# Patient Record
Sex: Female | Born: 1965 | Race: Black or African American | Hispanic: No | Marital: Married | Smoking: Never smoker
Health system: Southern US, Community
[De-identification: ages and names within clinical notes are randomized; demographics above are authoritative.]

## PROBLEM LIST (undated history)

## (undated) DIAGNOSIS — K219 Gastro-esophageal reflux disease without esophagitis: Secondary | ICD-10-CM

## (undated) DIAGNOSIS — M069 Rheumatoid arthritis, unspecified: Secondary | ICD-10-CM

## (undated) DIAGNOSIS — L309 Dermatitis, unspecified: Secondary | ICD-10-CM

## (undated) DIAGNOSIS — T8859XA Other complications of anesthesia, initial encounter: Secondary | ICD-10-CM

## (undated) DIAGNOSIS — R112 Nausea with vomiting, unspecified: Secondary | ICD-10-CM

## (undated) DIAGNOSIS — R1319 Other dysphagia: Secondary | ICD-10-CM

## (undated) DIAGNOSIS — Z9889 Other specified postprocedural states: Secondary | ICD-10-CM

## (undated) DIAGNOSIS — D649 Anemia, unspecified: Secondary | ICD-10-CM

## (undated) DIAGNOSIS — T4145XA Adverse effect of unspecified anesthetic, initial encounter: Secondary | ICD-10-CM

## (undated) DIAGNOSIS — J45909 Unspecified asthma, uncomplicated: Secondary | ICD-10-CM

## (undated) DIAGNOSIS — I10 Essential (primary) hypertension: Secondary | ICD-10-CM

## (undated) HISTORY — DX: Gastro-esophageal reflux disease without esophagitis: K21.9

## (undated) HISTORY — PX: ABDOMINAL HYSTERECTOMY: SHX81

## (undated) HISTORY — DX: Rheumatoid arthritis, unspecified: M06.9

## (undated) HISTORY — PX: COLONOSCOPY: SHX174

## (undated) HISTORY — DX: Essential (primary) hypertension: I10

## (undated) HISTORY — PX: TUBAL LIGATION: SHX77

## (undated) HISTORY — DX: Anemia, unspecified: D64.9

## (undated) HISTORY — DX: Unspecified asthma, uncomplicated: J45.909

## (undated) HISTORY — PX: TONSILLECTOMY: SUR1361

## (undated) HISTORY — PX: ESOPHAGEAL DILATION: SHX303

## (undated) HISTORY — DX: Dermatitis, unspecified: L30.9

## (undated) HISTORY — DX: Other dysphagia: R13.19

---

## 1997-10-29 ENCOUNTER — Ambulatory Visit (HOSPITAL_COMMUNITY): Admission: RE | Admit: 1997-10-29 | Discharge: 1997-10-30 | Payer: Self-pay | Admitting: *Deleted

## 1999-02-20 ENCOUNTER — Emergency Department (HOSPITAL_COMMUNITY): Admission: EM | Admit: 1999-02-20 | Discharge: 1999-02-20 | Payer: Self-pay | Admitting: Emergency Medicine

## 2000-03-31 ENCOUNTER — Emergency Department (HOSPITAL_COMMUNITY): Admission: EM | Admit: 2000-03-31 | Discharge: 2000-03-31 | Payer: Self-pay | Admitting: Emergency Medicine

## 2000-04-01 ENCOUNTER — Encounter: Payer: Self-pay | Admitting: Emergency Medicine

## 2001-08-25 ENCOUNTER — Other Ambulatory Visit: Admission: RE | Admit: 2001-08-25 | Discharge: 2001-08-25 | Payer: Self-pay | Admitting: Internal Medicine

## 2004-05-24 ENCOUNTER — Emergency Department (HOSPITAL_COMMUNITY): Admission: EM | Admit: 2004-05-24 | Discharge: 2004-05-24 | Payer: Self-pay | Admitting: Family Medicine

## 2004-11-15 ENCOUNTER — Emergency Department (HOSPITAL_COMMUNITY): Admission: EM | Admit: 2004-11-15 | Discharge: 2004-11-15 | Payer: Self-pay | Admitting: Family Medicine

## 2004-11-19 ENCOUNTER — Emergency Department (HOSPITAL_COMMUNITY): Admission: EM | Admit: 2004-11-19 | Discharge: 2004-11-19 | Payer: Self-pay | Admitting: Family Medicine

## 2005-04-29 ENCOUNTER — Emergency Department (HOSPITAL_COMMUNITY): Admission: EM | Admit: 2005-04-29 | Discharge: 2005-04-29 | Payer: Self-pay | Admitting: Emergency Medicine

## 2005-07-06 ENCOUNTER — Encounter (INDEPENDENT_AMBULATORY_CARE_PROVIDER_SITE_OTHER): Payer: Self-pay | Admitting: Specialist

## 2005-07-06 ENCOUNTER — Ambulatory Visit (HOSPITAL_COMMUNITY): Admission: RE | Admit: 2005-07-06 | Discharge: 2005-07-07 | Payer: Self-pay | Admitting: Otolaryngology

## 2005-09-09 ENCOUNTER — Emergency Department (HOSPITAL_COMMUNITY): Admission: EM | Admit: 2005-09-09 | Discharge: 2005-09-09 | Payer: Self-pay | Admitting: Family Medicine

## 2006-08-04 ENCOUNTER — Emergency Department (HOSPITAL_COMMUNITY): Admission: EM | Admit: 2006-08-04 | Discharge: 2006-08-04 | Payer: Self-pay | Admitting: Emergency Medicine

## 2007-08-10 ENCOUNTER — Emergency Department (HOSPITAL_COMMUNITY): Admission: EM | Admit: 2007-08-10 | Discharge: 2007-08-10 | Payer: Self-pay | Admitting: Family Medicine

## 2007-11-12 ENCOUNTER — Encounter: Admission: RE | Admit: 2007-11-12 | Discharge: 2007-11-12 | Payer: Self-pay | Admitting: Internal Medicine

## 2008-11-15 ENCOUNTER — Encounter: Admission: RE | Admit: 2008-11-15 | Discharge: 2008-11-15 | Payer: Self-pay | Admitting: Internal Medicine

## 2008-11-16 ENCOUNTER — Inpatient Hospital Stay (HOSPITAL_COMMUNITY): Admission: EM | Admit: 2008-11-16 | Discharge: 2008-11-18 | Payer: Self-pay | Admitting: Family Medicine

## 2008-11-16 ENCOUNTER — Ambulatory Visit: Payer: Self-pay | Admitting: Internal Medicine

## 2008-11-17 ENCOUNTER — Encounter: Payer: Self-pay | Admitting: Internal Medicine

## 2008-11-25 ENCOUNTER — Telehealth: Payer: Self-pay | Admitting: Internal Medicine

## 2009-03-18 ENCOUNTER — Ambulatory Visit (HOSPITAL_COMMUNITY): Admission: RE | Admit: 2009-03-18 | Discharge: 2009-03-18 | Payer: Self-pay | Admitting: Interventional Radiology

## 2009-11-16 ENCOUNTER — Encounter: Admission: RE | Admit: 2009-11-16 | Discharge: 2009-11-16 | Payer: Self-pay | Admitting: Internal Medicine

## 2009-12-02 ENCOUNTER — Encounter: Admission: RE | Admit: 2009-12-02 | Discharge: 2010-03-02 | Payer: Self-pay | Admitting: Otolaryngology

## 2010-09-18 ENCOUNTER — Emergency Department (HOSPITAL_COMMUNITY)
Admission: EM | Admit: 2010-09-18 | Discharge: 2010-09-18 | Payer: Self-pay | Source: Home / Self Care | Admitting: Family Medicine

## 2010-10-07 ENCOUNTER — Other Ambulatory Visit: Payer: Self-pay | Admitting: Internal Medicine

## 2010-10-07 DIAGNOSIS — Z1239 Encounter for other screening for malignant neoplasm of breast: Secondary | ICD-10-CM

## 2010-10-17 NOTE — Progress Notes (Signed)
Summary: TRIAGE  Phone Note Call from Patient Call back at [phone]   Caller: Patient Call For: [name] Reason for Call: Talk to Nurse Summary of Call: Patient would like col results done last week at the hospital Initial call taken by: Tawni Levy,  November 25, 2008 11:46 AM  Follow-up for Phone Call        Colonoscopy and Path. report reviewed w/pt. Pt. requests I send a copy to her PCP, Dr.Shelton and OGBYN, Dr.Cousins. I will fax those. Pt. instructed to call back as needed.  Follow-up by: Laureen Ochs LPN,  November 25, 2008 1:08 PM

## 2010-10-17 NOTE — Procedures (Signed)
Summary: Colonoscopy/BX.   Colonoscopy  Procedure date:  11/17/2008  Findings:      Location:  [facility].    COLONOSCOPY PROCEDURE REPORT  PATIENT:  Julie [name], Julie [name]  MR#:  [id number] BIRTHDATE:   Feb 15, 1966, 42 yrs. old   GENDER:   female  ENDOSCOPIST:   Hedwig Morton. Juanda Chance, MD Referred by:   PROCEDURE DATE:  11/17/2008 PROCEDURE:  Colonoscopy with biopsy ASA CLASS:   Class I INDICATIONS: abdominal pain, abnormal CT of abdomen   MEDICATIONS:    Versed 10 mg, Fentanyl 100 mcg  DESCRIPTION OF PROCEDURE:   After the risks benefits and alternatives of the procedure were thoroughly explained, informed consent was obtained.  Digital rectal exam was performed and revealed no rectal masses.   The EC-[id number] ([id number]) endoscope was introduced through the anus and advanced to the terminal ileum which was intubated for a short distance, without limitations.  The quality of the prep was excellent, using MiraLax.  The instrument was then slowly withdrawn as the colon was fully examined. <<PROCEDUREIMAGES>>                <<OLD IMAGES>>  FINDINGS:  The terminal ileum appeared normal. normal TI mucosa, no evidence of inflammation, about 15 cm examined With standard forceps, biopsy was obtained and sent to pathology (see image005 and image006).  A diminutive polyp was found in the sigmoid colon. diminutive polyp at 20 cm removed With standard forceps, biopsy was obtained and sent to pathology (see image009 and image008).  This was otherwise a normal examination (see image010, image007, image003, image002, and image001).   Retroflexed views in the rectum revealed no abnormalities.    The scope was then withdrawn from the patient and the procedure completed.  COMPLICATIONS:   None  ENDOSCOPIC IMPRESSION:  1) Normal terminal ileum  2) Diminutive polyp in the sigmoid colon  3) Otherwise normal examination  s/p biopsires from the normal appearing Terminal ileum  no evidence of Crohn's  disease RECOMMENDATIONS:  continue antibiotics,  pelvic ultrasound  REPEAT EXAM:   In 10 year(s) for.   _______________________________ Hedwig Morton. Juanda Chance, MD  CC:  This report was created from the original endoscopy report, which was reviewed and signed by the above listed endoscopist.     Appended Document: Colonoscopy/BX. Recall is in IDX for 11/2018.

## 2010-11-20 ENCOUNTER — Ambulatory Visit
Admission: RE | Admit: 2010-11-20 | Discharge: 2010-11-20 | Disposition: A | Discharge status: Home / Self Care | Payer: 59 | Source: Ambulatory Visit | Attending: Internal Medicine | Admitting: Internal Medicine

## 2010-11-20 DIAGNOSIS — Z1239 Encounter for other screening for malignant neoplasm of breast: Secondary | ICD-10-CM

## 2010-12-06 ENCOUNTER — Inpatient Hospital Stay (INDEPENDENT_AMBULATORY_CARE_PROVIDER_SITE_OTHER)
Admission: RE | Admit: 2010-12-06 | Discharge: 2010-12-06 | Disposition: A | Discharge status: Home / Self Care | Payer: 59 | Source: Ambulatory Visit

## 2010-12-06 DIAGNOSIS — J019 Acute sinusitis, unspecified: Secondary | ICD-10-CM

## 2010-12-28 LAB — CBC
HCT: 31.2 % — ABNORMAL LOW (ref 36.0–46.0)
HCT: 33.3 % — ABNORMAL LOW (ref 36.0–46.0)
HCT: 33.7 % — ABNORMAL LOW (ref 36.0–46.0)
HCT: 38.7 % (ref 36.0–46.0)
Hemoglobin: 11.1 g/dL — ABNORMAL LOW (ref 12.0–15.0)
Hemoglobin: 11.5 g/dL — ABNORMAL LOW (ref 12.0–15.0)
Hemoglobin: 11.8 g/dL — ABNORMAL LOW (ref 12.0–15.0)
Hemoglobin: 13.5 g/dL (ref 12.0–15.0)
MCHC: 34.6 g/dL (ref 30.0–36.0)
MCHC: 34.9 g/dL (ref 30.0–36.0)
MCHC: 35 g/dL (ref 30.0–36.0)
MCHC: 35.4 g/dL (ref 30.0–36.0)
MCV: 83.6 fL (ref 78.0–100.0)
MCV: 84.9 fL (ref 78.0–100.0)
MCV: 84.9 fL (ref 78.0–100.0)
MCV: 86.1 fL (ref 78.0–100.0)
Platelets: 171 10*3/uL (ref 150–400)
Platelets: 178 10*3/uL (ref 150–400)
Platelets: 182 10*3/uL (ref 150–400)
Platelets: 201 10*3/uL (ref 150–400)
RBC: 3.74 MIL/uL — ABNORMAL LOW (ref 3.87–5.11)
RBC: 3.88 MIL/uL (ref 3.87–5.11)
RBC: 3.97 MIL/uL (ref 3.87–5.11)
RBC: 4.56 MIL/uL (ref 3.87–5.11)
RDW: 11.9 % (ref 11.5–15.5)
RDW: 11.9 % (ref 11.5–15.5)
RDW: 12.1 % (ref 11.5–15.5)
RDW: 12.4 % (ref 11.5–15.5)
WBC: 4.2 10*3/uL (ref 4.0–10.5)
WBC: 5.4 10*3/uL (ref 4.0–10.5)
WBC: 5.4 10*3/uL (ref 4.0–10.5)
WBC: 6.9 10*3/uL (ref 4.0–10.5)

## 2010-12-28 LAB — WET PREP, GENITAL
Clue Cells Wet Prep HPF POC: NONE SEEN
Trich, Wet Prep: NONE SEEN
WBC, Wet Prep HPF POC: NONE SEEN
Yeast Wet Prep HPF POC: NONE SEEN

## 2010-12-28 LAB — COMPREHENSIVE METABOLIC PANEL
ALT: 10 U/L (ref 0–35)
AST: 13 U/L (ref 0–37)
Albumin: 3.1 g/dL — ABNORMAL LOW (ref 3.5–5.2)
Alkaline Phosphatase: 48 U/L (ref 39–117)
BUN: 5 mg/dL — ABNORMAL LOW (ref 6–23)
CO2: 23 mEq/L (ref 19–32)
Calcium: 8.3 mg/dL — ABNORMAL LOW (ref 8.4–10.5)
Chloride: 109 mEq/L (ref 96–112)
Creatinine, Ser: 0.67 mg/dL (ref 0.4–1.2)
GFR calc Af Amer: >60 mL/min (ref >60)
GFR calc non Af Amer: >60 mL/min (ref >60)
Glucose, Bld: 109 mg/dL — ABNORMAL HIGH (ref 70–99)
Potassium: 3.8 mEq/L (ref 3.5–5.1)
Sodium: 137 mEq/L (ref 135–145)
Total Bilirubin: 0.4 mg/dL (ref 0.3–1.2)
Total Protein: 6.1 g/dL (ref 6.0–8.3)

## 2010-12-28 LAB — BASIC METABOLIC PANEL
BUN: 7 mg/dL (ref 6–23)
CO2: 23 mEq/L (ref 19–32)
Calcium: 7.9 mg/dL — ABNORMAL LOW (ref 8.4–10.5)
Chloride: 106 mEq/L (ref 96–112)
Creatinine, Ser: 0.64 mg/dL (ref 0.4–1.2)
GFR calc Af Amer: >60 mL/min (ref >60)
GFR calc non Af Amer: >60 mL/min (ref >60)
Glucose, Bld: 98 mg/dL (ref 70–99)
Potassium: 3.6 mEq/L (ref 3.5–5.1)
Sodium: 133 mEq/L — ABNORMAL LOW (ref 135–145)

## 2010-12-28 LAB — DIFFERENTIAL
Basophils Absolute: 0 10*3/uL (ref 0.0–0.1)
Basophils Relative: 0 % (ref 0–1)
Eosinophils Absolute: 0.2 10*3/uL (ref 0.0–0.7)
Eosinophils Relative: 3 % (ref 0–5)
Lymphocytes Relative: 27 % (ref 12–46)
Lymphs Abs: 1.9 10*3/uL (ref 0.7–4.0)
Monocytes Absolute: 0.4 10*3/uL (ref 0.1–1.0)
Monocytes Relative: 6 % (ref 3–12)
Neutro Abs: 4.4 10*3/uL (ref 1.7–7.7)
Neutrophils Relative %: 64 % (ref 43–77)

## 2010-12-28 LAB — POCT I-STAT, CHEM 8
BUN: 10 mg/dL (ref 6–23)
Calcium, Ion: 1.09 mmol/L — ABNORMAL LOW (ref 1.12–1.32)
Chloride: 105 mEq/L (ref 96–112)
Creatinine, Ser: 0.9 mg/dL (ref 0.4–1.2)
Glucose, Bld: 100 mg/dL — ABNORMAL HIGH (ref 70–99)
HCT: 40 % (ref 36.0–46.0)
Hemoglobin: 13.6 g/dL (ref 12.0–15.0)
Potassium: 4.5 mEq/L (ref 3.5–5.1)
Sodium: 137 mEq/L (ref 135–145)
TCO2: 25 mmol/L (ref 0–100)

## 2010-12-28 LAB — URINALYSIS, ROUTINE W REFLEX MICROSCOPIC
Bilirubin Urine: NEGATIVE
Glucose, UA: NEGATIVE mg/dL
Hgb urine dipstick: NEGATIVE
Ketones, ur: NEGATIVE mg/dL
Nitrite: NEGATIVE
Protein, ur: NEGATIVE mg/dL
Specific Gravity, Urine: 1.009 (ref 1.005–1.030)
Urobilinogen, UA: 0.2 mg/dL (ref 0.0–1.0)
pH: 7.5 (ref 5.0–8.0)

## 2010-12-28 LAB — T4, FREE: Free T4: 0.92 ng/dL (ref 0.89–1.80)

## 2010-12-28 LAB — VITAMIN C: Vitamin C: 0.7 mg/dL (ref 0.4–2.0)

## 2010-12-28 LAB — ANTI-NEUTROPHIL ANTIBODY

## 2010-12-28 LAB — GC/CHLAMYDIA PROBE AMP, GENITAL
Chlamydia, DNA Probe: NEGATIVE
GC Probe Amp, Genital: NEGATIVE

## 2010-12-28 LAB — TSH: TSH: 1.484 u[IU]/mL (ref 0.350–4.500)

## 2010-12-28 LAB — POCT PREGNANCY, URINE: Preg Test, Ur: NEGATIVE

## 2010-12-28 LAB — SEDIMENTATION RATE: Sed Rate: 14 mm/hr (ref 0–22)

## 2010-12-28 LAB — HIGH SENSITIVITY CRP: CRP, High Sensitivity: 13.7 mg/L — ABNORMAL HIGH

## 2011-01-30 NOTE — Discharge Summary (Signed)
NAME:  [name], Julie             ACCOUNT NO.:  [id number]   MEDICAL RECORD NO.:  [id number]          PATIENT TYPE:  INP   LOCATION:  6732                         FACILITY:  [facility]   PHYSICIAN:  Richarda Overlie, MD       DATE OF BIRTH:  September 15, 1966   DATE OF ADMISSION:  11/15/2008  DATE OF DISCHARGE:  11/18/2008                               DISCHARGE SUMMARY   DISCHARGE DIAGNOSES:  1. Abdominal pain likely viral gastroenteritis.  2. Ruled out for inflammatory bowel disease.  3. Uterine fibroids.  4. Allergic rhinitis.  5. Gastroesophageal reflux disease.  6. Rheumatoid arthritis.   DIAGNOSTIC STUDIES DONE:  1. Colonoscopy by Dr. Lina Sar showed a normal terminal ileum.      biopsies taken and pending.  Retroflexed views of the rectum reveal      no abnormalities.  2. Non-OB transvaginal ultrasound shows a left fundal uterine fibroid      3.3 cm in diameter, no suspicious adnexal findings, small      collapsing left ovarian follicle noted.  3. Ultrasound of the pelvis complete shows the uterine fibroid.  4. CT scan of the abdomen and pelvis shows thickening of the walls of      the terminal ileum extending onto the cecum and most compatible      with infectious or inflammatory process such as Crohn's disease;      neoplastic is felt less likely but colon cancer screening after the      patient's acute episode is recommended.  Abnormal appearance of the      terminal ileum and ileocecal valve as detailed above.  No      appendicitis noted.   SUBJECTIVE:  This is a 45 year old female with a history of rheumatoid  arthritis who presents to the ER with chief complaint of abdominal pain  for the last several days associated in the epigastrium and right upper  quadrant.  She denied any associated fever, chills or rigors.  Because  of the appearance on the CAT scan the patient was worked up for  inflammatory bowel disease.  Her Crp was elevated but the ESR was  normal.  CPR was  elevated at 13.7.  The patient did not have any  evidence of diarrhea therefore no stool studies were sent.  The patient  was evaluated by GI for possible inflammatory bowel disease and had a  colonoscopy with the above findings and was essentially negative  therefore she was subsequently evaluated for gynecologic reasons for  abdominal pain and was found to have a 3.3 cm uterine fibroid.  The  patient is advised to schedule a followup appointment with her  gynecologist next week.   Rheumatoid arthritis has remained stable and controlled during this  admission.   DISCHARGE MEDICATIONS:  1. Symbicort 150/4.5 two puffs inhaled q.12 hours.  2. Folate 1 mg p.o. daily.  3. Omeprazole 20 mg p.o. daily.  4. Nasonex two sprays per nostril 1-2 p.r.n. congestion.  5. Albuterol 1-2 inhalations q.6 hours p.r.n. wheezing.  6. Plaquenil 200 mg one p.o. b.i.d.  7. Kapidex 60 mg p.o. b.i.d.  8. Vitamin D 50,000 units one capsule 2 times a week x3 months.  9. Reglan 10 mg p.o. q.6 hours p.r.n. nausea.   FOLLOWUP:  The patient is to follow up with PCP in 5-7 days, follow up  with gynecology in 5-7 days.      Richarda Overlie, MD  Electronically Signed     NA/MEDQ  D:  11/18/2008  T:  11/18/2008  Job:  [id number]

## 2011-01-30 NOTE — H&P (Signed)
NAME:  [name], Julie             ACCOUNT NO.:  [id number]   MEDICAL RECORD NO.:  [id number]          PATIENT TYPE:  INP   LOCATION:  6732                         FACILITY:  [facility]   PHYSICIAN:  Renee Ramus, MD       DATE OF BIRTH:  [birth year]   DATE OF ADMISSION:  11/15/2008  DATE OF DISCHARGE:                              HISTORY & PHYSICAL   HISTORY OF PRESENT ILLNESS:  The patient is a 45 year old mildly obese  female with abdominal pain times several days.  Prior to admission,  the patient rates her pain currently at 10/10, says it is the worst  pain ever she had.  The patient describes her pain in her right upper  quadrant and epigastrium.  She denies any previous history of this pain.  She denies fevers, chills, night sweats, nausea, vomiting, diarrhea, or  constipation.  The patient is being admitted for further evaluation and  treatment.   PAST MEDICAL HISTORY:  1. Gastroesophageal reflux disease.  2. Obesity.  3. Seasonal allergies.  4. Rheumatoid arthritis.   SOCIAL HISTORY:  The patient has no tobacco or alcohol history.   FAMILY HISTORY:  Not available.   REVIEW OF SYSTEMS:  All other comprehensive review systems are negative.   MEDICATIONS:  The patient lists allergies to AUGMENTIN.   CURRENT MEDICATIONS:  1. Symbicort 160/4.5 two puffs inhaled q.12 h.  2. Folate 1 mg p.o. daily.  3. Omeprazole 20 mg p.o. daily.  4. Nasonex 2 sprays per nostril 1-2 a day p.r.n. congestion.  5. Albuterol 1-2 inhalations q.6 h. p.r.n. wheezes.  6. Plaquenil 200 mg 1 p.o. b.i.d.  7. Kapidex 60 mg p.o. b.i.d.  8. Vitamin D 50,000 units 1 cap 2 times a week x3 months.  The patient      has completed this regimen.   PHYSICAL EXAMINATION:  GENERAL:  This is a well-developed, well-  nourished [place] female, currently in no apparent distress.  VITAL SIGNS:  Temperature 98.5, pulse 88, respiratory rate 18, blood  pressure 122/66, she is 97% on room air.  HEENT:  No  jugular venous distention or lymphadenopathy.  Oropharynx is  clear.  Mucous membranes pink and moist.  TMs clear bilaterally.  Pupils  equal and reactive to light and accommodation.  Extraocular muscles are  intact.  CARDIOVASCULAR:  Regular rate and rhythm without murmurs, rubs, or  gallops.  PULMONARY:  Lungs are clear to auscultation bilaterally.  ABDOMEN:  Tender to palpation, but no rebound or guarding.  She has good  bowel sounds.  ABDOMEN:  Soft.  EXTREMITIES:  No clubbing, cyanosis, or edema.  She has good peripheral  pulses, dorsalis pedis, and radial arteries.  She is able to move all  extremities.  NEURO:  Cranial nerves II-XII are grossly intact.  She has no focal  neurological deficits.   LABORATORY DATA:  White count 6.9, H&H 13.6 and 40, MCV 85, and  platelets 201.  Sodium 137, potassium 4.5, chloride 105, bicarb 25, BUN  10, creatinine 0.9, and glucose 100.  UA is clear of infection and is  not concentrated.  STUDIES:  Abdominal CT scan showing thickening of the terminal ileum and  cecum with question of Crohn disease versus inflammatory process.   ASSESSMENT/PLAN:  1. Abdominal pain, question of inflammatory bowel disease.  The      patient does not have diarrhea, no white count, but she is on      Plaquenil, and this could cause her difficulty in mounting white      count.  We will obtain GI consult, and for further evaluation and      possible treatment, the patient will need biopsy to definitively      diagnose Crohn disease.  We will check pANCA, CRP, ESR, and ASCA.  2. Allergic rhinitis.  The patient is on Symbicort and Nasonex.  She      is not currently using this and has no symptoms of this right now.  3. Gastroesophageal reflux disease.  We will continue proton pump      inhibitor.  4. Mild obesity.  We will check thyroid including a TSH and free T4.  5. Rheumatoid arthritis.  We will continue Plaquenil and folate.  6. Disposition.  The patient is full  code.   H&P was constructed by reviewing past medical history, conferring with  emergency medical room physician, and reviewing the emergency medical  record.  Time spent 1 hour.      Renee Ramus, MD  Electronically Signed     JF/MEDQ  D:  11/16/2008  T:  11/17/2008  Job:  [id number]   cc:   Merlene Laughter. Renae Gloss, M.D.

## 2011-02-02 NOTE — Op Note (Signed)
NAME:  Julie [name], Julie [name]             ACCOUNT NO.:  [id number]   MEDICAL RECORD NO.:  [id number]          PATIENT TYPE:  OIB   LOCATION:  3315                         FACILITY:  [facility]   PHYSICIAN:  Jefry H. Pollyann Kennedy, MD     DATE OF BIRTH:  08/27/1966   DATE OF PROCEDURE:  07/06/2005  DATE OF DISCHARGE:                                 OPERATIVE REPORT   PREOPERATIVE DIAGNOSES:  1.  Obstructive sleep apnea syndrome.  2.  Inability to tolerate continuous positive airway pressure.  3.  Nasal septal deviation.  4.  Tonsil hypertrophy.  5.  Inferior turbinate hypertrophy.   POSTOPERATIVE DIAGNOSES:  1.  Obstructive sleep apnea syndrome.  2.  Inability to tolerate continuous positive airway pressure.  3.  Nasal septal deviation.  4.  Tonsil hypertrophy.  5.  Inferior turbinate hypertrophy.   PROCEDURE:  1.  Tonsillectomy.  2.  Uvulopalatopharyngoplasty.  3.  Nasal septoplasty.  4.  Submucous resection, inferior turbinates, bilateral.   SURGEON:  Jefry H. Pollyann Kennedy, MD   ANESTHESIA:  General endotracheal anesthesia was used.   COMPLICATIONS:  No complications.   FINDINGS:  Moderately large tonsils with redundant soft tissue involving the  anterior fossal arches and the free edge of the soft palate and uvula.  Marked leftward nasal septal deviation with bilateral turbinate enlargement,  thickened bone and inferior meatus obstruction.   REFERRING PHYSICIAN:  Kellie Shropshire, MD   BLOOD LOSS:  30 mL.   HISTORY:  This is a 45 year old who was diagnosed with obstructive sleep  apnea syndrome and underwent a trial of CPAP, but was unable to tolerate  that.  Risks, benefits, alternatives and complications of the procedure were  explained to the patient, who seemed to understand and agreed to surgery.   PROCEDURE:  The patient was taken to the operating room and placed on the  operating table in a supine position.  Following induction of general  endotracheal anesthesia, the table was turned  and the patient was draped in  a standard fashion.   1. TONSILLECTOMY:  A Crowe-Davis mouth gag was inserted into the oral cavity  and used to retract the tongue and mandible, and attached to the Mayo stand.  A tonsillectomy was performed using electrocautery dissection, first  incising and excising excess anterior fossal arch mucosa and submucosal  muscle along with the tonsils, dissecting in the avascular plane between the  capsule and constrictor muscles.  Multiple bleeders were encountered and  were cauterized with the suction cautery unit.   2. UVULOPALATOPHARYNGOPLASTY:  Proposed incisions were marked on the  anterior surface of the soft palate mucosa with electrocautery.  Sufficient  mucosa and muscle were left remaining for prevention of postoperative  velopharyngeal insufficiency.  The mucosa was incised through the muscular  layer including the uvula and through the posterior mucosa, slightly lower  than the anterior mucosa to facilitate closure.  The tonsils and UP-3  specimen were sent together for pathologic evaluation.  Cautery was used as  needed for control of any bleeding.  The mucosa was reapproximated starting  by tacking up the corners bilaterally  with interrupted 3-0 Vicryl.  The  lateral wound defects were reapproximated with several interrupted Vicryls  bilaterally.  The inferior aspect was left open.  A running Vicryl was used  for the soft palate free edge.  The pharynx was suctioned of blood and  secretions, irrigated with saline solution and an orogastric tube was used  to aspirate the contents of the stomach.   3. NASAL SEPTOPLASTY:  1% Xylocaine with epinephrine was infiltrated into  the septum, the columella and the inferior turbinates bilaterally.  Afrin-  soaked pledgets were placed bilaterally in the nasal cavities.  A left  hemitransfixion incision was created with a 15 scalpel and then mucosal flap  was developed on the left side using a Market researcher.  The cartilage was  incised in a vertical fashion posterior to the caudal strut and a similar  flap was developed down the right side.  This posterior cartilage was all  resected, which contained the majority of the leftward deviation.  Large  fragments of vomerian bone and maxillary crest bones were also resected.  After good thinning and mutilization of the septum was accomplished, the  mucosal incision was reapproximated with a chromic suture.  The septal flaps  were then quilted with plain gut.   4. SUBMUCOUS RESECTION OF INFERIOR TURBINATES BILATERALLY:  The leading edge  of the inferior turbinates was incised in a vertical fashion with a 15  scalpel.  A Freer elevator was used to elevate the mucosa off of the  turbinate bone and large fragments of bone were resected with Lenoria Chime  forceps.  The turbinate remnants were outfractured with a Therapist, nutritional.  Significantly improved airways occurred from of these procedures.  The nasal  cavities were suctioned and then packed with rolled-up Telfa coated with  bacitracin ointment.  The patient was then awakened, extubated and  transferred to recovery in stable condition.      Jefry H. Pollyann Kennedy, MD  Electronically Signed     JHR/MEDQ  D:  07/06/2005  T:  07/07/2005  Job:  [id number]   cc:   Merlene Laughter. Renae Gloss, M.D.  Fax: [phone]

## 2011-06-26 LAB — POCT RAPID STREP A: Streptococcus, Group A Screen (Direct): NEGATIVE

## 2011-09-25 ENCOUNTER — Other Ambulatory Visit: Payer: Self-pay | Admitting: Internal Medicine

## 2011-09-25 DIAGNOSIS — Z1231 Encounter for screening mammogram for malignant neoplasm of breast: Secondary | ICD-10-CM

## 2011-11-21 ENCOUNTER — Ambulatory Visit
Admission: RE | Admit: 2011-11-21 | Discharge: 2011-11-21 | Disposition: A | Discharge status: Home / Self Care | Payer: 59 | Source: Ambulatory Visit | Attending: Internal Medicine | Admitting: Internal Medicine

## 2011-11-21 DIAGNOSIS — Z1231 Encounter for screening mammogram for malignant neoplasm of breast: Secondary | ICD-10-CM

## 2012-07-31 ENCOUNTER — Other Ambulatory Visit: Payer: Self-pay | Admitting: Internal Medicine

## 2012-07-31 DIAGNOSIS — Z1231 Encounter for screening mammogram for malignant neoplasm of breast: Secondary | ICD-10-CM

## 2012-11-21 ENCOUNTER — Ambulatory Visit: Payer: 59

## 2012-11-25 ENCOUNTER — Ambulatory Visit
Admission: RE | Admit: 2012-11-25 | Discharge: 2012-11-25 | Disposition: A | Discharge status: Home / Self Care | Payer: 59 | Source: Ambulatory Visit | Attending: Internal Medicine | Admitting: Internal Medicine

## 2012-11-25 DIAGNOSIS — Z1231 Encounter for screening mammogram for malignant neoplasm of breast: Secondary | ICD-10-CM

## 2013-10-23 ENCOUNTER — Other Ambulatory Visit: Payer: Self-pay

## 2013-10-23 DIAGNOSIS — Z1231 Encounter for screening mammogram for malignant neoplasm of breast: Secondary | ICD-10-CM

## 2013-11-27 ENCOUNTER — Ambulatory Visit
Admission: RE | Admit: 2013-11-27 | Discharge: 2013-11-27 | Disposition: A | Discharge status: Home / Self Care | Payer: 59 | Source: Ambulatory Visit

## 2013-11-27 DIAGNOSIS — Z1231 Encounter for screening mammogram for malignant neoplasm of breast: Secondary | ICD-10-CM

## 2014-06-23 ENCOUNTER — Telehealth: Payer: Self-pay | Admitting: *Deleted

## 2014-06-23 ENCOUNTER — Encounter: Payer: Self-pay | Admitting: Podiatry

## 2014-06-23 ENCOUNTER — Ambulatory Visit (INDEPENDENT_AMBULATORY_CARE_PROVIDER_SITE_OTHER): Payer: 59

## 2014-06-23 ENCOUNTER — Ambulatory Visit (INDEPENDENT_AMBULATORY_CARE_PROVIDER_SITE_OTHER): Payer: 59 | Admitting: Podiatry

## 2014-06-23 VITALS — BP 128/88 | HR 96 | Resp 12

## 2014-06-23 DIAGNOSIS — M2040 Other hammer toe(s) (acquired), unspecified foot: Secondary | ICD-10-CM

## 2014-06-23 DIAGNOSIS — R52 Pain, unspecified: Secondary | ICD-10-CM

## 2014-06-23 NOTE — Progress Notes (Signed)
   Subjective:    Patient ID: Julie [name], female    DOB: 1965-12-06, 48 y.o.   MRN: [id number]  HPI N-SORE L-B/L 5TH TOES D-2 YEARS O-SLOWLY C-WORSE A-SHOES, SOCKS T-Gelcap toe cover   Review of Systems  Allergic/Immunologic: Positive for environmental allergies and food allergies.  All other systems reviewed and are negative.      Objective:   Physical Exam  Orientated x3  Vascular: DP and PT pulses 2/4 bilaterally  Neurological: Ankle reflex equal and reactive bilaterally  Dermatological: Fibrous reactive tissue lateral fifth toe left Minimal fibrous reactive tissue fifth toe right  Musculoskeletal Pes planus bilaterally Adductovarus rotation of fourth and fifth toes bilaterally No restriction ankle, subtalar, midtarsal, MPJs, interphalangeal joints bilaterally   X-ray examination right foot weightbearing  Intact bony structure without fracture and/or dislocation  Posterior calcaneal spur  Adductovarus rotation fourth and fifth toes  No evidence of rheumatoid arthritic changes noted  Radiographic impression:  No acute bony abnormality noted in the right foot   X-ray examination weightbearing left foot  Intact bony structure without a fracture and/or dislocation noted  Hammertoe second  Posterior calcaneal spur  Joint spaces appear within normal limits without any obvious rheumatoid changes  Radiographic impression:  No acute bony abnormality noted left foot     Assessment & Plan:   Assessment: Satisfactory neurovascular status 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 Treatment could include shoe modification, soft shoes with wide toe box, protective padding, or surgical treatment.  Patient is opting for protective padding and shoe modification. I showed patient how to put them nonmedicated foam toe  pad on the fifth toe and attach with Coflex tape.  Reappoint at patient's request

## 2014-06-23 NOTE — Patient Instructions (Signed)
Hammer Toes Hammer toes is a condition in which one or more of your toes is permanently flexed. CAUSES  This happens when a muscle imbalance or abnormal bone length makes your small toes buckle. This causes the toe joint to contract and the strong cord-like bands that attach muscles to the bones (tendons) in your toes to shorten.  SIGNS AND SYMPTOMS  Common symptoms of flexible hammer toes include:   A buildup of skin cells (corns). Corns occur where boney bumps come in frequent contact with hard surfaces. For example, where your shoes press and rub.  Irritation.  Inflammation.  Pain.  Limited motion in your toes. DIAGNOSIS  Hammer toes are diagnosed through a physical exam of your toes. During the exam, your health care provider may try to reproduce your symptoms by manipulating your foot. Often, X-ray exams are done to determine the degree of deformity and to make sure that the cause is not a fracture.  TREATMENT  Hammer toes can be treated with corrective surgery. There are several types of surgical procedures that can treat hammer toes. The most common procedures include:  Arthroplasty--A portion of the joint is surgically removed and your toe is straightened. The gap fills in with fibrous tissue. This procedure helps treat pain and deformity and helps restore function.  Fusion--Cartilage between the two bones of the affected joint is taken out and the bones fuse together into one longer bone. This helps keep your toe stable and reduces pain but leaves your toe stiff, yet straight.  Implantation--A portion of your bone is removed and replaced with an implant to restore motion.  Flexor tendon transfers--This procedure repositions the tendons that curl the toes down (flexor tendons). This may be done to release the deforming force that causes your toe to buckle. Several of these procedures require fixing your toe with a pin that is visible at the tip of your toe. The pin keeps the toe  straight during healing. Your health care provider will remove the pin usually within 4-8 weeks after the procedure.  Document Released: 08/31/2000 Document Revised: 09/08/2013 Document Reviewed: 05/11/2013 Exit[employer] Patient Information 2015 [employer]. This information is not intended to replace advice given to you by your health care provider. Make sure you discuss any questions you have with your health care provider.  

## 2014-06-23 NOTE — Telephone Encounter (Signed)
I just left there and was looking at my paperwork.  One of my medications is incorrect.  It's Methocarbamol, I'm not on that medication.  I take Methotrexate and it's an injection.  If you could, give me a call.  It will be greatly appreciated, thank you.  I called and left her a message that I deleted the Methocarbamol.  I need to know the dosage for the Methotrexate and how often.  Please give me a call back.

## 2014-06-23 NOTE — Telephone Encounter (Signed)
I take Methotrexate 0.8 injection, once a week on Sunday.  If you need to give me a call, feel free to do so.

## 2014-06-24 ENCOUNTER — Encounter: Payer: Self-pay | Admitting: Podiatry

## 2014-08-03 ENCOUNTER — Other Ambulatory Visit: Payer: Self-pay | Admitting: Allergy and Immunology

## 2014-08-03 ENCOUNTER — Ambulatory Visit
Admission: RE | Admit: 2014-08-03 | Discharge: 2014-08-03 | Disposition: A | Discharge status: Home / Self Care | Payer: 59 | Source: Ambulatory Visit | Attending: Allergy and Immunology | Admitting: Allergy and Immunology

## 2014-08-03 DIAGNOSIS — R52 Pain, unspecified: Secondary | ICD-10-CM

## 2014-11-08 ENCOUNTER — Other Ambulatory Visit: Payer: Self-pay

## 2014-11-08 DIAGNOSIS — Z1231 Encounter for screening mammogram for malignant neoplasm of breast: Secondary | ICD-10-CM

## 2014-11-22 ENCOUNTER — Ambulatory Visit
Admission: RE | Admit: 2014-11-22 | Discharge: 2014-11-22 | Disposition: A | Discharge status: Home / Self Care | Payer: 59 | Source: Ambulatory Visit | Attending: Allergy and Immunology | Admitting: Allergy and Immunology

## 2014-11-22 ENCOUNTER — Other Ambulatory Visit: Payer: Self-pay | Admitting: Allergy and Immunology

## 2014-11-22 DIAGNOSIS — R05 Cough: Secondary | ICD-10-CM

## 2014-11-22 DIAGNOSIS — R059 Cough, unspecified: Secondary | ICD-10-CM

## 2014-11-30 ENCOUNTER — Ambulatory Visit
Admission: RE | Admit: 2014-11-30 | Discharge: 2014-11-30 | Disposition: A | Discharge status: Home / Self Care | Payer: 59 | Source: Ambulatory Visit

## 2014-11-30 DIAGNOSIS — Z1231 Encounter for screening mammogram for malignant neoplasm of breast: Secondary | ICD-10-CM

## 2015-01-07 ENCOUNTER — Other Ambulatory Visit (HOSPITAL_COMMUNITY): Payer: Self-pay | Admitting: Rheumatology

## 2015-01-07 DIAGNOSIS — M5442 Lumbago with sciatica, left side: Secondary | ICD-10-CM

## 2015-01-24 ENCOUNTER — Ambulatory Visit (HOSPITAL_COMMUNITY)
Admission: RE | Admit: 2015-01-24 | Discharge: 2015-01-24 | Disposition: A | Discharge status: Home / Self Care | Payer: 59 | Source: Ambulatory Visit | Attending: Rheumatology | Admitting: Rheumatology

## 2015-01-24 DIAGNOSIS — D259 Leiomyoma of uterus, unspecified: Secondary | ICD-10-CM | POA: Insufficient documentation

## 2015-01-24 DIAGNOSIS — M545 Low back pain: Secondary | ICD-10-CM | POA: Diagnosis present

## 2015-01-24 DIAGNOSIS — M5442 Lumbago with sciatica, left side: Secondary | ICD-10-CM

## 2015-05-09 ENCOUNTER — Other Ambulatory Visit: Payer: Self-pay | Admitting: Obstetrics and Gynecology

## 2015-05-10 ENCOUNTER — Encounter: Payer: 59 | Attending: Dietician | Admitting: Dietician

## 2015-05-13 ENCOUNTER — Encounter (HOSPITAL_COMMUNITY)
Admission: RE | Admit: 2015-05-13 | Discharge: 2015-05-13 | Disposition: A | Discharge status: Home / Self Care | Payer: 59 | Source: Ambulatory Visit | Attending: Obstetrics and Gynecology | Admitting: Obstetrics and Gynecology

## 2015-05-13 ENCOUNTER — Encounter (HOSPITAL_COMMUNITY): Payer: Self-pay

## 2015-05-13 DIAGNOSIS — N920 Excessive and frequent menstruation with regular cycle: Secondary | ICD-10-CM | POA: Insufficient documentation

## 2015-05-13 DIAGNOSIS — D259 Leiomyoma of uterus, unspecified: Secondary | ICD-10-CM | POA: Insufficient documentation

## 2015-05-13 DIAGNOSIS — Z01818 Encounter for other preprocedural examination: Secondary | ICD-10-CM | POA: Diagnosis present

## 2015-05-13 LAB — BASIC METABOLIC PANEL
Anion gap: 7 (ref 5–15)
BUN: 11 mg/dL (ref 6–20)
CO2: 28 mmol/L (ref 22–32)
Calcium: 9 mg/dL (ref 8.9–10.3)
Chloride: 103 mmol/L (ref 101–111)
Creatinine, Ser: 0.6 mg/dL (ref 0.44–1.00)
GFR calc Af Amer: >60 mL/min (ref >60)
GFR calc non Af Amer: >60 mL/min (ref >60)
Glucose, Bld: 95 mg/dL (ref 65–99)
Potassium: 3.8 mmol/L (ref 3.5–5.1)
Sodium: 138 mmol/L (ref 135–145)

## 2015-05-13 LAB — CBC
HCT: 37.1 % (ref 36.0–46.0)
Hemoglobin: 12.5 g/dL (ref 12.0–15.0)
MCH: 28.3 pg (ref 26.0–34.0)
MCHC: 33.7 g/dL (ref 30.0–36.0)
MCV: 84.1 fL (ref 78.0–100.0)
Platelets: 212 10*3/uL (ref 150–400)
RBC: 4.41 MIL/uL (ref 3.87–5.11)
RDW: 12.2 % (ref 11.5–15.5)
WBC: 7.2 10*3/uL (ref 4.0–10.5)

## 2015-05-13 NOTE — Patient Instructions (Addendum)
   Your procedure is scheduled on: SEPT 7 AT 1230PM  Enter through the Main Entrance of [facility] at: [place] up the phone at the desk and dial [phone] and inform us of your arrival.  Please call this number if you have any problems the morning of surgery: [phone]  Remember: DO NOT EAT OR DRINK LIQUIDS AFTER MIDNIGHT SEPT 6 (TUESDAY) Take  Your medicines day of surgery as your normally do...  Do not wear jewelry, make-up, or FINGER nail polish No metal in your hair or on your body. Do not wear lotions, powders, perfumes.  You may wear deodorant.  Do not bring valuables to the hospital. Contacts, dentures or bridgework may not be worn into surgery.  Leave suitcase in the car. After Surgery it may be brought to your room. For patients being admitted to the hospital, checkout time is 11:00am the day of discharge.

## 2015-05-24 MED ORDER — CLINDAMYCIN PHOSPHATE 900 MG/50ML IV SOLN
900.0000 mg | INTRAVENOUS | Status: AC
  Administered 2015-05-25: 900 mg via INTRAVENOUS
  Filled 2015-05-24: qty 50

## 2015-05-24 MED ORDER — CIPROFLOXACIN IN D5W 400 MG/200ML IV SOLN
400.0000 mg | INTRAVENOUS | Status: AC
  Administered 2015-05-25: 400 mg via INTRAVENOUS
  Filled 2015-05-24: qty 200

## 2015-05-25 ENCOUNTER — Ambulatory Visit (HOSPITAL_COMMUNITY): Payer: 59 | Admitting: Anesthesiology

## 2015-05-25 ENCOUNTER — Encounter (HOSPITAL_COMMUNITY): Payer: Self-pay | Admitting: Certified Registered Nurse Anesthetist

## 2015-05-25 ENCOUNTER — Encounter (HOSPITAL_COMMUNITY)
Admission: RE | Disposition: A | Discharge status: Home / Self Care | Payer: Self-pay | Source: Ambulatory Visit | Attending: Obstetrics and Gynecology

## 2015-05-25 ENCOUNTER — Ambulatory Visit (HOSPITAL_COMMUNITY)
Admission: RE | Admit: 2015-05-25 | Discharge: 2015-05-26 | Disposition: A | Discharge status: Home / Self Care | Payer: 59 | Source: Ambulatory Visit | Attending: Obstetrics and Gynecology | Admitting: Obstetrics and Gynecology

## 2015-05-25 DIAGNOSIS — E559 Vitamin D deficiency, unspecified: Secondary | ICD-10-CM | POA: Diagnosis not present

## 2015-05-25 DIAGNOSIS — N920 Excessive and frequent menstruation with regular cycle: Secondary | ICD-10-CM | POA: Insufficient documentation

## 2015-05-25 DIAGNOSIS — M069 Rheumatoid arthritis, unspecified: Secondary | ICD-10-CM | POA: Diagnosis not present

## 2015-05-25 DIAGNOSIS — N800 Endometriosis of uterus: Secondary | ICD-10-CM | POA: Insufficient documentation

## 2015-05-25 DIAGNOSIS — Z91013 Allergy to seafood: Secondary | ICD-10-CM | POA: Insufficient documentation

## 2015-05-25 DIAGNOSIS — N831 Corpus luteum cyst: Secondary | ICD-10-CM | POA: Insufficient documentation

## 2015-05-25 DIAGNOSIS — D251 Intramural leiomyoma of uterus: Secondary | ICD-10-CM | POA: Diagnosis not present

## 2015-05-25 DIAGNOSIS — J45909 Unspecified asthma, uncomplicated: Secondary | ICD-10-CM | POA: Diagnosis not present

## 2015-05-25 DIAGNOSIS — Z90710 Acquired absence of both cervix and uterus: Secondary | ICD-10-CM | POA: Diagnosis present

## 2015-05-25 DIAGNOSIS — Z881 Allergy status to other antibiotic agents status: Secondary | ICD-10-CM | POA: Diagnosis not present

## 2015-05-25 DIAGNOSIS — Z886 Allergy status to analgesic agent status: Secondary | ICD-10-CM | POA: Diagnosis not present

## 2015-05-25 DIAGNOSIS — Z7952 Long term (current) use of systemic steroids: Secondary | ICD-10-CM | POA: Insufficient documentation

## 2015-05-25 HISTORY — PX: OVARIAN CYST REMOVAL: SHX89

## 2015-05-25 HISTORY — DX: Adverse effect of unspecified anesthetic, initial encounter: T41.45XA

## 2015-05-25 HISTORY — DX: Other specified postprocedural states: R11.2

## 2015-05-25 HISTORY — PX: BILATERAL SALPINGECTOMY: SHX5743

## 2015-05-25 HISTORY — DX: Other specified postprocedural states: Z98.890

## 2015-05-25 HISTORY — DX: Other complications of anesthesia, initial encounter: T88.59XA

## 2015-05-25 SURGERY — ROBOTIC ASSISTED TOTAL HYSTERECTOMY WITH SALPINGECTOMY
Anesthesia: General | Site: Abdomen | Laterality: Right

## 2015-05-25 MED ORDER — METHYLPREDNISOLONE SODIUM SUCC 125 MG IJ SOLR
INTRAMUSCULAR | Status: AC
  Filled 2015-05-25: qty 2

## 2015-05-25 MED ORDER — GLYCOPYRROLATE 0.2 MG/ML IJ SOLN
INTRAMUSCULAR | Status: AC
  Filled 2015-05-25: qty 3

## 2015-05-25 MED ORDER — BUPIVACAINE HCL (PF) 0.25 % IJ SOLN
INTRAMUSCULAR | Status: AC
  Filled 2015-05-25: qty 30

## 2015-05-25 MED ORDER — LACTATED RINGERS IR SOLN
Status: DC | PRN
  Administered 2015-05-25: 3000 mL

## 2015-05-25 MED ORDER — OXYCODONE-ACETAMINOPHEN 5-325 MG PO TABS
1.0000 | ORAL_TABLET | ORAL | Status: DC | PRN
  Administered 2015-05-26 (×2): 1 via ORAL
  Filled 2015-05-25 (×2): qty 1

## 2015-05-25 MED ORDER — HYDROMORPHONE HCL 1 MG/ML IJ SOLN
0.2000 mg | INTRAMUSCULAR | Status: DC | PRN
  Administered 2015-05-25: 0.6 mg via INTRAVENOUS
  Filled 2015-05-25: qty 1

## 2015-05-25 MED ORDER — HYDROMORPHONE HCL 1 MG/ML IJ SOLN
INTRAMUSCULAR | Status: AC
  Administered 2015-05-25: 0.25 mg via INTRAVENOUS
  Filled 2015-05-25: qty 1

## 2015-05-25 MED ORDER — ONDANSETRON HCL 4 MG/2ML IJ SOLN
INTRAMUSCULAR | Status: DC | PRN
  Administered 2015-05-25: 4 mg via INTRAVENOUS

## 2015-05-25 MED ORDER — METHYLPREDNISOLONE SODIUM SUCC 125 MG IJ SOLR
100.0000 mg | Freq: Three times a day (TID) | INTRAMUSCULAR | Status: DC
  Administered 2015-05-25: 125 mg via INTRAVENOUS
  Administered 2015-05-25: 100 mg via INTRAVENOUS
  Filled 2015-05-25 (×5): qty 1.6

## 2015-05-25 MED ORDER — PROPOFOL 10 MG/ML IV BOLUS
INTRAVENOUS | Status: DC | PRN
  Administered 2015-05-25: 150 mg via INTRAVENOUS

## 2015-05-25 MED ORDER — ONDANSETRON HCL 4 MG/2ML IJ SOLN: 4.0000 mg | Freq: Four times a day (QID) | INTRAMUSCULAR | Status: DC | PRN

## 2015-05-25 MED ORDER — ACETAMINOPHEN 160 MG/5ML PO SOLN
975.0000 mg | Freq: Once | ORAL | Status: AC
  Administered 2015-05-25: 975 mg via ORAL

## 2015-05-25 MED ORDER — FOLIC ACID 1 MG PO TABS
1.0000 mg | ORAL_TABLET | Freq: Every day | ORAL | Status: DC
  Administered 2015-05-26 (×2): 1 mg via ORAL
  Filled 2015-05-25 (×3): qty 1

## 2015-05-25 MED ORDER — SCOPOLAMINE 1 MG/3DAYS TD PT72
MEDICATED_PATCH | TRANSDERMAL | Status: AC
  Filled 2015-05-25: qty 1

## 2015-05-25 MED ORDER — NEOSTIGMINE METHYLSULFATE 10 MG/10ML IV SOLN
INTRAVENOUS | Status: DC | PRN
  Administered 2015-05-25: 4 mg via INTRAVENOUS

## 2015-05-25 MED ORDER — SODIUM CHLORIDE 0.9 % IJ SOLN
INTRAMUSCULAR | Status: AC
  Filled 2015-05-25: qty 50

## 2015-05-25 MED ORDER — MENTHOL 3 MG MT LOZG
1.0000 | LOZENGE | OROMUCOSAL | Status: DC | PRN
Start: 2015-05-25 — End: 2015-05-26

## 2015-05-25 MED ORDER — FENTANYL CITRATE (PF) 100 MCG/2ML IJ SOLN
INTRAMUSCULAR | Status: DC | PRN
  Administered 2015-05-25 (×5): 50 ug via INTRAVENOUS

## 2015-05-25 MED ORDER — DEXTROSE IN LACTATED RINGERS 5 % IV SOLN
INTRAVENOUS | Status: DC
  Administered 2015-05-25: 20:00:00 via INTRAVENOUS

## 2015-05-25 MED ORDER — MIDAZOLAM HCL 2 MG/2ML IJ SOLN
INTRAMUSCULAR | Status: DC | PRN
  Administered 2015-05-25: 2 mg via INTRAVENOUS

## 2015-05-25 MED ORDER — ONDANSETRON HCL 4 MG PO TABS: 4.0000 mg | ORAL_TABLET | Freq: Four times a day (QID) | ORAL | Status: DC | PRN

## 2015-05-25 MED ORDER — PANTOPRAZOLE SODIUM 40 MG PO TBEC
40.0000 mg | DELAYED_RELEASE_TABLET | Freq: Every day | ORAL | Status: DC
  Administered 2015-05-26 (×2): 40 mg via ORAL
  Filled 2015-05-25 (×2): qty 1

## 2015-05-25 MED ORDER — FENTANYL CITRATE (PF) 250 MCG/5ML IJ SOLN
INTRAMUSCULAR | Status: AC
  Filled 2015-05-25: qty 25

## 2015-05-25 MED ORDER — LIDOCAINE HCL (CARDIAC) 20 MG/ML IV SOLN
INTRAVENOUS | Status: DC | PRN
  Administered 2015-05-25: 50 mg via INTRAVENOUS

## 2015-05-25 MED ORDER — STERILE WATER FOR IRRIGATION IR SOLN
Status: DC | PRN
  Administered 2015-05-25: 3000 mL

## 2015-05-25 MED ORDER — BUPIVACAINE HCL (PF) 0.25 % IJ SOLN
INTRAMUSCULAR | Status: DC | PRN
  Administered 2015-05-25: 15 mL

## 2015-05-25 MED ORDER — MIDAZOLAM HCL 2 MG/2ML IJ SOLN
INTRAMUSCULAR | Status: AC
  Filled 2015-05-25: qty 4

## 2015-05-25 MED ORDER — MONTELUKAST SODIUM 10 MG PO TABS
10.0000 mg | ORAL_TABLET | Freq: Every day | ORAL | Status: DC
  Administered 2015-05-26: 10 mg via ORAL
  Filled 2015-05-25 (×2): qty 1

## 2015-05-25 MED ORDER — HYDROMORPHONE HCL 1 MG/ML IJ SOLN
0.2500 mg | INTRAMUSCULAR | Status: DC | PRN
  Administered 2015-05-25 (×2): 0.25 mg via INTRAVENOUS

## 2015-05-25 MED ORDER — SODIUM CHLORIDE 0.9 % IV SOLN
INTRAVENOUS | Status: DC | PRN
Start: 2015-05-25 — End: 2015-05-25
  Administered 2015-05-25: 60 mL

## 2015-05-25 MED ORDER — LIDOCAINE HCL (PF) 1 % IJ SOLN
INTRAMUSCULAR | Status: AC
  Filled 2015-05-25: qty 5

## 2015-05-25 MED ORDER — SIMETHICONE 80 MG PO CHEW
80.0000 mg | CHEWABLE_TABLET | Freq: Four times a day (QID) | ORAL | Status: DC | PRN
  Administered 2015-05-26: 80 mg via ORAL
  Filled 2015-05-25: qty 1

## 2015-05-25 MED ORDER — ROCURONIUM BROMIDE 100 MG/10ML IV SOLN
INTRAVENOUS | Status: DC | PRN
  Administered 2015-05-25: 50 mg via INTRAVENOUS
  Administered 2015-05-25: 20 mg via INTRAVENOUS
  Administered 2015-05-25: 10 mg via INTRAVENOUS

## 2015-05-25 MED ORDER — LACTATED RINGERS IV SOLN
INTRAVENOUS | Status: DC
  Administered 2015-05-25 (×2): via INTRAVENOUS

## 2015-05-25 MED ORDER — KETOROLAC TROMETHAMINE 30 MG/ML IJ SOLN
INTRAMUSCULAR | Status: AC
  Filled 2015-05-25: qty 1

## 2015-05-25 MED ORDER — SCOPOLAMINE 1 MG/3DAYS TD PT72
1.0000 | MEDICATED_PATCH | Freq: Once | TRANSDERMAL | Status: DC
  Administered 2015-05-25: 1.5 mg via TRANSDERMAL

## 2015-05-25 MED ORDER — GLYCOPYRROLATE 0.2 MG/ML IJ SOLN
INTRAMUSCULAR | Status: DC | PRN
  Administered 2015-05-25: 0.6 mg via INTRAVENOUS

## 2015-05-25 MED ORDER — ONDANSETRON HCL 4 MG/2ML IJ SOLN
INTRAMUSCULAR | Status: AC
  Filled 2015-05-25: qty 2

## 2015-05-25 MED ORDER — HYDROXYCHLOROQUINE SULFATE 200 MG PO TABS
200.0000 mg | ORAL_TABLET | Freq: Every day | ORAL | Status: DC
  Administered 2015-05-26 (×2): 200 mg via ORAL
  Filled 2015-05-25 (×3): qty 1

## 2015-05-25 MED ORDER — DEXAMETHASONE SODIUM PHOSPHATE 4 MG/ML IJ SOLN
INTRAMUSCULAR | Status: AC
  Filled 2015-05-25: qty 1

## 2015-05-25 MED ORDER — ARTIFICIAL TEARS OP OINT
TOPICAL_OINTMENT | OPHTHALMIC | Status: AC
  Filled 2015-05-25: qty 3.5

## 2015-05-25 MED ORDER — ACETAMINOPHEN 160 MG/5ML PO SOLN
ORAL | Status: AC
  Administered 2015-05-25: 975 mg via ORAL
  Filled 2015-05-25: qty 40.6

## 2015-05-25 MED ORDER — PROPOFOL 10 MG/ML IV BOLUS
INTRAVENOUS | Status: AC
  Filled 2015-05-25: qty 20

## 2015-05-25 MED ORDER — ALBUTEROL SULFATE (2.5 MG/3ML) 0.083% IN NEBU: 3.0000 mL | INHALATION_SOLUTION | Freq: Four times a day (QID) | RESPIRATORY_TRACT | Status: DC | PRN

## 2015-05-25 MED ORDER — ROPIVACAINE HCL 5 MG/ML IJ SOLN
INTRAMUSCULAR | Status: AC
  Filled 2015-05-25: qty 30

## 2015-05-25 SURGICAL SUPPLY — 59 items
BARRIER ADHS 3X4 INTERCEED (GAUZE/BANDAGES/DRESSINGS) ×2 IMPLANT
BRR ADH 4X3 ABS CNTRL BYND (GAUZE/BANDAGES/DRESSINGS)
CATH FOLEY 3WAY  5CC 16FR (CATHETERS) ×1
CATH FOLEY 3WAY 5CC 16FR (CATHETERS) ×2 IMPLANT
CLOTH BEACON ORANGE TIMEOUT ST (SAFETY) ×3 IMPLANT
CONT PATH 16OZ SNAP LID 3702 (MISCELLANEOUS) ×3 IMPLANT
COVER BACK TABLE 60X90IN (DRAPES) ×6 IMPLANT
COVER TIP SHEARS 8 DVNC (MISCELLANEOUS) ×2 IMPLANT
COVER TIP SHEARS 8MM DA VINCI (MISCELLANEOUS) ×1
DECANTER SPIKE VIAL GLASS SM (MISCELLANEOUS) ×5 IMPLANT
DEVICE TROCAR PUNCTURE CLOSURE (ENDOMECHANICALS) IMPLANT
DRAPE WARM FLUID 44X44 (DRAPE) ×3 IMPLANT
DURAPREP 26ML APPLICATOR (WOUND CARE) ×3 IMPLANT
ELECT REM PT RETURN 9FT ADLT (ELECTROSURGICAL) ×3
ELECTRODE REM PT RTRN 9FT ADLT (ELECTROSURGICAL) ×2 IMPLANT
EXTRT SYSTEM ALEXIS 17CM (MISCELLANEOUS)
GAUZE VASELINE 3X9 (GAUZE/BANDAGES/DRESSINGS) IMPLANT
GLOVE BIOGEL PI IND STRL 7.0 (GLOVE) ×8 IMPLANT
GLOVE BIOGEL PI INDICATOR 7.0 (GLOVE) ×4
GLOVE ECLIPSE 6.5 STRL STRAW (GLOVE) ×3 IMPLANT
KIT ACCESSORY DA VINCI DISP (KITS) ×1
KIT ACCESSORY DVNC DISP (KITS) ×2 IMPLANT
LEGGING LITHOTOMY PAIR STRL (DRAPES) ×3 IMPLANT
LIQUID BAND (GAUZE/BANDAGES/DRESSINGS) ×3 IMPLANT
NDL SPNL 22GX7 QUINCKE BK (NEEDLE) IMPLANT
NEEDLE INSUFFLATION 150MM (ENDOMECHANICALS) ×3 IMPLANT
NEEDLE SPNL 22GX7 QUINCKE BK (NEEDLE) IMPLANT
OCCLUDER COLPOPNEUMO (BALLOONS) ×1 IMPLANT
PACK ROBOT WH (CUSTOM PROCEDURE TRAY) ×3 IMPLANT
PACK ROBOTIC GOWN (GOWN DISPOSABLE) ×3 IMPLANT
PAD POSITIONING PINK XL (MISCELLANEOUS) ×3 IMPLANT
PAD PREP 24X48 CUFFED NSTRL (MISCELLANEOUS) ×6 IMPLANT
SCISSORS LAP 5X35 DISP (ENDOMECHANICALS) IMPLANT
SET CYSTO W/LG BORE CLAMP LF (SET/KITS/TRAYS/PACK) IMPLANT
SET IRRIG TUBING LAPAROSCOPIC (IRRIGATION / IRRIGATOR) ×3 IMPLANT
SET TRI-LUMEN FLTR TB AIRSEAL (TUBING) ×1 IMPLANT
SUT VIC AB 0 CT1 36 (SUTURE) ×6 IMPLANT
SUT VICRYL 0 UR6 27IN ABS (SUTURE) ×4 IMPLANT
SUT VICRYL 4-0 PS2 18IN ABS (SUTURE) ×8 IMPLANT
SUT VLOC 180 0 9IN  GS21 (SUTURE) ×3
SUT VLOC 180 0 9IN GS21 (SUTURE) ×2 IMPLANT
SYR 50ML LL SCALE MARK (SYRINGE) ×3 IMPLANT
SYRINGE 10CC LL (SYRINGE) ×3 IMPLANT
SYSTEM CARTER THOMASON II (TROCAR) ×1 IMPLANT
SYSTEM CONTND EXTRCTN KII BLLN (MISCELLANEOUS) IMPLANT
TIP RUMI ORANGE 6.7MMX12CM (TIP) IMPLANT
TIP UTERINE 5.1X6CM LAV DISP (MISCELLANEOUS) IMPLANT
TIP UTERINE 6.7X10CM GRN DISP (MISCELLANEOUS) IMPLANT
TIP UTERINE 6.7X6CM WHT DISP (MISCELLANEOUS) IMPLANT
TIP UTERINE 6.7X8CM BLUE DISP (MISCELLANEOUS) ×1 IMPLANT
TOWEL OR 17X24 6PK STRL BLUE (TOWEL DISPOSABLE) ×9 IMPLANT
TROCAR DISP BLADELESS 8 DVNC (TROCAR) IMPLANT
TROCAR DISP BLADELESS 8MM (TROCAR) ×1
TROCAR PORT AIRSEAL 5X120 (TROCAR) ×1 IMPLANT
TROCAR XCEL 12X100 BLDLESS (ENDOMECHANICALS) ×3 IMPLANT
TROCAR XCEL NON-BLD 5MMX100MML (ENDOMECHANICALS) ×6 IMPLANT
TROCAR Z-THREAD 12X150 (TROCAR) ×3 IMPLANT
WARMER LAPAROSCOPE (MISCELLANEOUS) ×3 IMPLANT
WATER STERILE IRR 1000ML POUR (IV SOLUTION) ×9 IMPLANT

## 2015-05-25 NOTE — Transfer of Care (Signed)
Immediate Anesthesia Transfer of Care Note  Patient: Julie [name]  Procedure(s) Performed: Procedure(s): ROBOTIC ASSISTED TOTAL HYSTERECTOMY  (Bilateral) BILATERAL SALPINGECTOMY (Bilateral) OVARIAN CYSTECTOMY (Right)  Patient Location: PACU  Anesthesia Type:General  Level of Consciousness: awake, alert  and oriented  Airway & Oxygen Therapy: Patient Spontanous Breathing and Patient connected to nasal cannula oxygen  Post-op Assessment: Report given to RN and Post -op Vital signs reviewed and stable  Post vital signs: Reviewed and stable  Last Vitals:  Filed Vitals:   05/25/15 1158  BP: 142/74  Pulse: 82  Temp: 36.8 C  Resp: 18    Complications: No apparent anesthesia complications

## 2015-05-25 NOTE — Anesthesia Preprocedure Evaluation (Addendum)
Anesthesia Evaluation  Patient identified by MRN, date of birth, ID band Patient awake    Reviewed: Allergy & Precautions, H&P , Patient's Chart, lab work & pertinent test results, reviewed documented beta blocker date and time   Airway Mallampati: II  TM Distance: >3 FB Neck ROM: full    Dental no notable dental hx.    Pulmonary    Pulmonary exam normal breath sounds clear to auscultation       Cardiovascular  Rhythm:regular Rate:Normal     Neuro/Psych    GI/Hepatic   Endo/Other    Renal/GU      Musculoskeletal   Abdominal   Peds  Hematology   Anesthesia Other Findings Rheumatoid arthritis          Reproductive/Obstetrics                             Anesthesia Physical Anesthesia Plan  ASA: II  Anesthesia Plan: General   Post-op Pain Management:    Induction: Intravenous  Airway Management Planned: Oral ETT  Additional Equipment:   Intra-op Plan:   Post-operative Plan: Extubation in OR  Informed Consent: I have reviewed the patients History and Physical, chart, labs and discussed the procedure including the risks, benefits and alternatives for the proposed anesthesia with the patient or authorized representative who has indicated his/her understanding and acceptance.   Dental Advisory Given and Dental advisory given  Plan Discussed with: CRNA and Surgeon  Anesthesia Plan Comments: (  Discussed general anesthesia, including possible nausea, instrumentation of airway, sore throat,pulmonary aspiration, etc. I asked if the were any outstanding questions, or  concerns before we proceeded. )        Anesthesia Quick Evaluation

## 2015-05-25 NOTE — Anesthesia Postprocedure Evaluation (Signed)
  Anesthesia Post-op Note  Patient: [place] M Bertholf  Procedure(s) Performed: Procedure(s): ROBOTIC ASSISTED TOTAL HYSTERECTOMY  (Bilateral) BILATERAL SALPINGECTOMY (Bilateral) OVARIAN CYSTECTOMY (Right)  Patient Location: PACU  Anesthesia Type:General  Level of Consciousness: awake, alert  and oriented  Airway and Oxygen Therapy: Patient Spontanous Breathing  Post-op Pain: mild  Post-op Assessment: Post-op Vital signs reviewed and Patient's Cardiovascular Status Stable              Post-op Vital Signs: Reviewed and stable  Last Vitals:  Filed Vitals:   05/25/15 1815  BP: 119/63  Pulse: 82  Temp:   Resp: 16    Complications: No apparent anesthesia complications

## 2015-05-25 NOTE — Brief Op Note (Signed)
05/25/2015  5:37 PM  PATIENT:  Julie [name]  49 y.o. female  PRE-OPERATIVE DIAGNOSIS:  Uterine Fibroids, Menorrhagia  POST-OPERATIVE DIAGNOSIS:  Uterine Fibroids, menorrhagia, bilateral ovarian cysts  PROCEDURE:  Procedure(s): ROBOTIC ASSISTED TOTAL HYSTERECTOMY  (Bilateral) BILATERAL SALPINGECTOMY (Bilateral) OVARIAN CYSTECTOMY (Right)  SURGEON:  Surgeon(s) and Role:    * Servando Salina, MD - Primary  PHYSICIAN ASSISTANT:   ASSISTANTS: Rolitta dawson, CNM   ANESTHESIA:   general Findings: fibroid uterus, surgical separated tubes, bilateral ovarian cyst, right paratubal cyst, nl appendix, nl liver edge EBL:  Total I/O In: 1200 [I.V.:1200] Out: 525 [Urine:400; Blood:125]  BLOOD ADMINISTERED:none  DRAINS: none   LOCAL MEDICATIONS USED:  MARCAINE     SPECIMEN:  Source of Specimen:  uterus with cervix, tubes and ovarian cyst wall  DISPOSITION OF SPECIMEN:  PATHOLOGY  COUNTS:  YES  TOURNIQUET:  * No tourniquets in log *  DICTATION: .Other Dictation: Dictation Number [id number]  PLAN OF CARE: Admit for overnight observation  PATIENT DISPOSITION:  PACU - hemodynamically stable.   Delay start of Pharmacological VTE agent (>24hrs) due to surgical blood loss or risk of bleeding: no

## 2015-05-25 NOTE — Anesthesia Procedure Notes (Signed)
Procedure Name: Intubation Date/Time: 05/25/2015 1:41 PM Performed by: Bufford Spikes Pre-anesthesia Checklist: Patient identified, Timeout performed, Emergency Drugs available, Suction available and Patient being monitored Patient Re-evaluated:Patient Re-evaluated prior to inductionOxygen Delivery Method: Circle system utilized Preoxygenation: Pre-oxygenation with 100% oxygen Intubation Type: IV induction Ventilation: Mask ventilation without difficulty Laryngoscope size: light wand. Tube type: Oral Tube size: 7.0 mm Number of attempts: 1 Airway Equipment and Method: Lighted stylet Placement Confirmation: ETT inserted through vocal cords under direct vision,  breath sounds checked- equal and bilateral and positive ETCO2 Secured at: 20 cm Tube secured with: Tape Dental Injury: Teeth and Oropharynx as per pre-operative assessment

## 2015-05-26 ENCOUNTER — Encounter (HOSPITAL_COMMUNITY): Payer: Self-pay | Admitting: Obstetrics and Gynecology

## 2015-05-26 DIAGNOSIS — N920 Excessive and frequent menstruation with regular cycle: Secondary | ICD-10-CM | POA: Diagnosis not present

## 2015-05-26 LAB — BASIC METABOLIC PANEL
Anion gap: 6 (ref 5–15)
BUN: 7 mg/dL (ref 6–20)
CO2: 25 mmol/L (ref 22–32)
Calcium: 8.3 mg/dL — ABNORMAL LOW (ref 8.9–10.3)
Chloride: 105 mmol/L (ref 101–111)
Creatinine, Ser: 0.62 mg/dL (ref 0.44–1.00)
GFR calc Af Amer: >60 mL/min (ref >60)
GFR calc non Af Amer: >60 mL/min (ref >60)
Glucose, Bld: 147 mg/dL — ABNORMAL HIGH (ref 65–99)
Potassium: 4.3 mmol/L (ref 3.5–5.1)
Sodium: 136 mmol/L (ref 135–145)

## 2015-05-26 LAB — CBC
HCT: 36.1 % (ref 36.0–46.0)
Hemoglobin: 12.1 g/dL (ref 12.0–15.0)
MCH: 28.7 pg (ref 26.0–34.0)
MCHC: 33.5 g/dL (ref 30.0–36.0)
MCV: 85.5 fL (ref 78.0–100.0)
Platelets: 233 10*3/uL (ref 150–400)
RBC: 4.22 MIL/uL (ref 3.87–5.11)
RDW: 12.7 % (ref 11.5–15.5)
WBC: 15.5 10*3/uL — ABNORMAL HIGH (ref 4.0–10.5)

## 2015-05-26 MED ORDER — OXYCODONE-ACETAMINOPHEN 5-325 MG PO TABS: 1.0000 | ORAL_TABLET | ORAL | Status: DC | PRN

## 2015-05-26 NOTE — Progress Notes (Signed)
Pt is discharged in the care of husband,with N.T. Escort. Denies any pain or discomfort. States she understands all discharge instructions well Questions asked and answered. Stable. Abdominal lapsites x5 are clean and  Dry No equipment needed for home use.Stable.

## 2015-05-26 NOTE — Anesthesia Postprocedure Evaluation (Signed)
  Anesthesia Post-op Note  Patient: Julie [name]  Procedure(s) Performed: Procedure(s): ROBOTIC ASSISTED TOTAL HYSTERECTOMY  (Bilateral) BILATERAL SALPINGECTOMY (Bilateral) OVARIAN CYSTECTOMY (Right)  Patient Location: [facility]  Anesthesia Type:General  Level of Consciousness: awake, alert  and oriented  Airway and Oxygen Therapy: Patient Spontanous Breathing  Post-op Pain: none  Post-op Assessment: Post-op Vital signs reviewed and Patient's Cardiovascular Status Stable              Post-op Vital Signs: Reviewed and stable  Last Vitals:  Filed Vitals:   05/26/15 0529  BP: 123/63  Pulse: 79  Temp: 37.2 C  Resp: 18    Complications: No apparent anesthesia complications

## 2015-05-26 NOTE — Progress Notes (Signed)
Subjective: Patient reports tolerating PO, + flatus and no problems voiding.    Objective: I have reviewed patient's vital signs.  vital signs, intake and output, labs and pathology. Filed Vitals:   05/26/15 1400  BP: 102/49  Pulse: 80  Temp: 98.4 F (36.9 C)  Resp: 18   I/O last 3 completed shifts: In: 2775 [I.V.:2775] Out: 3025 [Urine:2600; Emesis/NG output:300; Blood:125]    Lab Results  Component Value Date   WBC 15.5* 05/26/2015   HGB 12.1 05/26/2015   HCT 36.1 05/26/2015   MCV 85.5 05/26/2015   PLT 233 05/26/2015   Lab Results  Component Value Date   CREATININE 0.62 05/26/2015    EXAM General: alert, cooperative and no distress Resp: clear to auscultation bilaterally Cardio: regular rate and rhythm, S1, S2 normal, no murmur, click, rub or gallop GI: soft, non-tender; bowel sounds normal; no masses,  no organomegaly and incision: clean, dry and intact Extremities: extremities normal, atraumatic, no cyanosis or edema Vaginal Bleeding: none  Assessment: s/p Procedure(s): ROBOTIC ASSISTED TOTAL HYSTERECTOMY  BILATERAL SALPINGECTOMY OVARIAN CYSTECTOMY: stable, progressing well and tolerating diet  Plan: Advance diet Encourage ambulation Discharge home  D/c instructions reviewed.  percocet script F/u 2 weeks   Empress Newmann A, MD 05/26/2015 2:20 PM    05/26/2015, 2:20 PM

## 2015-05-26 NOTE — Op Note (Signed)
NAME:  Julie [name], Julie [name]             ACCOUNT NO.:  [id number]  MEDICAL RECORD NO.:  [id number]  LOCATION:  0932                          FACILITY:  [facility]  PHYSICIAN:  Servando Salina, M.D.DATE OF BIRTH:  04-Dec-1965  DATE OF PROCEDURE:  05/25/2015 DATE OF DISCHARGE:                              OPERATIVE REPORT   PREOPERATIVE DIAGNOSIS:  Symptomatic fibroid uterus.  PROCEDURE:  Da Vinci robotic total hysterectomy, bilateral salpingectomy, right ovarian cystectomy.  POSTOPERATIVE DIAGNOSES:  Bilateral ovarian cysts.  Symptomatic uterine fibroids.  ANESTHESIA:  General.  SURGEON:  Servando Salina, MD.  ASSISTANTNada Boozer, CNM.  DESCRIPTION OF PROCEDURE:  Under adequate general anesthesia, the patient was positioned for robotic surgery.  She was placed in dorsal lithotomy position.  Examination under anesthesia revealed about a 12-week size uterus.  No adnexal masses could be initially appreciated.  The patient was sterilely prepped and draped in usual fashion.  A weighted speculum was placed in the vagina.  Sims retractor was placed anteriorly.  A very parous, large cervix was noted.  A 0 Vicryl figure-of-eight sutures were placed on the anterior and posterior lip of the cervix.  Uterus sounded to about 9 cm.  A #8 uterine manipulator was introduced into the uterine cavity.  The balloon insufflated and a large RUMI cup was attached to the cervix.  The retractor was removed.  A three-way Foley catheter was placed, and attention was then turned to the abdomen.  A supraumbilical incision was made after 0.25% Marcaine was injected.  Veress needle was introduced, tested with normal saline.  2.3 L of CO2 was insufflated at a pressure of 6.  Veress needle was then removed.  A 12-mm disposable trocar with sleeve was introduced into the abdomen.  The robotic camera was then inserted.  At that point, it was noted that there was some omental distention with air, but otherwise no  other findings were noted. A panoramic inspection was then done.  Normal liver edge noted.  The patient was placed in Trendelenburg position.  Large fibroid uterus was noted.  Bilateral ovarian cysts were noted.  The posterior cul-de-sac was noted no evidence of endometriosis seen, anterior was without any lesions.  Additional port sites were then placed, 2 on the left with a robot 8 mm, one on the right, and in the right lower quadrant.  A 12-mm AirSeal assistant port site was placed.  The robot was then docked, and in arm #1, monopolar scissors; arm #2 PK dissector; arm #3 Prograsp. I then went at the surgical console.  The procedure was started around the left.  Surgical separation of the fallopian tubes was noted bilaterally. The distal portion of that tube was grasped with a third arm, the underlying mesosalpinx was serially clamped, cauterized, and then cut until that tube was removed, and it was then removed through the assistant port.  The retroperitoneal space was opened on the left. Window was then placed.  The utero-ovarian ligament on the left was then serially clamped, cauterized, and then cut.  The peritoneum was opened anteriorly and posteriorly.  The round ligament was clamped, cauterized, and then cut.  The anterior cul-de-sac was then opened.  The tip of  the vesicouterine peritoneum was opened transversely and the bladder displaced inferiorly with sharp dissection.  The uterine vessels on the left were skeletonized, very tortuous.  The posterior leaf of the broad ligament was further dissected and brought down inferiorly.  The ureter on the left was easily seen peristalsing.  The uterine vessels were serially clamped, cauterized, and then cut.  On the contralateral side, the ureter was again seen.  The distal portion of the right tube had a paratubal cyst, with grasping of that tissue, the underlying mesosalpinx was then serially clamped, cauterized, and then cut and the  tube removed.  The retroperitoneal space on the right was then opened. Window on the peritoneum was then done, and the right utero-ovarian ligament was serially clamped, cauterized, and then cut.  The round ligament was also clamped, cauterized, and cut, and the remaining vesicouterine peritoneum was then opened fully and displaced inferiorly. Tortuous uterine vessels were noted which were gently skeletonized. However, it was noted that with the angle to grasp the uterine vessel will be difficult as the uterus became top-heavy.  Nonetheless, those vessels were cauterized but not initially cut.  The bladder was continued to be displaced inferiorly.  Due to the difficulty  somewhat getting the uterine vessels on the right adequately cauterized, to my satisfaction, I opened the cervicovaginal junction over the upper portion of the RUMI cup, transversely carried around and at the same time clamped, cauterized, and cut the uterine vessels on the right were then  then completed the cervical detachment from the vagina circumferentially.  When that was done, good hemostasis was noted.  The uterus initially appeared to be wide and broad, so using the monopolar scissors, a vertical incision was made over the midportion and partially exposing what appears to be a small fibroid.  At that point, the specimen was then removed through the vagina.  The instruments were then replaced with PK becomes the third arm, long tipped forceps, and the monopolar scissors on the right.  0 V-Loc sutures were then placed.  The vaginal cuff was then closed with 2 sutures of 0 V-Loc sutures. Intraoperatively, digital palpation of the vaginal cuff showed good approximation.  The ureters were then still seen peristalsing bilaterally.  The left ovary had two smaller cysts and the right ovary had 2 larger cysts partly due to manipulation was already leaking. These were further opened and the cyst wall was removed.   Cauterization was then corrected to achieve hemostasis.  The abdomen was then irrigated and suctioned.  Good hemostasis was noted throughout.  At that point, the procedure was felt to be complete.  The robotic instruments were removed.  The robot was undocked.  I then went back to the patient's bedside sterilely.  The abdomen was again inspected, good hemostasis was noted.  Bupivacaine was then instilled in the abdomen 60 mL for postop management.  The incisions were then subsequently closed with the fascia identified in the supraumbilical and the right lower quadrant site and closed with 0 Vicryl figure-of-eight sutures, and the skin approximated with 4-0 Vicryl subcuticular stitches.  SPECIMEN:  Uterus with cervix, tubes, and right ovarian cyst wall sent to Pathology.  The uterus weighed 241 g.  ESTIMATED BLOOD LOSS:  125.  URINE OUTPUT:  400 mL Pyridium stained fluid.  INTRAOPERATIVE FLUID:  1200 mL.  COUNT:  Sponge and instrument counts x 2 was correct.  COMPLICATIONS:  None.  The patient tolerated the procedure well, was transferred to recovery in stable condition.  Servando Salina, M.D.     Minor/MEDQ  D:  05/25/2015  T:  05/26/2015  Job:  [id number]

## 2015-05-26 NOTE — Addendum Note (Signed)
Addendum  created 05/26/15 1005 by Hewitt Blade, CRNA   Modules edited: Notes Section   Notes Section:  File: [id number]

## 2015-07-19 ENCOUNTER — Telehealth: Payer: Self-pay | Admitting: Allergy and Immunology

## 2015-07-19 NOTE — Telephone Encounter (Signed)
Please inform patient that we will give her prednisone 10mg  one tablet one time a day for 7 days and hold off on antibiotics until we she how she does on treatment.

## 2015-07-19 NOTE — Telephone Encounter (Signed)
Pt called feeling badly. conjestion in her nasal area. Pls advise. Dr. Neldon Mc doesn't have an opening until end of November

## 2015-07-19 NOTE — Telephone Encounter (Signed)
Patient informed of Dr. Bruna Potter instructions - Prednisone was left up front for [place] to pick up tomorrow. (07/20/15)

## 2015-07-19 NOTE — Telephone Encounter (Signed)
Spoke to patient and she stated that she is having a lot of congestion and sinus pressure.  She is taking montelukast, flonase, zyrtec and simply saline.  She wants to know if she can get an antibiotic.  Please advice.

## 2015-07-20 NOTE — Telephone Encounter (Signed)
Patient came to pick up the prednisone and wanted to let the nurses know she has discolored mucus now.  Please advise thanks

## 2015-07-25 ENCOUNTER — Other Ambulatory Visit: Payer: Self-pay | Admitting: Allergy and Immunology

## 2015-07-25 ENCOUNTER — Other Ambulatory Visit: Payer: Self-pay | Admitting: *Deleted

## 2015-07-25 MED ORDER — BECLOMETHASONE DIPROPIONATE 80 MCG/ACT IN AERS: 2.0000 | INHALATION_SPRAY | Freq: Two times a day (BID) | RESPIRATORY_TRACT | Status: DC

## 2015-07-25 MED ORDER — MONTELUKAST SODIUM 10 MG PO TABS: 10.0000 mg | ORAL_TABLET | Freq: Every day | ORAL | Status: DC

## 2015-07-25 MED ORDER — DEXLANSOPRAZOLE 60 MG PO CPDR: 60.0000 mg | DELAYED_RELEASE_CAPSULE | Freq: Every day | ORAL | Status: DC

## 2015-07-25 MED ORDER — RANITIDINE HCL 300 MG PO TABS: 300.0000 mg | ORAL_TABLET | Freq: Every day | ORAL | Status: DC

## 2015-07-29 ENCOUNTER — Ambulatory Visit (INDEPENDENT_AMBULATORY_CARE_PROVIDER_SITE_OTHER): Payer: 59 | Admitting: Allergy and Immunology

## 2015-07-29 ENCOUNTER — Encounter: Payer: Self-pay | Admitting: Allergy and Immunology

## 2015-07-29 VITALS — BP 140/70 | HR 92 | Temp 97.9°F | Resp 20

## 2015-07-29 DIAGNOSIS — J45901 Unspecified asthma with (acute) exacerbation: Secondary | ICD-10-CM | POA: Diagnosis not present

## 2015-07-29 DIAGNOSIS — J0100 Acute maxillary sinusitis, unspecified: Secondary | ICD-10-CM

## 2015-07-29 DIAGNOSIS — J019 Acute sinusitis, unspecified: Secondary | ICD-10-CM | POA: Insufficient documentation

## 2015-07-29 MED ORDER — LEVALBUTEROL HCL 1.25 MG/3ML IN NEBU
1.2500 mg | INHALATION_SOLUTION | Freq: Once | RESPIRATORY_TRACT | Status: AC
Start: 2015-07-29 — End: 2015-07-29
  Administered 2015-07-29: 1.25 mg via RESPIRATORY_TRACT

## 2015-07-29 MED ORDER — IPRATROPIUM BROMIDE 0.02 % IN SOLN
0.5000 mg | Freq: Once | RESPIRATORY_TRACT | Status: AC
  Administered 2015-07-29: 0.5 mg via RESPIRATORY_TRACT

## 2015-07-29 MED ORDER — METHYLPREDNISOLONE ACETATE 80 MG/ML IJ SUSP
80.0000 mg | Freq: Once | INTRAMUSCULAR | Status: AC
  Administered 2015-07-29: 80 mg via INTRAMUSCULAR

## 2015-07-29 MED ORDER — AZITHROMYCIN 1 G PO PACK: 1.0000 g | PACK | Freq: Once | ORAL | Status: DC

## 2015-07-29 MED ORDER — METHYLPREDNISOLONE ACETATE 80 MG/ML IJ SUSP: 80.0000 mg | Freq: Once | INTRAMUSCULAR | Status: DC

## 2015-07-29 NOTE — Progress Notes (Signed)
History of present illness: HPI Comments: Julie [name] is a 49 y.o. female with persistent asthma, allergic rhinoconjunctivitis, and laryngopharyngeal reflux who presents today for sick visit.  She reports that over the past 2 weeks she has experienced maxillary sinus/pain, postnasal drainage, hoarseness, nasal congestion, discolored mucus production, coughing, fevers, and chills.  She has no reflux symptom complaints today.   Assessment and plan: Acute sinusitis  Depo-Medrol 80 mg was administered in the office.  Prednisone has been provided and is to be started tomorrow as follows: 20 mg daily x 4 days, 10 mg x1 day, then stop.  A prescription has been provided for azithromycin, 500 mg on day 1 and 250 mg on days 2 through 5.  Guaifenesin 1200 mg (plus/minus pseudoephedrine 120 mg) twice daily as needed with adequate hydration. Pseudoephedrine is only to be used for short-term relief of nasal/sinus congestion. Long-term use is discouraged due to potential side effects.   Continue fluticasone nasal spray.  Nasal saline lavage as needed has been recommended along with instructions for proper administration.  The patient has been asked to contact me if her symptoms persist or progress. Otherwise, she may return for follow up in 4 months.   Asthma with acute exacerbation  Depo-Medrol injection and prednisone have been provided (as above).  Continue Qvar 80 g, 2 inhalations twice a day, montelukast 10 mg daily at bedtime, and albuterol HFA, 1-2 inhalations every 4-6 hours as needed.  To maximize pulmonary deposition, a spacer has been provided along with instructions for its proper administration with an HFA inhaler.  Subjective and objective measures of pulmonary function will be followed and the treatment plan will be adjusted accordingly.     Medications ordered this encounter: Meds ordered this encounter  Medications  . levalbuterol (XOPENEX) nebulizer solution 1.25 mg    Sig:   . ipratropium (ATROVENT) nebulizer solution 0.5 mg    Sig:   . methylPREDNISolone acetate (DEPO-MEDROL) injection 80 mg    Sig:   . azithromycin (ZITHROMAX) 1 G powder    Sig: Take 1 packet (1 g total) by mouth once.    Dispense:  1 each    Refill:  0    Diagnositics: Spirometry: FVC was 1.96 L and FEV1 is 1.69 L (80% predicted) with significant postbronchodilator improvement.    Physical examination: Blood pressure 140/70, pulse 92, temperature 97.9 F (36.6 C), temperature source Oral, resp. rate 20.  General: Alert, interactive, in no acute distress. HEENT: TMs pearly gray, turbinates markedly edematous without discharge, post-pharynx markedly erythematous. Surgically absent uvula. Neck: Supple without lymphadenopathy. Lungs: Clear to auscultation without wheezing, rhonchi or rales. CV: Normal S1, S2 without murmurs. Skin: Warm and dry, without lesions or rashes.  The following portions of the patient's history were reviewed and updated as appropriate: allergies, current medications, past family history, past medical history, past social history, past surgical history and problem list.  Outpatient medications:   Medication List       This list is accurate as of: 07/29/15  1:39 PM.  Always use your most recent med list.               albuterol 108 (90 BASE) MCG/ACT inhaler  Commonly known as:  PROVENTIL HFA;VENTOLIN HFA  Inhale 2 puffs into the lungs every 6 (six) hours as needed for wheezing or shortness of breath.     azithromycin 1 G powder  Commonly known as:  ZITHROMAX  Take 1 packet (1 g total) by mouth once.  beclomethasone 80 MCG/ACT inhaler  Commonly known as:  QVAR  Inhale 2 puffs into the lungs 2 (two) times daily.     cetirizine 10 MG tablet  Commonly known as:  ZYRTEC  Take 10 mg by mouth daily.     dexlansoprazole 60 MG capsule  Commonly known as:  DEXILANT  Take 1 capsule (60 mg total) by mouth daily.     Diclofenac Sodium 3 % Gel   Place 1 application onto the skin daily as needed (pain).     FLONASE ALLERGY RELIEF NA  Place 1 spray into the nose daily.     folic acid 1 MG tablet  Commonly known as:  FOLVITE  Take 1 mg by mouth daily.     hydroxychloroquine 200 MG tablet  Commonly known as:  PLAQUENIL  Take by mouth daily.     methotrexate 1 G injection  Commonly known as:  50 mg/ml  Inject 0.8 mg into the vein once.     montelukast 10 MG tablet  Commonly known as:  SINGULAIR  Take 1 tablet (10 mg total) by mouth at bedtime.     oxyCODONE-acetaminophen 5-325 MG tablet  Commonly known as:  PERCOCET/ROXICET  Take 1-2 tablets by mouth every 4 (four) hours as needed for severe pain (moderate to severe pain (when tolerating fluids)).     ranitidine 300 MG tablet  Commonly known as:  ZANTAC  Take 1 tablet (300 mg total) by mouth at bedtime.     Vitamin D (Ergocalciferol) 50000 UNITS Caps capsule  Commonly known as:  DRISDOL  Take 50,000 Units by mouth once a week.        Known medication allergies: Allergies  Allergen Reactions  . Aspirin Other (See Comments)    pt taking methotrexate  . Augmentin [Amoxicillin-Pot Clavulanate] Other (See Comments)    G I upset    I appreciate the opportunity to take part in this Tiki's care. Please do not hesitate to contact me with questions.  Sincerely,   R. Edgar Frisk, MD

## 2015-07-29 NOTE — Assessment & Plan Note (Addendum)
   Depo-Medrol 80 mg was administered in the office.  Prednisone has been provided and is to be started tomorrow as follows: 20 mg daily x 4 days, 10 mg x1 day, then stop.  A prescription has been provided for azithromycin, 500 mg on day 1 and 250 mg on days 2 through 5.  Guaifenesin 1200 mg (plus/minus pseudoephedrine 120 mg) twice daily as needed with adequate hydration. Pseudoephedrine is only to be used for short-term relief of nasal/sinus congestion. Long-term use is discouraged due to potential side effects.   Continue fluticasone nasal spray.  Nasal saline lavage as needed has been recommended along with instructions for proper administration.  The patient has been asked to contact me if her symptoms persist or progress. Otherwise, she may return for follow up in 4 months.

## 2015-07-29 NOTE — Assessment & Plan Note (Addendum)
   Depo-Medrol injection and prednisone have been provided (as above).  Continue Qvar 80 g, 2 inhalations twice a day, montelukast 10 mg daily at bedtime, and albuterol HFA, 1-2 inhalations every 4-6 hours as needed.  To maximize pulmonary deposition, a spacer has been provided along with instructions for its proper administration with an HFA inhaler.  Subjective and objective measures of pulmonary function will be followed and the treatment plan will be adjusted accordingly.

## 2015-07-29 NOTE — Patient Instructions (Signed)
Acute sinusitis  Depo-Medrol 80 mg was administered in the office.  Prednisone has been provided and is to be started tomorrow as follows: 20 mg daily x 4 days, 10 mg x1 day, then stop.  A prescription has been provided for azithromycin, 500 mg on day 1 and 250 mg on days 2 through 5.  Guaifenesin 1200 mg (plus/minus pseudoephedrine 120 mg) twice daily as needed with adequate hydration. Pseudoephedrine is only to be used for short-term relief of nasal/sinus congestion. Long-term use is discouraged due to potential side effects.   Continue fluticasone nasal spray.  Nasal saline lavage as needed has been recommended along with instructions for proper administration.  The patient has been asked to contact me if her symptoms persist or progress. Otherwise, she may return for follow up in 4 months.   Asthma with acute exacerbation  Depo-Medrol injection and prednisone have been provided (as above).  Continue Qvar 80 g, 2 inhalations twice a day, montelukast 10 mg daily at bedtime, and albuterol HFA, 1-2 inhalations every 4-6 hours as needed.To maximize pulmonary deposition, a spacer has been provided along with instructions for its proper administration with an HFA inhaler.  Subjective and objective measures of pulmonary function will be followed and the treatment plan will be adjusted accordingly.    Return in about 4 months (around 11/26/2015), or if symptoms worsen or fail to improve.

## 2015-07-29 NOTE — Addendum Note (Signed)
Addended by: Golda Acre C on: 07/29/2015 01:51 PM   Modules accepted: Orders

## 2015-08-22 ENCOUNTER — Other Ambulatory Visit: Payer: Self-pay | Admitting: Neurology

## 2015-08-22 MED ORDER — DEXLANSOPRAZOLE 60 MG PO CPDR: 60.0000 mg | DELAYED_RELEASE_CAPSULE | Freq: Every day | ORAL | Status: DC

## 2015-08-30 ENCOUNTER — Encounter: Payer: Self-pay | Admitting: Allergy and Immunology

## 2015-08-30 ENCOUNTER — Ambulatory Visit (INDEPENDENT_AMBULATORY_CARE_PROVIDER_SITE_OTHER): Payer: 59 | Admitting: Allergy and Immunology

## 2015-08-30 VITALS — BP 132/92 | HR 76 | Resp 18 | Ht 62.0 in

## 2015-08-30 DIAGNOSIS — J309 Allergic rhinitis, unspecified: Secondary | ICD-10-CM | POA: Diagnosis not present

## 2015-08-30 DIAGNOSIS — J329 Chronic sinusitis, unspecified: Secondary | ICD-10-CM | POA: Diagnosis not present

## 2015-08-30 DIAGNOSIS — H1010 Acute atopic conjunctivitis, unspecified eye: Secondary | ICD-10-CM | POA: Diagnosis not present

## 2015-08-30 DIAGNOSIS — J387 Other diseases of larynx: Secondary | ICD-10-CM

## 2015-08-30 DIAGNOSIS — K219 Gastro-esophageal reflux disease without esophagitis: Secondary | ICD-10-CM

## 2015-08-30 DIAGNOSIS — J4530 Mild persistent asthma, uncomplicated: Secondary | ICD-10-CM | POA: Diagnosis not present

## 2015-08-30 MED ORDER — CEFDINIR 300 MG PO CAPS: ORAL_CAPSULE | ORAL | Status: DC

## 2015-08-30 NOTE — Patient Instructions (Signed)
  1. Cefdinir 300 one tablet two times per day for 20 days.  2. Nasal saline several times per day  3. Continue Qvar 80 two inhalations two times per day  4. Continue flunisolide nasal one spray each nostril two times per day  5. Continue dexilant 60 in AM + ranitidine 300 in PM  6. Continue montelukast 10 mg one time per day  7. Continue ProAir if needed.   Return in three weeks or earlier if problem.

## 2015-08-30 NOTE — Progress Notes (Signed)
[facility]  Follow-up Note  Refering Provider: Glendale Chard, MD Primary Provider: Maximino Greenland, MD  Subjective:   Julie [name] is a 49 y.o. female who returns to the [facility] in re-evaluation of the following:  HPI Comments:  Julie returns to this clinic on 08/30/2015 in reevaluation of her asthma, allergic rhinoconjunctivitis, LPR. She was recently seen by Dr. Verlin Fester for an exacerbation of her respiratory tract disease for which she treated her with azithromycin and systemic steroids. She still continues to have problems over the course the past several weeks. She has some head fullness and constant postnasal drip and decreased ability to taste without any ugly nasal discharge or fever. She has some dizziness and unsteadiness but no obvious vertigo. Her chest does not really doing that poorly although she has a little bit of a throat clearing-like cough. She does not use need to use her bronchodilator. She continues on medical therapy including her Qvar and nasal flunisolide and montelukast as well as therapy for reflux. She also continues on methotrexate for her rheumatoid arthritis   Outpatient Encounter Prescriptions as of 08/30/2015  Medication Sig  . albuterol (PROVENTIL HFA;VENTOLIN HFA) 108 (90 BASE) MCG/ACT inhaler Inhale 2 puffs into the lungs every 6 (six) hours as needed for wheezing or shortness of breath.  . beclomethasone (QVAR) 80 MCG/ACT inhaler Inhale 2 puffs into the lungs 2 (two) times daily.  . cetirizine (ZYRTEC) 10 MG tablet Take 10 mg by mouth daily.  Marland Kitchen dexlansoprazole (DEXILANT) 60 MG capsule Take 1 capsule (60 mg total) by mouth daily.  . Diclofenac Sodium 3 % GEL Place 1 application onto the skin daily as needed (pain).  . Fluticasone Propionate (FLONASE ALLERGY RELIEF NA) Place 1 spray into the nose daily.   . folic acid (FOLVITE) 1 MG tablet Take 1 mg by mouth daily.  .  hydroxychloroquine (PLAQUENIL) 200 MG tablet Take by mouth daily.  . methotrexate (50 MG/ML) 1 G injection Inject 0.8 mg into the vein once.   . montelukast (SINGULAIR) 10 MG tablet Take 1 tablet (10 mg total) by mouth at bedtime.  . ranitidine (ZANTAC) 300 MG tablet Take 1 tablet (300 mg total) by mouth at bedtime.  . Vitamin D, Ergocalciferol, (DRISDOL) 50000 UNITS CAPS capsule Take 50,000 Units by mouth once a week.  Marland Kitchen azithromycin (ZITHROMAX) 1 G powder Take 1 packet (1 g total) by mouth once. (Patient not taking: Reported on 08/30/2015)  . cefdinir (OMNICEF) 300 MG capsule TAKE ONE TABLET TWO TIMES A DAY FOR 20 DAYS  . [DISCONTINUED] oxyCODONE-acetaminophen (PERCOCET/ROXICET) 5-325 MG per tablet Take 1-2 tablets by mouth every 4 (four) hours as needed for severe pain (moderate to severe pain (when tolerating fluids)). (Patient not taking: Reported on 07/29/2015)   No facility-administered encounter medications on file as of 08/30/2015.    Meds ordered this encounter  Medications  . cefdinir (OMNICEF) 300 MG capsule    Sig: TAKE ONE TABLET TWO TIMES A DAY FOR 20 DAYS    Dispense:  40 capsule    Refill:  0    Past Medical History  Diagnosis Date  . Rheumatoid arthritis ([place])   . Complication of anesthesia   . PONV (postoperative nausea and vomiting)     Past Surgical History  Procedure Laterality Date  . Tonsillectomy    . Tubal ligation    . Bilateral salpingectomy Bilateral 05/25/2015    Procedure: BILATERAL SALPINGECTOMY;  Surgeon:  Servando Salina, MD;  Location: [facility];  Service: Gynecology;  Laterality: Bilateral;  . Ovarian cyst removal Right 05/25/2015    Procedure: OVARIAN CYSTECTOMY;  Surgeon: Servando Salina, MD;  Location: [facility];  Service: Gynecology;  Laterality: Right;    Allergies  Allergen Reactions  . Aspirin Other (See Comments)    pt taking methotrexate  . Augmentin [Amoxicillin-Pot Clavulanate] Other (See Comments)    G I upset    Review of  Systems  Constitutional: Negative for fever, chills and fatigue.  HENT: Positive for congestion and sinus pressure. Negative for ear pain, facial swelling, hearing loss, nosebleeds, postnasal drip, rhinorrhea, sneezing, sore throat, tinnitus, trouble swallowing and voice change.   Eyes: Negative for pain, discharge, redness and itching.  Respiratory: Negative for cough, chest tightness, shortness of breath and wheezing.   Cardiovascular: Negative for chest pain and leg swelling.  Gastrointestinal: Negative for nausea, vomiting and abdominal pain.  Musculoskeletal: Negative for myalgias and arthralgias.  Skin: Negative for rash.  Allergic/Immunologic: Negative.   Neurological: Positive for dizziness. Negative for headaches.  Hematological: Negative for adenopathy.     Objective:   Filed Vitals:   08/30/15 1738  BP: 132/92  Pulse: 76  Resp: 18   Height: 5\' 2"  (157.5 cm)      Physical Exam  Constitutional: She appears well-developed and well-nourished. No distress.  Nasal raspy voice  HENT:  Head: Normocephalic and atraumatic. Head is without right periorbital erythema and without left periorbital erythema.  Right Ear: Tympanic membrane, external ear and ear canal normal. No drainage or tenderness. No foreign bodies. Tympanic membrane is not injected, not scarred, not perforated, not erythematous, not retracted and not bulging. No middle ear effusion.  Left Ear: Tympanic membrane, external ear and ear canal normal. No drainage or tenderness. No foreign bodies. Tympanic membrane is not injected, not scarred, not perforated, not erythematous, not retracted and not bulging.  No middle ear effusion.  Nose: Mucosal edema present. No rhinorrhea, nose lacerations or sinus tenderness.  No foreign bodies.  Mouth/Throat: Oropharynx is clear and moist. No oropharyngeal exudate, posterior oropharyngeal edema, posterior oropharyngeal erythema or tonsillar abscesses.  Eyes: Lids are normal. Right  eye exhibits no chemosis, no discharge and no exudate. No foreign body present in the right eye. Left eye exhibits no chemosis, no discharge and no exudate. No foreign body present in the left eye. Right conjunctiva is not injected. Left conjunctiva is not injected.  Neck: Neck supple. No tracheal tenderness present. No tracheal deviation and no edema present. No thyroid mass and no thyromegaly present.  Cardiovascular: Normal rate, regular rhythm, S1 normal and S2 normal.  Exam reveals no gallop.   No murmur heard. Pulmonary/Chest: No accessory muscle usage or stridor. No respiratory distress. She has no wheezes. She has no rhonchi. She has no rales.  Abdominal: Soft.  Lymphadenopathy:       Head (right side): No tonsillar adenopathy present.       Head (left side): No tonsillar adenopathy present.    She has no cervical adenopathy.  Neurological: She is alert.  Skin: No rash noted. She is not diaphoretic.  Psychiatric: She has a normal mood and affect. Her behavior is normal.    Diagnostics:    Spirometry was performed and demonstrated an FEV1 of 1.85 at 87 % of predicted.  The patient had an Asthma Control Test with the following results: ACT Total Score: 13.    Assessment and Plan:   1. Mild persistent asthma, uncomplicated  2. LPRD (laryngopharyngeal reflux disease)   3. Allergic rhinoconjunctivitis   4. Chronic sinusitis, unspecified location      1. Cefdinir 300 one tablet two times per day for 20 days.  2. Nasal saline several times per day  3. Continue Qvar 80 two inhalations two times per day  4. Continue flunisolide nasal one spray each nostril two times per day  5. Continue dexilant 60 in AM + ranitidine 300 in PM  6. Continue montelukast 10 mg one time per day  7. Continue ProAir if needed.   8. Return in three weeks or earlier if problem.   Julie may have a persistent sinus infection that's driving a lot of her respiratory tract symptoms and we'll treat  her with Cefdinir for the next 20 days while continuing to have her use her anti-inflammatory medications and therapy directed against reflux. She will keep in contact with me noting her response to this approach and we'll make a decision about how to proceed pending her response.   Allena Katz, MD [place]

## 2015-09-06 ENCOUNTER — Other Ambulatory Visit: Payer: Self-pay | Admitting: *Deleted

## 2015-09-06 MED ORDER — MONTELUKAST SODIUM 10 MG PO TABS: 10.0000 mg | ORAL_TABLET | Freq: Every day | ORAL | Status: DC

## 2015-09-06 MED ORDER — RANITIDINE HCL 300 MG PO TABS: 300.0000 mg | ORAL_TABLET | Freq: Every day | ORAL | Status: DC

## 2015-09-20 ENCOUNTER — Encounter: Payer: Self-pay | Admitting: Allergy and Immunology

## 2015-09-20 ENCOUNTER — Ambulatory Visit (INDEPENDENT_AMBULATORY_CARE_PROVIDER_SITE_OTHER): Payer: 59 | Admitting: Allergy and Immunology

## 2015-09-20 VITALS — BP 112/72 | HR 64 | Resp 20

## 2015-09-20 DIAGNOSIS — J3089 Other allergic rhinitis: Secondary | ICD-10-CM | POA: Insufficient documentation

## 2015-09-20 DIAGNOSIS — J387 Other diseases of larynx: Secondary | ICD-10-CM | POA: Diagnosis not present

## 2015-09-20 DIAGNOSIS — K219 Gastro-esophageal reflux disease without esophagitis: Secondary | ICD-10-CM | POA: Insufficient documentation

## 2015-09-20 DIAGNOSIS — Z23 Encounter for immunization: Secondary | ICD-10-CM

## 2015-09-20 DIAGNOSIS — J309 Allergic rhinitis, unspecified: Secondary | ICD-10-CM

## 2015-09-20 DIAGNOSIS — H1010 Acute atopic conjunctivitis, unspecified eye: Secondary | ICD-10-CM | POA: Diagnosis not present

## 2015-09-20 DIAGNOSIS — J4530 Mild persistent asthma, uncomplicated: Secondary | ICD-10-CM | POA: Insufficient documentation

## 2015-09-20 NOTE — Progress Notes (Signed)
[facility]  Follow-up Note  Referring Provider: Glendale Chard, MD Primary Provider: Maximino Greenland, MD Date of Office Visit: 09/20/2015  Subjective:   Julie [name] is a 50 y.o. female who returns to the [facility] in re-evaluation of the following:  HPI Comments:  Julie presents to this clinic on 09/20/2015 in reevaluation of her asthma, allergic reaction to Vitas, and LPR. Overall she's done quite well since I last seen her in his clinic. At that point in time we made the assumption that she might of had a prolonged episode of sinusitis and we treated her with 20 days of Ceftin ear. She is resolved her head congestion and or decreased ability to taste in her postnasal drip is improved. She still continues to have this sensation at the back of her head may feel little bit sore. Overall she has improved. She's had very little problems with her asthma and has not had use a short acting bronchodilator and she's had very little problems with her reflux or her nose while continuing to use medical therapy directed against these issues. She did stop her methotrexate when she started her antibiotic and she did have to use some systemic steroids for flare of her rheumatoid arthritis.   Current Outpatient Prescriptions on File Prior to Visit  Medication Sig Dispense Refill  . albuterol (PROVENTIL HFA;VENTOLIN HFA) 108 (90 BASE) MCG/ACT inhaler Inhale 2 puffs into the lungs every 6 (six) hours as needed for wheezing or shortness of breath.    . beclomethasone (QVAR) 80 MCG/ACT inhaler Inhale 2 puffs into the lungs 2 (two) times daily. 1 Inhaler 1  . cefdinir (OMNICEF) 300 MG capsule TAKE ONE TABLET TWO TIMES A DAY FOR 20 DAYS 40 capsule 0  . cetirizine (ZYRTEC) 10 MG tablet Take 10 mg by mouth daily.    Marland Kitchen dexlansoprazole (DEXILANT) 60 MG capsule Take 1 capsule (60 mg total) by mouth daily. 30 capsule 2  . Diclofenac Sodium  3 % GEL Place 1 application onto the skin daily as needed (pain).    . Fluticasone Propionate (FLONASE ALLERGY RELIEF NA) Place 1 spray into the nose daily.     . folic acid (FOLVITE) 1 MG tablet Take 1 mg by mouth daily.    . hydroxychloroquine (PLAQUENIL) 200 MG tablet Take by mouth daily.    . methotrexate (50 MG/ML) 1 G injection Inject 0.8 mg into the vein once.     . montelukast (SINGULAIR) 10 MG tablet Take 1 tablet (10 mg total) by mouth at bedtime. 30 tablet 4  . ranitidine (ZANTAC) 300 MG tablet Take 1 tablet (300 mg total) by mouth at bedtime. 30 tablet 4  . Vitamin D, Ergocalciferol, (DRISDOL) 50000 UNITS CAPS capsule Take 50,000 Units by mouth once a week.  0   No current facility-administered medications on file prior to visit.    No orders of the defined types were placed in this encounter.    Past Medical History  Diagnosis Date  . Rheumatoid arthritis ([place])   . Complication of anesthesia   . PONV (postoperative nausea and vomiting)     Past Surgical History  Procedure Laterality Date  . Tonsillectomy    . Tubal ligation    . Bilateral salpingectomy Bilateral 05/25/2015    Procedure: BILATERAL SALPINGECTOMY;  Surgeon: Servando Salina, MD;  Location: [facility];  Service: Gynecology;  Laterality: Bilateral;  . Ovarian cyst removal Right 05/25/2015  Procedure: OVARIAN CYSTECTOMY;  Surgeon: Servando Salina, MD;  Location: [facility];  Service: Gynecology;  Laterality: Right;    Allergies  Allergen Reactions  . Aspirin Other (See Comments)    pt taking methotrexate  . Augmentin [Amoxicillin-Pot Clavulanate] Other (See Comments)    G I upset    Review of systems negative except as noted in HPI / PMHx or noted below:  Review of Systems  Constitutional: Negative.   HENT: Negative.   Eyes: Negative.   Respiratory: Negative.   Cardiovascular: Negative.   Gastrointestinal: Negative.   Genitourinary: Negative.   Musculoskeletal: Negative.   Skin: Negative.    Neurological: Negative.   Endo/Heme/Allergies: Negative.   Psychiatric/Behavioral: Negative.      Objective:   Filed Vitals:   09/20/15 1728  BP: 112/72  Pulse: 64  Resp: 20          Physical Exam  Constitutional: She is well-developed, well-nourished, and in no distress. No distress.  HENT:  Head: Normocephalic. Head is without right periorbital erythema and without left periorbital erythema.  Right Ear: Tympanic membrane, external ear and ear canal normal.  Left Ear: Tympanic membrane, external ear and ear canal normal.  Nose: Nose normal. No mucosal edema or rhinorrhea.  Mouth/Throat: Oropharynx is clear and moist and mucous membranes are normal. No oropharyngeal exudate.  Eyes: Conjunctivae and lids are normal. Pupils are equal, round, and reactive to light.  Neck: Trachea normal. No tracheal deviation present. No thyromegaly present.  Cardiovascular: Normal rate, regular rhythm, S1 normal, S2 normal and normal heart sounds.   No murmur heard. Pulmonary/Chest: Effort normal. No stridor. No tachypnea. No respiratory distress. She has no wheezes. She has no rales. She exhibits no tenderness.  Abdominal: Soft. There is no hepatosplenomegaly.  Musculoskeletal: She exhibits no edema.  Lymphadenopathy:       Head (right side): No tonsillar adenopathy present.       Head (left side): No tonsillar adenopathy present.    She has no cervical adenopathy.    She has no axillary adenopathy.  Neurological: She is alert. Gait normal.  Skin: No rash noted. She is not diaphoretic. No erythema. No pallor. Nails show no clubbing.  Psychiatric: Mood and affect normal.    Diagnostics:    Spirometry was performed and demonstrated an FEV1 of 1.75 at 82 % of predicted.  The patient had an Asthma Control Test with the following results: ACT Total Score: 17.    Assessment and Plan:   1. Mild persistent asthma, uncomplicated   2. Allergic rhinoconjunctivitis   3. LPRD  (laryngopharyngeal reflux disease)   4. Need for immunization against influenza      1. Nasal saline several times per day  2. Continue Qvar 80 two inhalations two times per day  3. Use flunisolide nasal spray one each nostril one time a day Monday, Wednesday, Friday  4. Continue dexilant 60 in AM + ranitidine 300 in PM  5. Continue montelukast 10 mg one time per day  6. Continue ProAir if needed.   7. Return in 12 weeks or earlier if problem.   7. Flu vaccine administered today in clinic  I think that Julie is improved and we will continue to have her use medications as specified above directed against both her atopic disease and her reflux. We will hold off on any further evaluation for lingering sinus infection at this point. She will keep in contact with me noting her status should it diminish over the course of  the next 12 weeks but otherwise I'll just regroup with her at 12 weeks in the clinic.  Allena Katz, MD [place]

## 2015-09-20 NOTE — Patient Instructions (Signed)
     1. Nasal saline several times per day  2. Continue Qvar 80 two inhalations two times per day  3. Use flunisolide nasal spray one each nostril one time a day Monday, Wednesday, Friday  4. Continue dexilant 60 in AM + ranitidine 300 in PM  5. Continue montelukast 10 mg one time per day  6. Continue ProAir if needed.   7. Return in 12 weeks or earlier if problem.   7. Flu vaccine today

## 2015-10-13 ENCOUNTER — Telehealth: Payer: Self-pay | Admitting: Allergy and Immunology

## 2015-10-13 NOTE — Telephone Encounter (Signed)
She will be going to class soon. Can you call ASAP please. She says you sent her a statement with highlighted items on it. She has questions about those items.

## 2015-10-13 NOTE — Telephone Encounter (Signed)
Sending printout from [facility].

## 2015-11-02 ENCOUNTER — Other Ambulatory Visit: Payer: Self-pay

## 2015-11-02 DIAGNOSIS — Z1231 Encounter for screening mammogram for malignant neoplasm of breast: Secondary | ICD-10-CM

## 2015-11-21 ENCOUNTER — Other Ambulatory Visit: Payer: Self-pay

## 2015-11-21 MED ORDER — DEXLANSOPRAZOLE 60 MG PO CPDR: DELAYED_RELEASE_CAPSULE | ORAL | Status: DC

## 2015-11-29 ENCOUNTER — Ambulatory Visit: Payer: 59 | Admitting: Allergy and Immunology

## 2015-12-02 ENCOUNTER — Ambulatory Visit
Admission: RE | Admit: 2015-12-02 | Discharge: 2015-12-02 | Disposition: A | Discharge status: Home / Self Care | Payer: 59 | Source: Ambulatory Visit

## 2015-12-02 DIAGNOSIS — Z1231 Encounter for screening mammogram for malignant neoplasm of breast: Secondary | ICD-10-CM

## 2015-12-06 ENCOUNTER — Encounter: Payer: Self-pay | Admitting: Allergy and Immunology

## 2015-12-06 ENCOUNTER — Ambulatory Visit (INDEPENDENT_AMBULATORY_CARE_PROVIDER_SITE_OTHER): Payer: 59 | Admitting: Allergy and Immunology

## 2015-12-06 VITALS — BP 124/72 | HR 80 | Resp 16

## 2015-12-06 DIAGNOSIS — J309 Allergic rhinitis, unspecified: Secondary | ICD-10-CM

## 2015-12-06 DIAGNOSIS — H1010 Acute atopic conjunctivitis, unspecified eye: Secondary | ICD-10-CM

## 2015-12-06 DIAGNOSIS — J4530 Mild persistent asthma, uncomplicated: Secondary | ICD-10-CM

## 2015-12-06 DIAGNOSIS — J387 Other diseases of larynx: Secondary | ICD-10-CM | POA: Diagnosis not present

## 2015-12-06 DIAGNOSIS — K219 Gastro-esophageal reflux disease without esophagitis: Secondary | ICD-10-CM

## 2015-12-06 NOTE — Progress Notes (Signed)
Follow-up Note  Referring Provider: Glendale Chard, MD Primary Provider: [name], FNP Date of Office Visit: 12/06/2015  Subjective:   Julie [name] (DOB: March 10, 1966) is a 50 y.o. female who returns to the [facility] on 12/06/2015 in re-evaluation of the following:  HPI Comments: Julie returns to this clinic in reevaluation of her asthma, allergic rhinoconjunctivitis, and LPR. She's done quite well and has not had any significant issues since I've last seen her in his clinic revolving around her respiratory tract. She is not required any systemic steroid to treat her asthma and her requirement for bronchodilator is less than 1 time per week. She does not really exercise any large degree. She's had very little issue with her nose and her reflux is been under excellent control without any issues involving her throat. She's been very good about using medical therapy as prescribed and has had no adverse effect as a result of this treatment.    Current outpatient prescriptions:  .  albuterol (PROVENTIL HFA;VENTOLIN HFA) 108 (90 BASE) MCG/ACT inhaler, Inhale 2 puffs into the lungs every 6 (six) hours as needed for wheezing or shortness of breath., Disp: , Rfl:  .  beclomethasone (QVAR) 80 MCG/ACT inhaler, Inhale 2 puffs into the lungs 2 (two) times daily., Disp: 1 Inhaler, Rfl: 1 .  cetirizine (ZYRTEC) 10 MG tablet, Take 10 mg by mouth daily., Disp: , Rfl:  .  dexlansoprazole (DEXILANT) 60 MG capsule, Take one daily in the morning to prevent reflux., Disp: 30 capsule, Rfl: 5 .  Diclofenac Sodium 3 % GEL, Place 1 application onto the skin daily as needed (pain)., Disp: , Rfl:  .  Fluticasone Propionate (FLONASE ALLERGY RELIEF NA), Place 1 spray into the nose daily. , Disp: , Rfl:  .  folic acid (FOLVITE) 1 MG tablet, Take 1 mg by mouth daily., Disp: , Rfl:  .  hydroxychloroquine (PLAQUENIL) 200 MG tablet, Take by mouth daily., Disp: , Rfl:  .  methotrexate (50 MG/ML)  1 G injection, Inject 0.8 mg into the vein once. , Disp: , Rfl:  .  montelukast (SINGULAIR) 10 MG tablet, Take 1 tablet (10 mg total) by mouth at bedtime., Disp: 30 tablet, Rfl: 4 .  ranitidine (ZANTAC) 300 MG tablet, Take 1 tablet (300 mg total) by mouth at bedtime., Disp: 30 tablet, Rfl: 4 .  Vitamin D, Ergocalciferol, (DRISDOL) 50000 UNITS CAPS capsule, Take 50,000 Units by mouth once a week., Disp: , Rfl: 0    Past Medical History  Diagnosis Date  . Rheumatoid arthritis ([place])   . Complication of anesthesia   . PONV (postoperative nausea and vomiting)     Past Surgical History  Procedure Laterality Date  . Tonsillectomy    . Tubal ligation    . Bilateral salpingectomy Bilateral 05/25/2015    Procedure: BILATERAL SALPINGECTOMY;  Surgeon: Servando Salina, MD;  Location: [facility];  Service: Gynecology;  Laterality: Bilateral;  . Ovarian cyst removal Right 05/25/2015    Procedure: OVARIAN CYSTECTOMY;  Surgeon: Servando Salina, MD;  Location: [facility];  Service: Gynecology;  Laterality: Right;    Allergies  Allergen Reactions  . Aspirin Other (See Comments)    pt taking methotrexate  . Augmentin [Amoxicillin-Pot Clavulanate] Other (See Comments)    G I upset    Review of systems negative except as noted in HPI / PMHx or noted below:  Review of Systems  Constitutional: Negative.   HENT: Negative.   Eyes: Negative.  Respiratory: Negative.   Cardiovascular: Negative.   Gastrointestinal: Negative.   Genitourinary: Negative.   Musculoskeletal: Negative.   Skin: Negative.   Neurological: Negative.   Endo/Heme/Allergies: Negative.   Psychiatric/Behavioral: Negative.      Objective:   Filed Vitals:   12/06/15 1028  BP: 124/72  Pulse: 80  Resp: 16          Physical Exam  Constitutional: She is well-developed, well-nourished, and in no distress.  HENT:  Head: Normocephalic.  Right Ear: Tympanic membrane, external ear and ear canal normal.  Left Ear: Tympanic  membrane, external ear and ear canal normal.  Nose: Nose normal. No mucosal edema or rhinorrhea.  Mouth/Throat: Uvula is midline, oropharynx is clear and moist and mucous membranes are normal. No oropharyngeal exudate.  Eyes: Conjunctivae are normal.  Neck: Trachea normal. No tracheal tenderness present. No tracheal deviation present. No thyromegaly present.  Cardiovascular: Normal rate, regular rhythm, S1 normal, S2 normal and normal heart sounds.   No murmur heard. Pulmonary/Chest: Breath sounds normal. No stridor. No respiratory distress. She has no wheezes. She has no rales.  Musculoskeletal: She exhibits no edema.  Lymphadenopathy:       Head (right side): No tonsillar adenopathy present.       Head (left side): No tonsillar adenopathy present.    She has no cervical adenopathy.    She has no axillary adenopathy.  Neurological: She is alert. Gait normal.  Skin: No rash noted. She is not diaphoretic. No erythema. Nails show no clubbing.  Psychiatric: Mood and affect normal.    Diagnostics:    Spirometry was performed and demonstrated an FEV1 of 1.90 at 89 % of predicted.  The patient had an Asthma Control Test with the following results:  .    Assessment and Plan:   1. Mild persistent asthma, uncomplicated   2. LPRD (laryngopharyngeal reflux disease)   3. Allergic rhinoconjunctivitis     1. Nasal saline several times per day if needed  2. Continue Qvar 80 two inhalations two times per day  3. Use flunisolide nasal spray one each nostril one time a day Monday, Wednesday, Friday  4. Continue dexilant 60 in AM + ranitidine 300 in PM  5. Continue montelukast 10 mg one time per day  6. Continue ProAir if needed.   7. Return in July 2017 or earlier if problem.   Julie  has done quite well on her current medical therapy and I see no need for changing this treatment at this point in time. I will see her back in this clinic in approximately 4 months or earlier if there is a  problem. She is a very good understanding about her disease states and how her medications work and when it is appropriate to return to see [place] for any issues.   Allena Katz, MD [place]

## 2015-12-06 NOTE — Patient Instructions (Signed)
     1. Nasal saline several times per day if needed  2. Continue Qvar 80 two inhalations two times per day  3. Use flunisolide nasal spray one each nostril one time a day Monday, Wednesday, Friday  4. Continue dexilant 60 in AM + ranitidine 300 in PM  5. Continue montelukast 10 mg one time per day  6. Continue ProAir if needed.   7. Return in July 2017 or earlier if problem.

## 2015-12-26 ENCOUNTER — Other Ambulatory Visit: Payer: Self-pay

## 2015-12-26 MED ORDER — BECLOMETHASONE DIPROPIONATE 80 MCG/ACT IN AERS: INHALATION_SPRAY | RESPIRATORY_TRACT | Status: DC

## 2016-02-29 ENCOUNTER — Other Ambulatory Visit: Payer: Self-pay

## 2016-02-29 MED ORDER — MONTELUKAST SODIUM 10 MG PO TABS: 10.0000 mg | ORAL_TABLET | Freq: Every day | ORAL | Status: DC

## 2016-02-29 MED ORDER — RANITIDINE HCL 300 MG PO TABS: 300.0000 mg | ORAL_TABLET | Freq: Every day | ORAL | Status: DC

## 2016-03-21 ENCOUNTER — Ambulatory Visit: Payer: 59 | Admitting: Allergy and Immunology

## 2016-04-10 ENCOUNTER — Ambulatory Visit (INDEPENDENT_AMBULATORY_CARE_PROVIDER_SITE_OTHER): Payer: 59 | Admitting: Allergy and Immunology

## 2016-04-10 ENCOUNTER — Encounter: Payer: Self-pay | Admitting: Allergy and Immunology

## 2016-04-10 VITALS — BP 110/68 | HR 72 | Resp 20

## 2016-04-10 DIAGNOSIS — H1010 Acute atopic conjunctivitis, unspecified eye: Secondary | ICD-10-CM | POA: Diagnosis not present

## 2016-04-10 DIAGNOSIS — K219 Gastro-esophageal reflux disease without esophagitis: Secondary | ICD-10-CM

## 2016-04-10 DIAGNOSIS — J387 Other diseases of larynx: Secondary | ICD-10-CM

## 2016-04-10 DIAGNOSIS — J4530 Mild persistent asthma, uncomplicated: Secondary | ICD-10-CM | POA: Diagnosis not present

## 2016-04-10 DIAGNOSIS — J019 Acute sinusitis, unspecified: Secondary | ICD-10-CM | POA: Diagnosis not present

## 2016-04-10 DIAGNOSIS — J309 Allergic rhinitis, unspecified: Secondary | ICD-10-CM

## 2016-04-10 MED ORDER — METHYLPREDNISOLONE ACETATE 80 MG/ML IJ SUSP
80.0000 mg | Freq: Once | INTRAMUSCULAR | Status: AC
  Administered 2016-04-10: 80 mg via INTRAMUSCULAR

## 2016-04-10 MED ORDER — CEFUROXIME AXETIL 250 MG PO TABS: 250.0000 mg | ORAL_TABLET | Freq: Two times a day (BID) | ORAL | 0 refills | Status: AC

## 2016-04-10 NOTE — Patient Instructions (Addendum)
    1. Nasal saline several times per day if needed  2. Continue Qvar 80 two inhalations two times per day  3. Use flunisolide nasal spray one each nostril one time a day Monday, Wednesday, Friday  4. Continue dexilant 60 in AM + ranitidine 300 in PM  5. Continue montelukast 10 mg one time per day  6. Continue ProAir if needed.   7. Depomedrol 80 IM delivered in clinic today  8. Ceftin 250 one tablet two times a day for 14 days.  9. Return in november 2017 or earlier if problem.   10. Obtain fall flu vaccine

## 2016-04-10 NOTE — Progress Notes (Signed)
Follow-up Note  Referring Provider: [name], FNP Primary Provider: [name], FNP Date of Office Visit: 04/10/2016  Subjective:   Julie [name] (DOB: 03-Jun-1966) is a 50 y.o. female who returns to the [facility] on 04/10/2016 in re-evaluation of the following:  HPI: Julie presents this clinic in evaluation of respiratory tract symptoms that have developed over the course of the past several weeks. I last saw her in his clinic in March 2017 and she was doing relatively well regarding her asthma and allergic rhinoconjunctivitis and LPR while consistently using medical therapy up until a few weeks ago.  Several weeks ago she developed nasal congestion and sneezing and lots of pressure behind her eyes. This all started with a sore throat for a few days but that has since resolved. She's had lots of postnasal drip and she feels as though her breathing is off somewhat and she has had use her bronchodilator over the course the past 2 days. Some of the material she's coughing up from her throat is yellow. In addition, her rheumatoid arthritis has been relatively active around the same time course. She's had a flare of her hand disease during this timeframe.    Medication List      albuterol 108 (90 Base) MCG/ACT inhaler Commonly known as:  PROVENTIL HFA;VENTOLIN HFA Inhale 2 puffs into the lungs every 6 (six) hours as needed for wheezing or shortness of breath.   beclomethasone 80 MCG/ACT inhaler Commonly known as:  QVAR Use 2 puffs twice daily to prevent cough or wheeze.  Rinse, gargle, and spit after use.   cetirizine 10 MG tablet Commonly known as:  ZYRTEC Take 10 mg by mouth daily.   dexlansoprazole 60 MG capsule Commonly known as:  DEXILANT Take one daily in the morning to prevent reflux.   Diclofenac Sodium 3 % Gel Place 1 application onto the skin daily as needed (pain).   escitalopram 10 MG tablet Commonly known as:  LEXAPRO Take 1  tablet by mouth daily.   FLONASE ALLERGY RELIEF NA Place 1 spray into the nose daily.   folic acid 1 MG tablet Commonly known as:  FOLVITE Take 1 mg by mouth daily.   hydroxychloroquine 200 MG tablet Commonly known as:  PLAQUENIL Take by mouth daily.   levocetirizine 5 MG tablet Commonly known as:  XYZAL Take 5 mg by mouth at bedtime.   lidocaine 5 % ointment Commonly known as:  XYLOCAINE apply 1-2 grams to affected area 3-4 times daily as needed   methotrexate 1 g injection Commonly known as:  50 mg/ml Inject 0.8 mg into the vein once.   montelukast 10 MG tablet Commonly known as:  SINGULAIR Take 1 tablet (10 mg total) by mouth at bedtime.   ranitidine 300 MG tablet Commonly known as:  ZANTAC Take 1 tablet (300 mg total) by mouth at bedtime.   Vitamin D (Ergocalciferol) 50000 units Caps capsule Commonly known as:  DRISDOL Take 50,000 Units by mouth once a week.       Past Medical History:  Diagnosis Date  . Complication of anesthesia   . PONV (postoperative nausea and vomiting)   . Rheumatoid arthritis [facility])     Past Surgical History:  Procedure Laterality Date  . BILATERAL SALPINGECTOMY Bilateral 05/25/2015   Procedure: BILATERAL SALPINGECTOMY;  Surgeon: Servando Salina, MD;  Location: [facility];  Service: Gynecology;  Laterality: Bilateral;  . OVARIAN CYST REMOVAL Right 05/25/2015   Procedure: OVARIAN CYSTECTOMY;  Surgeon: Servando Salina, MD;  Location: [facility];  Service: Gynecology;  Laterality: Right;  . TONSILLECTOMY    . TUBAL LIGATION      Allergies  Allergen Reactions  . Aspirin Other (See Comments)    pt taking methotrexate  . Augmentin [Amoxicillin-Pot Clavulanate] Other (See Comments)    G I upset    Review of systems negative except as noted in HPI / PMHx or noted below:  Review of Systems  Constitutional: Negative.   HENT: Negative.   Eyes: Negative.   Respiratory: Negative.   Cardiovascular: Negative.   Gastrointestinal: Negative.    Genitourinary: Negative.   Musculoskeletal: Negative.   Skin: Negative.   Neurological: Negative.   Endo/Heme/Allergies: Negative.   Psychiatric/Behavioral: Negative.      Objective:   Vitals:   04/10/16 1552  BP: 110/68  Pulse: 72  Resp: 20          Physical Exam  Constitutional: She is well-developed, well-nourished, and in no distress.  Nasal voice  HENT:  Head: Normocephalic.  Right Ear: Tympanic membrane, external ear and ear canal normal.  Left Ear: Tympanic membrane, external ear and ear canal normal.  Nose: Nose normal. No mucosal edema or rhinorrhea.  Mouth/Throat: Uvula is midline, oropharynx is clear and moist and mucous membranes are normal. No oropharyngeal exudate.  Eyes: Conjunctivae are normal.  Neck: Trachea normal. No tracheal tenderness present. No tracheal deviation present. No thyromegaly present.  Cardiovascular: Normal rate, regular rhythm, S1 normal, S2 normal and normal heart sounds.   No murmur heard. Pulmonary/Chest: Breath sounds normal. No stridor. No respiratory distress. She has no wheezes. She has no rales.  Musculoskeletal: She exhibits no edema.  Lymphadenopathy:       Head (right side): No tonsillar adenopathy present.       Head (left side): No tonsillar adenopathy present.    She has no cervical adenopathy.  Neurological: She is alert. Gait normal.  Skin: No rash noted. She is not diaphoretic. No erythema. Nails show no clubbing.  Psychiatric: Mood and affect normal.    Diagnostics:    Spirometry was performed and demonstrated an FEV1 of 1.49 at 70 % of predicted.  The patient had an Asthma Control Test with the following results:  .    Assessment and Plan:   1. Mild persistent asthma, uncomplicated   2. Allergic rhinoconjunctivitis   3. LPRD (laryngopharyngeal reflux disease)   4. Acute sinusitis, recurrence not specified, unspecified location      1. Nasal saline several times per day if needed  2. Continue Qvar 80  two inhalations two times per day  3. Use flunisolide nasal spray one each nostril one time a day Monday, Wednesday, Friday  4. Continue dexilant 60 in AM + ranitidine 300 in PM  5. Continue montelukast 10 mg one time per day  6. Continue ProAir if needed.   7. Depomedrol 80 IM delivered in clinic today  8. Ceftin 250 one tablet two times a day for 14 days.  9. Return in november 2017 or earlier if problem.   10. Obtain fall flu vaccine   I'm going to treat Julie for an episode of significant inflammation and possible infection of her respiratory tract utilizing the therapy mentioned above. Hopefully this will result in complete elimination of all of her respiratory tract symptoms and she can go back to her very well functioning stay that appear to exist for a prolonged period In time up until this event. She will keep  in contact with me noting her response and will make a decision about further evaluation and treatment pending his response. If she does well I'll see her back in this clinic in November or earlier if there is a problem  Allena Katz, MD [place]

## 2016-04-25 ENCOUNTER — Ambulatory Visit: Payer: 59 | Admitting: Allergy and Immunology

## 2016-04-29 ENCOUNTER — Other Ambulatory Visit: Payer: Self-pay | Admitting: Allergy and Immunology

## 2016-05-08 ENCOUNTER — Telehealth: Payer: Self-pay | Admitting: Allergy and Immunology

## 2016-05-08 NOTE — Telephone Encounter (Signed)
Dr Kozlow please advise 

## 2016-05-08 NOTE — Telephone Encounter (Signed)
Patient called and said her Sinus problem hasn't cleared up since she was here on 04/10/16. She wants advice on what to do.

## 2016-05-09 MED ORDER — AZITHROMYCIN 500 MG PO TABS: 500.0000 mg | ORAL_TABLET | Freq: Every day | ORAL | 0 refills | Status: AC

## 2016-05-09 NOTE — Telephone Encounter (Signed)
Please inform patient that she could take azithromycin 500 mg once a day for 3 days which will remain in her body for a total of 10 days and she should perform pretty aggressive nasal saline spray and wash a few times a day.

## 2016-05-09 NOTE — Telephone Encounter (Signed)
Informed patient of script and to use saline a few times a day. Patient agree with plan and will call us back if she does not improve.

## 2016-05-09 NOTE — Telephone Encounter (Signed)
Sent script into pharmacy for patient. Call patient but no answer and no machine.

## 2016-05-29 ENCOUNTER — Encounter: Payer: Self-pay | Admitting: Allergy and Immunology

## 2016-05-29 ENCOUNTER — Encounter (INDEPENDENT_AMBULATORY_CARE_PROVIDER_SITE_OTHER): Payer: Self-pay

## 2016-05-29 ENCOUNTER — Ambulatory Visit (INDEPENDENT_AMBULATORY_CARE_PROVIDER_SITE_OTHER): Payer: 59 | Admitting: Allergy and Immunology

## 2016-05-29 ENCOUNTER — Telehealth: Payer: Self-pay | Admitting: Allergy and Immunology

## 2016-05-29 VITALS — BP 130/72 | HR 72 | Resp 16

## 2016-05-29 DIAGNOSIS — H1010 Acute atopic conjunctivitis, unspecified eye: Secondary | ICD-10-CM

## 2016-05-29 DIAGNOSIS — J4530 Mild persistent asthma, uncomplicated: Secondary | ICD-10-CM

## 2016-05-29 DIAGNOSIS — K219 Gastro-esophageal reflux disease without esophagitis: Secondary | ICD-10-CM

## 2016-05-29 DIAGNOSIS — D899 Disorder involving the immune mechanism, unspecified: Secondary | ICD-10-CM | POA: Diagnosis not present

## 2016-05-29 DIAGNOSIS — J387 Other diseases of larynx: Secondary | ICD-10-CM | POA: Diagnosis not present

## 2016-05-29 DIAGNOSIS — J309 Allergic rhinitis, unspecified: Secondary | ICD-10-CM

## 2016-05-29 DIAGNOSIS — J329 Chronic sinusitis, unspecified: Secondary | ICD-10-CM | POA: Diagnosis not present

## 2016-05-29 DIAGNOSIS — D849 Immunodeficiency, unspecified: Secondary | ICD-10-CM

## 2016-05-29 NOTE — Progress Notes (Signed)
Follow-up Note  Referring Provider: [name], FNP Primary Provider: [name], FNP Date of Office Visit: 05/29/2016  Subjective:   Julie [name] (DOB: 05/13/1966) is a 50 y.o. female who returns to the [facility] on 05/29/2016 in re-evaluation of the following:  HPI: Julie presents to this clinic in evaluation of persistent respiratory tract symptoms. I last saw her in his clinic on 04/10/2016 at which time we treated her with antibiotics for what appeared to be an episode of sinusitis. She has never really fully recovered and in fact over the course of the past 7 days has once again developed very significant head congestion and thick mucus production and postnasal drip and complete anosmia. She was initially treated with Ceftin followed by azithromycin back in July and August. She's only had 1 injection of methotrexate for her rheumatoid arthritis since that point in time. She's had no issues with her asthma and has no need to use any short acting bronchodilator. She has no issues with reflux at this point in time.    Medication List      albuterol 108 (90 Base) MCG/ACT inhaler Commonly known as:  PROVENTIL HFA;VENTOLIN HFA Inhale 2 puffs into the lungs every 6 (six) hours as needed for wheezing or shortness of breath.   beclomethasone 80 MCG/ACT inhaler Commonly known as:  QVAR Use 2 puffs twice daily to prevent cough or wheeze.  Rinse, gargle, and spit after use.   cetirizine 10 MG tablet Commonly known as:  ZYRTEC Take 10 mg by mouth daily.   DEXILANT 60 MG capsule Generic drug:  dexlansoprazole TAKE ONE DAILY IN THE MORNING TO PREVENT REFLUX.   Diclofenac Sodium 3 % Gel Place 1 application onto the skin daily as needed (pain).   escitalopram 10 MG tablet Commonly known as:  LEXAPRO Take 1 tablet by mouth daily.   FLONASE ALLERGY RELIEF NA Place 1 spray into the nose daily.   folic acid 1 MG tablet Commonly known as:   FOLVITE Take 1 mg by mouth 2 (two) times daily.   hydroxychloroquine 200 MG tablet Commonly known as:  PLAQUENIL Take by mouth daily.   levocetirizine 5 MG tablet Commonly known as:  XYZAL Take 5 mg by mouth at bedtime.   lidocaine 5 % ointment Commonly known as:  XYLOCAINE apply 1-2 grams to affected area 3-4 times daily as needed   methotrexate 1 g injection Commonly known as:  50 mg/ml Inject 0.8 mg into the vein once.   montelukast 10 MG tablet Commonly known as:  SINGULAIR Take 1 tablet (10 mg total) by mouth at bedtime.   ranitidine 300 MG tablet Commonly known as:  ZANTAC Take 1 tablet (300 mg total) by mouth at bedtime.   Vitamin D (Ergocalciferol) 50000 units Caps capsule Commonly known as:  DRISDOL Take 50,000 Units by mouth once a week.       Past Medical History:  Diagnosis Date  . Complication of anesthesia   . PONV (postoperative nausea and vomiting)   . Rheumatoid arthritis [facility])     Past Surgical History:  Procedure Laterality Date  . BILATERAL SALPINGECTOMY Bilateral 05/25/2015   Procedure: BILATERAL SALPINGECTOMY;  Surgeon: Servando Salina, MD;  Location: [facility];  Service: Gynecology;  Laterality: Bilateral;  . OVARIAN CYST REMOVAL Right 05/25/2015   Procedure: OVARIAN CYSTECTOMY;  Surgeon: Servando Salina, MD;  Location: [facility];  Service: Gynecology;  Laterality: Right;  . TONSILLECTOMY    . TUBAL  LIGATION      Allergies  Allergen Reactions  . Aspirin Other (See Comments)    pt taking methotrexate  . Augmentin [Amoxicillin-Pot Clavulanate] Other (See Comments)    G I upset    Review of systems negative except as noted in HPI / PMHx or noted below:  Review of Systems  Constitutional: Negative.   HENT: Negative.   Eyes: Negative.   Respiratory: Negative.   Cardiovascular: Negative.   Gastrointestinal: Negative.   Genitourinary: Negative.   Musculoskeletal: Negative.   Skin: Negative.   Neurological: Negative.    Endo/Heme/Allergies: Negative.   Psychiatric/Behavioral: Negative.      Objective:   Vitals:   05/29/16 1806  BP: 130/72  Pulse: 72  Resp: 16          Physical Exam  Constitutional: She is well-developed, well-nourished, and in no distress.  Nasal voice  HENT:  Head: Normocephalic.  Right Ear: Tympanic membrane, external ear and ear canal normal.  Left Ear: Tympanic membrane, external ear and ear canal normal.  Nose: Nose normal. No mucosal edema or rhinorrhea.  Mouth/Throat: Uvula is midline, oropharynx is clear and moist and mucous membranes are normal. No oropharyngeal exudate.  Eyes: Conjunctivae are normal.  Neck: Trachea normal. No tracheal tenderness present. No tracheal deviation present. No thyromegaly present.  Cardiovascular: Normal rate, regular rhythm, S1 normal, S2 normal and normal heart sounds.   No murmur heard. Pulmonary/Chest: Breath sounds normal. No stridor. No respiratory distress. She has no wheezes. She has no rales.  Musculoskeletal: She exhibits no edema.  Lymphadenopathy:       Head (right side): No tonsillar adenopathy present.       Head (left side): No tonsillar adenopathy present.    She has no cervical adenopathy.  Neurological: She is alert. Gait normal.  Skin: No rash noted. She is not diaphoretic. No erythema. Nails show no clubbing.  Psychiatric: Mood and affect normal.    Diagnostics:    Spirometry was performed and demonstrated an FEV1 of 1.70 at 80 % of predicted.  The patient had an Asthma Control Test with the following results:  .    Assessment and Plan:   1. Mild persistent asthma, uncomplicated   2. Allergic rhinoconjunctivitis   3. LPRD (laryngopharyngeal reflux disease)   4. Chronic sinusitis, unspecified location   5. Immunosuppression ([place])      1. Nasal saline several times per day if needed  2. Continue Qvar 80 two inhalations two times per day  3. Use flunisolide nasal spray one each nostril one time a day  Monday, Wednesday, Friday  4. Continue dexilant 60 in AM + ranitidine 300 in PM  5. Continue montelukast 10 mg one time per day  6. Continue ProAir if needed.   7. Clindamycin 150 one tablet 3 times a day for the next 20 days  8. Obtain limited sinus CT scan 20 days from now  9. Further evaluation and treatment?  10. Obtain fall flu vaccine when better  11. Return to clinic in January 2018 or earlier if problem   I think the best way to approach Lekeya's persistent repiratory issue is to treat her with broad-spectrum antibiotics including therapy against resistant organisms with clindamycin and then take a look at her airway with a sinus CT after 20 days of this therapy to see if she still has any evidence of the lingering sinus infection. She'll continue to use anti-inflammatory medications for her respiratory tract as well as therapy directed against  reflux-induced respiratory disease as noted above. At this point she is going to hold off on methotrexate until we can get things completely cleared up. I will be contacting her with the results of her CT scan once it's available for review. She will contact me should she have any problems while utilizing clindamycin.  Allena Katz, MD [place]

## 2016-05-29 NOTE — Telephone Encounter (Signed)
Patient came in as walkin seen by Dr Neldon Mc

## 2016-05-29 NOTE — Telephone Encounter (Signed)
Called pt she has no voicemail set up

## 2016-05-29 NOTE — Telephone Encounter (Signed)
Patient called and feels sick. Head is congested, ears are stopped up and has a cough. Would like to speak to a nurse for advice.

## 2016-05-29 NOTE — Patient Instructions (Signed)
    1. Nasal saline several times per day if needed  2. Continue Qvar 80 two inhalations two times per day  3. Use flunisolide nasal spray one each nostril one time a day Monday, Wednesday, Friday  4. Continue dexilant 60 in AM + ranitidine 300 in PM  5. Continue montelukast 10 mg one time per day  6. Continue ProAir if needed.   7. Clindamycin 150 one tablet 3 times a day for the next 20 days  8. Obtain limited sinus CT scan 20 days from now  9. Further evaluation and treatment?  10. Obtain fall flu vaccine when better  11. Return to clinic in January 2018 or earlier if problem

## 2016-05-30 MED ORDER — FLUNISOLIDE 25 MCG/ACT (0.025%) NA SOLN: NASAL | 5 refills | Status: DC

## 2016-05-30 MED ORDER — CLINDAMYCIN HCL 150 MG PO CAPS: 150.0000 mg | ORAL_CAPSULE | Freq: Three times a day (TID) | ORAL | 0 refills | Status: DC

## 2016-06-13 ENCOUNTER — Other Ambulatory Visit: Payer: Self-pay | Admitting: Allergy and Immunology

## 2016-06-23 ENCOUNTER — Other Ambulatory Visit: Payer: Self-pay | Admitting: Radiology

## 2016-06-23 DIAGNOSIS — Z79899 Other long term (current) drug therapy: Secondary | ICD-10-CM

## 2016-07-14 ENCOUNTER — Other Ambulatory Visit: Payer: Self-pay | Admitting: Allergy and Immunology

## 2016-08-03 ENCOUNTER — Telehealth: Payer: Self-pay | Admitting: Rheumatology

## 2016-08-03 MED ORDER — PREDNISONE 5 MG PO TABS: ORAL_TABLET | ORAL | 0 refills | Status: DC

## 2016-08-03 NOTE — Telephone Encounter (Signed)
Patient would like to speak to someone about arthritis flare and pain she is having. Patient is requesting a phone call back today.

## 2016-08-03 NOTE — Telephone Encounter (Signed)
Patient states she has only has her MTX injection twice within the last 2 1/2 months. Patient has been sick and on antibiotics. Patient states she is having a flare up.

## 2016-08-03 NOTE — Telephone Encounter (Signed)
Patient is having a flare because she's been off of her methotrexate over the last few weeks. 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 As a result she's been off of her methotrexate and has only had an opportunity take it 2 times in about last 2 months.Her shoulder joints elbow joints and wrist joints are hurting at this time. She states "I think it's my rheumatoid arthritis flare".I will call in some medications as follows. Prednisone 5 mg 4 pills for 4 days to 4 days to 40s 140s. I dispense 42 pills. I have instructed the patient how to take the medication and she understands and is agreeable.

## 2016-08-16 ENCOUNTER — Other Ambulatory Visit: Payer: Self-pay | Admitting: Rheumatology

## 2016-08-24 ENCOUNTER — Ambulatory Visit (INDEPENDENT_AMBULATORY_CARE_PROVIDER_SITE_OTHER): Payer: 59 | Admitting: Allergy

## 2016-08-24 ENCOUNTER — Encounter (INDEPENDENT_AMBULATORY_CARE_PROVIDER_SITE_OTHER): Payer: Self-pay

## 2016-08-24 ENCOUNTER — Telehealth: Payer: Self-pay | Admitting: Rheumatology

## 2016-08-24 ENCOUNTER — Encounter: Payer: Self-pay | Admitting: Allergy

## 2016-08-24 VITALS — BP 130/80 | HR 86 | Temp 98.0°F | Resp 20 | Ht 61.25 in | Wt 179.0 lb

## 2016-08-24 DIAGNOSIS — K219 Gastro-esophageal reflux disease without esophagitis: Secondary | ICD-10-CM | POA: Diagnosis not present

## 2016-08-24 DIAGNOSIS — J0101 Acute recurrent maxillary sinusitis: Secondary | ICD-10-CM | POA: Diagnosis not present

## 2016-08-24 DIAGNOSIS — J4530 Mild persistent asthma, uncomplicated: Secondary | ICD-10-CM | POA: Diagnosis not present

## 2016-08-24 DIAGNOSIS — H1010 Acute atopic conjunctivitis, unspecified eye: Secondary | ICD-10-CM

## 2016-08-24 DIAGNOSIS — J309 Allergic rhinitis, unspecified: Secondary | ICD-10-CM

## 2016-08-24 MED ORDER — MOXIFLOXACIN HCL 400 MG PO TABS: 400.0000 mg | ORAL_TABLET | Freq: Every day | ORAL | 0 refills | Status: DC

## 2016-08-24 MED ORDER — METHYLPREDNISOLONE ACETATE 80 MG/ML IJ SUSP
80.0000 mg | Freq: Once | INTRAMUSCULAR | Status: AC
  Administered 2016-08-27: 80 mg via INTRAMUSCULAR

## 2016-08-24 NOTE — Progress Notes (Signed)
Follow-up Note  RE: Julie [name] MRN: [id number] DOB: May 08, 1966 Date of Office Visit: 08/24/2016   History of present illness: Julie [name] is a 50 y.o. female presenting today for sick visit.  She was last seen in Dr. Neldon Mc on 05/29/16 for asthma, allergic rhinoconjunctivitis, reflux, chronic sinusitis.   At that visit she had treated for sinus infection with clindamycin 20day course and wanted to obtain an sinus CT which has not been done yet.  She did get better from that infection.  However with the changes in the weather she reports symptoms of sinus pressure and pain in maxillary sinus as well as behind the eyes and occipitally.  She also is having a thick nasal drainage that she reports is getting stuck in the back of her throat leading to cough of which she coughs through the night to try to clear throat.  These symptoms have been worsening over the past 2 weeks.  She denies any fever.  She has been use cough drops around the clock as well as tylenol cold and sinus medication. She had to stop her flunisolide spray as she was developing nose bleeds and states her nose is "raw".  She does continue to use her saline rinses.  She continues on her routine medication of Qvar 80 2 puffs twice a day, singulair, reflux medications and proair as needed (which she has not used).     She has RA an her last MTX injection was 3 weeks ago.       Review of systems: Review of Systems  Constitutional: Positive for malaise/fatigue. Negative for chills and fever.  HENT: Positive for congestion, nosebleeds, sinus pain and sore throat. Negative for ear discharge and ear pain.   Eyes: Negative for discharge and redness.  Respiratory: Positive for cough. Negative for shortness of breath and wheezing.   Cardiovascular: Negative for chest pain.  Gastrointestinal: Negative for heartburn, nausea and vomiting.  Skin: Negative for itching and rash.  Neurological: Positive for headaches.    All  other systems negative unless noted above in HPI  Past medical/social/surgical/family history have been reviewed and are unchanged unless specifically indicated below.  No changes  Medication List:   Medication List       Accurate as of 08/24/16  4:56 PM. Always use your most recent med list.          albuterol 108 (90 Base) MCG/ACT inhaler Commonly known as:  PROVENTIL HFA;VENTOLIN HFA Inhale 2 puffs into the lungs every 6 (six) hours as needed for wheezing or shortness of breath.   B-D TB SYRINGE 1CC/27GX1/2" 27G X 1/2" 1 ML Misc Generic drug:  TUBERCULIN SYR 1CC/27GX1/2" Inject 1 Syringe as directed once a week.   beclomethasone 80 MCG/ACT inhaler Commonly known as:  QVAR Use 2 puffs twice daily to prevent cough or wheeze.  Rinse, gargle, and spit after use.   cetirizine 10 MG tablet Commonly known as:  ZYRTEC Take 10 mg by mouth daily.   DEXILANT 60 MG capsule Generic drug:  dexlansoprazole TAKE ONE DAILY IN THE MORNING TO PREVENT REFLUX.   Diclofenac Sodium 3 % Gel Place 1 application onto the skin daily as needed (pain).   escitalopram 10 MG tablet Commonly known as:  LEXAPRO Take 1 tablet by mouth daily.   FLONASE ALLERGY RELIEF NA Place 1 spray into the nose daily.   flunisolide 25 MCG/ACT (0.025%) Soln Commonly known as:  NASALIDE 1 spray each nostril Monday, Wednesday and fridays.  folic acid 1 MG tablet Commonly known as:  FOLVITE Take 1 mg by mouth 2 (two) times daily.   hydroxychloroquine 200 MG tablet Commonly known as:  PLAQUENIL Take by mouth daily.   levocetirizine 5 MG tablet Commonly known as:  XYZAL Take 5 mg by mouth at bedtime.   lidocaine 5 % ointment Commonly known as:  XYLOCAINE apply 1-2 grams to affected area 3-4 times daily as needed   methotrexate 1 g injection Commonly known as:  50 mg/ml Inject 0.8 mg into the vein once.   montelukast 10 MG tablet Commonly known as:  SINGULAIR TAKE 1 TABLET BY MOUTH AT BEDTIME     moxifloxacin 400 MG tablet Commonly known as:  AVELOX Take 1 tablet (400 mg total) by mouth daily.   ranitidine 300 MG tablet Commonly known as:  ZANTAC TAKE 1 TABLET BY MOUTH AT BEDTIME   Vitamin D (Ergocalciferol) 50000 units Caps capsule Commonly known as:  DRISDOL Take 50,000 Units by mouth once a week.       Known medication allergies: Allergies  Allergen Reactions  . Aspirin Other (See Comments)    pt taking methotrexate  . Augmentin [Amoxicillin-Pot Clavulanate] Other (See Comments)    G I upset     Physical examination: Blood pressure 130/80, pulse 86, temperature 98 F (36.7 C), temperature source Oral, resp. rate 20, height 5' 1.25" (1.556 m), weight 179 lb (81.2 kg), SpO2 98 %.  General: Alert, interactive, in no acute distress. HEENT: TMs pearly gray, turbinates markedly edematous and very erythematous with thick discharge, post-pharynx moderately erythematous. Neck: Supple without lymphadenopathy. Lungs: Clear to auscultation without wheezing, rhonchi or rales. {no increased work of breathing. CV: Normal S1, S2 without murmurs. Abdomen: Nondistended, nontender. Skin: Warm and dry, without lesions or rashes. Extremities:  No clubbing, cyanosis or edema. Neuro:   Grossly intact.  Diagnositics/Labs:  Spirometry: FEV1: 1.61L  77%, FVC: 1.92L  76%, lung functions are reduced from previous study  Assessment and plan:   Chronic sinusitis with acute infection    - she is immunosuppressed due to MTX and plaquenil needs to control her RA which places her at increase risk for develop of recurrent sinopulmonary infections.     - worsening symptoms appear again to be bacterial and will treat with Avelox 400mg  x 10 days    - depo-medrol 80mg  injection given in office    - she will be contact with her rheumatologist regarding antibiotic need for current infection    - continue Nasal saline several times per day if needed    - use Mucinex 1200mg  twice a day drink  with plenty of water to help thin mucus    - advised she hold her flunisolide for nasal due to epistaxis and her nasal mucosa is extremely inflamed.  She may resume M,W,F use once epistaxis has resolved    - will obtain limited sinus CT -- order placed and schedule   Mild persistent asthma  - Continue Qvar 80 two inhalations two times per day  - Continue ProAir if needed.   - Continue montelukast 10 mg one time per day  Allergic rhinoconjunctivitis  - continue zyrtec 10mg  or xyzal 5mg  daily  - singulair and nasal spray as above  Reflux - Continue dexilant 60 in AM + ranitidine 300 in PM  Return to clinic in January 2018 or earlier if problem    I appreciate the opportunity to take part in [place] care. Please do not hesitate to contact me  with questions.  Sincerely,   Prudy Feeler, MD Allergy/Immunology [facility]

## 2016-08-24 NOTE — Patient Instructions (Signed)
    1. Nasal saline several times per day if needed  2. Continue Qvar 80 two inhalations two times per day  3. Hold your nasal spray while nose is bleeding and "raw".   Use flunisolide nasal spray one each nostril one time a day Monday, Wednesday, Friday  4.  Use Mucinex 1200mg  twice a day drink with plenty of water  5. Continue dexilant 60 in AM + ranitidine 300 in PM  6 Continue montelukast 10 mg one time per day  7. Continue ProAir if needed.   8. Avelox 400mg  daily x 10 days  9.  Depomedrol injection 80mg  in office today  10. Obtain limited sinus CT scan  11. Return to clinic in January 2018 or earlier if problem

## 2016-08-24 NOTE — Telephone Encounter (Signed)
Patient called and says her allergy doctor is putting her on Avelox 400 mg for 10 days and patient says her RA acts up when on antibiotics. Please call patient.

## 2016-08-27 DIAGNOSIS — J0101 Acute recurrent maxillary sinusitis: Secondary | ICD-10-CM | POA: Diagnosis not present

## 2016-08-27 MED ORDER — PREDNISONE 5 MG PO TABS: ORAL_TABLET | ORAL | 0 refills | Status: DC

## 2016-08-27 NOTE — Telephone Encounter (Signed)
Patient states she was started on Avelox. Patient is has stopped her MTX while on the Avelox. Patient is to be on the Avelox for 10 days. Patient states she is already a little sore in her hands and ankles as well as her shoulders. Patient states she is usually given prednisone when this occurs.

## 2016-08-27 NOTE — Telephone Encounter (Signed)
Patient can start a prednisone taper on Wednesday if her hand pain and ankle pain get worse.If she feels that it is bad enough to start tomorrow, she can start prednisone tomorrow morning.  OK to Prescribe  Prednisone 5mg :  4po qAM x 4 days,  3po qAM x 4 days, 2po qAM x 4 days,  1po qAM x 4 days, 1/2po qAM x 4 days, then stop.; disp 42 pills w/ no refills.  Keep appointment with our office as scheduled on 08/31/2016. this Friday.

## 2016-08-27 NOTE — Addendum Note (Signed)
Addended by: Carole Binning on: 08/27/2016 02:18 PM   Modules accepted: Orders

## 2016-08-27 NOTE — Telephone Encounter (Signed)
Patient advised of recommendation and prescription sent to the pharmacy. Patient verbalized understanding.

## 2016-08-30 ENCOUNTER — Telehealth: Payer: Self-pay | Admitting: Allergy

## 2016-08-30 NOTE — Telephone Encounter (Signed)
She was called by [facility] saying that the insurance hadn't approved her procedure yet and her appointment was canceled. She wants to know what she needs to do now? Will we call to set up a new appointment for her? Can you please call her this afternoon to advise. Her number is [phone].

## 2016-08-31 ENCOUNTER — Ambulatory Visit (HOSPITAL_COMMUNITY): Payer: 59

## 2016-09-03 NOTE — Telephone Encounter (Signed)
Peer to Peer request information received. Put information on Provider's desk for completion.

## 2016-09-04 ENCOUNTER — Telehealth: Payer: Self-pay | Admitting: Allergy

## 2016-09-04 NOTE — Telephone Encounter (Signed)
Pt called and said that she had appointment to have a ct/scan and we cancelled it and wants to know when she is suppose to go . [phone].

## 2016-09-04 NOTE — Telephone Encounter (Signed)
Please advise with update on approval.

## 2016-09-04 NOTE — Telephone Encounter (Signed)
Talked to Julie [name] and fax approval info to her to schedule CT

## 2016-09-04 NOTE — Telephone Encounter (Signed)
Provided approval code which she faxed over to [facility] office for scheduling.

## 2016-09-04 NOTE — Telephone Encounter (Signed)
Peer-to-peer completed and approved and access code provided.  Gave information to Julie [name] to reschedule CT scan.

## 2016-09-04 NOTE — Telephone Encounter (Signed)
Patient insurance requested a Peer to peer review. Dr. Bary Leriche to call insurance.

## 2016-09-05 NOTE — Telephone Encounter (Addendum)
Scheduled Limited Sinus CT Scan w/ Trula Ore [phone]: [facility]                                                                                                     09/12/16 7:00am                                                                                                        Pt to arrive @ 6:45 am                                                                                                     Approval Code: [id number]                                                                                                     Exp:10/19/2016   Clld pt  - advsd of appt. Pt requested info to be left on her voicemail/[phone] as well. Unable to leave voicemail - per recording voicemail is full.  Faxing copy of approval to [facility] @ [phone].

## 2016-09-06 NOTE — Telephone Encounter (Signed)
Patient informed of approval by Sharyn Lull and new appointment scheduled.

## 2016-09-12 ENCOUNTER — Ambulatory Visit (HOSPITAL_COMMUNITY)
Admission: RE | Admit: 2016-09-12 | Discharge: 2016-09-12 | Disposition: A | Discharge status: Home / Self Care | Payer: 59 | Source: Ambulatory Visit | Attending: Allergy | Admitting: Allergy

## 2016-09-12 ENCOUNTER — Encounter (HOSPITAL_COMMUNITY): Payer: Self-pay

## 2016-09-12 DIAGNOSIS — J0101 Acute recurrent maxillary sinusitis: Secondary | ICD-10-CM | POA: Insufficient documentation

## 2016-09-25 ENCOUNTER — Telehealth: Payer: Self-pay | Admitting: Allergy

## 2016-09-25 NOTE — Telephone Encounter (Signed)
Dr. Nelva Bush can you please review.

## 2016-09-25 NOTE — Telephone Encounter (Signed)
She had a CT scan done and would like the results.

## 2016-09-27 ENCOUNTER — Telehealth: Payer: Self-pay | Admitting: Allergy

## 2016-09-27 NOTE — Telephone Encounter (Signed)
Patient states she is having pain now. Also what is the next step? Patient has more questions could you please call patient.

## 2016-09-27 NOTE — Telephone Encounter (Signed)
Patient is checking on results of a CT scan. She said she has called our office 3-4 times and cannot get anyone to call her back with the results. She is worried something is wrong or wonders if she needs to have the test redone.

## 2016-10-02 ENCOUNTER — Encounter: Payer: Self-pay | Admitting: Allergy and Immunology

## 2016-10-02 ENCOUNTER — Ambulatory Visit (INDEPENDENT_AMBULATORY_CARE_PROVIDER_SITE_OTHER): Payer: 59 | Admitting: Allergy and Immunology

## 2016-10-02 ENCOUNTER — Telehealth: Payer: Self-pay | Admitting: Allergy

## 2016-10-02 VITALS — BP 122/82 | HR 72 | Temp 98.1°F | Resp 16

## 2016-10-02 DIAGNOSIS — J329 Chronic sinusitis, unspecified: Secondary | ICD-10-CM | POA: Diagnosis not present

## 2016-10-02 DIAGNOSIS — D849 Immunodeficiency, unspecified: Secondary | ICD-10-CM

## 2016-10-02 DIAGNOSIS — J309 Allergic rhinitis, unspecified: Secondary | ICD-10-CM

## 2016-10-02 DIAGNOSIS — J4530 Mild persistent asthma, uncomplicated: Secondary | ICD-10-CM | POA: Diagnosis not present

## 2016-10-02 DIAGNOSIS — K219 Gastro-esophageal reflux disease without esophagitis: Secondary | ICD-10-CM | POA: Diagnosis not present

## 2016-10-02 DIAGNOSIS — H1010 Acute atopic conjunctivitis, unspecified eye: Secondary | ICD-10-CM

## 2016-10-02 DIAGNOSIS — D899 Disorder involving the immune mechanism, unspecified: Secondary | ICD-10-CM

## 2016-10-02 MED ORDER — MUPIROCIN 2 % EX OINT: 1.0000 "application " | TOPICAL_OINTMENT | Freq: Three times a day (TID) | CUTANEOUS | 0 refills | Status: AC

## 2016-10-02 NOTE — Patient Instructions (Addendum)
    1. Nasal saline several 3 times a day followed by bactroban ointment three times per day for the next 10 days  2. Continue Qvar 80 two inhalations two times per day  3. Use OTC Rhinocort one nasal spray one each nostril one time a day Monday, Wednesday, Friday  4. Continue dexilant 60 in AM + ranitidine 300 in PM  5. Continue montelukast 10 mg one time per day  6. Continue ProAir if needed.   7. Keep visit with Dr. Constance Holster  8. Blood - CBC w/diff, CMP, IgA/G/M, IgE  9. Further evaluation and treatment?

## 2016-10-02 NOTE — Telephone Encounter (Signed)
Patient called and said she spoke to Dr. Nelva Bush on Friday and she was told that she needed to see Dr. Constance Holster. She said it had to be cleared through her insurance and she is unsure if she needs to schedule the appointment or if we will schedule for her. She said she is also still sick with a sinus problem that is giving her migraines and making her throw up.

## 2016-10-02 NOTE — Progress Notes (Signed)
Follow-up Note  Referring Provider: [name], FNP Primary Provider: [name], FNP Date of Office Visit: 10/02/2016  Subjective:   Julie [name] (DOB: 03/26/1966) is a 51 y.o. female who returns to the [facility] on 10/02/2016 in re-evaluation of the following:  HPI: Julie returns to this clinic in reevaluation of her persistent sinus headache and nasal symptoms. She has been stuck in a pattern of persistent nasal symptoms with congestion and bad headache with throbbing quality and nasal congestion with occasional nasal discharge that may be ugly for which she recently saw Dr. Nelva Bush who performed a CT scan of her sinuses which did not identify any significant amount of sinus disease other than minimal mucosal thickening of her sphenoid sinus. In addition, she complains about having echo hearing and ear fullness. She has been scheduled for appointment with her ENT, Dr. Constance Holster, on the 26th of this month.  She's had very little issues with her asthma. She has not required a systemic steroid to treat this condition. She can exercise without any difficulty and does not use any short acting bronchodilator.  Her reflux has been under excellent control without any significant throat issues.  Allergies as of 10/02/2016      Reactions   Aspirin Other (See Comments)   pt taking methotrexate   Augmentin [amoxicillin-pot Clavulanate] Other (See Comments)   G I upset      Medication List      albuterol 108 (90 Base) MCG/ACT inhaler Commonly known as:  PROVENTIL HFA;VENTOLIN HFA Inhale 2 puffs into the lungs every 6 (six) hours as needed for wheezing or shortness of breath.   B-D TB SYRINGE 1CC/27GX1/2" 27G X 1/2" 1 ML Misc Generic drug:  TUBERCULIN SYR 1CC/27GX1/2" Inject 1 Syringe as directed once a week.   beclomethasone 80 MCG/ACT inhaler Commonly known as:  QVAR Use 2 puffs twice daily to prevent cough or wheeze.  Rinse, gargle, and spit after  use.   cetirizine 10 MG tablet Commonly known as:  ZYRTEC Take 10 mg by mouth daily.   DAILY MULTIVITAMIN PO Take by mouth daily.   DEXILANT 60 MG capsule Generic drug:  dexlansoprazole TAKE ONE DAILY IN THE MORNING TO PREVENT REFLUX.   Diclofenac Sodium 3 % Gel Place 1 application onto the skin daily as needed (pain).   escitalopram 10 MG tablet Commonly known as:  LEXAPRO Take 1 tablet by mouth daily.   flunisolide 25 MCG/ACT (0.025%) Soln Commonly known as:  NASALIDE 1 spray each nostril Monday, Wednesday and fridays.   folic acid 1 MG tablet Commonly known as:  FOLVITE Take 1 mg by mouth 2 (two) times daily.   hydroxychloroquine 200 MG tablet Commonly known as:  PLAQUENIL Take by mouth daily.   lidocaine 5 % ointment Commonly known as:  XYLOCAINE apply 1-2 grams to affected area 3-4 times daily as needed   methotrexate 1 g injection Commonly known as:  50 mg/ml Inject 0.8 mg into the vein once.   montelukast 10 MG tablet Commonly known as:  SINGULAIR TAKE 1 TABLET BY MOUTH AT BEDTIME   ranitidine 300 MG tablet Commonly known as:  ZANTAC TAKE 1 TABLET BY MOUTH AT BEDTIME   Vitamin D (Ergocalciferol) 50000 units Caps capsule Commonly known as:  DRISDOL Take 50,000 Units by mouth once a week.       Past Medical History:  Diagnosis Date  . Asthma   . Complication of anesthesia   . Eczema   .  PONV (postoperative nausea and vomiting)   . Rheumatoid arthritis [facility])     Past Surgical History:  Procedure Laterality Date  . BILATERAL SALPINGECTOMY Bilateral 05/25/2015   Procedure: BILATERAL SALPINGECTOMY;  Surgeon: Servando Salina, MD;  Location: [facility];  Service: Gynecology;  Laterality: Bilateral;  . OVARIAN CYST REMOVAL Right 05/25/2015   Procedure: OVARIAN CYSTECTOMY;  Surgeon: Servando Salina, MD;  Location: [facility];  Service: Gynecology;  Laterality: Right;  . TONSILLECTOMY    . TUBAL LIGATION      Review of systems negative except as noted  in HPI / PMHx or noted below:  Review of Systems  Constitutional: Negative.   HENT: Negative.   Eyes: Negative.   Respiratory: Negative.   Cardiovascular: Negative.   Gastrointestinal: Negative.   Genitourinary: Negative.   Musculoskeletal: Negative.   Skin: Negative.   Neurological: Negative.   Endo/Heme/Allergies: Negative.   Psychiatric/Behavioral: Negative.      Objective:   Vitals:   10/02/16 1356  BP: 122/82  Pulse: 72  Resp: 16  Temp: 98.1 F (36.7 C)          Physical Exam  Constitutional: She is well-developed, well-nourished, and in no distress.  HENT:  Head: Normocephalic.  Right Ear: Tympanic membrane, external ear and ear canal normal.  Left Ear: Tympanic membrane, external ear and ear canal normal.  Nose: Mucosal edema (Erythematous) present. No rhinorrhea.  Mouth/Throat: Uvula is midline, oropharynx is clear and moist and mucous membranes are normal. No oropharyngeal exudate.  Eyes: Conjunctivae are normal.  Neck: Trachea normal. No tracheal tenderness present. No tracheal deviation present. No thyromegaly present.  Cardiovascular: Normal rate, regular rhythm, S1 normal, S2 normal and normal heart sounds.   No murmur heard. Pulmonary/Chest: Breath sounds normal. No stridor. No respiratory distress. She has no wheezes. She has no rales.  Musculoskeletal: She exhibits no edema.  Lymphadenopathy:       Head (right side): No tonsillar adenopathy present.       Head (left side): No tonsillar adenopathy present.    She has no cervical adenopathy.  Neurological: She is alert. Gait normal.  Skin: No rash noted. She is not diaphoretic. No erythema. Nails show no clubbing.  Psychiatric: Mood and affect normal.    Diagnostics: Results of a sinus CT scan performed on 09/12/2016 identify the following:  No air-fluid level seen in the paranasal sinuses. Mucosal thickening seen in the right side of the sphenoid sinus. Remainder of the paranasal sinuses are  otherwise normal. The mastoid air cells and middle ears are well aerated. No other suspicious abnormalities identified.  Assessment and Plan:   1. Mild persistent asthma, uncomplicated   2. Allergic rhinoconjunctivitis   3. Chronic sinusitis, unspecified location   4. LPRD (laryngopharyngeal reflux disease)   5. Immunosuppression ([place])      1. Nasal saline several 3 times a day followed by bactroban ointment three times per day for the next 10 days  2. Continue Qvar 80 two inhalations two times per day  3. Use OTC Rhinocort one nasal spray one each nostril one time a day Monday, Wednesday, Friday  4. Continue dexilant 60 in AM + ranitidine 300 in PM  5. Continue montelukast 10 mg one time per day  6. Continue ProAir if needed.   7. Keep visit with Dr. Constance Holster  8. Blood - CBC w/diff, CMP, IgA/G/M, IgE  9. Further evaluation and treatment?  It is possible that some of the symptoms Julie is experiencing is secondary to an  infectious form of rhinitis for which I will have her use topical Bactroban as noted above. I think we need to restart her on a nasal steroid to hopefully prevent the development of significant inflammation developing in her upper airway. She was somewhat intolerant of nasal steroids in the past because of localized irritation and we will try her on nasal budesonide at this point. She'll continue to use therapy directed against both her asthma and her reflux as noted above. Her chronic sinusitis, with minimal involvement affecting the sphenoid sinus, can be evaluated by Dr. Constance Holster for further evaluation. I will check her blood given the fact that she's having so many problems with her airway and there is some evidence of mild chronic sinusitis and she is immunosuppressed because of her rheumatoid arthritis with a combination of methotrexate and hydroxychloroquine. If she does well I will see her back in this clinic in 12 weeks or earlier if there is a problem.  Allena Katz, MD [place]

## 2016-10-02 NOTE — Telephone Encounter (Signed)
Patient advised to call Dr. Constance Holster for an appointment. Also c/o vomiting, fever, ongoing sinus issues. Offered patient appointment 2023/01/17 with Dr. Nelva Bush. Patient stated that she would be dead by 01/17/23. Per Tammy V, patient will be seen by Dr. Neldon Mc today at 2 pm. Patient is okay with that appointment.

## 2016-10-06 LAB — COMPREHENSIVE METABOLIC PANEL
ALT: 17 IU/L (ref 0–32)
AST: 21 IU/L (ref 0–40)
Albumin/Globulin Ratio: 1.4 (ref 1.2–2.2)
Albumin: 4.1 g/dL (ref 3.5–5.5)
Alkaline Phosphatase: 64 IU/L (ref 39–117)
BUN/Creatinine Ratio: 13 (ref 9–23)
BUN: 8 mg/dL (ref 6–24)
Bilirubin Total: 0.4 mg/dL (ref 0.0–1.2)
CO2: 26 mmol/L (ref 18–29)
Calcium: 9.3 mg/dL (ref 8.7–10.2)
Chloride: 100 mmol/L (ref 96–106)
Creatinine, Ser: 0.6 mg/dL (ref 0.57–1.00)
GFR calc Af Amer: 125 mL/min/{1.73_m2} (ref >59)
GFR calc non Af Amer: 108 mL/min/{1.73_m2} (ref >59)
Globulin, Total: 3 g/dL (ref 1.5–4.5)
Glucose: 95 mg/dL (ref 65–99)
Potassium: 4.4 mmol/L (ref 3.5–5.2)
Sodium: 140 mmol/L (ref 134–144)
Total Protein: 7.1 g/dL (ref 6.0–8.5)

## 2016-10-06 LAB — CBC WITH DIFFERENTIAL/PLATELET
Basophils Absolute: 0 10*3/uL (ref 0.0–0.2)
Basos: 1 %
EOS (ABSOLUTE): 0.1 10*3/uL (ref 0.0–0.4)
Eos: 2 %
Hematocrit: 40 % (ref 34.0–46.6)
Hemoglobin: 13.4 g/dL (ref 11.1–15.9)
Immature Grans (Abs): 0 10*3/uL (ref 0.0–0.1)
Immature Granulocytes: 0 %
Lymphocytes Absolute: 1.5 10*3/uL (ref 0.7–3.1)
Lymphs: 32 %
MCH: 28.4 pg (ref 26.6–33.0)
MCHC: 33.5 g/dL (ref 31.5–35.7)
MCV: 85 fL (ref 79–97)
Monocytes Absolute: 0.3 10*3/uL (ref 0.1–0.9)
Monocytes: 7 %
Neutrophils Absolute: 2.7 10*3/uL (ref 1.4–7.0)
Neutrophils: 58 %
Platelets: 266 10*3/uL (ref 150–379)
RBC: 4.72 x10E6/uL (ref 3.77–5.28)
RDW: 12.5 % (ref 12.3–15.4)
WBC: 4.6 10*3/uL (ref 3.4–10.8)

## 2016-10-06 LAB — IGG, IGA, IGM
IgA/Immunoglobulin A, Serum: 188 mg/dL (ref 87–352)
IgG (Immunoglobin G), Serum: 1445 mg/dL (ref 700–1600)
IgM (Immunoglobulin M), Srm: 90 mg/dL (ref 26–217)

## 2016-10-06 LAB — IGE: IgE (Immunoglobulin E), Serum: 119 IU/mL — ABNORMAL HIGH (ref 0–100)

## 2016-10-18 ENCOUNTER — Other Ambulatory Visit: Payer: Self-pay | Admitting: Allergy and Immunology

## 2016-10-18 ENCOUNTER — Other Ambulatory Visit: Payer: Self-pay | Admitting: Family

## 2016-10-18 DIAGNOSIS — Z1231 Encounter for screening mammogram for malignant neoplasm of breast: Secondary | ICD-10-CM

## 2016-10-18 NOTE — Telephone Encounter (Signed)
Left message for patient call office to clarify lab test results.

## 2016-10-18 NOTE — Telephone Encounter (Signed)
Patient said she was given results of blood work last week and was told she had a severe reaction to something. She doesn't remember what. She was told that a prescription was being sent to her pharmacy, [facility], she doesn't know what the medication was because she was sick with the flu. She has checked 3-4 times with her pharmacy and they don't have anything there for her. She would like to know what it was.

## 2016-10-18 NOTE — Telephone Encounter (Signed)
Informed patient of her results again. She stated she could not remember what she was told last week. She would like Tammy to call her with more information on Xolair. Sent message to Tammy.

## 2016-10-31 ENCOUNTER — Telehealth: Payer: Self-pay | Admitting: Allergy and Immunology

## 2016-10-31 MED ORDER — HYDROCOD POLST-CPM POLST ER 10-8 MG/5ML PO SUER: ORAL | 0 refills | Status: DC

## 2016-10-31 NOTE — Telephone Encounter (Signed)
She can use Tussionex 2.5-5.0 mL's every 12 hours as needed for cough. Warning about narcotic. 60 mL no refill. I assume that she is on Tamiflu as well and if not she needs to use Tamiflu 75 mg twice a day for 5 days

## 2016-10-31 NOTE — Telephone Encounter (Signed)
Informed patient script ready for pick up.

## 2016-10-31 NOTE — Telephone Encounter (Signed)
Please advise 

## 2016-10-31 NOTE — Telephone Encounter (Signed)
Informed of Tussionex and she said is there something she can take during the day to help. She will use the Tussionex at night for the cough. She said the flu is better but it has left her with an awful cough. Please advise.

## 2016-10-31 NOTE — Telephone Encounter (Signed)
I have informed patient of this information. Will check on prescription and if not already done then I will get printed.

## 2016-10-31 NOTE — Telephone Encounter (Signed)
Patient said she has had the flu and it left her with a very bad cough. She takes Methotrexate and was told she could only take Tylenol products and she needs a daytime Tylenol cough medicine and cannot find any. Would like advice on what to take.

## 2016-10-31 NOTE — Telephone Encounter (Signed)
Dr. Nelva Bush is going to sign RX for patient because Dr. Neldon Mc has left [place] office for the week. Will notify patient once ready for pick up.

## 2016-10-31 NOTE — Telephone Encounter (Signed)
She can try and use only 1 ml of Tussionex during the daytime or take OTC Mucinex DM two tablets during the daytime

## 2016-11-05 DIAGNOSIS — M0579 Rheumatoid arthritis with rheumatoid factor of multiple sites without organ or systems involvement: Secondary | ICD-10-CM | POA: Insufficient documentation

## 2016-11-05 DIAGNOSIS — Z79899 Other long term (current) drug therapy: Secondary | ICD-10-CM | POA: Insufficient documentation

## 2016-11-05 NOTE — Progress Notes (Signed)
Office Visit Note  Patient: Julie [name]             Date of Birth: 11-Nov-1965           MRN: [id number]             PCP: [name], FNP Referring: [name], FNP Visit Date: 11/07/2016 Occupation: @GUAROCC @    Subjective:  Follow-up Rheumatoid arthritis and high risk prescription follow-up  History of Present Illness: Julie [name] is a 51 y.o. female  Last seen 05/08/2016. Patient states that she was doing well with the rheumatoid arthritis but had to stop recently secondary to a flu. She's currently 2 weeks status post a flu and she is doing much better but only 75% improved overall. During this time she stop the methotrexate and she has flared for her joints in her hands.  She continued taking Plaquenil 200 mg twice a day Monday through Friday. Patient's Plaquenil eye exam was due but her eye doctor was running behind schedule and could not do the Plaquenil eye exam and told the patient to come back in couple of weeks. Patient will call and make that appointment and get the Plaquenil eye exam done and send it to [place].  No other complaint at this time except patient states that her injections of her methotrexate are painful. We showed her the syringes that should be prescribed to her, patient was shocked because that is not the syringes that she was getting lately. She states "they're much bigger and the needle hurts with the last prescription that I filled". We gave her some samples of 1 mL 27-gauge syringes and rewrote the prescription so she can get the proper syringes this time. She does note that the previous prescriptions filled at the pharmacy were with the proper syringes that we gave her today.   Activities of Daily Living:  Patient reports morning stiffness for 45 minutes.   Patient Reports nocturnal pain.  Difficulty dressing/grooming: Denies Difficulty climbing stairs: Denies Difficulty getting out of chair: Denies Difficulty using hands for  taps, buttons, cutlery, and/or writing: Reports   Review of Systems  Constitutional: Negative for fatigue.  HENT: Negative for mouth sores and mouth dryness.   Eyes: Negative for dryness.  Respiratory: Negative for shortness of breath.   Gastrointestinal: Negative for constipation and diarrhea.  Musculoskeletal: Negative for myalgias and myalgias.  Skin: Negative for sensitivity to sunlight.  Psychiatric/Behavioral: Negative for decreased concentration and sleep disturbance.    PMFS History:  Patient Active Problem List   Diagnosis Date Noted  . Rheumatoid arthritis involving multiple sites with positive rheumatoid factor ([place]) 11/05/2016  . High risk medication use 11/05/2016  . LPRD (laryngopharyngeal reflux disease) 09/20/2015  . Mild persistent asthma 09/20/2015  . Allergic rhinoconjunctivitis 09/20/2015  . Acute sinusitis 07/29/2015  . Asthma with acute exacerbation 07/29/2015  . S/P total hysterectomy 05/25/2015    Past Medical History:  Diagnosis Date  . Asthma   . Complication of anesthesia   . Eczema   . PONV (postoperative nausea and vomiting)   . Rheumatoid arthritis ([place])     Family History  Problem Relation Age of Onset  . Asthma Mother   . Eczema Mother   . Allergic rhinitis Neg Hx   . Angioedema Neg Hx   . Immunodeficiency Neg Hx   . Urticaria Neg Hx    Past Surgical History:  Procedure Laterality Date  . BILATERAL SALPINGECTOMY Bilateral 05/25/2015   Procedure: BILATERAL SALPINGECTOMY;  Surgeon: Servando Salina, MD;  Location: [facility];  Service: Gynecology;  Laterality: Bilateral;  . OVARIAN CYST REMOVAL Right 05/25/2015   Procedure: OVARIAN CYSTECTOMY;  Surgeon: Servando Salina, MD;  Location: [facility];  Service: Gynecology;  Laterality: Right;  . TONSILLECTOMY    . TUBAL LIGATION     Social History   Social History Narrative  . No narrative on file     Objective: Vital Signs: BP 133/83   Pulse 77   Resp 13   Ht 5' 1.25" (1.556 m)   Wt  180 lb (81.6 kg)   LMP 05/19/2015   BMI 33.73 kg/m    Physical Exam  Constitutional: She is oriented to person, place, and time. She appears well-developed and well-nourished.  HENT:  Head: Normocephalic and atraumatic.  Eyes: EOM are normal. Pupils are equal, round, and reactive to light.  Cardiovascular: Normal rate, regular rhythm and normal heart sounds.  Exam reveals no gallop and no friction rub.   No murmur heard. Pulmonary/Chest: Effort normal and breath sounds normal. She has no wheezes. She has no rales.  Abdominal: Soft. Bowel sounds are normal. She exhibits no distension. There is no tenderness. There is no guarding. No hernia.  Musculoskeletal: Normal range of motion. She exhibits no edema, tenderness or deformity.  Lymphadenopathy:    She has no cervical adenopathy.  Neurological: She is alert and oriented to person, place, and time. Coordination normal.  Skin: Skin is warm and dry. Capillary refill takes less than 2 seconds. No rash noted.  Psychiatric: She has a normal mood and affect. Her behavior is normal.  Nursing note and vitals reviewed.    Musculoskeletal Exam:  Full range of motion of all joints Grip strength is equal and strong bilaterally Fiber myalgia tender points are all absent  CDAI Exam: CDAI Homunculus Exam:   Tenderness:  Right hand: 2nd MCP and 3rd MCP Left hand: 2nd MCP and 3rd MCP  Swelling:  Right hand: 2nd MCP and 3rd MCP Left hand: 2nd MCP and 3rd MCP  Joint Counts:  CDAI Tender Joint count: 4 CDAI Swollen Joint count: 4  Global Assessments:  Patient Global Assessment: 7 Provider Global Assessment: 7  CDAI Calculated Score: 22    Investigation: Findings:  Labs from 02/03/2016 show CBC with diff is normal.  CMP with GFR is normal January 2017:  CBC and comprehensive metabolic panel were normal.   October 2016:  Her eye exam was normal.  X-rays of bilateral hands 2 views today, showed bilateral intercarpal joint and  radiocarpal joint narrowing but no interval change from March 2013. X-rays of bilateral feet showed bilateral posterior calcaneal spur, bilateral PIP and DIP narrowing, and erosive changes in the bilateral 5th MTP joints but no interval change from March 2013.   Office Visit on 10/02/2016  Component Date Value Ref Range Status  . WBC 10/02/2016 4.6  3.4 - 10.8 x10E3/uL Final  . RBC 10/02/2016 4.72  3.77 - 5.28 x10E6/uL Final  . Hemoglobin 10/02/2016 13.4  11.1 - 15.9 g/dL Final  . Hematocrit 10/02/2016 40.0  34.0 - 46.6 % Final  . MCV 10/02/2016 85  79 - 97 fL Final  . MCH 10/02/2016 28.4  26.6 - 33.0 pg Final  . MCHC 10/02/2016 33.5  31.5 - 35.7 g/dL Final  . RDW 10/02/2016 12.5  12.3 - 15.4 % Final  . Platelets 10/02/2016 266  150 - 379 x10E3/uL Final  . Neutrophils 10/02/2016 58  Not Estab. % Final  . Lymphs  10/02/2016 32  Not Estab. % Final  . Monocytes 10/02/2016 7  Not Estab. % Final  . Eos 10/02/2016 2  Not Estab. % Final  . Basos 10/02/2016 1  Not Estab. % Final  . Neutrophils Absolute 10/02/2016 2.7  1.4 - 7.0 x10E3/uL Final  . Lymphocytes Absolute 10/02/2016 1.5  0.7 - 3.1 x10E3/uL Final  . Monocytes Absolute 10/02/2016 0.3  0.1 - 0.9 x10E3/uL Final  . EOS (ABSOLUTE) 10/02/2016 0.1  0.0 - 0.4 x10E3/uL Final  . Basophils Absolute 10/02/2016 0.0  0.0 - 0.2 x10E3/uL Final  . Immature Granulocytes 10/02/2016 0  Not Estab. % Final  . Immature Grans (Abs) 10/02/2016 0.0  0.0 - 0.1 x10E3/uL Final  . Glucose 10/02/2016 95  65 - 99 mg/dL Final  . BUN 10/02/2016 8  6 - 24 mg/dL Final  . Creatinine, Ser 10/02/2016 0.60  0.57 - 1.00 mg/dL Final  . GFR calc non Af Amer 10/02/2016 108  >59 mL/min/1.73 Final  . GFR calc Af Amer 10/02/2016 125  >59 mL/min/1.73 Final  . BUN/Creatinine Ratio 10/02/2016 13  9 - 23 Final  . Sodium 10/02/2016 140  134 - 144 mmol/L Final  . Potassium 10/02/2016 4.4  3.5 - 5.2 mmol/L Final  . Chloride 10/02/2016 100  96 - 106 mmol/L Final  . CO2 10/02/2016 26   18 - 29 mmol/L Final  . Calcium 10/02/2016 9.3  8.7 - 10.2 mg/dL Final  . Total Protein 10/02/2016 7.1  6.0 - 8.5 g/dL Final  . Albumin 10/02/2016 4.1  3.5 - 5.5 g/dL Final  . Globulin, Total 10/02/2016 3.0  1.5 - 4.5 g/dL Final  . Albumin/Globulin Ratio 10/02/2016 1.4  1.2 - 2.2 Final  . Bilirubin Total 10/02/2016 0.4  0.0 - 1.2 mg/dL Final  . Alkaline Phosphatase 10/02/2016 64  39 - 117 IU/L Final  . AST 10/02/2016 21  0 - 40 IU/L Final  . ALT 10/02/2016 17  0 - 32 IU/L Final  . IgG (Immunoglobin G), Serum 10/02/2016 1445  700 - 1,600 mg/dL Final  . IgA/Immunoglobulin A, Serum 10/02/2016 188  87 - 352 mg/dL Final  . IgM (Immunoglobulin M), Srm 10/02/2016 90  26 - 217 mg/dL Final  . IgE (Immunoglobulin E), Serum 10/02/2016 119* 0 - 100 IU/mL Final      Imaging: No results found.  Speciality Comments: No specialty comments available.    Procedures:  No procedures performed Allergies: Aspirin and Augmentin [amoxicillin-pot clavulanate]   Assessment / Plan:     Visit Diagnoses: Rheumatoid arthritis involving multiple sites with positive rheumatoid factor ([place]) - (+)CCP - Plan: Comprehensive metabolic panel, CBC with Differential/Platelet  High risk medication use - MTX-0.57ml per weekPLQ-200mg  BID Monday thru Friday - Plan: Comprehensive metabolic panel, CBC with Differential/Platelet  Flu syndrome - Nov 07, 2016 ==>pt's flu (x 2 weeks) is resolving; 75% better now;d/c'd mtx x 2 wks   Plan: #1: Rheumatoid arthritis. Well-controlled in the past but flaring currently because of of methotrexate due to the flu. Normally takes methotrexate 0.8 ML's per week; Plaquenil 2 mg twice a day Monday through Friday; folic acid 2 mg daily.  #2: CBC with differential CMP with GFR normal and up-to-date as of January 2018. I placed order for standing orders starting April 2018.  #3: Patient's Plaquenil eye exam is now due. She will have this done in the next couple of weeks. She had been to  the eye doctor but the Plaquenil eye exam could not be done on  that visit because the doctor was behind schedule, and told her to come back in couple of weeks. Patient will send as updated Plaquenil eye exam shortly.  #4: Return to clinic in 5 months  #5: Because the patient is flaring because she is off of the methotrexate, I have given her prescription for prednisone. She will start this medication approximately Saturday. Prednisone 5 mg 4 pills every morning for 4 days, 3 pills 4 days, 2 pills for 4 days, 1 pill for 4 days, half pill 4 and then stop days  Orders: Orders Placed This Encounter  Procedures  . Comprehensive metabolic panel  . CBC with Differential/Platelet   Meds ordered this encounter  Medications  . hydroxychloroquine (PLAQUENIL) 200 MG tablet    Sig: PLQ 200mg  BID  Monday thru Friday    Dispense:  135 tablet    Refill:  1  . B-D TB SYRINGE 1CC/27GX1/2" 27G X 1/2" 1 ML MISC    Sig: Inject 1 Syringe as directed once a week.    Dispense:  16 each    Refill:  3    Order Specific Question:   Supervising Provider    Answer:   Bo Merino [[id number]]  . folic acid (FOLVITE) 1 MG tablet    Sig: Take 1 tablet (1 mg total) by mouth 2 (two) times daily.    Dispense:  180 tablet    Refill:  4    Order Specific Question:   Supervising Provider    Answer:   Bo Merino [[id number]]  . methotrexate 50 MG/2ML injection    Sig: Inject 0.8 mLs (20 mg total) into the skin once a week.    Dispense:  2 mL    Refill:  4    Give 90 day supply with preservatives    Order Specific Question:   Supervising Provider    Answer:   Bo Merino [[id number]]  . predniSONE (DELTASONE) 5 MG tablet    Sig: 4po qAM x 4 days;3po qAM x 4 days;2po qAM x 4 days;1po qAM x 4 days;1/2po qAM x 4 days; then stop.;    Dispense:  42 tablet    Refill:  0    Order Specific Question:   Supervising Provider    Answer:   Lyda Perone    Face-to-face time spent with patient was 30 minutes.  50% of time was spent in counseling and coordination of care.  Follow-Up Instructions: Return in about 5 months (around 04/06/2017) for RA  // MTX.103ml, PLQ 200mg  BID  .   Eliezer Lofts, PA-C  Note - This record has been created using [employer].  Chart creation errors have been sought, but may not always  have been located. Such creation errors do not reflect on  the standard of medical care.

## 2016-11-07 ENCOUNTER — Encounter: Payer: Self-pay | Admitting: Rheumatology

## 2016-11-07 ENCOUNTER — Ambulatory Visit (INDEPENDENT_AMBULATORY_CARE_PROVIDER_SITE_OTHER): Payer: 59 | Admitting: Rheumatology

## 2016-11-07 VITALS — BP 133/83 | HR 77 | Resp 13 | Ht 61.25 in | Wt 180.0 lb

## 2016-11-07 DIAGNOSIS — J111 Influenza due to unidentified influenza virus with other respiratory manifestations: Secondary | ICD-10-CM

## 2016-11-07 DIAGNOSIS — Z79899 Other long term (current) drug therapy: Secondary | ICD-10-CM

## 2016-11-07 DIAGNOSIS — M0579 Rheumatoid arthritis with rheumatoid factor of multiple sites without organ or systems involvement: Secondary | ICD-10-CM

## 2016-11-07 MED ORDER — METHOTREXATE SODIUM CHEMO INJECTION 50 MG/2ML: 20.0000 mg | INTRAMUSCULAR | 4 refills | Status: DC

## 2016-11-07 MED ORDER — "BD TB SYRINGE 27G X 1/2"" 1 ML MISC": 1.0000 | 3 refills | Status: DC

## 2016-11-07 MED ORDER — FOLIC ACID 1 MG PO TABS: 1.0000 mg | ORAL_TABLET | Freq: Two times a day (BID) | ORAL | 4 refills | Status: DC

## 2016-11-07 MED ORDER — PREDNISONE 5 MG PO TABS: ORAL_TABLET | ORAL | 0 refills | Status: DC

## 2016-11-07 MED ORDER — HYDROXYCHLOROQUINE SULFATE 200 MG PO TABS: ORAL_TABLET | ORAL | 1 refills | Status: DC

## 2016-11-14 ENCOUNTER — Other Ambulatory Visit: Payer: Self-pay | Admitting: Allergy and Immunology

## 2016-11-15 ENCOUNTER — Ambulatory Visit (INDEPENDENT_AMBULATORY_CARE_PROVIDER_SITE_OTHER): Payer: 59 | Admitting: *Deleted

## 2016-11-15 DIAGNOSIS — J4540 Moderate persistent asthma, uncomplicated: Secondary | ICD-10-CM

## 2016-11-15 MED ORDER — EPINEPHRINE 0.3 MG/0.3ML IJ SOAJ: 0.3000 mg | Freq: Once | INTRAMUSCULAR | 2 refills | Status: AC

## 2016-11-15 MED ORDER — OMALIZUMAB 150 MG ~~LOC~~ SOLR
300.0000 mg | SUBCUTANEOUS | Status: DC
  Administered 2016-11-15 – 2018-05-01 (×19): 300 mg via SUBCUTANEOUS

## 2016-11-17 ENCOUNTER — Other Ambulatory Visit: Payer: Self-pay | Admitting: Allergy and Immunology

## 2016-11-26 ENCOUNTER — Other Ambulatory Visit: Payer: Self-pay | Admitting: Rheumatology

## 2016-11-26 NOTE — Telephone Encounter (Signed)
Last visit 11/07/16 03/29/17 next visit Labs  WNL 10/02/16 Eye exam  None on file since 2014 Mr Julie [name] advised her in Feb to go for this, but she states she has not gone for this  She states she thinks she has appt sch, there was an emergency at the office and they rescheduled it for tomorrow.   Ok to refill Plaquenil ?

## 2016-11-28 ENCOUNTER — Encounter: Payer: Self-pay | Admitting: Rheumatology

## 2016-11-28 ENCOUNTER — Ambulatory Visit (INDEPENDENT_AMBULATORY_CARE_PROVIDER_SITE_OTHER): Payer: 59 | Admitting: Rheumatology

## 2016-11-28 VITALS — BP 130/74 | HR 90 | Resp 14 | Wt 184.0 lb

## 2016-11-28 DIAGNOSIS — M0579 Rheumatoid arthritis with rheumatoid factor of multiple sites without organ or systems involvement: Secondary | ICD-10-CM

## 2016-11-28 DIAGNOSIS — Z79899 Other long term (current) drug therapy: Secondary | ICD-10-CM | POA: Diagnosis not present

## 2016-11-28 DIAGNOSIS — M79674 Pain in right toe(s): Secondary | ICD-10-CM | POA: Diagnosis not present

## 2016-11-28 DIAGNOSIS — M65332 Trigger finger, left middle finger: Secondary | ICD-10-CM | POA: Diagnosis not present

## 2016-11-28 DIAGNOSIS — M25511 Pain in right shoulder: Secondary | ICD-10-CM | POA: Diagnosis not present

## 2016-11-28 DIAGNOSIS — M79642 Pain in left hand: Secondary | ICD-10-CM | POA: Diagnosis not present

## 2016-11-28 LAB — CBC WITH DIFFERENTIAL/PLATELET
Basophils Absolute: 67 cells/uL (ref 0–200)
Basophils Relative: 1 %
Eosinophils Absolute: 134 cells/uL (ref 15–500)
Eosinophils Relative: 2 %
HCT: 38.1 % (ref 35.0–45.0)
Hemoglobin: 12.6 g/dL (ref 11.7–15.5)
Lymphocytes Relative: 33 %
Lymphs Abs: 2211 cells/uL (ref 850–3900)
MCH: 28 pg (ref 27.0–33.0)
MCHC: 33.1 g/dL (ref 32.0–36.0)
MCV: 84.7 fL (ref 80.0–100.0)
MPV: 10.4 fL (ref 7.5–12.5)
Monocytes Absolute: 603 cells/uL (ref 200–950)
Monocytes Relative: 9 %
Neutro Abs: 3685 cells/uL (ref 1500–7800)
Neutrophils Relative %: 55 %
Platelets: 268 10*3/uL (ref 140–400)
RBC: 4.5 MIL/uL (ref 3.80–5.10)
RDW: 13 % (ref 11.0–15.0)
WBC: 6.7 10*3/uL (ref 3.8–10.8)

## 2016-11-28 LAB — COMPLETE METABOLIC PANEL WITH GFR
ALT: 11 U/L (ref 6–29)
AST: 14 U/L (ref 10–35)
Albumin: 3.9 g/dL (ref 3.6–5.1)
Alkaline Phosphatase: 51 U/L (ref 33–115)
BUN: 8 mg/dL (ref 7–25)
CO2: 28 mmol/L (ref 20–31)
Calcium: 8.8 mg/dL (ref 8.6–10.2)
Chloride: 103 mmol/L (ref 98–110)
Creat: 0.71 mg/dL (ref 0.50–1.10)
GFR, Est African American: >89 mL/min (ref >=60)
GFR, Est Non African American: >89 mL/min (ref >=60)
Glucose, Bld: 88 mg/dL (ref 65–99)
Potassium: 4.6 mmol/L (ref 3.5–5.3)
Sodium: 136 mmol/L (ref 135–146)
Total Bilirubin: 0.3 mg/dL (ref 0.2–1.2)
Total Protein: 6.7 g/dL (ref 6.1–8.1)

## 2016-11-28 MED ORDER — LIDOCAINE HCL 1 % IJ SOLN
0.3000 mL | INTRAMUSCULAR | Status: AC | PRN
  Administered 2016-11-28: .3 mL

## 2016-11-28 MED ORDER — PREDNISONE 5 MG PO TABS: ORAL_TABLET | ORAL | 0 refills | Status: DC

## 2016-11-28 MED ORDER — TRIAMCINOLONE ACETONIDE 40 MG/ML IJ SUSP
10.0000 mg | INTRAMUSCULAR | Status: AC | PRN
  Administered 2016-11-28: 10 mg via INTRA_ARTICULAR

## 2016-11-28 MED ORDER — DICLOFENAC SODIUM 1 % TD GEL: 4.0000 g | Freq: Four times a day (QID) | TRANSDERMAL | 3 refills | Status: AC

## 2016-11-28 NOTE — Progress Notes (Signed)
Office Visit Note  Patient: Julie [name]             Date of Birth: 21-Aug-1966           MRN: [id number]             PCP: [name], FNP Referring: [name], FNP Visit Date: 11/28/2016 Occupation: @GUAROCC @    Subjective:  Pain of the Left Hand Also right shoulder pain and right fourth toe pain  History of Present Illness: Julie Julie [name] is a 51 y.o. female  Patient is complaining of left third finger pain. She noticed that he was having some pain and discomfort at the MCP the PIP joint starting last Thursday. He got little bit worse on Friday. On Saturday it was more painful. She describes the pain on Sunday as 8 on a scale of 0-10. On Sunday the finger locked in her sleep and she was unable to flex it or extend it. She ran it under hot water which allowed her limited mobility. Also, she massaged it.   Patient describes the pain on Monday Tuesday and Wednesdays about a 5-6. Currently it as a 5. She states that the finger draws up on its own. She has to run it under hot water to relieve it of its pain and swelling and cramping. She does not have any injuries. She states that in the old days when she first initially came to [place] there was a rheumatoid nodule there which we injected and has resolved/dissolved and has not reoccurred.  Note that we saw the patient 11/07/2016. At that time she had an upper respiratory type infection. She was off of her methotrexate for some period of time. She has restarted her methotrexate approximately 2 weeks ago During the time she was off of methotrexate and dinnertime that she initially restarted the methotrexate, she did not have a flare. She was doing overall fairly well with no flares. There was mild soreness going on at that time. She did need a prednisone taper which she took as prescribed and took to taper until it was all gone which the taper was completed this Saturday.  Patient also complaining of right shoulder  joint pain. Patient states that her left third finger has been bothering her so much she didn't even pay attention to the right shoulder. Currently she has decreased range of motion she is not able to raise and abductor all the way up in the air without difficulty. She can do it but it is painful.  In addition, she also notes that her right fourth toe is painful. She states that it's big and swollen she cannot even bend it.   Her left third finger has actually preoccupied her with pain and discomfort so much that she neglected the right shoulder and the right second toe.  Activities of Daily Living:  Patient reports morning stiffness for 30 minutes.   Patient Reports nocturnal pain.  Difficulty dressing/grooming: Reports Difficulty climbing stairs: Denies Difficulty getting out of chair: Denies Difficulty using hands for taps, buttons, cutlery, and/or writing: Reports   Review of Systems  Constitutional: Negative for fatigue.  HENT: Negative for mouth sores and mouth dryness.   Eyes: Negative for dryness.  Respiratory: Negative for shortness of breath.   Gastrointestinal: Negative for constipation and diarrhea.  Musculoskeletal: Negative for myalgias and myalgias.  Skin: Negative for sensitivity to sunlight.  Psychiatric/Behavioral: Negative for decreased concentration and sleep disturbance.    PMFS History:  Patient  Active Problem List   Diagnosis Date Noted  . Rheumatoid arthritis involving multiple sites with positive rheumatoid factor ([place]) 11/05/2016  . High risk medication use 11/05/2016  . LPRD (laryngopharyngeal reflux disease) 09/20/2015  . Mild persistent asthma 09/20/2015  . Allergic rhinoconjunctivitis 09/20/2015  . Acute sinusitis 07/29/2015  . Asthma with acute exacerbation 07/29/2015  . S/P total hysterectomy 05/25/2015    Past Medical History:  Diagnosis Date  . Asthma   . Complication of anesthesia   . Eczema   . PONV (postoperative nausea and vomiting)    . Rheumatoid arthritis ([place])     Family History  Problem Relation Age of Onset  . Asthma Mother   . Eczema Mother   . Allergic rhinitis Neg Hx   . Angioedema Neg Hx   . Immunodeficiency Neg Hx   . Urticaria Neg Hx    Past Surgical History:  Procedure Laterality Date  . BILATERAL SALPINGECTOMY Bilateral 05/25/2015   Procedure: BILATERAL SALPINGECTOMY;  Surgeon: Servando Salina, MD;  Location: [facility];  Service: Gynecology;  Laterality: Bilateral;  . OVARIAN CYST REMOVAL Right 05/25/2015   Procedure: OVARIAN CYSTECTOMY;  Surgeon: Servando Salina, MD;  Location: [facility];  Service: Gynecology;  Laterality: Right;  . TONSILLECTOMY    . TUBAL LIGATION     Social History   Social History Narrative  . No narrative on file     Objective: Vital Signs: BP 130/74   Pulse 90   Resp 14   Wt 184 lb (83.5 kg)   LMP 05/19/2015   BMI 34.48 kg/m    Physical Exam  Constitutional: She is oriented to person, place, and time. She appears well-developed and well-nourished.  HENT:  Head: Normocephalic and atraumatic.  Eyes: EOM are normal. Pupils are equal, round, and reactive to light.  Cardiovascular: Normal rate, regular rhythm and normal heart sounds.  Exam reveals no gallop and no friction rub.   No murmur heard. Pulmonary/Chest: Effort normal and breath sounds normal. She has no wheezes. She has no rales.  Abdominal: Soft. Bowel sounds are normal. She exhibits no distension. There is no tenderness. There is no guarding. No hernia.  Musculoskeletal: Normal range of motion. She exhibits no edema, tenderness or deformity.  Lymphadenopathy:    She has no cervical adenopathy.  Neurological: She is alert and oriented to person, place, and time. Coordination normal.  Skin: Skin is warm and dry. Capillary refill takes less than 2 seconds. No rash noted.  Psychiatric: She has a normal mood and affect. Her behavior is normal.  Nursing note and vitals reviewed.    Musculoskeletal Exam:  C-spine and thoracic lumbar spine good range of motion. She is painful range of motion of her right shoulder. Elbow joints wrist joints are good range of motion. No MCP or wrist joint synovitis was noted. She had left third trigger finger with flexor tenosynovitis. Hip joints knee joints ankles MTPs with good range of motion with no synovitis, she has swelling of her right fourth PIP of foot.   CDAI Exam: CDAI Homunculus Exam:   Tenderness:  RUE: glenohumeral Left hand: 3rd MCP and 3rd PIP Right foot: 4th MTP  Joint Counts:  CDAI Tender Joint count: 3 CDAI Swollen Joint count: 0  Global Assessments:  Patient Global Assessment: 8 Provider Global Assessment: 8  CDAI Calculated Score: 19    Investigation: No additional findings.  Office Visit on 10/02/2016  Component Date Value Ref Range Status  . WBC 10/02/2016 4.6  3.4 - 10.8  x10E3/uL Final  . RBC 10/02/2016 4.72  3.77 - 5.28 x10E6/uL Final  . Hemoglobin 10/02/2016 13.4  11.1 - 15.9 g/dL Final  . Hematocrit 10/02/2016 40.0  34.0 - 46.6 % Final  . MCV 10/02/2016 85  79 - 97 fL Final  . MCH 10/02/2016 28.4  26.6 - 33.0 pg Final  . MCHC 10/02/2016 33.5  31.5 - 35.7 g/dL Final  . RDW 10/02/2016 12.5  12.3 - 15.4 % Final  . Platelets 10/02/2016 266  150 - 379 x10E3/uL Final  . Neutrophils 10/02/2016 58  Not Estab. % Final  . Lymphs 10/02/2016 32  Not Estab. % Final  . Monocytes 10/02/2016 7  Not Estab. % Final  . Eos 10/02/2016 2  Not Estab. % Final  . Basos 10/02/2016 1  Not Estab. % Final  . Neutrophils Absolute 10/02/2016 2.7  1.4 - 7.0 x10E3/uL Final  . Lymphocytes Absolute 10/02/2016 1.5  0.7 - 3.1 x10E3/uL Final  . Monocytes Absolute 10/02/2016 0.3  0.1 - 0.9 x10E3/uL Final  . EOS (ABSOLUTE) 10/02/2016 0.1  0.0 - 0.4 x10E3/uL Final  . Basophils Absolute 10/02/2016 0.0  0.0 - 0.2 x10E3/uL Final  . Immature Granulocytes 10/02/2016 0  Not Estab. % Final  . Immature Grans (Abs) 10/02/2016 0.0  0.0 - 0.1 x10E3/uL Final  .  Glucose 10/02/2016 95  65 - 99 mg/dL Final  . BUN 10/02/2016 8  6 - 24 mg/dL Final  . Creatinine, Ser 10/02/2016 0.60  0.57 - 1.00 mg/dL Final  . GFR calc non Af Amer 10/02/2016 108  >59 mL/min/1.73 Final  . GFR calc Af Amer 10/02/2016 125  >59 mL/min/1.73 Final  . BUN/Creatinine Ratio 10/02/2016 13  9 - 23 Final  . Sodium 10/02/2016 140  134 - 144 mmol/L Final  . Potassium 10/02/2016 4.4  3.5 - 5.2 mmol/L Final  . Chloride 10/02/2016 100  96 - 106 mmol/L Final  . CO2 10/02/2016 26  18 - 29 mmol/L Final  . Calcium 10/02/2016 9.3  8.7 - 10.2 mg/dL Final  . Total Protein 10/02/2016 7.1  6.0 - 8.5 g/dL Final  . Albumin 10/02/2016 4.1  3.5 - 5.5 g/dL Final  . Globulin, Total 10/02/2016 3.0  1.5 - 4.5 g/dL Final  . Albumin/Globulin Ratio 10/02/2016 1.4  1.2 - 2.2 Final  . Bilirubin Total 10/02/2016 0.4  0.0 - 1.2 mg/dL Final  . Alkaline Phosphatase 10/02/2016 64  39 - 117 IU/L Final  . AST 10/02/2016 21  0 - 40 IU/L Final  . ALT 10/02/2016 17  0 - 32 IU/L Final  . IgG (Immunoglobin G), Serum 10/02/2016 1445  700 - 1,600 mg/dL Final  . IgA/Immunoglobulin A, Serum 10/02/2016 188  87 - 352 mg/dL Final  . IgM (Immunoglobulin M), Srm 10/02/2016 90  26 - 217 mg/dL Final  . IgE (Immunoglobulin E), Serum 10/02/2016 119* 0 - 100 IU/mL Final     Imaging: No results found.   RIGHT 4TH TOE SWELLING FOR  LAST 7 DAYS      RIGHT 4TH TOE Swelling FOR  LAST 7 DAYS   Speciality Comments: No specialty comments available.    Procedures:  Small Joint Inj Date/Time: 11/28/2016 2:51 PM Performed by: Eliezer Lofts Authorized by: Eliezer Lofts   Location:  Long finger Approach:  Volar Ultrasound Guided: Yes   Fluoroscopic Guidance: No   Medications:  0.3 mL lidocaine 1 %; 10 mg triamcinolone acetonide 40 MG/ML Patient tolerance:  Patient tolerated the procedure well with no immediate  complications Comments: Left third trigger finger. With ultrasound guidance, Dr. Estanislado Pandy injected the  left third palmar aspect. Patient tolerated procedure well. 10 mg of Kenalog mixed with 0.3 muscle 1% lidocaine without epinephrine were done under ultrasound guidance   Allergies: Aspirin and Augmentin [amoxicillin-pot clavulanate]   Assessment / Plan:     Visit Diagnoses: Rheumatoid arthritis involving multiple sites with positive rheumatoid factor (HCC) - Positive rheumatoid factor; positive CCP  High risk medication use - Methotrexate, 8 per week; folic acid 2 mg daily; Plaquenil 200 mg twice a day Monday through Friday - Plan: COMPLETE METABOLIC PANEL WITH GFR, CBC with Differential/Platelet  Pain in left hand  Acute pain of right shoulder  Pain of toe of right foot - 11/28/2016: Dactylitis right fourth toe 7 days (new symptom)  Trigger middle finger of left hand - Plan: Small Joint Injection/Arthrocentesis, lidocaine (XYLOCAINE) 1 % (with pres) injection 0.3 mL, triamcinolone acetonide (KENALOG-40) injection 10 mg  High risk medication use - Plan: COMPLETE METABOLIC PANEL WITH GFR, CBC with Differential/Platelet   Plan: #1: Rheumatoid arthritis. I believe patient is currently having another flare after tapering off of her prednisone taper given at the last visit. She did relatively well with everything until she came on the lower dose of prednisone. This past Thursday she started having pain in her left third finger. She describes the pain as 8 on a scale of 0-10. Differential diagnosis includes trigger finger versus rheumatoid arthritis flare of that same finger. It is possible that both are happening concurrently.  #2: Left third trigger finger. See procedure note for full details. 10 mg of Kenalog mixed with 0.3 muscle 1% lidocaine Patient tolerated procedure well.  #3: Continue methotrexate 0.8 ML's every week; folic acid 2 mg every day; Plaquenil 200 mg twice a day Monday through Friday only. I believe the patient is tolerating this medication well. It is helping her  but at this time she had stopped it and I believe she's flared and she hasn't quite recovered just yet. If this continues to linger and she has another issue next month or 2, we will consider more aggressive therapy  #4: Right fourth toe PIP swelling. No evidence of psoriasis. No evidence of nail pitting   #5: Right shoulder joint pain. I believe this is also due to flare of her rheumatoid arthritis. Therefore she will benefit from a prednisone taper since she's having problems and issues to the left third finger right shoulder and right fourth toe PIP swelling.  #6: Prednisone taper See order below for full details  #7: She has adequate amount of methotrexate, folic acid, Plaquenil at home at this time  Orders: Orders Placed This Encounter  Procedures  . Small Joint Injection/Arthrocentesis   Meds ordered this encounter  Medications  . predniSONE (DELTASONE) 5 MG tablet    Sig: 4po qAM x 4 days;3po qAM x 4 days;2po qAM x 4 days;1po qAM x 4 days;1/2po qAM x 4 days; then stop.;    Dispense:  42 tablet    Refill:  0    Order Specific Question:   Supervising Provider    Answer:   Bo Merino [[id number]]  . diclofenac sodium (VOLTAREN) 1 % GEL    Sig: Apply 4 g topically 4 (four) times daily. Voltaren Gel 3 grams to 3 large joints upto TID 3 TUBES with 3 refills    Dispense:  3 Tube    Refill:  3    Voltaren Gel 3 grams to 3 large  joints upto TID 3 TUBES with 3 refills    Order Specific Question:   Supervising Provider    Answer:   Bo Merino [[id number]]  . lidocaine (XYLOCAINE) 1 % (with pres) injection 0.3 mL  . triamcinolone acetonide (KENALOG-40) injection 10 mg    Face-to-face time spent with patient was 30 minutes. 50% of time was spent in counseling and coordination of care.  Follow-Up Instructions: Return in about 4 months (around 03/30/2017) for RA, RECHECK LEFT 4TH TOE, LEFT 3RD TRIGGER FINER, MTX 0.8, PLQ 200 BID, M-F.   Bo Merino, MD  Patient had left  third trigger finger on examination. After different treatment options were discussed the flexor tendon sheath was injected with cortisone as described above she tolerated the procedure well. She was also having mild flare of her arthritis with left fourth toe swelling of PIP joint. I examined and evaluated the patient with Eliezer Lofts PA. The plan of care was discussed as noted above.  Bo Merino, MDNote - This record has been created using [employer].  Chart creation errors have been sought, but may not always  have been located. Such creation errors do not reflect on  the standard of medical care.

## 2016-11-29 NOTE — Progress Notes (Signed)
Labs normal.

## 2016-12-05 ENCOUNTER — Ambulatory Visit: Payer: 59

## 2016-12-17 ENCOUNTER — Other Ambulatory Visit: Payer: Self-pay | Admitting: Allergy and Immunology

## 2016-12-18 ENCOUNTER — Ambulatory Visit: Payer: 59 | Admitting: Allergy and Immunology

## 2016-12-19 ENCOUNTER — Ambulatory Visit (INDEPENDENT_AMBULATORY_CARE_PROVIDER_SITE_OTHER): Payer: 59 | Admitting: *Deleted

## 2016-12-19 DIAGNOSIS — J4540 Moderate persistent asthma, uncomplicated: Secondary | ICD-10-CM | POA: Diagnosis not present

## 2016-12-24 ENCOUNTER — Ambulatory Visit
Admission: RE | Admit: 2016-12-24 | Discharge: 2016-12-24 | Disposition: A | Discharge status: Home / Self Care | Payer: 59 | Source: Ambulatory Visit | Attending: Family | Admitting: Family

## 2016-12-24 ENCOUNTER — Ambulatory Visit: Payer: 59

## 2016-12-24 DIAGNOSIS — Z1231 Encounter for screening mammogram for malignant neoplasm of breast: Secondary | ICD-10-CM

## 2016-12-25 ENCOUNTER — Ambulatory Visit: Payer: 59 | Admitting: Allergy and Immunology

## 2016-12-28 ENCOUNTER — Other Ambulatory Visit: Payer: Self-pay | Admitting: Rheumatology

## 2016-12-28 NOTE — Telephone Encounter (Signed)
Last visit 11/07/16 next visit7/13/18  Labs  WNL 11/28/16 Eye exam  None on file since 2014 Patient is scheduled for 01/15/17 for [facility] to refill 30 day supply PLQ?

## 2017-01-15 ENCOUNTER — Ambulatory Visit: Payer: 59 | Admitting: Allergy and Immunology

## 2017-01-15 ENCOUNTER — Ambulatory Visit: Payer: 59

## 2017-01-15 DIAGNOSIS — J309 Allergic rhinitis, unspecified: Secondary | ICD-10-CM

## 2017-01-17 ENCOUNTER — Telehealth: Payer: Self-pay | Admitting: Allergy & Immunology

## 2017-01-17 ENCOUNTER — Other Ambulatory Visit: Payer: Self-pay | Admitting: *Deleted

## 2017-01-17 ENCOUNTER — Ambulatory Visit (INDEPENDENT_AMBULATORY_CARE_PROVIDER_SITE_OTHER): Payer: 59 | Admitting: Allergy & Immunology

## 2017-01-17 ENCOUNTER — Encounter: Payer: Self-pay | Admitting: Allergy & Immunology

## 2017-01-17 VITALS — BP 118/70 | HR 88 | Resp 16 | Ht 61.5 in | Wt 188.0 lb

## 2017-01-17 DIAGNOSIS — R519 Headache, unspecified: Secondary | ICD-10-CM

## 2017-01-17 DIAGNOSIS — J4540 Moderate persistent asthma, uncomplicated: Secondary | ICD-10-CM | POA: Diagnosis not present

## 2017-01-17 DIAGNOSIS — K219 Gastro-esophageal reflux disease without esophagitis: Secondary | ICD-10-CM | POA: Diagnosis not present

## 2017-01-17 DIAGNOSIS — J302 Other seasonal allergic rhinitis: Secondary | ICD-10-CM

## 2017-01-17 DIAGNOSIS — R51 Headache: Secondary | ICD-10-CM

## 2017-01-17 DIAGNOSIS — G8929 Other chronic pain: Secondary | ICD-10-CM

## 2017-01-17 DIAGNOSIS — J4530 Mild persistent asthma, uncomplicated: Secondary | ICD-10-CM | POA: Diagnosis not present

## 2017-01-17 DIAGNOSIS — J329 Chronic sinusitis, unspecified: Secondary | ICD-10-CM

## 2017-01-17 MED ORDER — METHYLPREDNISOLONE ACETATE 80 MG/ML IJ SUSP
80.0000 mg | Freq: Once | INTRAMUSCULAR | Status: AC
  Administered 2017-01-17: 80 mg via INTRAMUSCULAR

## 2017-01-17 NOTE — Patient Instructions (Addendum)
1. Headache - likely allergy mediated - We will give you a steroid injection today: 80mg  once. - Continue with nasal saline sprays. - I would like you to get a sinus culture so that we can provide more targeted antibiotic therapy, if indicated. - I will talk to Dr. Janeice Robinson office to try to get you in tomorrow to get this done. - Continue with cetirizine 10mg  daily and montelukast 10mg  daily.  2. Return in about 4 weeks (around 02/12/2017).  Please inform us of any Emergency Department visits, hospitalizations, or changes in symptoms. Call us before going to the ED for breathing or allergy symptoms since we might be able to fit you in for a sick visit. Feel free to contact us anytime with any questions, problems, or concerns.  It was a pleasure to meet you today! Happy spring!   Websites that have reliable patient information: 1. American Academy of Asthma, Allergy, and Immunology: www.aaaai.org 2. Food Allergy Research and Education (FARE): foodallergy.org 3. Mothers of Asthmatics: http://www.asthmacommunitynetwork.org 4. American College of Allergy, Asthma, and Immunology: www.acaai.org

## 2017-01-17 NOTE — Progress Notes (Signed)
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 Headache - likely allergy mediated - We will give you a steroid injection today: 80mg   - There was some minor discharge within the right nasal cavity, but otherwise there was no evidence of bacterial sinusitis. - She has a recent CT scan in December 2017 that was essentially negative. - Therefore I would prefer to obtain a sinus culture to provide more targeted antimicrobial therapy.  - Continue with nasal saline sprays. - Continue with cetirizine 10mg  daily and montelukast 10mg  daily.  2. Return in about 4 weeks (around 02/12/2017).   Subjective:   Julie [name] is a 51 y.o. female presenting today for follow up of  Chief Complaint  Patient presents with  . Headache    believes it is either allergies or high blood pressure.     Julie [name] has a history of the following: Patient Active Problem List   Diagnosis Date Noted  . Rheumatoid arthritis involving multiple sites with positive rheumatoid factor ([place]) 11/05/2016  . High risk medication use 11/05/2016  . LPRD (laryngopharyngeal reflux disease) 09/20/2015  . Mild persistent asthma 09/20/2015  . Allergic rhinoconjunctivitis 09/20/2015  . Acute sinusitis 07/29/2015  . Asthma with acute exacerbation 07/29/2015  . S/P total hysterectomy 05/25/2015    History obtained from: chart review and patient.  Julie [name] was referred by [name], FNP.     Ms. Pires's phone number: [phone]  Julie is a 51 y.o. female presenting for a sick visit. She is a patient of Dr Neldon Mc, who has followed for her years. She has a history of mild persistent asthma which is well controlled per the most recent note in January 2018. She has a history of  persistent nasal disease. She has had a continuous series of headaches with sinus congestion. She had a sinus CT performed in December 2017 which was essentially negative. She has seen Dr. Constance Holster who felt that she needed to focus on nasal saline irrigation and stop her nasal steroids. She has a history of reflux and is on the Dexilant 60 mg daily. She also has rheumatoid arthritis and is on MTX (20mg  weekly) and Plaquenil.   Since the last visit, she has mostly done well. She reports that she is having a splitting headache persistently since that time. She has "inflamed" sinuses for the past two weeks without improvement. She has had a gritty sensation in her eyes. She did see an ophathmologist for an evaluation on Tuesday and she had a clean bill of health (she is on Plaquenil). Her ophthalmologist felt that it might just be dry eyes but saw no evidence of medication-related eye damage. She has been using Sustain eye drops without much improvement. She then used allergy eye drops with burning but clearance of the vision. Today, her friend at work took her blood pressure and it was elevated to the 732K systolic, but this was a wrist BP cuff and her blood pressure here in the clinic was completely normal.   Ms. Leppert does have a history of chronic headaches, which have been diagnosed as migraines in the past. Evidently according to the patient, se went to see a headache specialists years ago and it was felt to be secondary to food allergies. She has not had migraines  since that time.    Her asthma is under good control. She remains on the Xolair which she has been on for a matter of months. Otherwise, there have been no changes to her past medical history, surgical history, family history, or social history.    Review of Systems: a 14-point review of systems is pertinent for what is mentioned in HPI.  Otherwise, all other systems were negative. Constitutional: negative other than that listed in the  HPI Eyes: negative other than that listed in the HPI Ears, nose, mouth, throat, and face: negative other than that listed in the HPI Respiratory: negative other than that listed in the HPI Cardiovascular: negative other than that listed in the HPI Gastrointestinal: negative other than that listed in the HPI Genitourinary: negative other than that listed in the HPI Integument: negative other than that listed in the HPI Hematologic: negative other than that listed in the HPI Musculoskeletal: negative other than that listed in the HPI Neurological: negative other than that listed in the HPI Allergy/Immunologic: negative other than that listed in the HPI    Objective:   Blood pressure 118/70, pulse 88, resp. rate 16, height 5' 1.5" (1.562 m), weight 188 lb (85.3 kg), last menstrual period 05/19/2015, SpO2 97 %. Body mass index is 34.95 kg/m.   Physical Exam:  General: Alert, interactive, in no acute distress. Very pleasant. Eyes: No conjunctival injection present on the right, No conjunctival injection present on the left, PERRL bilaterally, No discharge on the right, No discharge on the left and No Horner-Trantas dots present Ears: Right TM pearly gray with normal light reflex, Left TM pearly gray with normal light reflex, Right TM intact without perforation and Left TM intact without perforation.  Nose/Throat: External nose within normal limits, nasal crease present and septum midline, turbinates mildly edematous scant white discharge in the right nasal cavity, post-pharynx erythematous without cobblestoning in the posterior oropharynx. Tonsils 2+ without exudates Neck: Supple without thyromegaly. Lungs: Clear to auscultation without wheezing, rhonchi or rales. No increased work of breathing. CV: Normal S1/S2, no murmurs. Capillary refill <2 seconds.  Skin: Warm and dry, without lesions or rashes. Neuro:   Grossly intact. No focal deficits appreciated. Responsive to  questions.   Diagnostic studies: none     Salvatore Marvel, MD [facility]

## 2017-01-17 NOTE — Telephone Encounter (Signed)
Pt came in and wanted to know what she owe and I told her there was a no show charge for 01/15/2017 and she said said called and was told there was no appointment this week. [place] #[phone].

## 2017-01-18 NOTE — Telephone Encounter (Signed)
Called pt & advised that we adjusted no show fee - kt

## 2017-01-18 NOTE — Progress Notes (Signed)
FYI

## 2017-02-01 NOTE — Progress Notes (Signed)
Reviewed notes from Dr. Janeice Robinson visit. He did not appreciate any kind of infection in her sinuses. 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 Note scanned into Epic.  Salvatore Marvel, MD [facility]

## 2017-02-12 ENCOUNTER — Ambulatory Visit (INDEPENDENT_AMBULATORY_CARE_PROVIDER_SITE_OTHER): Payer: 59 | Admitting: *Deleted

## 2017-02-12 DIAGNOSIS — J4540 Moderate persistent asthma, uncomplicated: Secondary | ICD-10-CM | POA: Diagnosis not present

## 2017-03-08 ENCOUNTER — Other Ambulatory Visit: Payer: Self-pay | Admitting: Rheumatology

## 2017-03-08 NOTE — Telephone Encounter (Signed)
Last Visit: 11/07/16 Next Visit:03/29/17 Labs: 11/28/16 WNL   PLQ Eye Exam: 01/2017 WNL  Okay to refill PLQ

## 2017-03-08 NOTE — Telephone Encounter (Signed)
ok 

## 2017-03-12 ENCOUNTER — Ambulatory Visit (INDEPENDENT_AMBULATORY_CARE_PROVIDER_SITE_OTHER): Payer: 59 | Admitting: *Deleted

## 2017-03-12 DIAGNOSIS — J4540 Moderate persistent asthma, uncomplicated: Secondary | ICD-10-CM | POA: Diagnosis not present

## 2017-03-26 ENCOUNTER — Encounter: Payer: Self-pay | Admitting: Rheumatology

## 2017-03-26 ENCOUNTER — Ambulatory Visit (INDEPENDENT_AMBULATORY_CARE_PROVIDER_SITE_OTHER): Payer: 59 | Admitting: Rheumatology

## 2017-03-26 ENCOUNTER — Ambulatory Visit: Payer: 59 | Admitting: Rheumatology

## 2017-03-26 VITALS — BP 133/79 | HR 74 | Resp 14 | Ht 61.25 in | Wt 187.0 lb

## 2017-03-26 DIAGNOSIS — F439 Reaction to severe stress, unspecified: Secondary | ICD-10-CM

## 2017-03-26 DIAGNOSIS — M25571 Pain in right ankle and joints of right foot: Secondary | ICD-10-CM | POA: Diagnosis not present

## 2017-03-26 DIAGNOSIS — Z79899 Other long term (current) drug therapy: Secondary | ICD-10-CM | POA: Diagnosis not present

## 2017-03-26 DIAGNOSIS — M0579 Rheumatoid arthritis with rheumatoid factor of multiple sites without organ or systems involvement: Secondary | ICD-10-CM | POA: Diagnosis not present

## 2017-03-26 DIAGNOSIS — M25572 Pain in left ankle and joints of left foot: Secondary | ICD-10-CM

## 2017-03-26 LAB — CBC WITH DIFFERENTIAL/PLATELET
Basophils Absolute: 37 cells/uL (ref 0–200)
Basophils Relative: 1 %
Eosinophils Absolute: 74 cells/uL (ref 15–500)
Eosinophils Relative: 2 %
HCT: 36.2 % (ref 35.0–45.0)
Hemoglobin: 11.8 g/dL (ref 11.7–15.5)
Lymphocytes Relative: 43 %
Lymphs Abs: 1591 cells/uL (ref 850–3900)
MCH: 28.8 pg (ref 27.0–33.0)
MCHC: 32.6 g/dL (ref 32.0–36.0)
MCV: 88.3 fL (ref 80.0–100.0)
MPV: 9.8 fL (ref 7.5–12.5)
Monocytes Absolute: 222 cells/uL (ref 200–950)
Monocytes Relative: 6 %
Neutro Abs: 1776 cells/uL (ref 1500–7800)
Neutrophils Relative %: 48 %
Platelets: 261 10*3/uL (ref 140–400)
RBC: 4.1 MIL/uL (ref 3.80–5.10)
RDW: 12.9 % (ref 11.0–15.0)
WBC: 3.7 10*3/uL — ABNORMAL LOW (ref 3.8–10.8)

## 2017-03-26 MED ORDER — FOLIC ACID 1 MG PO TABS: 1.0000 mg | ORAL_TABLET | Freq: Two times a day (BID) | ORAL | 4 refills | Status: DC

## 2017-03-26 MED ORDER — METHOTREXATE SODIUM CHEMO INJECTION 50 MG/2ML: 20.0000 mg | INTRAMUSCULAR | 4 refills | Status: DC

## 2017-03-26 MED ORDER — "BD TB SYRINGE 27G X 1/2"" 1 ML MISC": 1.0000 | 3 refills | Status: DC

## 2017-03-26 MED ORDER — PREDNISONE 5 MG PO TABS: ORAL_TABLET | ORAL | 0 refills | Status: DC
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
## 2017-03-27 LAB — COMPLETE METABOLIC PANEL WITH GFR
ALT: 13 U/L (ref 6–29)
AST: 15 U/L (ref 10–35)
Albumin: 3.7 g/dL (ref 3.6–5.1)
Alkaline Phosphatase: 55 U/L (ref 33–115)
BUN: 11 mg/dL (ref 7–25)
CO2: 24 mmol/L (ref 20–31)
Calcium: 8.9 mg/dL (ref 8.6–10.2)
Chloride: 103 mmol/L (ref 98–110)
Creat: 0.68 mg/dL (ref 0.50–1.10)
GFR, Est African American: >89 mL/min (ref >=60)
GFR, Est Non African American: >89 mL/min (ref >=60)
Glucose, Bld: 100 mg/dL — ABNORMAL HIGH (ref 65–99)
Potassium: 4.2 mmol/L (ref 3.5–5.3)
Sodium: 138 mmol/L (ref 135–146)
Total Bilirubin: 0.4 mg/dL (ref 0.2–1.2)
Total Protein: 6.6 g/dL (ref 6.1–8.1)

## 2017-03-29 ENCOUNTER — Ambulatory Visit: Payer: 59 | Admitting: Rheumatology

## 2017-04-09 ENCOUNTER — Ambulatory Visit (INDEPENDENT_AMBULATORY_CARE_PROVIDER_SITE_OTHER): Payer: 59 | Admitting: *Deleted

## 2017-04-09 DIAGNOSIS — J4540 Moderate persistent asthma, uncomplicated: Secondary | ICD-10-CM

## 2017-04-25 ENCOUNTER — Other Ambulatory Visit: Payer: Self-pay | Admitting: Rheumatology

## 2017-04-26 ENCOUNTER — Other Ambulatory Visit: Payer: Self-pay

## 2017-04-26 MED ORDER — BECLOMETHASONE DIPROP HFA 80 MCG/ACT IN AERB: 2.0000 | INHALATION_SPRAY | Freq: Two times a day (BID) | RESPIRATORY_TRACT | 5 refills | Status: DC

## 2017-04-26 NOTE — Telephone Encounter (Signed)
Refills for Qvar redihaler sent to pharmacy. Pt came in for teaching regarding the redihaler. Pt had no questions and properly demonstrated back to me.

## 2017-05-07 ENCOUNTER — Ambulatory Visit (INDEPENDENT_AMBULATORY_CARE_PROVIDER_SITE_OTHER): Payer: 59 | Admitting: *Deleted

## 2017-05-07 DIAGNOSIS — J4540 Moderate persistent asthma, uncomplicated: Secondary | ICD-10-CM | POA: Diagnosis not present

## 2017-05-27 ENCOUNTER — Telehealth: Payer: Self-pay | Admitting: Rheumatology

## 2017-05-27 NOTE — Telephone Encounter (Signed)
Patient has been taking MTX, Plaquenil, and Folic Acid as prescribed. Patient is still hurting, and wants to know what she can do to help with pain. Please call to advise.

## 2017-05-28 NOTE — Telephone Encounter (Signed)
Patient has been scheduled for 2:30 pm on 05/29/17 as we had a cancellation on our schedule. Patient will come in to discuss treatment options.

## 2017-05-28 NOTE — Telephone Encounter (Signed)
Patient states she is on MTX and PLQ and using as prescribed. Patient states she is having a flare in her wrists, ankles, feet, shoulders and fingers. Patient states she is having pain and swelling. Patient states she is also having pain in her lower back. Patient states she is having trouble sleeping due to her pain. Patient states she is constantly staying sore even on her medications. Patient states she is struggling to even get dressed. Patient is working and going to school. Please advise.

## 2017-05-28 NOTE — Telephone Encounter (Signed)
We have no open appointments. Advise appointment with PCP for evaluation. Please sch  a future appoint to change Tt.

## 2017-05-28 NOTE — Telephone Encounter (Signed)
Attempted to contact the patient and left message for patient to call the office.  

## 2017-05-28 NOTE — Progress Notes (Signed)
so  Office Visit Note  Patient: Julie [name]             Date of Birth: Feb 01, 1966           MRN: [id number]             PCP: [name], FNP Referring: [name], FNP Visit Date: 05/29/2017 Occupation: @GUAROCC @    Subjective:  Increased joint pain and swelling.  History of Present Illness: Julie [name] is a 52 y.o. female with history of rheumatoid arthritis. She states for the last 1 year she's been having intermittent joint pain. Her symptoms are getting worse and now she has ongoing pain and discomfort in multiple joints. She describes pain in her left shoulder and bilateral wrist in bilateral hands. She also has discomfort in her bilateral knees and bilateral feet.  Activities of Daily Living:  Patient reports morning stiffness for  10 minutes.   Patient Reports nocturnal pain.  Difficulty dressing/grooming: Reports Difficulty climbing stairs: Reports Difficulty getting out of chair: Denies Difficulty using hands for taps, buttons, cutlery, and/or writing: Reports   Review of Systems  Constitutional: Positive for fatigue. Negative for night sweats, weight gain, weight loss and weakness.  HENT: Negative.  Negative for mouth sores, trouble swallowing, trouble swallowing, mouth dryness and nose dryness.   Eyes: Positive for dryness. Negative for pain, redness and visual disturbance.  Respiratory: Negative.  Negative for cough, shortness of breath and difficulty breathing.   Cardiovascular: Negative for chest pain, palpitations, hypertension, irregular heartbeat and swelling in legs/feet.  Gastrointestinal: Negative for blood in stool, constipation and diarrhea.  Endocrine: Negative for increased urination.  Genitourinary: Negative for vaginal dryness.  Musculoskeletal: Positive for arthralgias, joint pain, joint swelling and morning stiffness. Negative for myalgias, muscle weakness, muscle tenderness and myalgias.  Skin: Negative.  Negative for color  change, rash, hair loss, skin tightness, ulcers and sensitivity to sunlight.  Allergic/Immunologic: Negative for susceptible to infections.  Neurological: Negative for dizziness, numbness, headaches, memory loss and night sweats.  Hematological: Negative for swollen glands.  Psychiatric/Behavioral: Positive for sleep disturbance. Negative for depressed mood. The patient is not nervous/anxious.     PMFS History:  Patient Active Problem List   Diagnosis Date Noted  . Rheumatoid arthritis involving multiple sites with positive rheumatoid factor ([place]) 11/05/2016  . High risk medication use 11/05/2016  . LPRD (laryngopharyngeal reflux disease) 09/20/2015  . Mild persistent asthma 09/20/2015  . Allergic rhinoconjunctivitis 09/20/2015  . Acute sinusitis 07/29/2015  . Asthma with acute exacerbation 07/29/2015  . S/P total hysterectomy 05/25/2015    Past Medical History:  Diagnosis Date  . Asthma   . Complication of anesthesia   . Eczema   . PONV (postoperative nausea and vomiting)   . Rheumatoid arthritis ([place])     Family History  Problem Relation Age of Onset  . Asthma Mother   . Eczema Mother   . Allergic rhinitis Neg Hx   . Angioedema Neg Hx   . Immunodeficiency Neg Hx   . Urticaria Neg Hx    Past Surgical History:  Procedure Laterality Date  . ABDOMINAL HYSTERECTOMY     partial age 69  . BILATERAL SALPINGECTOMY Bilateral 05/25/2015   Procedure: BILATERAL SALPINGECTOMY;  Surgeon: Servando Salina, MD;  Location: [facility];  Service: Gynecology;  Laterality: Bilateral;  . OVARIAN CYST REMOVAL Right 05/25/2015   Procedure: OVARIAN CYSTECTOMY;  Surgeon: Servando Salina, MD;  Location: [facility];  Service: Gynecology;  Laterality: Right;  .  TONSILLECTOMY    . TUBAL LIGATION     Social History   Social History Narrative  . No narrative on file     Objective: Vital Signs: Ht 5\' 1"  (1.549 m)   Wt 194 lb (88 kg)   LMP 05/19/2015   BMI 36.66 kg/m    Physical Exam    Constitutional: She is oriented to person, place, and time. She appears well-developed and well-nourished.  HENT:  Head: Normocephalic and atraumatic.  Eyes: Conjunctivae and EOM are normal.  Neck: Normal range of motion.  Cardiovascular: Normal rate, regular rhythm, normal heart sounds and intact distal pulses.   Pulmonary/Chest: Effort normal and breath sounds normal.  Abdominal: Soft. Bowel sounds are normal.  Lymphadenopathy:    She has no cervical adenopathy.  Neurological: She is alert and oriented to person, place, and time.  Skin: Skin is warm and dry. Capillary refill takes less than 2 seconds.  Psychiatric: She has a normal mood and affect. Her behavior is normal.  Nursing note and vitals reviewed.    Musculoskeletal Exam: C-spine and thoracic lumbar spine good range of motion. Shoulder joints elbow joints wrist joints with good range of motion. MCPs PIPs DIPs with good range of motion. No synovitis was noted. Hip joints knee joints ankles MTPs PIPs DIPs with good range of motion. No synovitis was noted. She had tenderness on palpation over bilateral wrist joints, bilateral knee joints, bilateral feet in the midfoot region. She also had generalized hyperalgesia and positive tender points.  CDAI Exam: CDAI Homunculus Exam:   Joint Counts:  CDAI Tender Joint count: 0 CDAI Swollen Joint count: 0  Global Assessments:  Patient Global Assessment: 8 Provider Global Assessment: 2  CDAI Calculated Score: 10    Investigation: No additional findings.PLQ eye exam? CBC Latest Ref Rng & Units 03/26/2017 11/28/2016 10/02/2016  WBC 3.8 - 10.8 K/uL 3.7(L) 6.7 4.6  Hemoglobin 11.7 - 15.5 g/dL 11.8 12.6 13.4  Hematocrit 35.0 - 45.0 % 36.2 38.1 40.0  Platelets 140 - 400 K/uL 261 268 266   CMP Latest Ref Rng & Units 03/26/2017 11/28/2016 10/02/2016  Glucose 65 - 99 mg/dL 100(H) 88 95  BUN 7 - 25 mg/dL 11 8 8   Creatinine 0.50 - 1.10 mg/dL 0.68 0.71 0.60  Sodium 135 - 146 mmol/L 138 136  140  Potassium 3.5 - 5.3 mmol/L 4.2 4.6 4.4  Chloride 98 - 110 mmol/L 103 103 100  CO2 20 - 31 mmol/L 24 28 26   Calcium 8.6 - 10.2 mg/dL 8.9 8.8 9.3  Total Protein 6.1 - 8.1 g/dL 6.6 6.7 7.1  Total Bilirubin 0.2 - 1.2 mg/dL 0.4 0.3 0.4  Alkaline Phos 33 - 115 U/L 55 51 64  AST 10 - 35 U/L 15 14 21   ALT 6 - 29 U/L 13 11 17     Imaging: Xr Foot 2 Views Left  Result Date: 05/29/2017 PIP and DIP joint space narrowing was noted. Erosive changes were noted in the fourth and fifth MTP joints which are unchanged from her previous x-rays done in February 2017. No intertarsal joint space narrowing was noted. Inferior and posterior calcaneal spurs were noted. Impression: These findings are consistent with rheumatoid arthritis with no interval progression from February 2017.  Xr Foot 2 Views Right  Result Date: 05/29/2017 PIP/DIP narrowing was noted. Right first MTP narrowing was noted. Fifth MTP erosive changes were noted. No intertarsal joint space narrowing was noted. Inferior and posterior calcaneal spurs were noted. There was no interval change  from her previous x-rays of February 2017. Impression: These findings were consistent with rheumatoid arthritis.  Xr Hand 2 View Left  Result Date: 05/29/2017 No MCP PIP/DIP or intercarpal joint space narrowing was noted. No erosive changes were noted. Mild juxta-articular osteopenia was noted.  Xr Hand 2 View Right  Result Date: 05/29/2017 No MCP PIP/DIP or intercarpal joint space narrowing was noted. No erosive changes were noted. Mild juxta-articular osteopenia was noted.    Speciality Comments: No specialty comments available.    Procedures:  No procedures performed Allergies: Aspirin and Augmentin [amoxicillin-pot clavulanate]   Assessment / Plan:     Visit Diagnoses: Rheumatoid arthritis with positive rheumatoid factor, involving unspecified site (HCC) - +RF, +CCP -patient complains of increased pain and increased joint swelling. I did not  notice any synovitis on examination. I will check following labs today. Plan: Sedimentation rate, Cyclic citrul peptide antibody, IgG. I also plan to schedule ultrasound of her bilateral hands to look for synovitis.  High risk medication use - MTX 0.8 ML subcutaneous every week, folic acid 2 mg by mouth daily, PLQ 200 mg by mouth twice a day Monday through Friday, PLQ eye exam May 2018 per patient - Plan: CBC with Differential/Platelet, COMPLETE METABOLIC PANEL WITH GFR today and every 3 months to monitor for drug toxicity  Pain in both hands - Plan: XR Hand 2 View Right, XR Hand 2 View Left. Her x-rays today did not show any progression from her previous x-rays of February 2017  Pain in both feet - Plan: XR Foot 2 Views Right, XR Foot 2 Views Left. She has some erosive changes in her bilateral fifth MTP joint but no change from her previous x-rays of February 2017.  History of depression  Fibromyalgia: Based on generalized pain, myalgias, positive tender points she meets the criteria for fibromyalgia syndrome. Detailed counseling regarding fibromyalgia syndrome was provided. Need for regular exercise and dietary management was discussed.  History of asthma  History of gastroesophageal reflux (GERD)    Orders: Orders Placed This Encounter  Procedures  . XR Hand 2 View Right  . XR Hand 2 View Left  . XR Foot 2 Views Right  . XR Foot 2 Views Left  . CBC with Differential/Platelet  . COMPLETE METABOLIC PANEL WITH GFR  . Sedimentation rate  . Cyclic citrul peptide antibody, IgG   No orders of the defined types were placed in this encounter.   Face-to-face time spent with patient was 30 minutes. Greater than 50% of time was spent in counseling and coordination of care.  Follow-Up Instructions: Return in about 5 months (around 10/29/2017) for Rheumatoid arthritis.   Bo Merino, MD  Note - This record has been created using Editor, commissioning.  Chart creation errors have been  sought, but may not always  have been located. Such creation errors do not reflect on  the standard of medical care.

## 2017-05-29 ENCOUNTER — Encounter: Payer: Self-pay | Admitting: Rheumatology

## 2017-05-29 ENCOUNTER — Ambulatory Visit (INDEPENDENT_AMBULATORY_CARE_PROVIDER_SITE_OTHER): Payer: 59

## 2017-05-29 ENCOUNTER — Ambulatory Visit (INDEPENDENT_AMBULATORY_CARE_PROVIDER_SITE_OTHER): Payer: 59 | Admitting: Rheumatology

## 2017-05-29 VITALS — Ht 61.0 in | Wt 194.0 lb

## 2017-05-29 DIAGNOSIS — Z8659 Personal history of other mental and behavioral disorders: Secondary | ICD-10-CM | POA: Diagnosis not present

## 2017-05-29 DIAGNOSIS — M79671 Pain in right foot: Secondary | ICD-10-CM

## 2017-05-29 DIAGNOSIS — M79641 Pain in right hand: Secondary | ICD-10-CM

## 2017-05-29 DIAGNOSIS — Z8719 Personal history of other diseases of the digestive system: Secondary | ICD-10-CM | POA: Diagnosis not present

## 2017-05-29 DIAGNOSIS — Z8709 Personal history of other diseases of the respiratory system: Secondary | ICD-10-CM | POA: Diagnosis not present

## 2017-05-29 DIAGNOSIS — M79642 Pain in left hand: Secondary | ICD-10-CM

## 2017-05-29 DIAGNOSIS — M059 Rheumatoid arthritis with rheumatoid factor, unspecified: Secondary | ICD-10-CM | POA: Diagnosis not present

## 2017-05-29 DIAGNOSIS — Z79899 Other long term (current) drug therapy: Secondary | ICD-10-CM

## 2017-05-29 DIAGNOSIS — M79672 Pain in left foot: Secondary | ICD-10-CM | POA: Diagnosis not present

## 2017-05-29 DIAGNOSIS — M797 Fibromyalgia: Secondary | ICD-10-CM

## 2017-05-29 NOTE — Patient Instructions (Signed)
Standing Labs We placed an order today for your standing lab work.    Please come back and get your standing labs in December and every 3 months  We have open lab Monday through Friday from 8:30-11:30 AM and 1:30-4 PM at the office of Dr. Ngan Qualls.   The office is located at [address], [address], [place], [place] [address] No appointment is necessary.   Labs are drawn by [facility].  You may receive a bill from [facility] for your lab work. If you have any questions regarding directions or hours of operation,  please call [phone].    

## 2017-05-30 LAB — CBC WITH DIFFERENTIAL/PLATELET
Basophils Absolute: 61 cells/uL (ref 0–200)
Basophils Relative: 1.2 %
Eosinophils Absolute: 179 cells/uL (ref 15–500)
Eosinophils Relative: 3.5 %
HCT: 36.3 % (ref 35.0–45.0)
Hemoglobin: 12.3 g/dL (ref 11.7–15.5)
Lymphs Abs: 1601 cells/uL (ref 850–3900)
MCH: 29 pg (ref 27.0–33.0)
MCHC: 33.9 g/dL (ref 32.0–36.0)
MCV: 85.6 fL (ref 80.0–100.0)
MPV: 11.2 fL (ref 7.5–12.5)
Monocytes Relative: 9.6 %
Neutro Abs: 2769 cells/uL (ref 1500–7800)
Neutrophils Relative %: 54.3 %
Platelets: 228 10*3/uL (ref 140–400)
RBC: 4.24 10*6/uL (ref 3.80–5.10)
RDW: 12 % (ref 11.0–15.0)
Total Lymphocyte: 31.4 %
WBC mixed population: 490 cells/uL (ref 200–950)
WBC: 5.1 10*3/uL (ref 3.8–10.8)

## 2017-05-30 LAB — COMPLETE METABOLIC PANEL WITH GFR
AG Ratio: 1.3 (calc) (ref 1.0–2.5)
ALT: 15 U/L (ref 6–29)
AST: 19 U/L (ref 10–35)
Albumin: 4 g/dL (ref 3.6–5.1)
Alkaline phosphatase (APISO): 61 U/L (ref 33–115)
BUN: 7 mg/dL (ref 7–25)
CO2: 30 mmol/L (ref 20–32)
Calcium: 8.9 mg/dL (ref 8.6–10.2)
Chloride: 102 mmol/L (ref 98–110)
Creat: 0.64 mg/dL (ref 0.50–1.10)
GFR, Est African American: 122 mL/min/{1.73_m2} (ref >=60)
GFR, Est Non African American: 106 mL/min/{1.73_m2} (ref >=60)
Globulin: 3.1 g/dL (calc) (ref 1.9–3.7)
Glucose, Bld: 90 mg/dL (ref 65–99)
Potassium: 4.9 mmol/L (ref 3.5–5.3)
Sodium: 138 mmol/L (ref 135–146)
Total Bilirubin: 0.2 mg/dL (ref 0.2–1.2)
Total Protein: 7.1 g/dL (ref 6.1–8.1)

## 2017-05-30 LAB — SEDIMENTATION RATE: Sed Rate: 9 mm/h (ref 0–20)

## 2017-05-30 LAB — CYCLIC CITRUL PEPTIDE ANTIBODY, IGG: Cyclic Citrullin Peptide Ab: >250 UNITS — ABNORMAL HIGH

## 2017-05-30 NOTE — Progress Notes (Signed)
ESR is normal. It does not indicate RA flare.

## 2017-06-04 ENCOUNTER — Ambulatory Visit: Payer: 59

## 2017-06-07 ENCOUNTER — Ambulatory Visit (INDEPENDENT_AMBULATORY_CARE_PROVIDER_SITE_OTHER): Payer: 59

## 2017-06-07 DIAGNOSIS — J4540 Moderate persistent asthma, uncomplicated: Secondary | ICD-10-CM

## 2017-06-27 ENCOUNTER — Other Ambulatory Visit: Payer: Self-pay | Admitting: Allergy and Immunology

## 2017-07-02 NOTE — Progress Notes (Signed)
Patient has known history of sero positive rheumatoid arthritis. She is on methotrexate and Plaquenil. She is a scheduled to have ultrasound of bilateral hands to rule out synovitis.

## 2017-07-03 ENCOUNTER — Ambulatory Visit (INDEPENDENT_AMBULATORY_CARE_PROVIDER_SITE_OTHER): Payer: 59 | Admitting: Rheumatology

## 2017-07-03 ENCOUNTER — Inpatient Hospital Stay (INDEPENDENT_AMBULATORY_CARE_PROVIDER_SITE_OTHER): Payer: Self-pay

## 2017-07-03 DIAGNOSIS — M79641 Pain in right hand: Secondary | ICD-10-CM

## 2017-07-03 DIAGNOSIS — M79642 Pain in left hand: Secondary | ICD-10-CM | POA: Diagnosis not present

## 2017-07-04 ENCOUNTER — Ambulatory Visit: Payer: Self-pay

## 2017-07-11 ENCOUNTER — Ambulatory Visit (INDEPENDENT_AMBULATORY_CARE_PROVIDER_SITE_OTHER): Payer: 59 | Admitting: *Deleted

## 2017-07-11 DIAGNOSIS — J4540 Moderate persistent asthma, uncomplicated: Secondary | ICD-10-CM | POA: Diagnosis not present

## 2017-07-26 ENCOUNTER — Telehealth: Payer: Self-pay | Admitting: Rheumatology

## 2017-07-26 MED ORDER — PREDNISONE 5 MG PO TABS: ORAL_TABLET | ORAL | 0 refills | Status: DC

## 2017-07-26 NOTE — Telephone Encounter (Signed)
Ok to give Pred taper as rxd in July

## 2017-07-26 NOTE — Telephone Encounter (Signed)
Patient states she has been off her MTX x 2 weeks due a sinus infection and being on antibiotics. Patient states she has cleared the sinus infection. Patient states she has now having pain in shoulders, wrist, hands and feet. Patient states she has noticed swelling in her fingers and hands. Patient states she is having trouble combing her hair and putting on her clothes the last two days. Patient is taking PLQ and MTX as prescribed.  Patient requesting a prednisone.

## 2017-07-26 NOTE — Telephone Encounter (Signed)
Patient advised prescription for prednisone sent to the pharmacy.  

## 2017-07-26 NOTE — Telephone Encounter (Signed)
Patient has been off MTX x2 weeks now due to a sinus infection. Patient better from that, but now having a flare. Pain with shoulders, wrists, hands, and feet. Patient cannot comb hair, or put on clothes. [facility] [address]. Please call patient to advise.

## 2017-08-11 ENCOUNTER — Other Ambulatory Visit: Payer: Self-pay | Admitting: Allergy and Immunology

## 2017-08-15 ENCOUNTER — Ambulatory Visit (INDEPENDENT_AMBULATORY_CARE_PROVIDER_SITE_OTHER): Payer: 59 | Admitting: *Deleted

## 2017-08-15 DIAGNOSIS — J4540 Moderate persistent asthma, uncomplicated: Secondary | ICD-10-CM | POA: Diagnosis not present

## 2017-08-19 ENCOUNTER — Telehealth: Payer: Self-pay | Admitting: Allergy & Immunology

## 2017-08-19 ENCOUNTER — Other Ambulatory Visit: Payer: Self-pay

## 2017-08-19 MED ORDER — DEXLANSOPRAZOLE 60 MG PO CPDR: DELAYED_RELEASE_CAPSULE | ORAL | 0 refills | Status: DC

## 2017-08-19 NOTE — Telephone Encounter (Signed)
Pt called and  needs to have [place] called in and made appointment for Thursday dec.27 with [place]. [facility].

## 2017-08-19 NOTE — Telephone Encounter (Signed)
Called and informed patient that her refill has been sent in.

## 2017-08-29 ENCOUNTER — Other Ambulatory Visit: Payer: Self-pay | Admitting: Rheumatology

## 2017-08-30 ENCOUNTER — Other Ambulatory Visit: Payer: Self-pay | Admitting: Allergy and Immunology

## 2017-08-30 NOTE — Telephone Encounter (Signed)
Last Visit: 05/29/17 Next visit: 11/26/17 Labs: 05/29/17 cbc/cmp WNL PLQ eye exam May 2018 WNL  Okay to refill per Dr. Estanislado Pandy

## 2017-09-12 ENCOUNTER — Ambulatory Visit: Payer: 59

## 2017-09-12 ENCOUNTER — Ambulatory Visit (INDEPENDENT_AMBULATORY_CARE_PROVIDER_SITE_OTHER): Payer: 59 | Admitting: Allergy & Immunology

## 2017-09-12 ENCOUNTER — Encounter: Payer: Self-pay | Admitting: Allergy & Immunology

## 2017-09-12 VITALS — BP 116/70 | HR 103 | Ht 61.25 in | Wt 193.0 lb

## 2017-09-12 DIAGNOSIS — J4540 Moderate persistent asthma, uncomplicated: Secondary | ICD-10-CM

## 2017-09-12 DIAGNOSIS — J302 Other seasonal allergic rhinitis: Secondary | ICD-10-CM | POA: Diagnosis not present

## 2017-09-12 DIAGNOSIS — K219 Gastro-esophageal reflux disease without esophagitis: Secondary | ICD-10-CM | POA: Diagnosis not present

## 2017-09-12 NOTE — Progress Notes (Signed)
FOLLOW UP  Date of Service/Encounter:  09/12/17   Assessment:   Moderate persistent asthma without complication  LPRD (laryngopharyngeal reflux disease) - on Dexilant   Seasonal allergic rhinitis  Rheumatoid arthritis - stable on Plaquenil + MTX + folic acid   Asthma Reportables:  Severity: moderate persistent  Risk: high Control: well controlled   Plan/Recommendations:   1. Moderate persistent asthma without complication - Lung testing actually looks quite good today, so we can defer additional steroids at this time. - Stop the Qvar and use Symbicort two puffs twice daily with a spacer (this contains a long-acting albuterol with an inhaled steroids). - Hopefully this addition of the long-acting albuterol will help you to recuperate during this current illness. - Daily controller medication(s): Symbicort 80/4.61mcg two puffs twice daily with spacer until the sample is gone (then switch back to Qvar 71mcg two puffs twice daily) + Xolair 300mg  monthly - Prior to physical activity: ProAir 2 puffs 10-15 minutes before physical activity - Rescue medications: ProAir 4 puffs every 4-6 hours as needed or albuterol nebulizer one vial puffs every 4-6 hours as needed - Asthma control goals:  * Full participation in all desired activities (may need albuterol before activity) * Albuterol use two time or less a week on average (not counting use with activity) * Cough interfering with sleep two time or less a month * Oral steroids no more than once a year * No hospitalizations  2. LPRD (laryngopharyngeal reflux disease) - Continue with Dexilant 60mg  daily.  3. Seasonal allergic rhinitis - Continue with levocetirizine (Xyzal) 5mg  daily.  4. Return in about 6 months (around 03/13/2018).  Subjective:   Julie [name] is a 51 y.o. female presenting today for follow up of  Chief Complaint  Patient presents with  . Breathing Problem    Pt presents for follow up of asthma. Pt had a  recent flare about 3 weeks ago. Pt saw PCP yesterday and is following up.    Julie [name] has a history of the following: Patient Active Problem List   Diagnosis Date Noted  . Rheumatoid arthritis involving multiple sites with positive rheumatoid factor ([place]) 11/05/2016  . High risk medication use 11/05/2016  . LPRD (laryngopharyngeal reflux disease) 09/20/2015  . Mild persistent asthma 09/20/2015  . Allergic rhinoconjunctivitis 09/20/2015  . Acute sinusitis 07/29/2015  . Asthma with acute exacerbation 07/29/2015  . S/P total hysterectomy 05/25/2015    History obtained from: chart review and patient.  Julie [name]'s Primary Care Provider is Starkes, Gayland Curry, FNP.     Julie is a 51 y.o. female presenting for a follow up visit. She is a patient of Dr. Bruna Potter and was last seen by my in May 2018. She has a history of chronic rhinosinusitis and has required multiple antibiotics since the last visit. At that time, she was complaining of "inflamed" sinuses. We recommended that she see Dr. Constance Holster for a sinus culture. He saw her very quickly and she was completely clear without any discharge. We continued her on Dexilant 60mg  daily for her reflux. She is on Xolair monthly for her asthma.   Since the last visit, she has not done well. Over the last three weeks, she has developed shortness of breath with chest tightness. She was started on doxycyline (she still has a couple of days left of this) and was given a steroid injection yesterday. She did feel slightly better since that was given. She did sleep better last night. She is  on her Qvar 18mcg two puffs twice daily. She has been using her ProAir throughout the day. She does report sinus drainage and discharge, which has become slightly better with the doxycyline. It really seems to have improved since getting the steroid shot yesterday. She is not using Mucinex and she has not been doing salt water rinses as recommended.   Aside from the  last three weeks, she has mostly done well. The weather change seemed to have made things worse. Prior to this current illness, she has actually done rather well. Surprisingly, she does well in the warmer weather.   She has a history of RA and has been on the Plaquenil and MTX with folic acid for 14 years. Recent workup for nodules was excellent. She was recently placed on prednisone burst around 6 weeks ago for a flare. She was also diagnosed tentatively with fibromyalgia. Her PCP does not want her to get a flu shot due to current flaring of her RA. She has never been on an injectable biologic for her RA.   Otherwise, there have been no changes to her past medical history, surgical history, family history, or social history. She continues to work in the [employer], which does provide some stress due to interactions with coworkers.     Review of Systems: a 14-point review of systems is pertinent for what is mentioned in HPI.  Otherwise, all other systems were negative. Constitutional: negative other than that listed in the HPI Eyes: negative other than that listed in the HPI Ears, nose, mouth, throat, and face: negative other than that listed in the HPI Respiratory: negative other than that listed in the HPI Cardiovascular: negative other than that listed in the HPI Gastrointestinal: negative other than that listed in the HPI Genitourinary: negative other than that listed in the HPI Integument: negative other than that listed in the HPI Hematologic: negative other than that listed in the HPI Musculoskeletal: negative other than that listed in the HPI Neurological: negative other than that listed in the HPI Allergy/Immunologic: negative other than that listed in the HPI    Objective:   Blood pressure 116/70, pulse (!) 103, height 5' 1.25" (1.556 m), weight 193 lb (87.5 kg), last menstrual period 05/19/2015, SpO2 96 %. Body mass index is 36.17  kg/m.   Physical Exam:  General: Alert, interactive, in no acute distress. Pleasant female.  Eyes: No conjunctival injection bilaterally, no discharge on the right, no discharge on the left and no Horner-Trantas dots present. PERRL bilaterally. EOMI without pain. No photophobia.  Ears: Right TM pearly gray with normal light reflex, Left TM pearly gray with normal light reflex, Right TM intact without perforation and Left TM intact without perforation.  Nose/Throat: External nose within normal limits, nasal crease present and septum midline. Turbinates edematous and pale without discharge. Posterior oropharynx mildly erythematous without cobblestoning in the posterior oropharynx. Tonsils 2+ without exudates.  Tongue without thrush. Adenopathy: no enlarged lymph nodes appreciated in the anterior cervical, occipital, axillary, epitrochlear, inguinal, or popliteal regions. Lungs: Clear to auscultation without wheezing, rhonchi or rales. No increased work of breathing. CV: Normal S1/S2. No murmurs. Capillary refill <2 seconds.  Skin: Warm and dry, without lesions or rashes. Neuro:   Grossly intact. No focal deficits appreciated. Responsive to questions.  Diagnostic studies:   Spirometry: results normal (FEV1: 1.79/86%, FVC: 2.19/85%, FEV1/FVC: 82%).    Spirometry consistent with normal pattern. Overall they are improved compared to his spiro findings from  the last visit.   Allergy Studies: none     Salvatore Marvel, MD [facility]

## 2017-09-12 NOTE — Patient Instructions (Addendum)
1. Moderate persistent asthma without complication - Lung testing actually looks quite good today, so we can defer additional steroids at this time. - Stop the Qvar and use Symbicort two puffs twice daily with a spacer (this contains a long-acting albuterol with an inhaled steroids). - Hopefully this addition of the long-acting albuterol will help you to recuperate during this current illness. - Daily controller medication(s): Symbicort 80/4.58mcg two puffs twice daily with spacer until the sample is gone (then switch back to Qvar 68mcg two puffs twice daily) + Xolair 300mg  monthly - Prior to physical activity: ProAir 2 puffs 10-15 minutes before physical activity - Rescue medications: ProAir 4 puffs every 4-6 hours as needed or albuterol nebulizer one vial puffs every 4-6 hours as needed - Asthma control goals:  * Full participation in all desired activities (may need albuterol before activity) * Albuterol use two time or less a week on average (not counting use with activity) * Cough interfering with sleep two time or less a month * Oral steroids no more than once a year * No hospitalizations  2. LPRD (laryngopharyngeal reflux disease) - Continue with Dexilant 60mg  daily.  3. Other seasonal allergic rhinitis - Continue with levocetirizine (Xyzal) 5mg  daily.  4. Return in about 6 months (around 03/13/2018).   Please inform us of any Emergency Department visits, hospitalizations, or changes in symptoms. Call us before going to the ED for breathing or allergy symptoms since we might be able to fit you in for a sick visit. Feel free to contact us anytime with any questions, problems, or concerns.  It was a pleasure to see you again today! Enjoy the holiday season!  Websites that have reliable patient information: 1. American Academy of Asthma, Allergy, and Immunology: www.aaaai.org 2. Food Allergy Research and Education (FARE): foodallergy.org 3. Mothers of Asthmatics:  http://www.asthmacommunitynetwork.org 4. American College of Allergy, Asthma, and Immunology: www.acaai.org

## 2017-09-13 ENCOUNTER — Encounter: Payer: Self-pay | Admitting: Allergy & Immunology

## 2017-10-02 ENCOUNTER — Other Ambulatory Visit: Payer: Self-pay | Admitting: Allergy & Immunology

## 2017-10-10 ENCOUNTER — Ambulatory Visit: Payer: 59

## 2017-10-17 ENCOUNTER — Ambulatory Visit (INDEPENDENT_AMBULATORY_CARE_PROVIDER_SITE_OTHER): Payer: 59 | Admitting: *Deleted

## 2017-10-17 DIAGNOSIS — J4540 Moderate persistent asthma, uncomplicated: Secondary | ICD-10-CM

## 2017-10-29 ENCOUNTER — Other Ambulatory Visit: Payer: Self-pay | Admitting: Allergy & Immunology

## 2017-10-31 ENCOUNTER — Ambulatory Visit: Payer: 59 | Admitting: Rheumatology

## 2017-11-12 NOTE — Progress Notes (Deleted)
Office Visit Note  Patient: Julie [name]             Date of Birth: Jun 28, 1966           MRN: [id number]             PCP: [name], FNP Referring: [name], FNP Visit Date: 11/26/2017 Occupation: @GUAROCC @    Subjective:  No chief complaint on file.   History of Present Illness: Julie [name] is a 52 y.o. female ***   Activities of Daily Living:  Patient reports morning stiffness for *** {minute/hour:19697}.   Patient {ACTIONS;DENIES/REPORTS:21021675::"Denies"} nocturnal pain.  Difficulty dressing/grooming: {ACTIONS;DENIES/REPORTS:21021675::"Denies"} Difficulty climbing stairs: {ACTIONS;DENIES/REPORTS:21021675::"Denies"} Difficulty getting out of chair: {ACTIONS;DENIES/REPORTS:21021675::"Denies"} Difficulty using hands for taps, buttons, cutlery, and/or writing: {ACTIONS;DENIES/REPORTS:21021675::"Denies"}   No Rheumatology ROS completed.   PMFS History:  Patient Active Problem List   Diagnosis Date Noted  . Rheumatoid arthritis involving multiple sites with positive rheumatoid factor ([place]) 11/05/2016  . High risk medication use 11/05/2016  . LPRD (laryngopharyngeal reflux disease) 09/20/2015  . Mild persistent asthma 09/20/2015  . Allergic rhinoconjunctivitis 09/20/2015  . Acute sinusitis 07/29/2015  . Asthma with acute exacerbation 07/29/2015  . S/P total hysterectomy 05/25/2015    Past Medical History:  Diagnosis Date  . Asthma   . Complication of anesthesia   . Eczema   . PONV (postoperative nausea and vomiting)   . Rheumatoid arthritis ([place])     Family History  Problem Relation Age of Onset  . Asthma Mother   . Eczema Mother   . Allergic rhinitis Neg Hx   . Angioedema Neg Hx   . Immunodeficiency Neg Hx   . Urticaria Neg Hx    Past Surgical History:  Procedure Laterality Date  . ABDOMINAL HYSTERECTOMY     partial age 37  . BILATERAL SALPINGECTOMY Bilateral 05/25/2015   Procedure: BILATERAL SALPINGECTOMY;  Surgeon: Servando Salina, MD;  Location: [facility];  Service: Gynecology;  Laterality: Bilateral;  . OVARIAN CYST REMOVAL Right 05/25/2015   Procedure: OVARIAN CYSTECTOMY;  Surgeon: Servando Salina, MD;  Location: [facility];  Service: Gynecology;  Laterality: Right;  . TONSILLECTOMY    . TUBAL LIGATION     Social History   Social History Narrative  . Not on file     Objective: Vital Signs: LMP 05/19/2015    Physical Exam   Musculoskeletal Exam: ***  CDAI Exam: No CDAI exam completed.    Investigation: No additional findings. CBC Latest Ref Rng & Units 05/29/2017 03/26/2017 11/28/2016  WBC 3.8 - 10.8 Thousand/uL 5.1 3.7(L) 6.7  Hemoglobin 11.7 - 15.5 g/dL 12.3 11.8 12.6  Hematocrit 35.0 - 45.0 % 36.3 36.2 38.1  Platelets 140 - 400 Thousand/uL 228 261 268   CMP Latest Ref Rng & Units 05/29/2017 03/26/2017 11/28/2016  Glucose 65 - 99 mg/dL 90 100(H) 88  BUN 7 - 25 mg/dL 7 11 8   Creatinine 0.50 - 1.10 mg/dL 0.64 0.68 0.71  Sodium 135 - 146 mmol/L 138 138 136  Potassium 3.5 - 5.3 mmol/L 4.9 4.2 4.6  Chloride 98 - 110 mmol/L 102 103 103  CO2 20 - 32 mmol/L 30 24 28   Calcium 8.6 - 10.2 mg/dL 8.9 8.9 8.8  Total Protein 6.1 - 8.1 g/dL 7.1 6.6 6.7  Total Bilirubin 0.2 - 1.2 mg/dL 0.2 0.4 0.3  Alkaline Phos 33 - 115 U/L - 55 51  AST 10 - 35 U/L 19 15 14   ALT 6 - 29 U/L 15 13 11  Imaging: No results found.  Speciality Comments: No specialty comments available.    Procedures:  No procedures performed Allergies: Aspirin and Augmentin [amoxicillin-pot clavulanate]   Assessment / Plan:     Visit Diagnoses: No diagnosis found.    Orders: No orders of the defined types were placed in this encounter.  No orders of the defined types were placed in this encounter.   Face-to-face time spent with patient was *** minutes. 50% of time was spent in counseling and coordination of care.  Follow-Up Instructions: No Follow-up on file.   Ofilia Neas, PA-C  Note - This record has been created  using Dragon software.  Chart creation errors have been sought, but may not always  have been located. Such creation errors do not reflect on  the standard of medical care.

## 2017-11-14 ENCOUNTER — Encounter: Payer: Self-pay | Admitting: Rheumatology

## 2017-11-14 ENCOUNTER — Ambulatory Visit: Payer: 59 | Admitting: Rheumatology

## 2017-11-14 ENCOUNTER — Ambulatory Visit (INDEPENDENT_AMBULATORY_CARE_PROVIDER_SITE_OTHER): Payer: 59 | Admitting: *Deleted

## 2017-11-14 VITALS — BP 137/91 | HR 92 | Resp 15 | Ht 61.25 in | Wt 193.0 lb

## 2017-11-14 DIAGNOSIS — Z79899 Other long term (current) drug therapy: Secondary | ICD-10-CM

## 2017-11-14 DIAGNOSIS — Z8719 Personal history of other diseases of the digestive system: Secondary | ICD-10-CM | POA: Diagnosis not present

## 2017-11-14 DIAGNOSIS — Z8709 Personal history of other diseases of the respiratory system: Secondary | ICD-10-CM

## 2017-11-14 DIAGNOSIS — J4540 Moderate persistent asthma, uncomplicated: Secondary | ICD-10-CM | POA: Diagnosis not present

## 2017-11-14 DIAGNOSIS — M797 Fibromyalgia: Secondary | ICD-10-CM | POA: Diagnosis not present

## 2017-11-14 DIAGNOSIS — M0579 Rheumatoid arthritis with rheumatoid factor of multiple sites without organ or systems involvement: Secondary | ICD-10-CM

## 2017-11-14 DIAGNOSIS — Z8659 Personal history of other mental and behavioral disorders: Secondary | ICD-10-CM | POA: Diagnosis not present

## 2017-11-14 DIAGNOSIS — M65331 Trigger finger, right middle finger: Secondary | ICD-10-CM | POA: Diagnosis not present

## 2017-11-14 MED ORDER — DICLOFENAC SODIUM 1 % TD GEL: TRANSDERMAL | 0 refills | Status: DC

## 2017-11-14 NOTE — Progress Notes (Signed)
Office Visit Note  Patient: Julie [name]             Date of Birth: [birth year]           MRN: [id number]             PCP: [name], FNP Referring: [name], FNP Visit Date: 11/14/2017 Occupation: @GUAROCC @    Subjective:  Pain in hands.   History of Present Illness: Julie [name] is a 52 y.o. female G of seropositive rheumatoid arthritis.  She states she had been doing well until recently.  She states with the weather change she has been having increased discomfort.  She has difficulty bending her right third finger.  She has been also having some discomfort in her bilateral feet.  Has noticed some joint swelling in her hands.  Activities of Daily Living:  Patient reports morning stiffness for all day  hours.   Patient Reports nocturnal pain.  Difficulty dressing/grooming: Denies Difficulty climbing stairs: Denies Difficulty getting out of chair: Denies Difficulty using hands for taps, buttons, cutlery, and/or writing: Reports   Review of Systems  Constitutional: Positive for fatigue. Negative for night sweats, weight gain, weight loss and weakness.  HENT: Positive for mouth dryness. Negative for mouth sores, trouble swallowing, trouble swallowing and nose dryness.   Eyes: Positive for dryness. Negative for pain, redness and visual disturbance.  Respiratory: Negative for cough, shortness of breath and difficulty breathing.   Cardiovascular: Positive for hypertension. Negative for chest pain, palpitations, irregular heartbeat and swelling in legs/feet.  Gastrointestinal: Negative for blood in stool, constipation and diarrhea.  Endocrine: Negative for increased urination.  Genitourinary: Negative for vaginal dryness.  Musculoskeletal: Positive for arthralgias, joint pain, joint swelling and morning stiffness. Negative for myalgias, muscle weakness, muscle tenderness and myalgias.  Skin: Negative for color change, rash, hair loss, skin tightness, ulcers  and sensitivity to sunlight.  Allergic/Immunologic: Negative for susceptible to infections.  Neurological: Negative for dizziness, memory loss and night sweats.  Hematological: Negative for swollen glands.  Psychiatric/Behavioral: Positive for sleep disturbance. Negative for depressed mood. The patient is not nervous/anxious.     PMFS History:  Patient Active Problem List   Diagnosis Date Noted  . Rheumatoid arthritis involving multiple sites with positive rheumatoid factor ([place]) 11/05/2016  . High risk medication use 11/05/2016  . LPRD (laryngopharyngeal reflux disease) 09/20/2015  . Mild persistent asthma 09/20/2015  . Allergic rhinoconjunctivitis 09/20/2015  . Acute sinusitis 07/29/2015  . Asthma with acute exacerbation 07/29/2015  . S/P total hysterectomy 05/25/2015    Past Medical History:  Diagnosis Date  . Asthma   . Complication of anesthesia   . Eczema   . PONV (postoperative nausea and vomiting)   . Rheumatoid arthritis ([place])     Family History  Problem Relation Age of Onset  . Asthma Mother   . Eczema Mother   . Sarcoidosis Mother   . Hypertension Mother   . Cancer Father   . Hypotension Father   . Asthma Sister   . GER disease Sister   . Allergic rhinitis Neg Hx   . Angioedema Neg Hx   . Immunodeficiency Neg Hx   . Urticaria Neg Hx    Past Surgical History:  Procedure Laterality Date  . ABDOMINAL HYSTERECTOMY     partial age 13  . BILATERAL SALPINGECTOMY Bilateral 05/25/2015   Procedure: BILATERAL SALPINGECTOMY;  Surgeon: Servando Salina, MD;  Location: [facility];  Service: Gynecology;  Laterality: Bilateral;  . OVARIAN  CYST REMOVAL Right 05/25/2015   Procedure: OVARIAN CYSTECTOMY;  Surgeon: Servando Salina, MD;  Location: [facility];  Service: Gynecology;  Laterality: Right;  . TONSILLECTOMY    . TUBAL LIGATION     Social History   Social History Narrative  . Not on file     Objective: Vital Signs: BP (!) 137/91 (BP Location: Left Arm, Patient  Position: Sitting, Cuff Size: Normal)   Pulse 92   Resp 15   Ht 5' 1.25" (1.556 m)   Wt 193 lb (87.5 kg)   LMP 05/19/2015   BMI 36.17 kg/m    Physical Exam  Constitutional: She is oriented to person, place, and time. She appears well-developed and well-nourished.  HENT:  Head: Normocephalic and atraumatic.  Eyes: Conjunctivae and EOM are normal.  Neck: Normal range of motion.  Cardiovascular: Normal rate, regular rhythm, normal heart sounds and intact distal pulses.  Pulmonary/Chest: Effort normal and breath sounds normal.  Abdominal: Soft. Bowel sounds are normal.  Lymphadenopathy:    She has no cervical adenopathy.  Neurological: She is alert and oriented to person, place, and time.  Skin: Skin is warm and dry. Capillary refill takes less than 2 seconds.  Psychiatric: She has a normal mood and affect. Her behavior is normal.  Nursing note and vitals reviewed.    Musculoskeletal Exam: C-spine thoracic lumbar spine good range of motion.  Shoulder joints elbow joints wrist joints MCPs PIPs DIPs with good range of motion.  She has incomplete extension of her right third finger consistent with trigger finger.  She has some tenderness over MCPs as described below.  No synovitis was noted.  Hip joints knee joints ankles MTPs PIPs with good range of motion.  She has generalized hyperalgesia from fibromyalgia.  CDAI Exam: CDAI Homunculus Exam:   Tenderness:  Right hand: 3rd MCP and 3rd PIP Left hand: 2nd PIP  Joint Counts:  CDAI Tender Joint count: 3 CDAI Swollen Joint count: 0  Global Assessments:  Patient Global Assessment: 7 Provider Global Assessment: 5  CDAI Calculated Score: 15    Investigation: No additional findings. CBC Latest Ref Rng & Units 05/29/2017 03/26/2017 11/28/2016  WBC 3.8 - 10.8 Thousand/uL 5.1 3.7(L) 6.7  Hemoglobin 11.7 - 15.5 g/dL 12.3 11.8 12.6  Hematocrit 35.0 - 45.0 % 36.3 36.2 38.1  Platelets 140 - 400 Thousand/uL 228 261 268   CMP Latest Ref  Rng & Units 05/29/2017 03/26/2017 11/28/2016  Glucose 65 - 99 mg/dL 90 100(H) 88  BUN 7 - 25 mg/dL 7 11 8   Creatinine 0.50 - 1.10 mg/dL 0.64 0.68 0.71  Sodium 135 - 146 mmol/L 138 138 136  Potassium 3.5 - 5.3 mmol/L 4.9 4.2 4.6  Chloride 98 - 110 mmol/L 102 103 103  CO2 20 - 32 mmol/L 30 24 28   Calcium 8.6 - 10.2 mg/dL 8.9 8.9 8.8  Total Protein 6.1 - 8.1 g/dL 7.1 6.6 6.7  Total Bilirubin 0.2 - 1.2 mg/dL 0.2 0.4 0.3  Alkaline Phos 33 - 115 U/L - 55 51  AST 10 - 35 U/L 19 15 14   ALT 6 - 29 U/L 15 13 11     Imaging: No results found.  Speciality Comments: No specialty comments available.    Procedures:  No procedures performed Allergies: Aspirin and Augmentin [amoxicillin-pot clavulanate]   Assessment / Plan:     Visit Diagnoses: Rheumatoid arthritis involving multiple sites with positive rheumatoid factor (HCC) - +RF, +CCP.  Patient gives history of intermittent swelling.  I do not  appreciate any synovitis today on examination.  She does have some tenderness.  I will schedule ultrasound of her bilateral hands to evaluate this further.  I will also obtain following labs today.- Plan: Sedimentation rate, Cyclic citrul peptide antibody, IgG  High risk medication use - MTX 0.8 ML subcutaneous every week, folic acid 2 mg by mouth daily, PLQ 200 mg by mouth twice a day Monday through Friday.PLQ eye exam May 2018 per patient.  She has been noncompliant with her labs.  I have advised her to get labs today and then every 3 months to monitor for drug toxicity.- Plan: CBC with Differential/Platelet, COMPLETE METABOLIC PANEL WITH GFR  Trigger middle finger of right hand: I have advised her to use Voltaren gel over the flexor tendon if it does not get better we will proceed with cortisone injection.  Fibromyalgia: She continues to have some generalized pain and discomfort.  Other medical problems are listed as follows:  History of gastroesophageal reflux (GERD)  History of depression  History  of asthma    Orders: Orders Placed This Encounter  Procedures  . CBC with Differential/Platelet  . COMPLETE METABOLIC PANEL WITH GFR  . Sedimentation rate  . Cyclic citrul peptide antibody, IgG   No orders of the defined types were placed in this encounter.   Face-to-face time spent with patient was 30 minutes.Greater than  50% of time was spent in counseling and coordination of care.  Follow-Up Instructions: Return in about 5 months (around 04/13/2018) for Rheumatoid arthritis.   Bo Merino, MD  Note - This record has been created using Editor, commissioning.  Chart creation errors have been sought, but may not always  have been located. Such creation errors do not reflect on  the standard of medical care.

## 2017-11-15 ENCOUNTER — Telehealth: Payer: Self-pay

## 2017-11-15 LAB — CBC WITH DIFFERENTIAL/PLATELET
Basophils Absolute: 41 cells/uL (ref 0–200)
Basophils Relative: 0.8 %
Eosinophils Absolute: 122 cells/uL (ref 15–500)
Eosinophils Relative: 2.4 %
HCT: 39.2 % (ref 35.0–45.0)
Hemoglobin: 13.3 g/dL (ref 11.7–15.5)
Lymphs Abs: 1678 cells/uL (ref 850–3900)
MCH: 27.9 pg (ref 27.0–33.0)
MCHC: 33.9 g/dL (ref 32.0–36.0)
MCV: 82.4 fL (ref 80.0–100.0)
MPV: 10.8 fL (ref 7.5–12.5)
Monocytes Relative: 6.7 %
Neutro Abs: 2917 cells/uL (ref 1500–7800)
Neutrophils Relative %: 57.2 %
Platelets: 238 10*3/uL (ref 140–400)
RBC: 4.76 10*6/uL (ref 3.80–5.10)
RDW: 11.8 % (ref 11.0–15.0)
Total Lymphocyte: 32.9 %
WBC mixed population: 342 cells/uL (ref 200–950)
WBC: 5.1 10*3/uL (ref 3.8–10.8)

## 2017-11-15 LAB — COMPLETE METABOLIC PANEL WITH GFR
AG Ratio: 1.2 (calc) (ref 1.0–2.5)
ALT: 12 U/L (ref 6–29)
AST: 17 U/L (ref 10–35)
Albumin: 4 g/dL (ref 3.6–5.1)
Alkaline phosphatase (APISO): 71 U/L (ref 33–115)
BUN: 11 mg/dL (ref 7–25)
CO2: 30 mmol/L (ref 20–32)
Calcium: 9.1 mg/dL (ref 8.6–10.2)
Chloride: 100 mmol/L (ref 98–110)
Creat: 0.67 mg/dL (ref 0.50–1.10)
GFR, Est African American: 120 mL/min/{1.73_m2} (ref >=60)
GFR, Est Non African American: 103 mL/min/{1.73_m2} (ref >=60)
Globulin: 3.4 g/dL (calc) (ref 1.9–3.7)
Glucose, Bld: 85 mg/dL (ref 65–99)
Potassium: 4.3 mmol/L (ref 3.5–5.3)
Sodium: 136 mmol/L (ref 135–146)
Total Bilirubin: 0.5 mg/dL (ref 0.2–1.2)
Total Protein: 7.4 g/dL (ref 6.1–8.1)

## 2017-11-15 LAB — CYCLIC CITRUL PEPTIDE ANTIBODY, IGG: Cyclic Citrullin Peptide Ab: >250 UNITS — ABNORMAL HIGH

## 2017-11-15 LAB — SEDIMENTATION RATE: Sed Rate: 28 mm/h — ABNORMAL HIGH (ref 0–20)

## 2017-11-15 NOTE — Progress Notes (Signed)
Labs indicate active disease. Patient was advised to sch [place] bilateral hands. Please, sch appt for [place].

## 2017-11-15 NOTE — Telephone Encounter (Signed)
After looking into her chart it looks like the last time we sent in montelukast was 07/16/2016. Patient was seen 08/2017 and it does not say to continue montelukast. Please advise.

## 2017-11-15 NOTE — Telephone Encounter (Signed)
Patient last seen in December 2018 by Dr Ernst Bowler. She went to fill her montelukast and was told there are no more refills on it. She is wondering is she supposed to continue taking montelukast. She doesn't remember if she was supposed to stop taking it or keep taking it. If so please send prescription to [facility]

## 2017-11-17 NOTE — Telephone Encounter (Signed)
That is odd. I would recommend stopping it and seeing if she has any rebound symptoms (either nasal congestion or asthma symptoms). If they return, we can add it back on. Otherwise, we can leave it on board. She should know in a week if she is getting any benefit from it.   Salvatore Marvel, MD [facility] [place] [facility] [place]

## 2017-11-18 ENCOUNTER — Other Ambulatory Visit: Payer: Self-pay | Admitting: Family

## 2017-11-18 DIAGNOSIS — Z1231 Encounter for screening mammogram for malignant neoplasm of breast: Secondary | ICD-10-CM

## 2017-11-18 NOTE — Telephone Encounter (Signed)
Called and spoke with patient and informed her of this recommendation. She understood.

## 2017-11-26 ENCOUNTER — Ambulatory Visit: Payer: 59 | Admitting: Rheumatology

## 2017-11-26 ENCOUNTER — Ambulatory Visit: Payer: 59 | Admitting: Physician Assistant

## 2017-11-27 ENCOUNTER — Ambulatory Visit (INDEPENDENT_AMBULATORY_CARE_PROVIDER_SITE_OTHER): Payer: 59 | Admitting: Rheumatology

## 2017-11-27 ENCOUNTER — Ambulatory Visit (INDEPENDENT_AMBULATORY_CARE_PROVIDER_SITE_OTHER): Payer: Self-pay

## 2017-11-27 DIAGNOSIS — M79642 Pain in left hand: Secondary | ICD-10-CM | POA: Diagnosis not present

## 2017-11-27 DIAGNOSIS — M79641 Pain in right hand: Secondary | ICD-10-CM | POA: Diagnosis not present

## 2017-11-27 NOTE — Progress Notes (Signed)
Ultrasound examination of bilateral hands was performed per EULAR recommendations. Using 12 MHz transducer, grayscale and power Doppler bilateral second, third, and fifth MCP joints and bilateral wrist joints both dorsal and volar aspects were evaluated to look for synovitis or tenosynovitis. The findings were there was no synovitis or tenosynovitis on ultrasound examination. Right median nerve was 0.08 cm squares which was normal limits and left median nerve was 0.08 cm squares bifid which was within normal limits.  Impression: Ultrasound examination did not show any synovitis or tenosynovitis.  Bilateral median nerves were within normal limits.

## 2017-11-30 ENCOUNTER — Other Ambulatory Visit: Payer: Self-pay

## 2017-11-30 ENCOUNTER — Encounter (HOSPITAL_COMMUNITY): Payer: Self-pay

## 2017-11-30 ENCOUNTER — Ambulatory Visit (INDEPENDENT_AMBULATORY_CARE_PROVIDER_SITE_OTHER): Payer: 59

## 2017-11-30 ENCOUNTER — Ambulatory Visit (HOSPITAL_COMMUNITY)
Admission: EM | Admit: 2017-11-30 | Discharge: 2017-11-30 | Disposition: A | Discharge status: Home / Self Care | Payer: 59 | Attending: Family Medicine | Admitting: Family Medicine

## 2017-11-30 DIAGNOSIS — S9031XA Contusion of right foot, initial encounter: Secondary | ICD-10-CM | POA: Diagnosis not present

## 2017-11-30 DIAGNOSIS — M79671 Pain in right foot: Secondary | ICD-10-CM | POA: Diagnosis not present

## 2017-11-30 MED ORDER — IBUPROFEN 800 MG PO TABS
ORAL_TABLET | ORAL | Status: AC
  Filled 2017-11-30: qty 1

## 2017-11-30 MED ORDER — IBUPROFEN 800 MG PO TABS
800.0000 mg | ORAL_TABLET | Freq: Once | ORAL | Status: AC
  Administered 2017-11-30: 800 mg via ORAL

## 2017-11-30 NOTE — ED Triage Notes (Signed)
Pt presents today with right foot pain that occurred on Tuesday when she was helping her daughter moved. States a big speaker fell on her right foot and she has been in pain ever since. Has not been able to sleep good or walk on her foot since. Tylenol does not help.

## 2017-12-09 NOTE — ED Provider Notes (Signed)
[place]   [id number] 11/30/17 Arrival Time: 23  ASSESSMENT & PLAN:  1. Contusion of right foot, initial encounter    Imaging: Dg Foot Complete Right  Result Date: 11/30/2017 CLINICAL DATA:  Recent crush injury to the fourth and fifth rays of the right foot, with pain EXAM: RIGHT FOOT COMPLETE - 3+ VIEW COMPARISON:  05/29/2017 right foot radiographs FINDINGS: No fracture or dislocation. No suspicious focal osseous lesions. Moderate Achilles and small plantar right calcaneal spurs are stable. No significant arthropathy. No radiopaque foreign bodies. IMPRESSION: No fracture or malalignment in the right foot. Electronically Signed   By: Ilona Sorrel M.D.   On: 11/30/2017 19:15   Meds ordered this encounter  Medications  . ibuprofen (ADVIL,MOTRIN) tablet 800 mg   Will follow up with PCP or here if worsening or failing to improve as anticipated. Reviewed expectations re: course of current medical issues. Questions answered. Outlined signs and symptoms indicating need for more acute intervention. Patient verbalized understanding. After Visit Summary given.  SUBJECTIVE: History from: patient. Julie [name] is a 52 y.o. female who reports persistent localized mild pain of her right foot that is stable; described as aching without radiation. Onset: gradual, a few days ago. Injury/trama: yes, reports a speaker fell onto her foot. Relieved by: not walking. Worsened by: certain movements and weight bearing Associated symptoms: none reported. Extremity sensation changes or weakness: none. Self treatment: tried OTCs without relief of pain. History of similar: no  ROS: As per HPI.   OBJECTIVE:  Vitals:   11/30/17 1737  BP: (!) 156/113  Pulse: 99  Resp: 20  Temp: 99.2 F (37.3 C)  TempSrc: Oral  SpO2: 99%    General appearance: alert; no distress Extremities: no cyanosis or edema; symmetrical with no gross deformities; poorly localized tenderness over her right  dorsal distal foot with no swelling and no bruising; ROM: normal CV: normal extremity capillary refill Skin: warm and dry Neurologic: normal gait; normal symmetric reflexes in all extremities; normal sensation in all extremities Psychological: alert and cooperative; normal mood and affect  Allergies  Allergen Reactions  . Aspirin Other (See Comments)    pt couldn't take while on methotrexate, but is no longer taking  . Augmentin [Amoxicillin-Pot Clavulanate] Other (See Comments)    G I upset    Past Medical History:  Diagnosis Date  . Asthma   . Complication of anesthesia   . Eczema   . PONV (postoperative nausea and vomiting)   . Rheumatoid arthritis ([place])    Social History   Socioeconomic History  . Marital status: Married    Spouse name: Not on file  . Number of children: Not on file  . Years of education: Not on file  . Highest education level: Not on file  Occupational History  . Not on file  Social Needs  . Financial resource strain: Not on file  . Food insecurity:    Worry: Not on file    Inability: Not on file  . Transportation needs:    Medical: Not on file    Non-medical: Not on file  Tobacco Use  . Smoking status: Never Smoker  . Smokeless tobacco: Never Used  Substance and Sexual Activity  . Alcohol use: No  . Drug use: No  . Sexual activity: Not on file  Lifestyle  . Physical activity:    Days per week: Not on file    Minutes per session: Not on file  . Stress: Not on file  Relationships  . Social connections:    Talks on phone: Not on file    Gets together: Not on file    Attends religious service: Not on file    Active member of club or organization: Not on file    Attends meetings of clubs or organizations: Not on file    Relationship status: Not on file  . Intimate partner violence:    Fear of current or ex partner: Not on file    Emotionally abused: Not on file    Physically abused: Not on file    Forced sexual activity: Not on file    Other Topics Concern  . Not on file  Social History Narrative  . Not on file   Family History  Problem Relation Age of Onset  . Asthma Mother   . Eczema Mother   . Sarcoidosis Mother   . Hypertension Mother   . Cancer Father   . Hypotension Father   . Asthma Sister   . GER disease Sister   . Allergic rhinitis Neg Hx   . Angioedema Neg Hx   . Immunodeficiency Neg Hx   . Urticaria Neg Hx    Past Surgical History:  Procedure Laterality Date  . ABDOMINAL HYSTERECTOMY     partial age 53  . BILATERAL SALPINGECTOMY Bilateral 05/25/2015   Procedure: BILATERAL SALPINGECTOMY;  Surgeon: Servando Salina, MD;  Location: [facility];  Service: Gynecology;  Laterality: Bilateral;  . OVARIAN CYST REMOVAL Right 05/25/2015   Procedure: OVARIAN CYSTECTOMY;  Surgeon: Servando Salina, MD;  Location: [facility];  Service: Gynecology;  Laterality: Right;  . TONSILLECTOMY    . TUBAL LIGATION        Vanessa Kick, MD 12/09/17 [phone]

## 2017-12-12 ENCOUNTER — Ambulatory Visit (INDEPENDENT_AMBULATORY_CARE_PROVIDER_SITE_OTHER): Payer: 59 | Admitting: *Deleted

## 2017-12-12 DIAGNOSIS — J4540 Moderate persistent asthma, uncomplicated: Secondary | ICD-10-CM

## 2017-12-13 ENCOUNTER — Telehealth: Payer: Self-pay | Admitting: Rheumatology

## 2017-12-13 NOTE — Telephone Encounter (Signed)
Patient called requesting a prescription for pain.  Patient states that she is having a difficult time "holding a cup of coffee."

## 2017-12-16 NOTE — Telephone Encounter (Signed)
Patient states she is hurting. Patient states she is hurting in both hands, both ankles, both knees and all over. Patient states she is on amitriptyline and that is not helping. Patient has RA and was recently told that she may have Fibromyalgia. She is wanting to know if there is anything else that can be done for this pain. Patient states this does not feel like her typical RA flare. Patient states she is taking her PLQ as prescribed. Please advise.

## 2017-12-16 NOTE — Telephone Encounter (Signed)
Attempted to contact the patient and unable to leave a message. Mailbox is not set up.

## 2017-12-17 NOTE — Telephone Encounter (Signed)
For evaluation of fibromyalgia and treatment of fibromyalgia she should see her PCP.

## 2017-12-17 NOTE — Telephone Encounter (Signed)
Patient advised to see PCP for evaluation and treatment of Fibromyalgia.

## 2017-12-23 ENCOUNTER — Other Ambulatory Visit: Payer: Self-pay | Admitting: Rheumatology

## 2017-12-23 NOTE — Telephone Encounter (Signed)
Last visit: 11/14/2017 Next visit: 04/16/2018 Labs: 11/14/2017 WNL  Eye exam: 01/2017 per patient.   Advised patient to have copy of eye exam sent over, patient states she is scheduled this month and will have it faxed over.   Okay to refill 30 day supply per Dr. Estanislado Pandy.

## 2017-12-25 ENCOUNTER — Ambulatory Visit
Admission: RE | Admit: 2017-12-25 | Discharge: 2017-12-25 | Disposition: A | Discharge status: Home / Self Care | Payer: 59 | Source: Ambulatory Visit | Attending: Family | Admitting: Family

## 2017-12-25 DIAGNOSIS — Z1231 Encounter for screening mammogram for malignant neoplasm of breast: Secondary | ICD-10-CM

## 2017-12-26 ENCOUNTER — Other Ambulatory Visit: Payer: Self-pay | Admitting: Family

## 2017-12-26 DIAGNOSIS — R928 Other abnormal and inconclusive findings on diagnostic imaging of breast: Secondary | ICD-10-CM

## 2017-12-31 ENCOUNTER — Ambulatory Visit
Admission: RE | Admit: 2017-12-31 | Discharge: 2017-12-31 | Disposition: A | Discharge status: Home / Self Care | Payer: 59 | Source: Ambulatory Visit | Attending: Family | Admitting: Family

## 2017-12-31 ENCOUNTER — Ambulatory Visit: Payer: 59

## 2017-12-31 DIAGNOSIS — R928 Other abnormal and inconclusive findings on diagnostic imaging of breast: Secondary | ICD-10-CM

## 2018-01-09 ENCOUNTER — Ambulatory Visit (INDEPENDENT_AMBULATORY_CARE_PROVIDER_SITE_OTHER): Payer: 59 | Admitting: *Deleted

## 2018-01-09 DIAGNOSIS — J4540 Moderate persistent asthma, uncomplicated: Secondary | ICD-10-CM | POA: Diagnosis not present

## 2018-01-16 IMAGING — MG DIGITAL SCREENING BILATERAL MAMMOGRAM WITH CAD
4 series · 4 of 4 positions shown · non-contrast
Comparison: Previous exam(s).

CLINICAL DATA: Screening.

EXAM:
DIGITAL SCREENING BILATERAL MAMMOGRAM WITH CAD

[R CC]
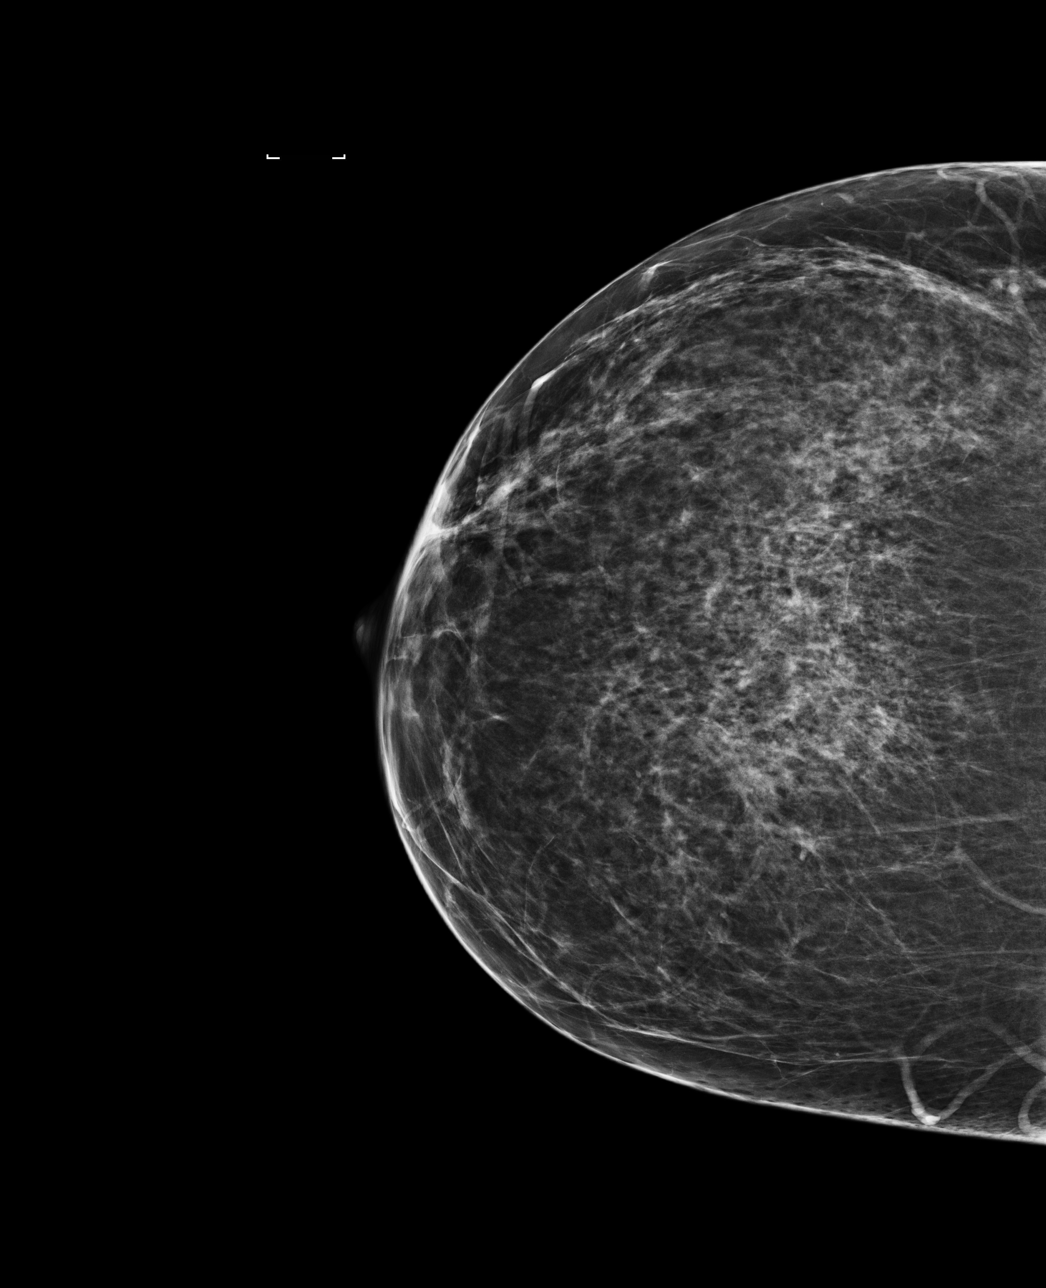

[L CC]
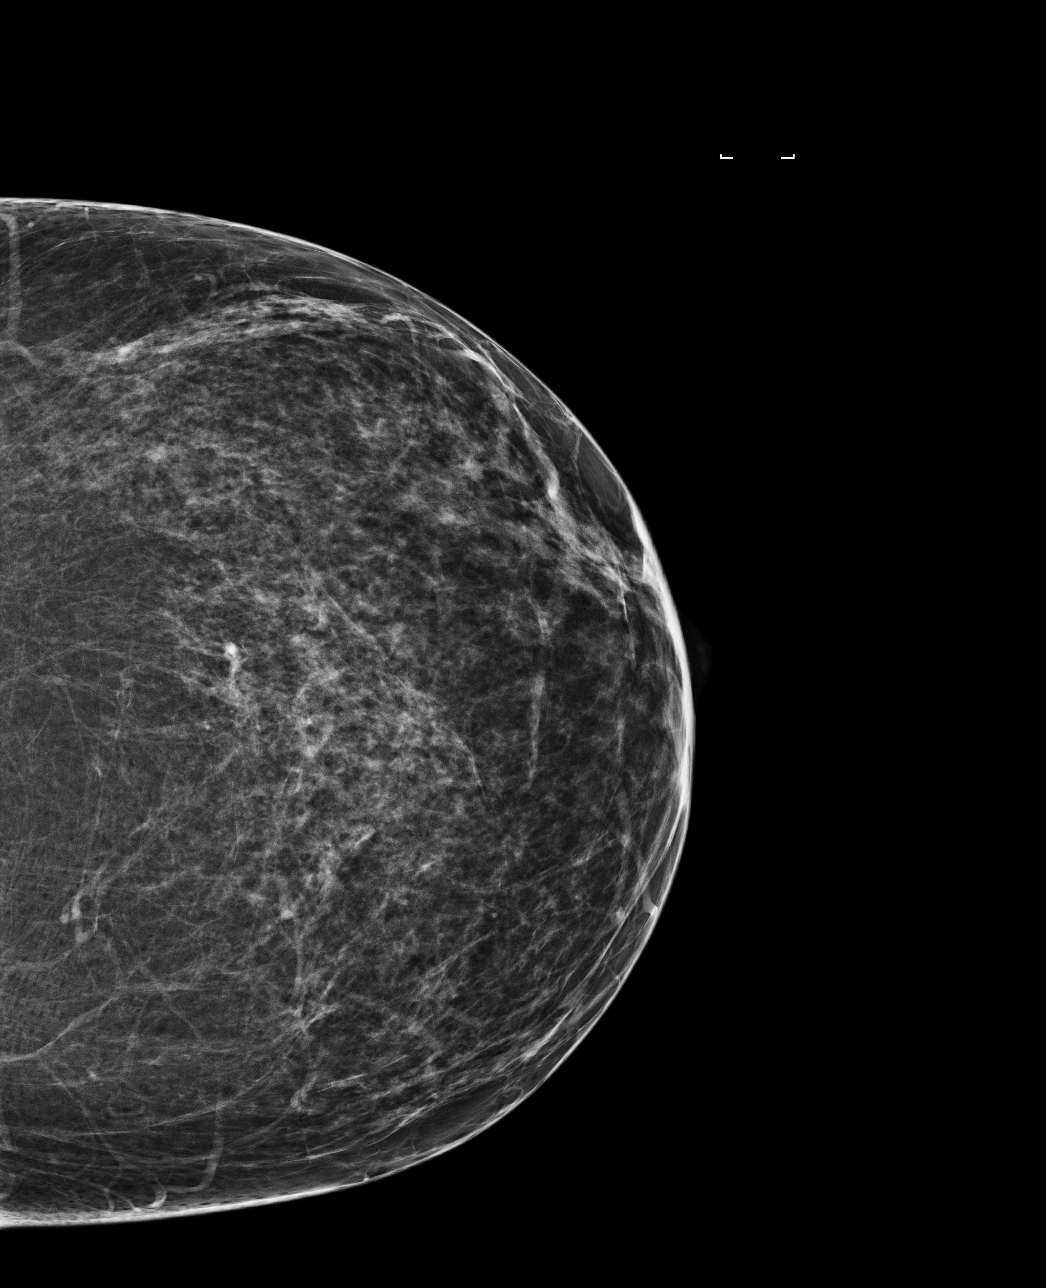

[R MLO]
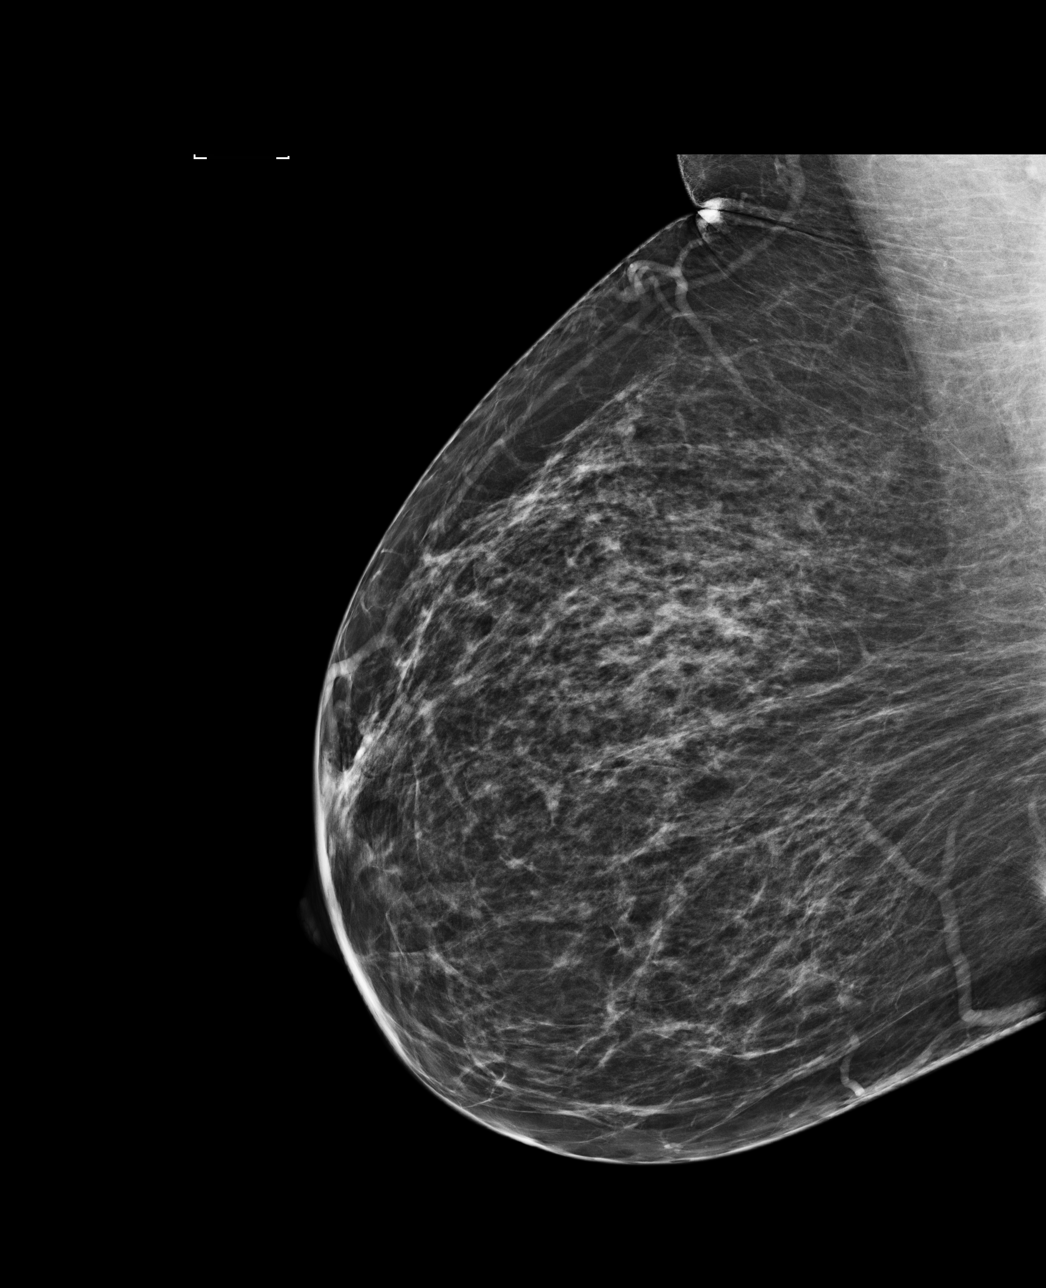

[L MLO]
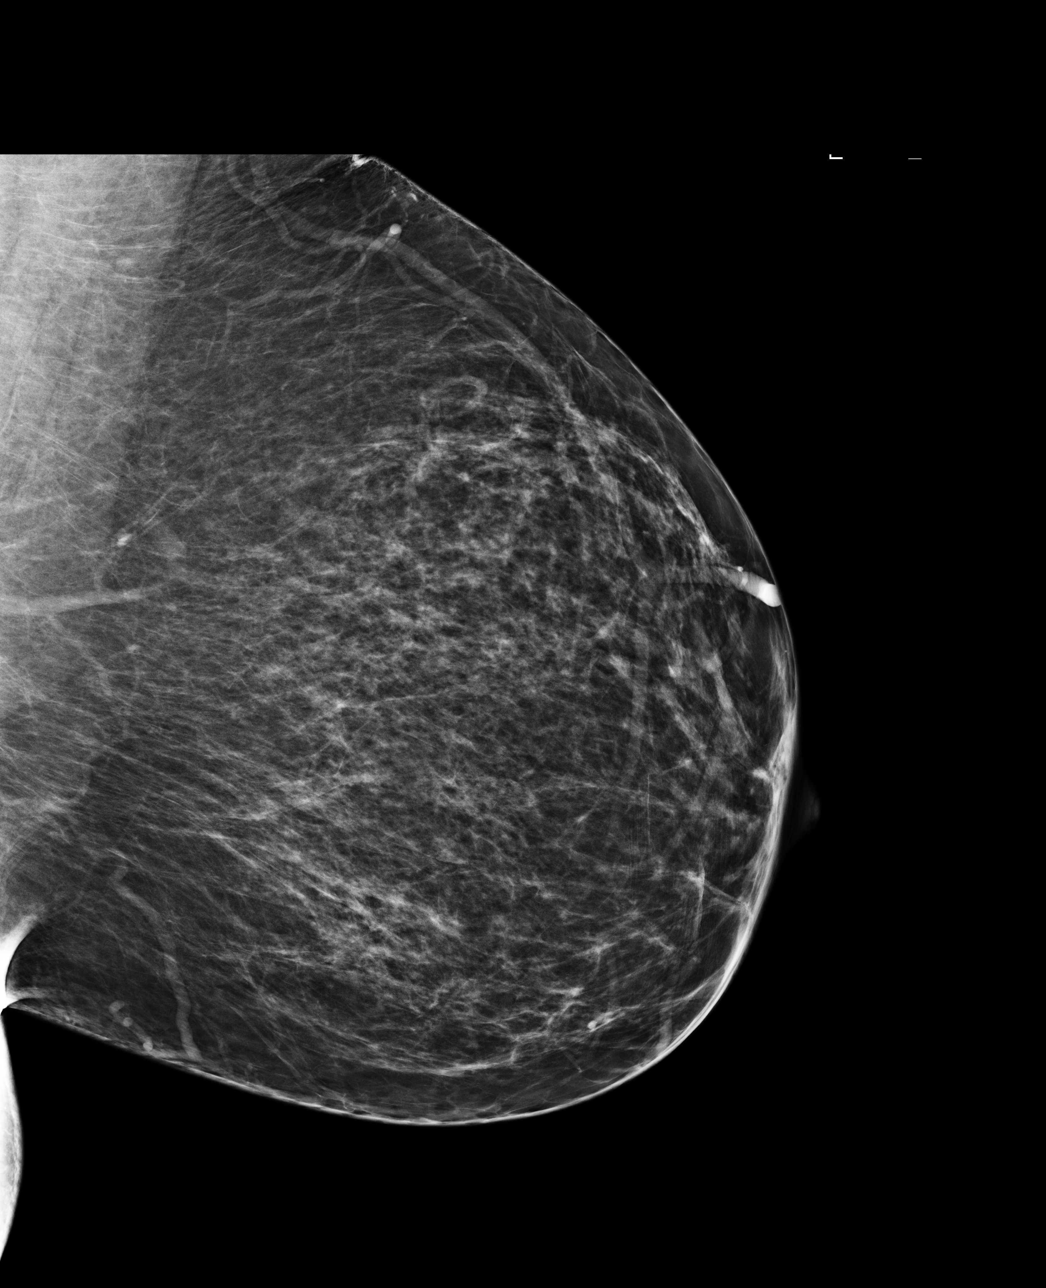

[4 of 4 positions shown; findings below may reference images not displayed]

ACR Breast Density Category c: The breast tissue is heterogeneously
dense, which may obscure small masses.
FINDINGS: There are no findings suspicious for malignancy. Images were
processed with CAD.
IMPRESSION: No mammographic evidence of malignancy. A result letter of this
screening mammogram will be mailed directly to the patient.

RECOMMENDATION:
Screening mammogram in one year. (Code:YJ-2-FEZ)

BI-RADS CATEGORY  1: Negative.

## 2018-02-01 ENCOUNTER — Other Ambulatory Visit: Payer: Self-pay | Admitting: Allergy & Immunology

## 2018-02-06 ENCOUNTER — Ambulatory Visit (INDEPENDENT_AMBULATORY_CARE_PROVIDER_SITE_OTHER): Payer: 59

## 2018-02-06 DIAGNOSIS — J4540 Moderate persistent asthma, uncomplicated: Secondary | ICD-10-CM | POA: Diagnosis not present

## 2018-02-12 NOTE — Progress Notes (Deleted)
Office Visit Note  Patient: Julie [name]             Date of Birth: [birth year]           MRN: [id number]             PCP: [name], FNP Referring: [name], FNP Visit Date: 02/13/2018 Occupation: @GUAROCC @    Subjective:  No chief complaint on file.   History of Present Illness: Julie [name] is a 52 y.o. female ***   Activities of Daily Living:  Patient reports morning stiffness for *** {minute/hour:19697}.   Patient {ACTIONS;DENIES/REPORTS:21021675::"Denies"} nocturnal pain.  Difficulty dressing/grooming: {ACTIONS;DENIES/REPORTS:21021675::"Denies"} Difficulty climbing stairs: {ACTIONS;DENIES/REPORTS:21021675::"Denies"} Difficulty getting out of chair: {ACTIONS;DENIES/REPORTS:21021675::"Denies"} Difficulty using hands for taps, buttons, cutlery, and/or writing: {ACTIONS;DENIES/REPORTS:21021675::"Denies"}   No Rheumatology ROS completed.   PMFS History:  Patient Active Problem List   Diagnosis Date Noted  . Rheumatoid arthritis involving multiple sites with positive rheumatoid factor ([place]) 11/05/2016  . High risk medication use 11/05/2016  . LPRD (laryngopharyngeal reflux disease) 09/20/2015  . Mild persistent asthma 09/20/2015  . Allergic rhinoconjunctivitis 09/20/2015  . Acute sinusitis 07/29/2015  . Asthma with acute exacerbation 07/29/2015  . S/P total hysterectomy 05/25/2015    Past Medical History:  Diagnosis Date  . Asthma   . Complication of anesthesia   . Eczema   . PONV (postoperative nausea and vomiting)   . Rheumatoid arthritis ([place])     Family History  Problem Relation Age of Onset  . Asthma Mother   . Eczema Mother   . Sarcoidosis Mother   . Hypertension Mother   . Cancer Father   . Hypotension Father   . Asthma Sister   . GER disease Sister   . Allergic rhinitis Neg Hx   . Angioedema Neg Hx   . Immunodeficiency Neg Hx   . Urticaria Neg Hx    Past Surgical History:  Procedure Laterality Date  . ABDOMINAL  HYSTERECTOMY     partial age 32  . BILATERAL SALPINGECTOMY Bilateral 05/25/2015   Procedure: BILATERAL SALPINGECTOMY;  Surgeon: Servando Salina, MD;  Location: [facility];  Service: Gynecology;  Laterality: Bilateral;  . OVARIAN CYST REMOVAL Right 05/25/2015   Procedure: OVARIAN CYSTECTOMY;  Surgeon: Servando Salina, MD;  Location: [facility];  Service: Gynecology;  Laterality: Right;  . TONSILLECTOMY    . TUBAL LIGATION     Social History   Social History Narrative  . Not on file     Objective: Vital Signs: LMP 05/19/2015    Physical Exam   Musculoskeletal Exam: ***  CDAI Exam: No CDAI exam completed.    Investigation: No additional findings. CBC Latest Ref Rng & Units 11/14/2017 05/29/2017 03/26/2017  WBC 3.8 - 10.8 Thousand/uL 5.1 5.1 3.7(L)  Hemoglobin 11.7 - 15.5 g/dL 13.3 12.3 11.8  Hematocrit 35.0 - 45.0 % 39.2 36.3 36.2  Platelets 140 - 400 Thousand/uL 238 228 261   CMP Latest Ref Rng & Units 11/14/2017 05/29/2017 03/26/2017  Glucose 65 - 99 mg/dL 85 90 100(H)  BUN 7 - 25 mg/dL 11 7 11   Creatinine 0.50 - 1.10 mg/dL 0.67 0.64 0.68  Sodium 135 - 146 mmol/L 136 138 138  Potassium 3.5 - 5.3 mmol/L 4.3 4.9 4.2  Chloride 98 - 110 mmol/L 100 102 103  CO2 20 - 32 mmol/L 30 30 24   Calcium 8.6 - 10.2 mg/dL 9.1 8.9 8.9  Total Protein 6.1 - 8.1 g/dL 7.4 7.1 6.6  Total Bilirubin 0.2 - 1.2 mg/dL  0.5 0.2 0.4  Alkaline Phos 33 - 115 U/L - - 55  AST 10 - 35 U/L 17 19 15   ALT 6 - 29 U/L 12 15 13      Imaging: No results found.  Speciality Comments: No specialty comments available.    Procedures:  No procedures performed Allergies: Aspirin and Augmentin [amoxicillin-pot clavulanate]   Assessment / Plan:     Visit Diagnoses: Rheumatoid arthritis involving multiple sites with positive rheumatoid factor (HCC) - RF+, CCP +  High risk medication use - MTX 0.8 ml every week, folic acid 2 mg daily, PLQ 200 mg BID M-F   Trigger middle finger of right  hand  Fibromyalgia  History of gastroesophageal reflux (GERD)  History of depression  History of asthma    Orders: No orders of the defined types were placed in this encounter.  No orders of the defined types were placed in this encounter.   Face-to-face time spent with patient was *** minutes. 50% of time was spent in counseling and coordination of care.  Follow-Up Instructions: No follow-ups on file.   Ofilia Neas, PA-C  Note - This record has been created using Dragon software.  Chart creation errors have been sought, but may not always  have been located. Such creation errors do not reflect on  the standard of medical care.

## 2018-02-13 ENCOUNTER — Ambulatory Visit: Payer: 59 | Admitting: Physician Assistant

## 2018-02-14 ENCOUNTER — Ambulatory Visit: Payer: 59 | Admitting: Rheumatology

## 2018-02-14 ENCOUNTER — Encounter: Payer: Self-pay | Admitting: Rheumatology

## 2018-02-14 VITALS — BP 98/62 | HR 77 | Resp 16 | Ht 61.25 in | Wt 180.0 lb

## 2018-02-14 DIAGNOSIS — Z8709 Personal history of other diseases of the respiratory system: Secondary | ICD-10-CM

## 2018-02-14 DIAGNOSIS — M0579 Rheumatoid arthritis with rheumatoid factor of multiple sites without organ or systems involvement: Secondary | ICD-10-CM | POA: Diagnosis not present

## 2018-02-14 DIAGNOSIS — Z79899 Other long term (current) drug therapy: Secondary | ICD-10-CM

## 2018-02-14 DIAGNOSIS — M797 Fibromyalgia: Secondary | ICD-10-CM

## 2018-02-14 DIAGNOSIS — Z8719 Personal history of other diseases of the digestive system: Secondary | ICD-10-CM

## 2018-02-14 MED ORDER — PREDNISONE 5 MG PO TABS: ORAL_TABLET | ORAL | 0 refills | Status: DC

## 2018-02-14 NOTE — Progress Notes (Signed)
Office Visit Note  Patient: Julie [name]             Date of Birth: 11/21/1965           MRN: [id number]             PCP: [name], FNP Referring: [name], FNP Visit Date: 02/14/2018 Occupation: @GUAROCC @    Subjective:  Pain and swelling in hands.   History of Present Illness: Julie [name] is a 52 y.o. female with history of seropositive rheumatoid arthritis.  She states she has been having pain and swelling in her bilateral hands.  She had sinus infection recently and had to come off methotrexate.  She resumed methotrexate about 3 weeks ago.  She was off methotrexate for approximately 2 months.  She has been taking Plaquenil on regular basis.  Activities of Daily Living:  Patient reports morning stiffness for 5 minutes.   Patient Denies nocturnal pain.  Difficulty dressing/grooming: Denies Difficulty climbing stairs: Denies Difficulty getting out of chair: Denies Difficulty using hands for taps, buttons, cutlery, and/or writing: Reports   Review of Systems  Constitutional: Negative for fatigue, night sweats, weight gain and weight loss.  HENT: Negative for mouth sores, trouble swallowing, trouble swallowing, mouth dryness and nose dryness.   Eyes: Negative for pain, redness, visual disturbance and dryness.  Respiratory: Negative for cough, shortness of breath and difficulty breathing.   Cardiovascular: Negative for chest pain, palpitations, hypertension, irregular heartbeat and swelling in legs/feet.  Gastrointestinal: Negative for blood in stool, constipation and diarrhea.  Endocrine: Negative for increased urination.  Genitourinary: Negative for vaginal dryness.  Musculoskeletal: Positive for arthralgias, joint pain and morning stiffness. Negative for joint swelling, myalgias, muscle weakness, muscle tenderness and myalgias.  Skin: Negative for color change, rash, hair loss, skin tightness, ulcers and sensitivity to sunlight.    Allergic/Immunologic: Negative for susceptible to infections.  Neurological: Negative for dizziness, memory loss, night sweats and weakness.  Hematological: Negative for swollen glands.  Psychiatric/Behavioral: Negative for depressed mood and sleep disturbance. The patient is not nervous/anxious.     PMFS History:  Patient Active Problem List   Diagnosis Date Noted  . Fibromyalgia 02/14/2018  . History of gastroesophageal reflux (GERD) 02/14/2018  . History of asthma 02/14/2018  . Rheumatoid arthritis involving multiple sites with positive rheumatoid factor ([place]) 11/05/2016  . High risk medication use 11/05/2016  . LPRD (laryngopharyngeal reflux disease) 09/20/2015  . Mild persistent asthma 09/20/2015  . Allergic rhinoconjunctivitis 09/20/2015  . Acute sinusitis 07/29/2015  . Asthma with acute exacerbation 07/29/2015  . S/P total hysterectomy 05/25/2015    Past Medical History:  Diagnosis Date  . Asthma   . Complication of anesthesia   . Eczema   . PONV (postoperative nausea and vomiting)   . Rheumatoid arthritis ([place])     Family History  Problem Relation Age of Onset  . Asthma Mother   . Eczema Mother   . Sarcoidosis Mother   . Hypertension Mother   . Cancer Father   . Hypotension Father   . Asthma Sister   . GER disease Sister   . Allergic rhinitis Neg Hx   . Angioedema Neg Hx   . Immunodeficiency Neg Hx   . Urticaria Neg Hx    Past Surgical History:  Procedure Laterality Date  . ABDOMINAL HYSTERECTOMY     partial age 52  . BILATERAL SALPINGECTOMY Bilateral 05/25/2015   Procedure: BILATERAL SALPINGECTOMY;  Surgeon: Servando Salina, MD;  Location: [facility];  Service: Gynecology;  Laterality: Bilateral;  . OVARIAN CYST REMOVAL Right 05/25/2015   Procedure: OVARIAN CYSTECTOMY;  Surgeon: Servando Salina, MD;  Location: [facility];  Service: Gynecology;  Laterality: Right;  . TONSILLECTOMY    . TUBAL LIGATION     Social History   Social History Narrative  . Not on  file     Objective: Vital Signs: BP 98/62 (BP Location: Right Arm, Patient Position: Sitting, Cuff Size: Normal)   Pulse 77   Resp 16   Ht 5' 1.25" (1.556 m)   Wt 180 lb (81.6 kg)   LMP 05/19/2015   BMI 33.73 kg/m    Physical Exam  Constitutional: She is oriented to person, place, and time. She appears well-developed and well-nourished.  HENT:  Head: Normocephalic and atraumatic.  Eyes: Conjunctivae and EOM are normal.  Neck: Normal range of motion.  Cardiovascular: Normal rate, regular rhythm, normal heart sounds and intact distal pulses.  Pulmonary/Chest: Effort normal and breath sounds normal.  Abdominal: Soft. Bowel sounds are normal.  Lymphadenopathy:    She has no cervical adenopathy.  Neurological: She is alert and oriented to person, place, and time.  Skin: Skin is warm and dry. Capillary refill takes less than 2 seconds.  Psychiatric: She has a normal mood and affect. Her behavior is normal.  Nursing note and vitals reviewed.    Musculoskeletal Exam: Spine thoracic lumbar spine good range of motion.  Shoulder joints elbow joints wrist joints are good range of motion.  She has incomplete fist formation bilaterally due to swelling in her hands as described below.  Hip joints knee joints ankles MTPs PIPs been good range of motion.  He has some generalized hyperalgesia due to fibromyalgia.  CDAI Exam: CDAI Homunculus Exam:   Tenderness:  Right hand: 3rd MCP, 3rd PIP and 4th PIP Left hand: 3rd MCP and 3rd PIP  Swelling:  Right hand: 3rd MCP, 3rd PIP and 4th PIP Left hand: 3rd MCP and 3rd PIP  Joint Counts:  CDAI Tender Joint count: 5 CDAI Swollen Joint count: 5  Global Assessments:  Patient Global Assessment: 3 Provider Global Assessment: 3  CDAI Calculated Score: 16    Investigation: No additional findings. CBC Latest Ref Rng & Units 11/14/2017 05/29/2017 03/26/2017  WBC 3.8 - 10.8 Thousand/uL 5.1 5.1 3.7(L)  Hemoglobin 11.7 - 15.5 g/dL 13.3 12.3 11.8    Hematocrit 35.0 - 45.0 % 39.2 36.3 36.2  Platelets 140 - 400 Thousand/uL 238 228 261   CMP     Component Value Date/Time   NA 136 11/14/2017 1403   NA 140 10/02/2016 1602   K 4.3 11/14/2017 1403   CL 100 11/14/2017 1403   CO2 30 11/14/2017 1403   GLUCOSE 85 11/14/2017 1403   BUN 11 11/14/2017 1403   BUN 8 10/02/2016 1602   CREATININE 0.67 11/14/2017 1403   CALCIUM 9.1 11/14/2017 1403   PROT 7.4 11/14/2017 1403   PROT 7.1 10/02/2016 1602   ALBUMIN 3.7 03/26/2017 1244   ALBUMIN 4.1 10/02/2016 1602   AST 17 11/14/2017 1403   ALT 12 11/14/2017 1403   ALKPHOS 55 03/26/2017 1244   BILITOT 0.5 11/14/2017 1403   BILITOT 0.4 10/02/2016 1602   GFRNONAA 103 11/14/2017 1403   GFRAA 120 11/14/2017 1403    Imaging: No results found.  Speciality Comments: No specialty comments available.    Procedures:  No procedures performed Allergies: Aspirin and Augmentin [amoxicillin-pot clavulanate]   Assessment / Plan:     Visit  Diagnoses: Rheumatoid arthritis involving multiple sites with positive rheumatoid factor (HCC)-is having a flare as she missed methotrexate dose for 2 months.  She resumed methotrexate 3 weeks ago.  She has been taking Plaquenil.  She has significant swelling in her hands today.  I will give her prednisone taper starting at 20 mg and taper by 5 mg every 2 days.  High risk medication use - Plan: CBC with Differential/Platelet, COMPLETE METABOLIC PANEL WITH GFR today and every 3 months.  Fibromyalgia-he continues to have some generalized pain and discomfort.  History of gastroesophageal reflux (GERD)  History of asthma   Association of heart disease with rheumatoid arthritis was discussed. Need to monitor blood pressure, cholesterol, and to exercise 30-60 minutes on daily basis was discussed. Poor dental hygiene can be a predisposing factor for rheumatoid arthritis. Good dental hygiene was discussed. Orders: Orders Placed This Encounter  Procedures  . CBC with  Differential/Platelet  . COMPLETE METABOLIC PANEL WITH GFR   Meds ordered this encounter  Medications  . predniSONE (DELTASONE) 5 MG tablet    Sig: Take 4 tablets x2 days, 3 tablets x2 days, 2 tablets x2 days, 1 tablet x2 days, 1/2 tablet x2 days.    Dispense:  21 tablet    Refill:  0    Face-to-face time spent with patient was 30 minutes.> 50% of time was spent in counseling and coordination of care.  Follow-Up Instructions: Return in about 5 months (around 07/17/2018) for Rheumatoid arthritis.   Bo Merino, MD  Note - This record has been created using Editor, commissioning.  Chart creation errors have been sought, but may not always  have been located. Such creation errors do not reflect on  the standard of medical care.

## 2018-02-14 NOTE — Patient Instructions (Signed)
Standing Labs We placed an order today for your standing lab work.    Please come back and get your standing labs in August and every 3 months   We have open lab Monday through Friday from 8:30-11:30 AM and 1:30-4:00 PM  at the office of Dr. Dariella Gillihan.   You may experience shorter wait times on Monday and Friday afternoons. The office is located at [address], [address], [place], [place] [address] No appointment is necessary.   Labs are drawn by [facility].  You may receive a bill from [facility] for your lab work. If you have any questions regarding directions or hours of operation,  please call [phone].    

## 2018-02-15 LAB — CBC WITH DIFFERENTIAL/PLATELET
Basophils Absolute: 32 cells/uL (ref 0–200)
Basophils Relative: 0.7 %
Eosinophils Absolute: 212 cells/uL (ref 15–500)
Eosinophils Relative: 4.6 %
HCT: 36.7 % (ref 35.0–45.0)
Hemoglobin: 12.3 g/dL (ref 11.7–15.5)
Lymphs Abs: 1693 cells/uL (ref 850–3900)
MCH: 27.6 pg (ref 27.0–33.0)
MCHC: 33.5 g/dL (ref 32.0–36.0)
MCV: 82.3 fL (ref 80.0–100.0)
MPV: 10.9 fL (ref 7.5–12.5)
Monocytes Relative: 7.7 %
Neutro Abs: 2309 cells/uL (ref 1500–7800)
Neutrophils Relative %: 50.2 %
Platelets: 250 10*3/uL (ref 140–400)
RBC: 4.46 10*6/uL (ref 3.80–5.10)
RDW: 11.7 % (ref 11.0–15.0)
Total Lymphocyte: 36.8 %
WBC mixed population: 354 cells/uL (ref 200–950)
WBC: 4.6 10*3/uL (ref 3.8–10.8)

## 2018-02-15 LAB — COMPLETE METABOLIC PANEL WITH GFR
AG Ratio: 1.3 (calc) (ref 1.0–2.5)
ALT: 14 U/L (ref 6–29)
AST: 16 U/L (ref 10–35)
Albumin: 4 g/dL (ref 3.6–5.1)
Alkaline phosphatase (APISO): 80 U/L (ref 33–115)
BUN: 10 mg/dL (ref 7–25)
CO2: 27 mmol/L (ref 20–32)
Calcium: 9.2 mg/dL (ref 8.6–10.2)
Chloride: 103 mmol/L (ref 98–110)
Creat: 0.67 mg/dL (ref 0.50–1.10)
GFR, Est African American: 120 mL/min/{1.73_m2} (ref >=60)
GFR, Est Non African American: 103 mL/min/{1.73_m2} (ref >=60)
Globulin: 3.1 g/dL (calc) (ref 1.9–3.7)
Glucose, Bld: 92 mg/dL (ref 65–99)
Potassium: 4.6 mmol/L (ref 3.5–5.3)
Sodium: 136 mmol/L (ref 135–146)
Total Bilirubin: 0.4 mg/dL (ref 0.2–1.2)
Total Protein: 7.1 g/dL (ref 6.1–8.1)

## 2018-03-06 ENCOUNTER — Ambulatory Visit (INDEPENDENT_AMBULATORY_CARE_PROVIDER_SITE_OTHER): Payer: 59 | Admitting: *Deleted

## 2018-03-06 DIAGNOSIS — J4540 Moderate persistent asthma, uncomplicated: Secondary | ICD-10-CM | POA: Diagnosis not present

## 2018-03-09 ENCOUNTER — Other Ambulatory Visit: Payer: Self-pay | Admitting: Allergy & Immunology

## 2018-03-29 ENCOUNTER — Other Ambulatory Visit: Payer: Self-pay | Admitting: Rheumatology

## 2018-03-31 NOTE — Telephone Encounter (Addendum)
Last visit: 11/14/2017 Next visit: 04/16/2018 Labs: 11/14/2017 WNL  Eye exam: 01/2017 per patient  Patient has update PLQ eye exam and will have the results faxed to our office  Okay to refill per Dr. Estanislado Pandy

## 2018-04-01 ENCOUNTER — Ambulatory Visit: Payer: 59

## 2018-04-01 ENCOUNTER — Encounter: Payer: Self-pay | Admitting: Allergy and Immunology

## 2018-04-01 ENCOUNTER — Ambulatory Visit (INDEPENDENT_AMBULATORY_CARE_PROVIDER_SITE_OTHER): Payer: 59 | Admitting: Allergy and Immunology

## 2018-04-01 VITALS — BP 106/72 | HR 90 | Resp 18

## 2018-04-01 DIAGNOSIS — K219 Gastro-esophageal reflux disease without esophagitis: Secondary | ICD-10-CM | POA: Diagnosis not present

## 2018-04-01 DIAGNOSIS — J45901 Unspecified asthma with (acute) exacerbation: Secondary | ICD-10-CM

## 2018-04-01 DIAGNOSIS — J3089 Other allergic rhinitis: Secondary | ICD-10-CM

## 2018-04-01 DIAGNOSIS — J4540 Moderate persistent asthma, uncomplicated: Secondary | ICD-10-CM

## 2018-04-01 NOTE — Patient Instructions (Addendum)
    1.  Continue Xolair and EpiPen  2. Continue QVAR 80 Redihaler two inhalations two times per day  3. Continue dexilant 60 in AM + ranitidine 300 in PM  4. Continue montelukast 10 mg one time per day  5. Continue ProAir two inhalations every 4-6 hours if needed.   6. Use Xyzal 5mg  one time per day if needed  7. Return to clinic December 2019 or earlier if problem  8. Obtain fall flu vaccine

## 2018-04-01 NOTE — Progress Notes (Signed)
Follow-up Note  Referring Provider: [name], FNP Primary Provider: [name], FNP Date of Office Visit: 04/01/2018  Subjective:   Julie [name] (DOB: 11-Feb-1966) is a 52 y.o. female who returns to the [facility] on 04/01/2018 in re-evaluation of the following:  HPI: Julie presents to this clinic in evaluation of asthma and allergic rhinitis and reflux induced respiratory disease.  I have not seen her in this clinic in close to a year.  Her last visit in this clinic was with Dr. Ernst Bowler on 12 September 2017.  Since restarting omalizumab injection she has really had excellent control of her atopic respiratory and rarely uses a short acting bronchodilator at this point and has not required the administration of a systemic steroid or antibiotic for any respiratory tract issue.  She continues to use Qvar and montelukast on a consistent basis and also continues on an antihistamine.  Her reflux is under excellent control at this point time and she has had no issues with her throat.  She continues on a proton pump inhibitor and H2 receptor blocker.  Allergies as of 04/01/2018      Reactions   Aspirin Other (See Comments)   pt couldn't take while on methotrexate, but is no longer taking   Augmentin [amoxicillin-pot Clavulanate] Other (See Comments)   G I upset      Medication List      amitriptyline 25 MG tablet Commonly known as:  ELAVIL Take 50 mg by mouth at bedtime.   B-D TB SYRINGE 1CC/27GX1/2" 27G X 1/2" 1 ML Misc Generic drug:  TUBERCULIN SYR 1CC/27GX1/2" Inject 1 Syringe as directed once a week.   beclomethasone 80 MCG/ACT inhaler Commonly known as:  QVAR REDIHALER Inhale 2 puffs into the lungs 2 (two) times daily.   cetirizine 10 MG tablet Commonly known as:  ZYRTEC Take 10 mg by mouth daily.   DAILY MULTIVITAMIN PO Take by mouth daily.   dexlansoprazole 60 MG capsule Commonly known as:  DEXILANT TAKE ONE DAILY IN THE MORNING  TO PREVENT REFLUX.   diclofenac sodium 1 % Gel Commonly known as:  VOLTAREN Apply 3 gm to 3 large joints up to 3 times a day.Dispense 3 tubes with 3 refills.   EPINEPHrine 0.3 mg/0.3 mL Soaj injection Commonly known as:  EPI-PEN INJ 0.3 ML IM ONCE UTD   escitalopram 20 MG tablet Commonly known as:  LEXAPRO Take 20 mg by mouth daily.   folic acid 1 MG tablet Commonly known as:  FOLVITE Take 1 tablet (1 mg total) by mouth 2 (two) times daily.   hydroxychloroquine 200 MG tablet Commonly known as:  PLAQUENIL TAKE 1 TABLET BY MOUTH TWICE A DAY MONDAY THROUGH FRIDAY   levocetirizine 5 MG tablet Commonly known as:  XYZAL TAKE 1 TABLET BY MOUTH AT BEDTIME   methotrexate 1 g injection Commonly known as:  50 mg/ml   montelukast 10 MG tablet Commonly known as:  SINGULAIR TAKE 1 TABLET BY MOUTH AT BEDTIME   PROAIR HFA 108 (90 Base) MCG/ACT inhaler Generic drug:  albuterol TAKE 2 PUFFS EVERY 4 TO 6 HOURS AS NEEDED FOR COUGH/WHEEZE   ranitidine 300 MG tablet Commonly known as:  ZANTAC TAKE 1 TABLET BY MOUTH EVERYDAY AT BEDTIME       Past Medical History:  Diagnosis Date  . Asthma   . Complication of anesthesia   . Eczema   . PONV (postoperative nausea and vomiting)   . Rheumatoid arthritis (  [facility])     Past Surgical History:  Procedure Laterality Date  . ABDOMINAL HYSTERECTOMY     partial age 78  . BILATERAL SALPINGECTOMY Bilateral 05/25/2015   Procedure: BILATERAL SALPINGECTOMY;  Surgeon: Servando Salina, MD;  Location: [facility];  Service: Gynecology;  Laterality: Bilateral;  . OVARIAN CYST REMOVAL Right 05/25/2015   Procedure: OVARIAN CYSTECTOMY;  Surgeon: Servando Salina, MD;  Location: [facility];  Service: Gynecology;  Laterality: Right;  . TONSILLECTOMY    . TUBAL LIGATION      Review of systems negative except as noted in HPI / PMHx or noted below:  Review of Systems  Constitutional: Negative.   HENT: Negative.   Eyes: Negative.   Respiratory: Negative.     Cardiovascular: Negative.   Gastrointestinal: Negative.   Genitourinary: Negative.   Musculoskeletal: Negative.   Skin: Negative.   Neurological: Negative.   Endo/Heme/Allergies: Negative.   Psychiatric/Behavioral: Negative.      Objective:   Vitals:   04/01/18 1731  BP: 106/72  Pulse: 90  Resp: 18  SpO2: 98%          Physical Exam  HENT:  Head: Normocephalic.  Right Ear: Tympanic membrane, external ear and ear canal normal.  Left Ear: Tympanic membrane, external ear and ear canal normal.  Nose: Nose normal. No mucosal edema or rhinorrhea.  Mouth/Throat: Oropharynx is clear and moist and mucous membranes are normal. No oropharyngeal exudate.  Eyes: Conjunctivae are normal.  Neck: Trachea normal. No tracheal tenderness present. No tracheal deviation present. No thyromegaly present.  Cardiovascular: Normal rate, regular rhythm, S1 normal, S2 normal and normal heart sounds.  No murmur heard. Pulmonary/Chest: Breath sounds normal. No stridor. No respiratory distress. She has no wheezes. She has no rales.  Musculoskeletal: She exhibits no edema.  Lymphadenopathy:       Head (right side): No tonsillar adenopathy present.       Head (left side): No tonsillar adenopathy present.    She has no cervical adenopathy.  Neurological: She is alert.  Skin: No rash noted. She is not diaphoretic. No erythema. Nails show no clubbing.    Diagnostics:    Spirometry was performed and demonstrated an FEV1 of 1.80 at 83 % of predicted.  The patient had an Asthma Control Test with the following results: ACT Total Score: 25.    Assessment and Plan:   1. Asthma, moderate persistent, well-controlled   2. Other allergic rhinitis   3. LPRD (laryngopharyngeal reflux disease)      1.  Continue Xolair and EpiPen  2. Continue QVAR 80 Redihaler two inhalations two times per day  3. Continue dexilant 60 in AM + ranitidine 300 in PM  4. Continue montelukast 10 mg one time per day  5.  Continue ProAir two inhalations every 4-6 hours if needed.   6. Use Xyzal 5mg  one time per day if needed  7. Return to clinic December 2019 or earlier if problem  8. Obtain fall flu vaccine  Overall Julie has really has really done very well since she restarting her omalizumab injections and we will continue to have her use the plan of therapy noted above in addition to continuing on therapy for reflux and see her back in his clinic in December 2019 or earlier if there is a problem.    Allena Katz, MD Allergy / Immunology [place]

## 2018-04-02 ENCOUNTER — Encounter: Payer: Self-pay | Admitting: Allergy and Immunology

## 2018-04-09 ENCOUNTER — Other Ambulatory Visit: Payer: Self-pay | Admitting: Allergy & Immunology

## 2018-04-16 ENCOUNTER — Ambulatory Visit: Payer: 59 | Admitting: Physician Assistant

## 2018-04-24 ENCOUNTER — Telehealth: Payer: Self-pay | Admitting: Rheumatology

## 2018-04-24 MED ORDER — "BD TB SYRINGE 27G X 1/2"" 1 ML MISC": 1.0000 | 3 refills | Status: DC

## 2018-04-24 MED ORDER — METHOTREXATE SODIUM CHEMO INJECTION 50 MG/2ML: 20.0000 mg | INTRAMUSCULAR | 0 refills | Status: DC

## 2018-04-24 MED ORDER — FOLIC ACID 1 MG PO TABS: 1.0000 mg | ORAL_TABLET | Freq: Two times a day (BID) | ORAL | 4 refills | Status: DC

## 2018-04-24 NOTE — Telephone Encounter (Signed)
Patient called requesting prescription Methotrexate, syringes, and Folic Acid to be sent to [facility] on [address].

## 2018-04-24 NOTE — Telephone Encounter (Signed)
Last visit: 11/14/2017 Next visit:07/23/18 Labs: 02/14/18  Okay to refill per Dr. Estanislado Pandy

## 2018-04-27 ENCOUNTER — Other Ambulatory Visit: Payer: Self-pay | Admitting: Rheumatology

## 2018-04-28 NOTE — Telephone Encounter (Signed)
Last visit: 11/14/2017 Next visit: 07/23/2018 Labs: 02/14/2018 WNL  Eye exam: 01/2017 per patient.   Attempted to call patient and left message on machine to advise patient she is due for updated PLQ eye exam.   Okay to refill 30 day supply, per Dr. Estanislado Pandy.

## 2018-05-01 ENCOUNTER — Ambulatory Visit (INDEPENDENT_AMBULATORY_CARE_PROVIDER_SITE_OTHER): Payer: 59 | Admitting: *Deleted

## 2018-05-01 ENCOUNTER — Encounter: Payer: Self-pay | Admitting: Allergy and Immunology

## 2018-05-01 DIAGNOSIS — J4540 Moderate persistent asthma, uncomplicated: Secondary | ICD-10-CM

## 2018-05-02 ENCOUNTER — Telehealth: Payer: Self-pay | Admitting: Family Medicine

## 2018-05-02 MED ORDER — CEFUROXIME AXETIL 250 MG PO TABS: 250.0000 mg | ORAL_TABLET | Freq: Two times a day (BID) | ORAL | 0 refills | Status: AC

## 2018-05-02 NOTE — Telephone Encounter (Signed)
I talked with Julie [name] over the phone this afternoon. She reports symptoms including sore throat, thick nasal drainage, coughing light yellow phlegm, and sore eyes that began 10 days ago. She also reports feeing alternating hot and cold. She continues her current medications with the addition of saline nasal rinses with no improvement. Will order ceftin 250 mg twice a day for 10 days. Julie [name] verbalized agreement and will call if symptoms worsen or so not improve.

## 2018-05-06 ENCOUNTER — Telehealth: Payer: Self-pay | Admitting: Allergy and Immunology

## 2018-05-06 MED ORDER — PREDNISONE 10 MG PO TABS
10.0000 mg | ORAL_TABLET | Freq: Every day | ORAL | 0 refills | Status: AC
Start: 2018-05-06 — End: 2018-05-16

## 2018-05-06 NOTE — Telephone Encounter (Signed)
Patient was seen 04-01-18 and said she is still sick. She is having trouble breathing at night when she lays down.

## 2018-05-06 NOTE — Telephone Encounter (Signed)
Spoke to patient advised of plan. Writer sent in rx.,

## 2018-05-06 NOTE — Telephone Encounter (Signed)
Please inform patient that we can add prednisone 10 mg tablet 1 time per day for 10 days only while she continues on antibiotics.

## 2018-05-06 NOTE — Telephone Encounter (Signed)
Patient returned the call. All nurses were in rooms, I told her someone would call her back when they were free.

## 2018-05-06 NOTE — Telephone Encounter (Signed)
I called back the patient and she stated that she is still having issues with sinusitis. She still has a sore throat and nasal drainage. She did say that the mucous is breaking up and she was able to cough some up today. She also stated that she woke up this morning with a headache that caused her to call out of work. She has been having blurred vision periodically throughout the day. She does feel like her equilibrium has been off and her ears feel clogged. She is still taking her antibiotics that were prescribed last week and is still continuing her medication regimen. Will you please advise?

## 2018-05-06 NOTE — Telephone Encounter (Signed)
I called the patient back and unfortunately there was no answer and the mailbox was full. Another call will need to be made.

## 2018-05-24 ENCOUNTER — Other Ambulatory Visit: Payer: Self-pay | Admitting: Rheumatology

## 2018-05-26 NOTE — Telephone Encounter (Addendum)
Last visit: 11/14/2017 Next visit: 07/23/2018 Labs: 02/14/2018 WNL  Eye exam: 01/2017 per patient.  Patient advised we need updated PLQ eye exam. Patient states she will contact eye doctor and have them fax results.   Okay to refill 30 day supply per Dr. Estanislado Pandy

## 2018-06-11 ENCOUNTER — Ambulatory Visit (HOSPITAL_COMMUNITY)
Admission: EM | Admit: 2018-06-11 | Discharge: 2018-06-11 | Disposition: A | Discharge status: Home / Self Care | Payer: 59 | Attending: Family Medicine | Admitting: Family Medicine

## 2018-06-11 ENCOUNTER — Encounter (HOSPITAL_COMMUNITY): Payer: Self-pay | Admitting: Emergency Medicine

## 2018-06-11 DIAGNOSIS — R519 Headache, unspecified: Secondary | ICD-10-CM

## 2018-06-11 DIAGNOSIS — R51 Headache: Secondary | ICD-10-CM | POA: Diagnosis not present

## 2018-06-11 DIAGNOSIS — R112 Nausea with vomiting, unspecified: Secondary | ICD-10-CM

## 2018-06-11 MED ORDER — METOCLOPRAMIDE HCL 5 MG/ML IJ SOLN
5.0000 mg | Freq: Once | INTRAMUSCULAR | Status: AC
  Administered 2018-06-11: 5 mg via INTRAMUSCULAR

## 2018-06-11 MED ORDER — SUMATRIPTAN SUCCINATE 6 MG/0.5ML ~~LOC~~ SOLN
6.0000 mg | Freq: Once | SUBCUTANEOUS | Status: AC
  Administered 2018-06-11: 6 mg via SUBCUTANEOUS

## 2018-06-11 MED ORDER — DEXAMETHASONE SODIUM PHOSPHATE 10 MG/ML IJ SOLN
INTRAMUSCULAR | Status: AC
  Filled 2018-06-11: qty 1

## 2018-06-11 MED ORDER — ONDANSETRON 4 MG PO TBDP
4.0000 mg | ORAL_TABLET | Freq: Once | ORAL | Status: AC
  Administered 2018-06-11: 4 mg via ORAL

## 2018-06-11 MED ORDER — ONDANSETRON 4 MG PO TBDP
ORAL_TABLET | ORAL | Status: AC
  Filled 2018-06-11: qty 1

## 2018-06-11 MED ORDER — KETOROLAC TROMETHAMINE 60 MG/2ML IM SOLN
60.0000 mg | Freq: Once | INTRAMUSCULAR | Status: AC
  Administered 2018-06-11: 60 mg via INTRAMUSCULAR

## 2018-06-11 MED ORDER — DEXAMETHASONE SODIUM PHOSPHATE 10 MG/ML IJ SOLN
10.0000 mg | Freq: Once | INTRAMUSCULAR | Status: AC
  Administered 2018-06-11: 10 mg via INTRAMUSCULAR

## 2018-06-11 MED ORDER — METOCLOPRAMIDE HCL 5 MG/ML IJ SOLN
INTRAMUSCULAR | Status: AC
  Filled 2018-06-11: qty 2

## 2018-06-11 MED ORDER — KETOROLAC TROMETHAMINE 60 MG/2ML IM SOLN
INTRAMUSCULAR | Status: AC
  Filled 2018-06-11: qty 2

## 2018-06-11 MED ORDER — SUMATRIPTAN SUCCINATE 6 MG/0.5ML ~~LOC~~ SOLN
SUBCUTANEOUS | Status: AC
  Filled 2018-06-11: qty 0.5

## 2018-06-11 NOTE — ED Triage Notes (Signed)
Pt states she finihsed up an antibiotic for sinus infection but still c/o dry sinuses, pt states she woke up Sunday with a headache that hasn't gone away.

## 2018-06-11 NOTE — Discharge Instructions (Addendum)
Follow up with your primary care doctor or here within 48-72 hours. Do your best to ensure adequate rest. If not allergic, take acetaminophen (Tylenol) every 4-6 hours as needed for discomfort.  Please seek prompt medical care if: You have: A worsening headache that is not helped by medicine. Trouble walking or weakness in your arms and legs. Clear or bloody fluid coming from your nose or ears. Changes in your seeing (vision). Jerky movements that you cannot control (seizure). You throw up (vomit). Your symptoms get worse. You lose balance. Your speech is slurred. You pass out. You are sleepier and have trouble staying awake. The black centers of your eyes (pupils) change in size.  These symptoms may be an emergency. Do not wait to see if the symptoms will go away. Get medical help right away. Call your local emergency services. Do not drive yourself to the hospital.

## 2018-06-11 NOTE — ED Provider Notes (Signed)
[name]   [id number] 06/11/18 Arrival Time: 8841  ASSESSMENT & PLAN:  1. Nonintractable headache, unspecified chronicity pattern, unspecified headache type   2. Nausea and vomiting, intractability of vomiting not specified, unspecified vomiting type     Meds ordered this encounter  Medications  . ketorolac (TORADOL) injection 60 mg  . metoCLOPramide (REGLAN) injection 5 mg  . dexamethasone (DECADRON) injection 10 mg  . SUMAtriptan (IMITREX) injection 6 mg  . ondansetron (ZOFRAN-ODT) disintegrating tablet 4 mg   ED if not improving. May f/u here as needed or with her PCP. Ensure adequate fluid intake and rest. Reviewed expectations re: course of current medical issues. Questions answered. Outlined signs and symptoms indicating need for more acute intervention. Patient verbalized understanding. After Visit Summary given.   SUBJECTIVE:  Julie [name] is a 52 y.o. female who presents with complaint of a headache. H/O similar. Reports gradual onset several days ago. Location: frontal without radiation. Precipitating factors include: none which have been determined. Associated symptoms: Preceding aura: no. Nausea/vomiting: yes, mild without emesis. Vision changes: no. Increased sensitivity to light and to noises: yes. Fever: no. Sinus pressure/congestion: yes. Extremity weakness: none reported. Home treatment has included acetaminophen with little improvement. Current headache does not limit normal daily activities. The patient denies depression, dizziness, loss of balance, numbness of extremities, speech difficulties and vision problems.  ROS: As per HPI.   OBJECTIVE:  Vitals:   06/11/18 1419  BP: (!) 137/91  Pulse: 82  Resp: 16  Temp: 98.1 F (36.7 C)  SpO2: 100%    General appearance: alert; no distress Eyes: PERRLA; EOMI; conjunctiva normal HENT: normocephalic; atraumatic; mild nasal congestion; mild frontal sinus tenderness Neck: supple  with FROM Lungs: clear to auscultation bilaterally Heart: regular rate and rhythm Extremities: no edema; symmetrical with no gross deformities Skin: warm and dry Neurologic: CN 2-12 grossly intact; normal gait; normal symmetric reflexes; normal extremity strength and sensation throughout Psychological: alert and cooperative; normal mood and affect   Allergies  Allergen Reactions  . Aspirin Other (See Comments)    pt couldn't take while on methotrexate, but is no longer taking  . Augmentin [Amoxicillin-Pot Clavulanate] Other (See Comments)    G I upset    Past Medical History:  Diagnosis Date  . Asthma   . Complication of anesthesia   . Eczema   . PONV (postoperative nausea and vomiting)   . Rheumatoid arthritis ([place])    Social History   Socioeconomic History  . Marital status: Married    Spouse name: Not on file  . Number of children: Not on file  . Years of education: Not on file  . Highest education level: Not on file  Occupational History  . Not on file  Social Needs  . Financial resource strain: Not on file  . Food insecurity:    Worry: Not on file    Inability: Not on file  . Transportation needs:    Medical: Not on file    Non-medical: Not on file  Tobacco Use  . Smoking status: Never Smoker  . Smokeless tobacco: Never Used  Substance and Sexual Activity  . Alcohol use: No  . Drug use: No  . Sexual activity: Not on file  Lifestyle  . Physical activity:    Days per week: Not on file    Minutes per session: Not on file  . Stress: Not on file  Relationships  . Social connections:    Talks on phone: Not on file  Gets together: Not on file    Attends religious service: Not on file    Active member of club or organization: Not on file    Attends meetings of clubs or organizations: Not on file    Relationship status: Not on file  . Intimate partner violence:    Fear of current or ex partner: Not on file    Emotionally abused: Not on file     Physically abused: Not on file    Forced sexual activity: Not on file  Other Topics Concern  . Not on file  Social History Narrative  . Not on file   Family History  Problem Relation Age of Onset  . Asthma Mother   . Eczema Mother   . Sarcoidosis Mother   . Hypertension Mother   . Cancer Father   . Hypotension Father   . Asthma Sister   . GER disease Sister   . Allergic rhinitis Neg Hx   . Angioedema Neg Hx   . Immunodeficiency Neg Hx   . Urticaria Neg Hx    Past Surgical History:  Procedure Laterality Date  . ABDOMINAL HYSTERECTOMY     partial age 41  . BILATERAL SALPINGECTOMY Bilateral 05/25/2015   Procedure: BILATERAL SALPINGECTOMY;  Surgeon: Servando Salina, MD;  Location: [facility];  Service: Gynecology;  Laterality: Bilateral;  . OVARIAN CYST REMOVAL Right 05/25/2015   Procedure: OVARIAN CYSTECTOMY;  Surgeon: Servando Salina, MD;  Location: [facility];  Service: Gynecology;  Laterality: Right;  . TONSILLECTOMY    . TUBAL LIGATION       Vanessa Kick, MD 06/19/18 [phone]

## 2018-06-18 ENCOUNTER — Ambulatory Visit: Payer: 59 | Admitting: Allergy & Immunology

## 2018-06-18 ENCOUNTER — Encounter: Payer: Self-pay | Admitting: Allergy & Immunology

## 2018-06-18 VITALS — BP 112/76 | HR 97 | Resp 16 | Ht 61.25 in | Wt 195.0 lb

## 2018-06-18 DIAGNOSIS — J3089 Other allergic rhinitis: Secondary | ICD-10-CM

## 2018-06-18 DIAGNOSIS — J320 Chronic maxillary sinusitis: Secondary | ICD-10-CM | POA: Diagnosis not present

## 2018-06-18 DIAGNOSIS — J4540 Moderate persistent asthma, uncomplicated: Secondary | ICD-10-CM | POA: Diagnosis not present

## 2018-06-18 MED ORDER — AMOXICILLIN-POT CLAVULANATE 875-125 MG PO TABS: 1.0000 | ORAL_TABLET | Freq: Two times a day (BID) | ORAL | 0 refills | Status: AC

## 2018-06-18 NOTE — Progress Notes (Signed)
FOLLOW UP  Date of Service/Encounter:  06/18/18   Assessment:   Chronic maxillary sinusitis  Moderate persistent asthma without complication  Allergic rhinitis   History of migraines    Julie [name] presents for a sick visit.  She has had nasal congestion and sinus pressure for approximately 2 to 3 weeks.  She does report yellow mucus, although I did tell her that colored mucus really does not correlate with the presence of a bacterial or viral infection.  In any case, her symptoms are at the time course where it would be reasonable to treat for presumed sinusitis.  We are going to treat with Augmentin for 1 week.  She has no improvement with this, we we will either need to change antibiotics or get a sinus CT.  We will not change her asthma medication since she is doing fairly well.  Patient is in agreement with the plan.     Plan/Recommendations:   1. Moderate persistent asthma without complication - Lung testing actually looks quite good today.  - Daily controller medication(s): Qvar 6mcg Redihaler 2 puffs twice daily + Xolair 300mg  monthly - Prior to physical activity: ProAir 2 puffs 10-15 minutes before physical activity - Rescue medications: ProAir 4 puffs every 4-6 hours as needed or albuterol nebulizer one vial puffs every 4-6 hours as needed - Asthma control goals:  * Full participation in all desired activities (may need albuterol before activity) * Albuterol use two time or less a week on average (not counting use with activity) * Cough interfering with sleep two time or less a month * Oral steroids no more than once a year * No hospitalizations  2. LPRD (laryngopharyngeal reflux disease) - Continue with Dexilant 60mg  daily.  - Continue   3. Other seasonal allergic rhinitis - Continue with levocetirizine (Xyzal) 5mg  daily.  4. Sinusitis - With your current symptoms and time course, antibiotics are needed: Augmentin 875mg  twice daily for 7 days - Add on  nasal saline spray (i.e., Simply Saline) or nasal saline lavage (i.e., NeilMed) as needed prior to medicated nasal sprays. - For thick post nasal drainage, add guaifenesin [phone] mg (Mucinex) twice daily as needed for mucous thinning with adequate hydration to help it work.   5. Return in about 3 months (around 09/18/2018).   Subjective:   Julie [name] is a 52 y.o. female presenting today for follow up of  Chief Complaint  Patient presents with  . Sinus Problem  . Asthma    Julie [name] has a history of the following: Patient Active Problem List   Diagnosis Date Noted  . Fibromyalgia 02/14/2018  . History of gastroesophageal reflux (GERD) 02/14/2018  . History of asthma 02/14/2018  . Rheumatoid arthritis involving multiple sites with positive rheumatoid factor ([place]) 11/05/2016  . High risk medication use 11/05/2016  . LPRD (laryngopharyngeal reflux disease) 09/20/2015  . Mild persistent asthma 09/20/2015  . Allergic rhinoconjunctivitis 09/20/2015  . Acute sinusitis 07/29/2015  . Asthma with acute exacerbation 07/29/2015  . S/P total hysterectomy 05/25/2015    History obtained from: chart review and patient.  Julie [name]'s Primary Care Provider is Starkes, Gayland Curry, FNP.     Julie is a 52 y.o. female presenting for a sick visit.  She was last seen in July 2019 by Dr. Neldon Mc.  At that time, she was continued on Xolair and Qvar 80 mcg 2 puffs twice daily.  She was also continued on montelukast as well as her Dexilant and ranitidine  for her GERD.  Since the last visit, she has mostly done well. She reports that she has developed copious mucous production and epistaxis. Symptoms have been ongoing for a period of 2-3 weeks. She actually was treated at the end of August with an antibiotic (ceftin) as well as a prednisone taper. She does report that she felt better after this. However, the weather became cooler and she developed a migraine. She went to the Urgent Care and  received a cocktail for her migraine. Since then she has continued to have problems with nasal sinus discharge and sinus pain and pressure. She reports that she became concerned "when [her] mucous turned yellow".   Now her mucous remains discolored and bloody and it is worse than when she was treated in August. She does endorse some sinus pressure. During this time, her breathing has remained excellent. She reminds on her Qvar twice daily. She was taken off of the nasal spray since it was drying her out.   Otherwise, there have been no changes to her past medical history, surgical history, family history, or social history.    Review of Systems: a 14-point review of systems is pertinent for what is mentioned in HPI.  Otherwise, all other systems were negative. Constitutional: negative other than that listed in the HPI Eyes: negative other than that listed in the HPI Ears, nose, mouth, throat, and face: negative other than that listed in the HPI Respiratory: negative other than that listed in the HPI Cardiovascular: negative other than that listed in the HPI Gastrointestinal: negative other than that listed in the HPI Genitourinary: negative other than that listed in the HPI Integument: negative other than that listed in the HPI Hematologic: negative other than that listed in the HPI Musculoskeletal: negative other than that listed in the HPI Neurological: negative other than that listed in the HPI Allergy/Immunologic: negative other than that listed in the HPI    Objective:   Blood pressure 112/76, pulse 97, resp. rate 16, height 5' 1.25" (1.556 m), weight 195 lb (88.5 kg), last menstrual period 05/19/2015, SpO2 98 %. Body mass index is 36.54 kg/m.   Physical Exam:  General: Alert, interactive, in no acute distress. Pleasant.  Eyes: No conjunctival injection bilaterally, no discharge on the right, no discharge on the left and no Horner-Trantas dots present. PERRL bilaterally. EOMI  without pain. No photophobia.  Ears: Right TM pearly gray with normal light reflex, Left TM pearly gray with normal light reflex, Right TM intact without perforation and Left TM intact without perforation.  Nose/Throat: External nose within normal limits, nasal crease present and septum midline. Turbinates edematous with clear discharge. Posterior oropharynx mildly erythematous without cobblestoning in the posterior oropharynx. Tonsils 2+ without exudates.  Tongue without thrush. Lungs: Clear to auscultation without wheezing, rhonchi or rales. No increased work of breathing. CV: Normal S1/S2. No murmurs. Capillary refill <2 seconds.  Skin: Warm and dry, without lesions or rashes. Neuro:   Grossly intact. No focal deficits appreciated. Responsive to questions.  Diagnostic studies:   Spirometry: results normal (FEV1: 1.80/86%, FVC: 2.22/86%, FEV1/FVC: 81%).    Spirometry consistent with normal pattern.   Allergy Studies: none      Salvatore Marvel, MD  [facility]

## 2018-06-18 NOTE — Patient Instructions (Addendum)
1. Moderate persistent asthma without complication - Lung testing actually looks quite good today.  - Daily controller medication(s): Qvar 77mcg Redihaler 2 puffs twice daily + Xolair 300mg  monthly - Prior to physical activity: ProAir 2 puffs 10-15 minutes before physical activity - Rescue medications: ProAir 4 puffs every 4-6 hours as needed or albuterol nebulizer one vial puffs every 4-6 hours as needed - Asthma control goals:  * Full participation in all desired activities (may need albuterol before activity) * Albuterol use two time or less a week on average (not counting use with activity) * Cough interfering with sleep two time or less a month * Oral steroids no more than once a year * No hospitalizations  2. LPRD (laryngopharyngeal reflux disease) - Continue with Dexilant 60mg  daily.  - Continue   3. Other seasonal allergic rhinitis - Continue with levocetirizine (Xyzal) 5mg  daily.  4. Sinusitis - With your current symptoms and time course, antibiotics are needed: Augmentin 875mg  twice daily for 7 days - Add on nasal saline spray (i.e., Simply Saline) or nasal saline lavage (i.e., NeilMed) as needed prior to medicated nasal sprays. - For thick post nasal drainage, add guaifenesin [phone] mg (Mucinex) twice daily as needed for mucous thinning with adequate hydration to help it work.   5. Return in about 3 months (around 09/18/2018).   Please inform us of any Emergency Department visits, hospitalizations, or changes in symptoms. Call us before going to the ED for breathing or allergy symptoms since we might be able to fit you in for a sick visit. Feel free to contact us anytime with any questions, problems, or concerns.  It was a pleasure to see you again today!   Websites that have reliable patient information: 1. American Academy of Asthma, Allergy, and Immunology: www.aaaai.org 2. Food Allergy Research and Education (FARE): foodallergy.org 3. Mothers of Asthmatics:  http://www.asthmacommunitynetwork.org 4. American College of Allergy, Asthma, and Immunology: www.acaai.org

## 2018-06-27 ENCOUNTER — Other Ambulatory Visit: Payer: Self-pay | Admitting: *Deleted

## 2018-06-27 ENCOUNTER — Other Ambulatory Visit: Payer: Self-pay | Admitting: Rheumatology

## 2018-06-27 DIAGNOSIS — Z79899 Other long term (current) drug therapy: Secondary | ICD-10-CM

## 2018-06-27 LAB — COMPLETE METABOLIC PANEL WITH GFR
AG Ratio: 1.4 (calc) (ref 1.0–2.5)
ALT: 15 U/L (ref 6–29)
AST: 16 U/L (ref 10–35)
Albumin: 4 g/dL (ref 3.6–5.1)
Alkaline phosphatase (APISO): 73 U/L (ref 33–115)
BUN: 7 mg/dL (ref 7–25)
CO2: 29 mmol/L (ref 20–32)
Calcium: 9.1 mg/dL (ref 8.6–10.2)
Chloride: 103 mmol/L (ref 98–110)
Creat: 0.65 mg/dL (ref 0.50–1.10)
GFR, Est African American: 121 mL/min/{1.73_m2} (ref >=60)
GFR, Est Non African American: 104 mL/min/{1.73_m2} (ref >=60)
Globulin: 2.8 g/dL (calc) (ref 1.9–3.7)
Glucose, Bld: 105 mg/dL — ABNORMAL HIGH (ref 65–99)
Potassium: 4.4 mmol/L (ref 3.5–5.3)
Sodium: 139 mmol/L (ref 135–146)
Total Bilirubin: 0.3 mg/dL (ref 0.2–1.2)
Total Protein: 6.8 g/dL (ref 6.1–8.1)

## 2018-06-27 LAB — CBC WITH DIFFERENTIAL/PLATELET
Basophils Absolute: 52 cells/uL (ref 0–200)
Basophils Relative: 1.2 %
Eosinophils Absolute: 151 cells/uL (ref 15–500)
Eosinophils Relative: 3.5 %
HCT: 39 % (ref 35.0–45.0)
Hemoglobin: 13 g/dL (ref 11.7–15.5)
Lymphs Abs: 2008 cells/uL (ref 850–3900)
MCH: 27.4 pg (ref 27.0–33.0)
MCHC: 33.3 g/dL (ref 32.0–36.0)
MCV: 82.3 fL (ref 80.0–100.0)
MPV: 11.6 fL (ref 7.5–12.5)
Monocytes Relative: 8.2 %
Neutro Abs: 1737 cells/uL (ref 1500–7800)
Neutrophils Relative %: 40.4 %
Platelets: 233 10*3/uL (ref 140–400)
RBC: 4.74 10*6/uL (ref 3.80–5.10)
RDW: 11.8 % (ref 11.0–15.0)
Total Lymphocyte: 46.7 %
WBC mixed population: 353 cells/uL (ref 200–950)
WBC: 4.3 10*3/uL (ref 3.8–10.8)

## 2018-06-27 NOTE — Telephone Encounter (Signed)
She needs to return for lab work prior to refill.

## 2018-06-27 NOTE — Telephone Encounter (Addendum)
Last visit: 11/14/2017 Next visit: 07/23/2018 Labs:02/14/2018 WNL   Left message for patient to advise she is due to update labs.   Okay to refill 30 day supply MTX?

## 2018-06-28 ENCOUNTER — Other Ambulatory Visit: Payer: Self-pay | Admitting: Rheumatology

## 2018-06-30 NOTE — Telephone Encounter (Signed)
Last visit: 11/14/2017 Next visit: 07/23/2018 Labs: 06/27/18 Glucose is 105. All other labs are WNL. PLQ Eye Exam: 02/12/18 WNL   Okay to refill per Dr. Estanislado Pandy

## 2018-07-02 ENCOUNTER — Telehealth: Payer: Self-pay | Admitting: Rheumatology

## 2018-07-02 NOTE — Telephone Encounter (Signed)
Patient called stating that she forgot to notify the office that she had been given antibiotics and a shot of Prednisone from her PCP the week before she had her labs done on 06/27/18.  Patient states that might be the reason for her increased Glucose level.

## 2018-07-03 NOTE — Telephone Encounter (Signed)
Reviewed

## 2018-07-14 NOTE — Progress Notes (Signed)
Office Visit Note  Patient: Julie [name]             Date of Birth: December 17, 1965           MRN: [id number]             PCP: Suella Broad, FNP Referring: Suella Broad,* Visit Date: 07/23/2018 Occupation: @GUAROCC @  Subjective:  Bilateral hand pain   History of Present Illness: Julie [name] is a 52 y.o. female with history of seropositive rheumatoid arthritis and fibromyalgia.  She is on MTX 0.8 ml once weekly, folic acid 2 mg po daily, and PLQ 200 mg by mouth BID.  She has not missed any doses of medications at this time. She reports she is currently flaring.  She has been renovating the floors in her home and overusing her hands. She is having pain and swelling in both wrist joints, worse in the left wrist joint. She reports she is also having muscle soreness from being more active.   Activities of Daily Living:  Patient reports morning stiffness for0  minutes.   Patient Denies nocturnal pain.  Difficulty dressing/grooming: Denies Difficulty climbing stairs: Denies Difficulty getting out of chair: Denies Difficulty using hands for taps, buttons, cutlery, and/or writing: Denies  Review of Systems  Constitutional: Negative for fatigue.  HENT: Negative for mouth sores, mouth dryness and nose dryness.   Eyes: Negative for pain, visual disturbance and dryness.  Respiratory: Negative for cough, hemoptysis, shortness of breath and difficulty breathing.   Cardiovascular: Negative for chest pain, palpitations, hypertension and swelling in legs/feet.  Gastrointestinal: Negative for blood in stool, constipation and diarrhea.  Endocrine: Negative for increased urination.  Genitourinary: Negative for painful urination.  Musculoskeletal: Positive for arthralgias, joint pain, joint swelling, myalgias, muscle tenderness and myalgias. Negative for muscle weakness and morning stiffness.  Skin: Negative for color change, pallor, rash, hair loss, nodules/bumps, skin  tightness, ulcers and sensitivity to sunlight.  Allergic/Immunologic: Negative for susceptible to infections.  Neurological: Negative for dizziness, numbness, headaches and weakness.  Hematological: Negative for swollen glands.  Psychiatric/Behavioral: Negative for depressed mood and sleep disturbance. The patient is not nervous/anxious.     PMFS History:  Patient Active Problem List   Diagnosis Date Noted  . Fibromyalgia 02/14/2018  . History of gastroesophageal reflux (GERD) 02/14/2018  . History of asthma 02/14/2018  . Rheumatoid arthritis involving multiple sites with positive rheumatoid factor ([place]) 11/05/2016  . High risk medication use 11/05/2016  . LPRD (laryngopharyngeal reflux disease) 09/20/2015  . Mild persistent asthma 09/20/2015  . Allergic rhinoconjunctivitis 09/20/2015  . Acute sinusitis 07/29/2015  . Asthma with acute exacerbation 07/29/2015  . S/P total hysterectomy 05/25/2015    Past Medical History:  Diagnosis Date  . Asthma   . Complication of anesthesia   . Eczema   . PONV (postoperative nausea and vomiting)   . Rheumatoid arthritis ([place])     Family History  Problem Relation Age of Onset  . Asthma Mother   . Eczema Mother   . Sarcoidosis Mother   . Hypertension Mother   . Cancer Father   . Hypotension Father   . Asthma Sister   . GER disease Sister   . Allergic rhinitis Neg Hx   . Angioedema Neg Hx   . Immunodeficiency Neg Hx   . Urticaria Neg Hx    Past Surgical History:  Procedure Laterality Date  . ABDOMINAL HYSTERECTOMY     partial age 72  . BILATERAL SALPINGECTOMY Bilateral 05/25/2015  Procedure: BILATERAL SALPINGECTOMY;  Surgeon: Servando Salina, MD;  Location: [facility];  Service: Gynecology;  Laterality: Bilateral;  . OVARIAN CYST REMOVAL Right 05/25/2015   Procedure: OVARIAN CYSTECTOMY;  Surgeon: Servando Salina, MD;  Location: [facility];  Service: Gynecology;  Laterality: Right;  . TONSILLECTOMY    . TUBAL LIGATION     Social  History   Social History Narrative  . Not on file    Objective: Vital Signs: BP 138/81 (BP Location: Left Arm, Patient Position: Sitting, Cuff Size: Normal)   Pulse 78   Resp 14   Ht 5' 1.25" (1.556 m)   Wt 196 lb 12.8 oz (89.3 kg)   LMP 05/19/2015   BMI 36.88 kg/m    Physical Exam  Constitutional: She is oriented to person, place, and time. She appears well-developed and well-nourished.  HENT:  Head: Normocephalic and atraumatic.  Eyes: Conjunctivae and EOM are normal.  Neck: Normal range of motion.  Cardiovascular: Normal rate, regular rhythm, normal heart sounds and intact distal pulses.  Pulmonary/Chest: Effort normal and breath sounds normal.  Abdominal: Soft. Bowel sounds are normal.  Lymphadenopathy:    She has no cervical adenopathy.  Neurological: She is alert and oriented to person, place, and time.  Skin: Skin is warm and dry. Capillary refill takes less than 2 seconds.  Psychiatric: She has a normal mood and affect. Her behavior is normal.  Nursing note and vitals reviewed.    Musculoskeletal Exam: C-spine, thoracic spine, and lumbar spine good ROM.  No midline spinal tenderness.  No SI joint tenderness.  Shoulder joints and elbow joints good ROM with no synovitis.  Nodule present on right elbow. Right wrist full ROM with no discomfort. Left wrist limited ROM. Synovitis of bilateral wrist joints. Right 2nd MCP synovitis. Hip joints, knee joints, ankle joints, MTPs, PIPs, and DIPs good ROM with no synovitis.  No warmth or effusion of knee joints.  No tenderness or swelling of ankle joints.  No tenderness of trochanteric bursa bilaterally.   CDAI Exam: CDAI Score: Not documented Patient Global Assessment: Not documented; Provider Global Assessment: Not documented Swollen: 3 ; Tender: 3  Joint Exam      Right  Left  Wrist  Swollen Tender  Swollen Tender  MCP 2  Swollen Tender        Investigation: No additional findings.  Imaging: No results  found.  Recent Labs: Lab Results  Component Value Date   WBC 4.3 06/27/2018   HGB 13.0 06/27/2018   PLT 233 06/27/2018   NA 139 06/27/2018   K 4.4 06/27/2018   CL 103 06/27/2018   CO2 29 06/27/2018   GLUCOSE 105 (H) 06/27/2018   BUN 7 06/27/2018   CREATININE 0.65 06/27/2018   BILITOT 0.3 06/27/2018   ALKPHOS 55 03/26/2017   AST 16 06/27/2018   ALT 15 06/27/2018   PROT 6.8 06/27/2018   ALBUMIN 3.7 03/26/2017   CALCIUM 9.1 06/27/2018   GFRAA 121 06/27/2018    Speciality Comments: PLQ Eye Exam: 02/12/18 WNL at [facility] Follow up in 1 year  Procedures:  No procedures performed Allergies: Aspirin and Augmentin [amoxicillin-pot clavulanate]   Assessment / Plan:     Visit Diagnoses: Rheumatoid arthritis involving multiple sites with positive rheumatoid factor ([place]) -  +RF, +CCP: She has synovitis of bilateral wrist joints and right 2nd MCP joint.  She has been overusing her hands renovating the floors in her home, which has caused her to flare.  She continues to inject MTX  0.8 ml sq once weekly, takes folic acid 2 mg by mouth daily, and plaquenil 200 mg 1 tablet by mouth BID M-F. She does not need any refills at this time.  She has intermittent joint pain and joint swelling.  She is hesitant to start on a biologic medication at this time.  She will increase MTX to 1.0 ml sq once weekly and increase Plaquenil to 200 mg 1 tablet by mouth BID.  She will return for lab work in 2 weeks, then 2 months, then every 3 months. She reports when she takes prednisone it increases her blood glucose, so we will not send in a taper at this time.  She will follow up in the office in 3 months.  She will notify us if she continues to have intermittent joint pain and joint swelling.    High risk medication use -  MTX 0.8 ML subcutaneous every week, folic acid 2 mg by mouth daily, PLQ 200 mg by mouth twice a day Monday through Friday.Last PLQ eye exam WNL 02/12/18. CBC and CMP were WNL on 06/27/18.    Fibromyalgia: She has generalized muscle aches and muscle tenderness.  She has been renovating the floors in her home, which has caused increased muscle soreness and fatigue.   Trigger middle finger of right hand: Resolved   Other medical conditions are listed as follows:   History of gastroesophageal reflux (GERD)  History of asthma  History of depression   Orders: No orders of the defined types were placed in this encounter.  No orders of the defined types were placed in this encounter.   Face-to-face time spent with patient was 30 minutes. Greater than 50% of time was spent in counseling and coordination of care.  Follow-Up Instructions: Return in about 3 months (around 10/23/2018) for Rheumatoid arthritis, Fibromyalgia.   Ofilia Neas, PA-C  Note - This record has been created using Dragon software.  Chart creation errors have been sought, but may not always  have been located. Such creation errors do not reflect on  the standard of medical care.

## 2018-07-23 ENCOUNTER — Encounter: Payer: Self-pay | Admitting: Physician Assistant

## 2018-07-23 ENCOUNTER — Ambulatory Visit (INDEPENDENT_AMBULATORY_CARE_PROVIDER_SITE_OTHER): Payer: 59 | Admitting: Physician Assistant

## 2018-07-23 VITALS — BP 138/81 | HR 78 | Resp 14 | Ht 61.25 in | Wt 196.8 lb

## 2018-07-23 DIAGNOSIS — M797 Fibromyalgia: Secondary | ICD-10-CM | POA: Diagnosis not present

## 2018-07-23 DIAGNOSIS — M0579 Rheumatoid arthritis with rheumatoid factor of multiple sites without organ or systems involvement: Secondary | ICD-10-CM | POA: Diagnosis not present

## 2018-07-23 DIAGNOSIS — M65331 Trigger finger, right middle finger: Secondary | ICD-10-CM | POA: Diagnosis not present

## 2018-07-23 DIAGNOSIS — Z8709 Personal history of other diseases of the respiratory system: Secondary | ICD-10-CM

## 2018-07-23 DIAGNOSIS — Z79899 Other long term (current) drug therapy: Secondary | ICD-10-CM | POA: Diagnosis not present

## 2018-07-23 DIAGNOSIS — Z8659 Personal history of other mental and behavioral disorders: Secondary | ICD-10-CM

## 2018-07-23 DIAGNOSIS — Z8719 Personal history of other diseases of the digestive system: Secondary | ICD-10-CM

## 2018-07-23 NOTE — Patient Instructions (Addendum)
Methotrexate 1.0 ml subcutaneously once weekly Plaquenil 200 mg 1 tablet by mouth  In 2 weeks return for lab work    [facility] We placed an order today for your standing lab work.    Please come back and get your standing labs in 2 weeks, 2 months, and then every 3 months   We have open lab Monday through Friday from 8:30-11:30 AM and 1:30-4:00 PM  at the office of Dr. Bo Merino.   You may experience shorter wait times on Monday and Friday afternoons. The office is located at [address] [place], [place], [place] [address] No appointment is necessary.   Labs are drawn by [facility].  You may receive a bill from [facility] for your lab work. If you have any questions regarding directions or hours of operation,  please call [phone].   Just as a reminder please drink plenty of water prior to coming for your lab work. Thanks!

## 2018-08-11 ENCOUNTER — Other Ambulatory Visit: Payer: Self-pay

## 2018-08-11 DIAGNOSIS — Z79899 Other long term (current) drug therapy: Secondary | ICD-10-CM

## 2018-08-12 LAB — CBC WITH DIFFERENTIAL/PLATELET
Basophils Absolute: 51 cells/uL (ref 0–200)
Basophils Relative: 1.1 %
Eosinophils Absolute: 221 cells/uL (ref 15–500)
Eosinophils Relative: 4.8 %
HCT: 37.1 % (ref 35.0–45.0)
Hemoglobin: 12.3 g/dL (ref 11.7–15.5)
Lymphs Abs: 1877 cells/uL (ref 850–3900)
MCH: 27.5 pg (ref 27.0–33.0)
MCHC: 33.2 g/dL (ref 32.0–36.0)
MCV: 83 fL (ref 80.0–100.0)
MPV: 11.6 fL (ref 7.5–12.5)
Monocytes Relative: 9.4 %
Neutro Abs: 2019 cells/uL (ref 1500–7800)
Neutrophils Relative %: 43.9 %
Platelets: 230 10*3/uL (ref 140–400)
RBC: 4.47 10*6/uL (ref 3.80–5.10)
RDW: 12.2 % (ref 11.0–15.0)
Total Lymphocyte: 40.8 %
WBC mixed population: 432 cells/uL (ref 200–950)
WBC: 4.6 10*3/uL (ref 3.8–10.8)

## 2018-08-12 LAB — COMPLETE METABOLIC PANEL WITH GFR
AG Ratio: 1.6 (calc) (ref 1.0–2.5)
ALT: 17 U/L (ref 6–29)
AST: 21 U/L (ref 10–35)
Albumin: 4.2 g/dL (ref 3.6–5.1)
Alkaline phosphatase (APISO): 71 U/L (ref 33–130)
BUN: 9 mg/dL (ref 7–25)
CO2: 28 mmol/L (ref 20–32)
Calcium: 9.3 mg/dL (ref 8.6–10.4)
Chloride: 101 mmol/L (ref 98–110)
Creat: 0.73 mg/dL (ref 0.50–1.05)
GFR, Est African American: 111 mL/min/{1.73_m2} (ref >=60)
GFR, Est Non African American: 96 mL/min/{1.73_m2} (ref >=60)
Globulin: 2.7 g/dL (calc) (ref 1.9–3.7)
Glucose, Bld: 108 mg/dL — ABNORMAL HIGH (ref 65–99)
Potassium: 4.5 mmol/L (ref 3.5–5.3)
Sodium: 138 mmol/L (ref 135–146)
Total Bilirubin: 0.4 mg/dL (ref 0.2–1.2)
Total Protein: 6.9 g/dL (ref 6.1–8.1)

## 2018-08-26 ENCOUNTER — Ambulatory Visit: Payer: 59 | Admitting: Allergy and Immunology

## 2018-09-21 ENCOUNTER — Other Ambulatory Visit: Payer: Self-pay | Admitting: Rheumatology

## 2018-09-22 NOTE — Telephone Encounter (Signed)
Last visit: 07/23/18 Next Visit: 10/24/18 Labs: 08/11/18 Glucose is 108. All other labs are WNL. PLQ Eye Exam: 02/12/18 WNL   Okay to refill per Dr. Estanislado Pandy

## 2018-09-23 ENCOUNTER — Encounter: Payer: Self-pay | Admitting: Allergy & Immunology

## 2018-09-23 ENCOUNTER — Ambulatory Visit: Payer: 59 | Admitting: Allergy & Immunology

## 2018-09-23 VITALS — BP 117/78 | HR 78 | Resp 14 | Ht 61.0 in

## 2018-09-23 DIAGNOSIS — J4540 Moderate persistent asthma, uncomplicated: Secondary | ICD-10-CM

## 2018-09-23 DIAGNOSIS — J3089 Other allergic rhinitis: Secondary | ICD-10-CM | POA: Diagnosis not present

## 2018-09-23 DIAGNOSIS — K219 Gastro-esophageal reflux disease without esophagitis: Secondary | ICD-10-CM | POA: Diagnosis not present

## 2018-09-23 MED ORDER — EPINEPHRINE 0.3 MG/0.3ML IJ SOAJ: INTRAMUSCULAR | 2 refills | Status: DC

## 2018-09-23 MED ORDER — BECLOMETHASONE DIPROP HFA 80 MCG/ACT IN AERB: 2.0000 | INHALATION_SPRAY | Freq: Two times a day (BID) | RESPIRATORY_TRACT | 5 refills | Status: DC

## 2018-09-23 MED ORDER — DEXLANSOPRAZOLE 60 MG PO CPDR: DELAYED_RELEASE_CAPSULE | ORAL | 5 refills | Status: DC

## 2018-09-23 MED ORDER — RANITIDINE HCL 300 MG PO TABS: ORAL_TABLET | ORAL | 5 refills | Status: DC

## 2018-09-23 MED ORDER — ALBUTEROL SULFATE HFA 108 (90 BASE) MCG/ACT IN AERS: INHALATION_SPRAY | RESPIRATORY_TRACT | 2 refills | Status: DC

## 2018-09-23 NOTE — Patient Instructions (Addendum)
1. Moderate persistent asthma without complication - Lung testing actually looks quite good today.  - We are going to change you from Xolair to [place] today since [place] can help with nasal symptoms as well.  - Daily controller medication(s): Qvar 78mcg Redihaler 2 puffs twice daily + Dupixent every two weeks - Prior to physical activity: ProAir 2 puffs 10-15 minutes before physical activity - Rescue medications: ProAir 4 puffs every 4-6 hours as needed or albuterol nebulizer one vial puffs every 4-6 hours as needed - Asthma control goals:  * Full participation in all desired activities (may need albuterol before activity) * Albuterol use two time or less a week on average (not counting use with activity) * Cough interfering with sleep two time or less a month * Oral steroids no more than once a year * No hospitalizations  2. LPRD (laryngopharyngeal reflux disease) - Continue with Dexilant 60mg  daily.  - Continue with ranitidine at night.   3. Seasonal allergic rhinitis - Continue with levocetirizine (Xyzal) 5mg  daily. - Continue with Ayr nasal saline gel.  - Hopefully the addition of the [place] will help with the rhinitis symptoms and the asthma symptoms.   4. Return in about 3 months (around 12/23/2018).   Please inform us of any Emergency Department visits, hospitalizations, or changes in symptoms. Call us before going to the ED for breathing or allergy symptoms since we might be able to fit you in for a sick visit. Feel free to contact us anytime with any questions, problems, or concerns.  It was a pleasure to see you again today!   Websites that have reliable patient information: 1. American Academy of Asthma, Allergy, and Immunology: www.aaaai.org 2. Food Allergy Research and Education (FARE): foodallergy.org 3. Mothers of Asthmatics: http://www.asthmacommunitynetwork.org 4. American College of Allergy, Asthma, and Immunology: www.acaai.org

## 2018-09-23 NOTE — Progress Notes (Signed)
FOLLOW UP  Date of Service/Encounter:  09/23/18   Assessment:   Chronic rhinosinusitis  Moderate persistent asthma without complication  Rheumatoid arthritis - on MTX, folic acid, and Plaquenil  History of migraines    Julie [name] is a 53 year old female who is well-known to this practice.  She has a history of moderate persistent asthma as well as chronic rhinitis.  She has been off her Xolair with worsening of her asthma symptoms.  She is only ever been on Xolair, which has done a good job of controlling her symptoms.  However, I think it might be worth it to try dupilumab instead given her history of chronic rhinitis.  While she has never had nasal polyps per se, I think it still might be a useful endeavor to change her biologic.  She did have an absolute eosinophil count of 221 in November 2019, which should qualify her for [place].  We did provide her with a sample today and we will submit for approval based on an asthma indication.  Plan/Recommendations:   1. Moderate persistent asthma without complication - Lung testing actually looks quite good today.  - We are going to change you from Xolair to [place] today since [place] can help with nasal symptoms as well.  - Daily controller medication(s): Qvar 59mcg Redihaler 2 puffs twice daily + Dupixent every two weeks - Prior to physical activity: ProAir 2 puffs 10-15 minutes before physical activity - Rescue medications: ProAir 4 puffs every 4-6 hours as needed or albuterol nebulizer one vial puffs every 4-6 hours as needed - Asthma control goals:  * Full participation in all desired activities (may need albuterol before activity) * Albuterol use two time or less a week on average (not counting use with activity) * Cough interfering with sleep two time or less a month * Oral steroids no more than once a year * No hospitalizations  2. LPRD (laryngopharyngeal reflux disease) - Continue with Dexilant 60mg  daily.  -  Continue with ranitidine at night.   3. Seasonal allergic rhinitis - Continue with levocetirizine (Xyzal) 5mg  daily. - Continue with Ayr nasal saline gel.  - Hopefully the addition of the [place] will help with the rhinitis symptoms and the asthma symptoms.   4. Return in about 3 months (around 12/23/2018).  Subjective:   Julie [name] is a 53 y.o. female presenting today for follow up of No chief complaint on file.   Julie [name] has a history of the following: Patient Active Problem List   Diagnosis Date Noted  . Fibromyalgia 02/14/2018  . History of gastroesophageal reflux (GERD) 02/14/2018  . History of asthma 02/14/2018  . Rheumatoid arthritis involving multiple sites with positive rheumatoid factor ([place]) 11/05/2016  . High risk medication use 11/05/2016  . LPRD (laryngopharyngeal reflux disease) 09/20/2015  . Mild persistent asthma 09/20/2015  . Allergic rhinoconjunctivitis 09/20/2015  . Acute sinusitis 07/29/2015  . Asthma with acute exacerbation 07/29/2015  . S/P total hysterectomy 05/25/2015    History obtained from: chart review and patient.  Julie M Gaige's Primary Care Provider is Julie Broad, FNP.     Julie is a 53 y.o. female presenting for a follow up visit. She was last seen in October 2019.  At that time, her lung testing looked great.  We continued Qvar 80 mcg 2 puffs twice daily with Xolair 300 mg monthly.  We continued her on Dexilant 60 mg daily for her reflux.  We also continued Xyzal 5 mg daily for  her allergic rhinitis.  We did diagnose her with sinusitis and started her on Augmentin.  We recommended the addition of Mucinex as well. She has been on nasal sprays, but they have dried her out so she has stopped them all.  Since the last visit, she has mostly done well. She did take the Augmentin and symptoms resolved. But she continues to have some postnasal drip and a hoarse voice. She has not been on any of her nasal sprays as they dry  her out quite a bit. She has been using the nasal saline gel with improvement in her symptoms. Review shows that she has been on allergy injections, but she continued to have large local reactions, which did limit her updosing of her shots. She is not interested in going on them again.  She continues to follow with Dr. Constance Holster for her ear problems, but she does not see him on a regular basis.  She thinks she saw him around 2 years ago. She does not think that she has ever been diagnosed with nasal polyps.   She remains on Qvar 80 mcg 2 puffs twice daily as her controller medication for asthma. Julie [name]'s asthma has been well controlled. She has not required rescue medication, experienced nocturnal awakenings due to lower respiratory symptoms, nor have activities of daily living been limited. She has required no Emergency Department or Urgent Care visits for her asthma. She has required zero courses of systemic steroids for asthma exacerbations since the last visit. ACT score today is 21, indicating excellent asthma symptom control.  She does not remember the last time that she needed prednisone for her breathing.  She does have a history of reflux which is somewhat controlled with Dexilant and ranitidine.  She remains on Plaquenil as well as methotrexate and folic acid for her rheumatoid arthritis.   Otherwise, there have been no changes to her past medical history, surgical history, family history, or social history.    Review of Systems: a 14-point review of systems is pertinent for what is mentioned in HPI.  Otherwise, all other systems were negative.  Constitutional: negative other than that listed in the HPI Eyes: negative other than that listed in the HPI Ears, nose, mouth, throat, and face: negative other than that listed in the HPI Respiratory: negative other than that listed in the HPI Cardiovascular: negative other than that listed in the HPI Gastrointestinal: negative other than that listed in  the HPI Genitourinary: negative other than that listed in the HPI Integument: negative other than that listed in the HPI Hematologic: negative other than that listed in the HPI Musculoskeletal: negative other than that listed in the HPI Neurological: negative other than that listed in the HPI Allergy/Immunologic: negative other than that listed in the HPI    Objective:   Blood pressure 117/78, pulse 78, resp. rate 14, height 5\' 1"  (1.549 m), last menstrual period 05/19/2015, SpO2 98 %. Body mass index is 37.19 kg/m.   Physical Exam:  General: Alert, interactive, in no acute distress. Pleasant female.  Eyes: No conjunctival injection bilaterally, no discharge on the right, no discharge on the left and no Horner-Trantas dots present. PERRL bilaterally. EOMI without pain. No photophobia.  Ears: Right TM pearly gray with normal light reflex, Left TM pearly gray with normal light reflex, Right TM intact without perforation and Left TM intact without perforation.  Nose/Throat: External nose within normal limits and nasal crease present. Turbinates edematous with clear discharge. Posterior oropharynx erythematous without cobblestoning in  the posterior oropharynx. Tonsils 2+ without exudates.  Tongue without thrush. Lungs: Clear to auscultation without wheezing, rhonchi or rales. No increased work of breathing. CV: Normal S1/S2. No murmurs. Capillary refill <2 seconds.  Skin: Warm and dry, without lesions or rashes. Neuro:   Grossly intact. No focal deficits appreciated. Responsive to questions.  Diagnostic studies:   Spirometry: results normal (FEV1: 1.62/78%, FVC: 1.87/73%, FEV1/FVC: 87%).    Spirometry consistent with normal pattern.   Allergy Studies: none      Salvatore Marvel, MD  [facility]

## 2018-09-23 NOTE — Progress Notes (Signed)
Immunotherapy   Patient Details  Name: [place] M Sudbeck MRN: [id number] Date of Birth: 26-Mar-1966  09/23/2018  [place] M Donaho started injections for  DUPIXENT  Frequency: EVERY 2 WEEKS  Epi-Pen: YES Consent signed and patient instructions given. Patient was given 600mg  of DUPIXENT in office today as a loading dose. Patient waited full time and did not experience any issues.    Romeo Apple R 09/23/2018, 9:43 AM

## 2018-10-04 ENCOUNTER — Other Ambulatory Visit: Payer: Self-pay | Admitting: Rheumatology

## 2018-10-06 NOTE — Telephone Encounter (Signed)
Last visit: 07/23/18 Next Visit: 10/24/18 Labs: 08/11/18 Glucose is 108. All other labs are WNL.  Okay to refill per Dr. Estanislado Pandy

## 2018-10-08 ENCOUNTER — Encounter (HOSPITAL_COMMUNITY): Payer: Self-pay | Admitting: *Deleted

## 2018-10-08 ENCOUNTER — Emergency Department (HOSPITAL_COMMUNITY): Payer: 59

## 2018-10-08 ENCOUNTER — Other Ambulatory Visit: Payer: Self-pay

## 2018-10-08 ENCOUNTER — Emergency Department (HOSPITAL_COMMUNITY)
Admission: EM | Admit: 2018-10-08 | Discharge: 2018-10-09 | Disposition: A | Discharge status: Home / Self Care | Payer: 59 | Attending: Emergency Medicine | Admitting: Emergency Medicine

## 2018-10-08 DIAGNOSIS — R0789 Other chest pain: Secondary | ICD-10-CM

## 2018-10-08 DIAGNOSIS — Z79899 Other long term (current) drug therapy: Secondary | ICD-10-CM | POA: Diagnosis not present

## 2018-10-08 DIAGNOSIS — T148XXA Other injury of unspecified body region, initial encounter: Secondary | ICD-10-CM

## 2018-10-08 DIAGNOSIS — J45909 Unspecified asthma, uncomplicated: Secondary | ICD-10-CM | POA: Diagnosis not present

## 2018-10-08 MED ORDER — SODIUM CHLORIDE 0.9% FLUSH: 3.0000 mL | Freq: Once | INTRAVENOUS | Status: DC

## 2018-10-08 NOTE — ED Triage Notes (Signed)
Pt reports left sided chest pain (over the left breast) that goes into her back. Onset of chest pain yesterday, woke her up from her sleep. Pain worse today, associated with SOB, nausea.

## 2018-10-09 LAB — BASIC METABOLIC PANEL
Anion gap: 9 (ref 5–15)
BUN: 13 mg/dL (ref 6–20)
CO2: 25 mmol/L (ref 22–32)
Calcium: 9 mg/dL (ref 8.9–10.3)
Chloride: 104 mmol/L (ref 98–111)
Creatinine, Ser: 0.65 mg/dL (ref 0.44–1.00)
GFR calc Af Amer: >60 mL/min (ref >60)
GFR calc non Af Amer: >60 mL/min (ref >60)
Glucose, Bld: 95 mg/dL (ref 70–99)
Potassium: 4.5 mmol/L (ref 3.5–5.1)
Sodium: 138 mmol/L (ref 135–145)

## 2018-10-09 LAB — CBC
HCT: 39.1 % (ref 36.0–46.0)
Hemoglobin: 12.6 g/dL (ref 12.0–15.0)
MCH: 27.9 pg (ref 26.0–34.0)
MCHC: 32.2 g/dL (ref 30.0–36.0)
MCV: 86.5 fL (ref 80.0–100.0)
Platelets: 264 10*3/uL (ref 150–400)
RBC: 4.52 MIL/uL (ref 3.87–5.11)
RDW: 12.3 % (ref 11.5–15.5)
WBC: 5.4 10*3/uL (ref 4.0–10.5)
nRBC: 0 % (ref 0.0–0.2)

## 2018-10-09 LAB — TROPONIN I: Troponin I: <0.03 ng/mL (ref <0.03)

## 2018-10-09 LAB — D-DIMER, QUANTITATIVE: D-Dimer, Quant: 0.39 ug/mL-FEU (ref 0.00–0.50)

## 2018-10-09 MED ORDER — ACETAMINOPHEN 500 MG PO TABS
1000.0000 mg | ORAL_TABLET | Freq: Once | ORAL | Status: AC
  Administered 2018-10-09: 1000 mg via ORAL
  Filled 2018-10-09: qty 2

## 2018-10-09 NOTE — Discharge Instructions (Signed)
You may use over-the-counter Motrin (Ibuprofen), Acetaminophen (Tylenol), topical muscle creams such as SalonPas, Icy Hot, Bengay, etc. Please stretch, apply heat, and have massage therapy for additional assistance. ° °

## 2018-10-09 NOTE — ED Provider Notes (Signed)
[facility] EMERGENCY DEPARTMENT Provider Note  CSN: [id number] Arrival date & time: 10/08/18 2324  Chief Complaint(s) Chest Pain  HPI Julie [name] is a 53 y.o. female with a history of rheumatoid arthritis on Plaquenil and methotrexate and fibromyalgia who presents to the emergency department with 2 to 3 days of constant left anterior and lateral chest pain that is been constant since onset.  It is exacerbated with movement, breathing and palpation.  Pain is a aching stabbing pain.  It is nonradiating, nonexertional.  There is mild associated shortness of breath when pain is severe.  She denies any leg pain or swelling.  No prior DVTs or PEs.  She endorses associated nausea without emesis.  No abdominal pain.  HPI  Past Medical History Past Medical History:  Diagnosis Date  . Asthma   . Complication of anesthesia   . Eczema   . PONV (postoperative nausea and vomiting)   . Rheumatoid arthritis [facility])    Patient Active Problem List   Diagnosis Date Noted  . Fibromyalgia 02/14/2018  . History of gastroesophageal reflux (GERD) 02/14/2018  . History of asthma 02/14/2018  . Rheumatoid arthritis involving multiple sites with positive rheumatoid factor ([place]) 11/05/2016  . High risk medication use 11/05/2016  . LPRD (laryngopharyngeal reflux disease) 09/20/2015  . Mild persistent asthma 09/20/2015  . Allergic rhinoconjunctivitis 09/20/2015  . Acute sinusitis 07/29/2015  . Asthma with acute exacerbation 07/29/2015  . S/P total hysterectomy 05/25/2015   Home Medication(s) Prior to Admission medications   Medication Sig Start Date End Date Taking? Authorizing Provider  albuterol (PROAIR HFA) 108 (90 Base) MCG/ACT inhaler TAKE 2 PUFFS EVERY 4 TO 6 HOURS AS NEEDED FOR COUGH/WHEEZE Patient taking differently: Inhale 2 puffs into the lungs every 4 (four) hours as needed.  09/23/18  Yes Valentina Shaggy, MD  amitriptyline (ELAVIL) 25 MG tablet Take 50 mg by mouth at  bedtime.  10/29/17  Yes [provider]  beclomethasone (QVAR REDIHALER) 80 MCG/ACT inhaler Inhale 2 puffs into the lungs 2 (two) times daily. 09/23/18  Yes Valentina Shaggy, MD  dexlansoprazole (DEXILANT) 60 MG capsule TAKE ONE DAILY IN THE MORNING TO PREVENT REFLUX. Patient taking differently: Take 60 mg by mouth daily.  09/23/18  Yes Valentina Shaggy, MD  diclofenac sodium (VOLTAREN) 1 % GEL Apply 3 gm to 3 large joints up to 3 times a day.Dispense 3 tubes with 3 refills. Patient taking differently: Apply 3 g topically 3 (three) times daily as needed (for pain).  11/14/17  Yes Deveshwar, Abel Presto, MD  EPINEPHrine 0.3 mg/0.3 mL IJ SOAJ injection INJ 0.3 ML IM ONCE UTD Patient taking differently: Inject 0.3 mg into the muscle as needed for anaphylaxis.  09/23/18  Yes Valentina Shaggy, MD  escitalopram (LEXAPRO) 20 MG tablet Take 20 mg by mouth daily. 01/14/17  Yes [provider]  folic acid (FOLVITE) 1 MG tablet Take 1 tablet (1 mg total) by mouth 2 (two) times daily. 04/24/18 07/18/19 Yes Deveshwar, Abel Presto, MD  hydroxychloroquine (PLAQUENIL) 200 MG tablet TAKE 1 TABLET BY MOUTH TWICE A DAY [place] Patient taking differently: Take 200 mg by mouth 2 (two) times daily.  09/22/18  Yes Deveshwar, Abel Presto, MD  levocetirizine (XYZAL) 5 MG tablet Take 5 mg by mouth every evening.  09/19/16  Yes [provider]  methotrexate 50 MG/2ML injection INJECT 0.8 MLS (20 MG TOTAL) INTO THE SKIN ONCE A WEEK. Patient taking differently: Inject 20 mg into the skin every  Monday.  10/06/18  Yes Deveshwar, Abel Presto, MD  montelukast (SINGULAIR) 10 MG tablet TAKE 1 TABLET BY MOUTH AT BEDTIME Patient taking differently: Take 10 mg by mouth at bedtime.  07/16/16  Yes Kozlow, Donnamarie Poag, MD  Multiple Vitamins-Minerals (DAILY MULTIVITAMIN PO) Take 1 tablet by mouth daily.    Yes [provider]  ranitidine (ZANTAC) 300 MG tablet TAKE 1 TABLET BY MOUTH EVERYDAY AT BEDTIME Patient  taking differently: Take 300 mg by mouth at bedtime.  09/23/18  Yes Valentina Shaggy, MD  B-D TB SYRINGE 1CC/27GX1/2" 27G X 1/2" 1 ML MISC Inject 1 Syringe as directed once a week. 04/24/18   Bo Merino, MD                                                                                                                                    Past Surgical History Past Surgical History:  Procedure Laterality Date  . ABDOMINAL HYSTERECTOMY     partial age 18  . BILATERAL SALPINGECTOMY Bilateral 05/25/2015   Procedure: BILATERAL SALPINGECTOMY;  Surgeon: Servando Salina, MD;  Location: [facility];  Service: Gynecology;  Laterality: Bilateral;  . OVARIAN CYST REMOVAL Right 05/25/2015   Procedure: OVARIAN CYSTECTOMY;  Surgeon: Servando Salina, MD;  Location: [facility];  Service: Gynecology;  Laterality: Right;  . TONSILLECTOMY    . TUBAL LIGATION     Family History Family History  Problem Relation Age of Onset  . Asthma Mother   . Eczema Mother   . Sarcoidosis Mother   . Hypertension Mother   . Cancer Father   . Hypotension Father   . Asthma Sister   . GER disease Sister   . Allergic rhinitis Neg Hx   . Angioedema Neg Hx   . Immunodeficiency Neg Hx   . Urticaria Neg Hx     Social History Social History   Tobacco Use  . Smoking status: Never Smoker  . Smokeless tobacco: Never Used  Substance Use Topics  . Alcohol use: No  . Drug use: No   Allergies Aspirin and Augmentin [amoxicillin-pot clavulanate]  Review of Systems Review of Systems All other systems are reviewed and are negative for acute change except as noted in the HPI  Physical Exam Vital Signs  I have reviewed the triage vital signs BP 130/79   Pulse 70   Temp 97.8 F (36.6 C) (Oral)   Resp 12   LMP 05/19/2015   SpO2 100%   Physical Exam Vitals signs reviewed.  Constitutional:      General: She is not in acute distress.    Appearance: She is well-developed. She is not diaphoretic.  HENT:     Head:  Normocephalic and atraumatic.     Nose: Nose normal.  Eyes:     General: No scleral icterus.       Right eye: No discharge.        Left eye: No discharge.  Conjunctiva/sclera: Conjunctivae normal.     Pupils: Pupils are equal, round, and reactive to light.  Neck:     Musculoskeletal: Normal range of motion and neck supple.  Cardiovascular:     Rate and Rhythm: Normal rate and regular rhythm.     Heart sounds: No murmur. No friction rub. No gallop.   Pulmonary:     Effort: Pulmonary effort is normal. No respiratory distress.     Breath sounds: Normal breath sounds. No stridor. No rales.    Chest:     Chest wall: Tenderness present.    Abdominal:     General: There is no distension.     Palpations: Abdomen is soft.     Tenderness: There is no abdominal tenderness.  Musculoskeletal:        General: No tenderness.     Comments: No edema  Skin:    General: Skin is warm and dry.     Findings: No erythema or rash.  Neurological:     Mental Status: She is alert and oriented to person, place, and time.     ED Results and Treatments Labs (all labs ordered are listed, but only abnormal results are displayed) Labs Reviewed  BASIC METABOLIC PANEL  CBC  TROPONIN I  D-DIMER, QUANTITATIVE (NOT AT [facility])                                                                                                                         EKG  EKG Interpretation  Date/Time:  Wednesday October 08 2018 23:30:09 EST Ventricular Rate:  86 PR Interval:  176 QRS Duration: 80 QT Interval:  388 QTC Calculation: 464 R Axis:   57 Text Interpretation:  Normal sinus rhythm Normal ECG When compared with ECG of 04/29/2005, No significant change was found Confirmed by Delora Fuel ([id number]) on 10/09/2018 12:18:21 AM      Radiology Dg Chest 2 View  Result Date: 10/09/2018 CLINICAL DATA:  Chest pain EXAM: CHEST - 2 VIEW COMPARISON:  11/22/2014 FINDINGS: The heart size and mediastinal contours are within  normal limits. Both lungs are clear. The visualized skeletal structures are unremarkable. IMPRESSION: No active cardiopulmonary disease. Electronically Signed   By: Donavan Foil M.D.   On: 10/09/2018 00:06   Pertinent labs & imaging results that were available during my care of the patient were reviewed by me and considered in my medical decision making (see chart for details).  Medications Ordered in ED Medications  sodium chloride flush (NS) 0.9 % injection 3 mL (has no administration in time range)  acetaminophen (TYLENOL) tablet 1,000 mg (1,000 mg Oral Given 10/09/18 0402)  Procedures Procedures  (including critical care time)  Medical Decision Making / ED Course I have reviewed the nursing notes for this encounter and the patient's prior records (if available in EHR or on provided paperwork).    Patient presents with left-sided chest pain most consistent with muscle strain/spasm or chest wall pain.  Highly inconsistent with ACS.  Low suspicion for cardiac etiology.  EKG without acute ischemic changes or evidence of pericarditis.  Troponin negative.  Given the duration of the pain the single troponin is sufficient to rule out ACS.  Low suspicion for pulmonary embolism but given patient's age and autoimmune disorder, cannot PERC out.  D-dimer was obtained and negative.  Unlikely PE.  Presentation not classic for aortic dissection or esophageal perforation.  Chest x-ray without evidence suggestive of pneumonia, pneumothorax, pneumomediastinum.  No abnormal contour of the mediastinum to suggest dissection. No evidence of acute injuries.  The patient appears reasonably screened and/or stabilized for discharge and I doubt any other medical condition or other [facility] requiring further screening, evaluation, or treatment in the ED at this time prior to discharge.  The  patient is safe for discharge with strict return precautions.   Final Clinical Impression(s) / ED Diagnoses Final diagnoses:  Chest wall pain  Muscle strain   Disposition: Discharge  Condition: Good  I have discussed the results, Dx and Tx plan with the patient who expressed understanding and agree(s) with the plan. Discharge instructions discussed at great length. The patient was given strict return precautions who verbalized understanding of the instructions. No further questions at time of discharge.    ED Discharge Orders    None       Follow Up: Suella Broad, [address] [place] [id number] [phone]  Schedule an appointment as soon as possible for a visit  As needed      This chart was dictated using voice recognition software.  Despite best efforts to proofread,  errors can occur which can change the documentation meaning.   Fatima Blank, MD 10/09/18 [phone]

## 2018-10-09 NOTE — ED Notes (Signed)
Chest pain since yesterday.  I had to awake the pt to assess  Her   Chest pain since yesterday

## 2018-10-10 ENCOUNTER — Encounter: Payer: Self-pay | Admitting: Family Medicine

## 2018-10-10 ENCOUNTER — Ambulatory Visit: Payer: 59 | Admitting: Family Medicine

## 2018-10-10 VITALS — BP 124/72 | HR 105 | Resp 16

## 2018-10-10 DIAGNOSIS — J4541 Moderate persistent asthma with (acute) exacerbation: Secondary | ICD-10-CM

## 2018-10-10 DIAGNOSIS — J302 Other seasonal allergic rhinitis: Secondary | ICD-10-CM | POA: Insufficient documentation

## 2018-10-10 DIAGNOSIS — J3089 Other allergic rhinitis: Secondary | ICD-10-CM

## 2018-10-10 DIAGNOSIS — K219 Gastro-esophageal reflux disease without esophagitis: Secondary | ICD-10-CM | POA: Diagnosis not present

## 2018-10-10 MED ORDER — DUPILUMAB 300 MG/2ML ~~LOC~~ SOSY
300.0000 mg | PREFILLED_SYRINGE | SUBCUTANEOUS | Status: AC
  Administered 2019-12-31: 600 mg via SUBCUTANEOUS
  Administered 2020-01-12 – 2024-10-15 (×102): 300 mg via SUBCUTANEOUS

## 2018-10-10 MED ORDER — FAMOTIDINE 20 MG PO TABS: 20.0000 mg | ORAL_TABLET | Freq: Two times a day (BID) | ORAL | 5 refills | Status: DC

## 2018-10-10 NOTE — Patient Instructions (Addendum)
1. Moderate persistent asthma without complication - Prednisone 10 mg tablets. If needed, take 1 tablet once a day for 5 days, then stop - We are going to continue with Dupixent today since [place] can help with nasal symptoms as well.  - Daily controller medication(s): Qvar 70mcg Redihaler 2 puffs twice daily + Dupixent every two weeks - Prior to physical activity: ProAir 2 puffs 10-15 minutes before physical activity - Rescue medications: ProAir 4 puffs every 4-6 hours as needed or albuterol nebulizer one vial puffs every 4-6 hours as needed - Asthma control goals:  * Full participation in all desired activities (may need albuterol before activity) * Albuterol use two time or less a week on average (not counting use with activity) * Cough interfering with sleep two time or less a month * Oral steroids no more than once a year * No hospitalizations  2. LPRD (laryngopharyngeal reflux disease) - Continue with Dexilant 60mg  daily.  - Stop ranitidine and start famotidine 20 mg twice a day.   3. Seasonal allergic rhinitis - Continue with levocetirizine (Xyzal) 5mg  daily. - Continue with [place] nasal saline gel.  - Hopefully the addition of the [place] will help with the rhinitis symptoms and the asthma symptoms.    4. Follow up in 2 weeks or sooner if needed  Please inform us of any Emergency Department visits, hospitalizations, or changes in symptoms. Call us before going to the ED for breathing or allergy symptoms since we might be able to fit you in for a sick visit. Feel free to contact us anytime with any questions, problems, or concerns.  It was a pleasure to see you again today!   Websites that have reliable patient information: 1. American Academy of Asthma, Allergy, and Immunology: www.aaaai.org 2. Food Allergy Research and Education (FARE): foodallergy.org 3. Mothers of Asthmatics: http://www.asthmacommunitynetwork.org 4. American College of Allergy, Asthma, and Immunology:  www.acaai.org

## 2018-10-10 NOTE — Progress Notes (Signed)
[address] [place] [address] Dept: [phone]  FOLLOW UP NOTE  Patient ID: Julie [name], female    DOB: 14-Mar-1966  Age: 53 y.o. MRN: [id number] Date of Office Visit: 10/10/2018  Assessment  Chief Complaint: Asthma  HPI Julie HAEVEN NICKLE is a 53 year old female who presents to the clinic for a sick visit. She reports a stabbing pain in the left chest and flank area which is worse when she is lying down that began on Wednesday night. She reports that her blood pressure was high and she felt as though her breathing was heavy for which she visited the ED on Wednesday night. While in the ED she had a negative chest xray, negative troponin, and negative d-dimer. At today's visit, she reports occasional shortness of breath and no cough or wheeze. She reports that she continues to experience a stabbing pain in the left side and across her upper back on the left side which is worse when she takes a deep breath in. She continues Qvar 80-2 puffs twice a day and is using her albuterol inhaler 4-5 times a week with 2 times yesterday. She received a loading dose of Dupixent at her last visit to this office. She denies reflux and continues Dexilant and ranitidine for control of reflux. Her current medications are listed in the chart.  Drug Allergies:  Allergies  Allergen Reactions  . Aspirin Other (See Comments)    pt couldn't take while on methotrexate, but is no longer taking  . Augmentin [Amoxicillin-Pot Clavulanate] Other (See Comments)    G I upset    Physical Exam: BP 124/72 (BP Location: Left Arm, Patient Position: Sitting, Cuff Size: Large)   Pulse (!) 105   Resp 16   LMP 05/19/2015   SpO2 95%    Physical Exam  Diagnostics: FVC 1.96, FEV1 1.73.  Predicted FVC 2.57, predicted FEV1 2.07.  Spirometry indicates mild restriction.  Postbronchodilator FVC 1.85, FEV1 1.61.  Postbronchodilator spirometry indicates mild restriction with no significant bronchodilator  response.  Assessment and Plan: 1. Moderate persistent asthma with acute exacerbation   2. Other allergic rhinitis   3. LPRD (laryngopharyngeal reflux disease)     Meds ordered this encounter  Medications  . Dupilumab SOSY 300 mg  . famotidine (PEPCID) 20 MG tablet    Sig: Take 1 tablet (20 mg total) by mouth 2 (two) times daily for 30 days.    Dispense:  60 tablet    Refill:  5    Patient Instructions  1. Moderate persistent asthma without complication - Prednisone 10 mg tablets. If needed, take 1 tablet once a day for 5 days, then stop - We are going to continue with Dupixent today since [place] can help with nasal symptoms as well.  - Daily controller medication(s): Qvar 28mcg Redihaler 2 puffs twice daily + Dupixent every two weeks - Prior to physical activity: ProAir 2 puffs 10-15 minutes before physical activity - Rescue medications: ProAir 4 puffs every 4-6 hours as needed or albuterol nebulizer one vial puffs every 4-6 hours as needed - Asthma control goals:  * Full participation in all desired activities (may need albuterol before activity) * Albuterol use two time or less a week on average (not counting use with activity) * Cough interfering with sleep two time or less a month * Oral steroids no more than once a year * No hospitalizations  2. LPRD (laryngopharyngeal reflux disease) - Continue with Dexilant 60mg  daily.  - Stop ranitidine  and start famotidine 20 mg twice a day.   3. Seasonal allergic rhinitis - Continue with levocetirizine (Xyzal) 5mg  daily. - Continue with [place] nasal saline gel.  - Hopefully the addition of the [place] will help with the rhinitis symptoms and the asthma symptoms.    4. Follow up in 2 weeks or sooner if needed  Please inform us of any Emergency Department visits, hospitalizations, or changes in symptoms. Call us before going to the ED for breathing or allergy symptoms since we might be able to fit you in for a sick visit. Feel free  to contact us anytime with any questions, problems, or concerns.  It was a pleasure to see you again today!   Websites that have reliable patient information: 1. American Academy of Asthma, Allergy, and Immunology: www.aaaai.org 2. Food Allergy Research and Education (FARE): foodallergy.org 3. Mothers of Asthmatics: http://www.asthmacommunitynetwork.org 4. American College of Allergy, Asthma, and Immunology: www.acaai.org   Return in about 2 weeks (around 10/24/2018), or if symptoms worsen or fail to improve.    Thank you for the opportunity to care for this patient.  Please do not hesitate to contact me with questions.  Gareth Morgan, FNP Allergy and [place] [facility] [place]

## 2018-10-13 ENCOUNTER — Telehealth: Payer: Self-pay | Admitting: *Deleted

## 2018-10-13 NOTE — Telephone Encounter (Signed)
-----   Message from Dara Hoyer, FNP sent at 10/13/2018  9:07 AM EST ----- Can you please call this patient and find out if she is breathing better? Thank you

## 2018-10-13 NOTE — Telephone Encounter (Signed)
Called patient and advised. Patient verbalized understanding and did go to her doctor appointment.

## 2018-10-13 NOTE — Telephone Encounter (Signed)
Called patient to inquire how she is doing. Patient states that she feels that she is getting some better, she states that she is using both inhalers and is taking her prednisone. She does state that she can tell improvement and that the tx has helped. She is still having some SOBr and wen she takes a deep breath she does still feel a sharp pain and feels a little light headed. She does have an appt today with her family doctor at 3:15pm and will have them check on her breathing then. Please advise.

## 2018-10-13 NOTE — Telephone Encounter (Signed)
Please have her keep her appointment with her PCP today and continue her current therapy with the addition of heating pad to the left side where she is feeling the pain with inspiration. Thank you

## 2018-10-24 ENCOUNTER — Ambulatory Visit: Payer: 59 | Admitting: Physician Assistant

## 2018-11-10 ENCOUNTER — Encounter: Payer: Self-pay | Admitting: Gastroenterology

## 2018-11-10 ENCOUNTER — Other Ambulatory Visit: Payer: Self-pay

## 2018-11-10 DIAGNOSIS — Z79899 Other long term (current) drug therapy: Secondary | ICD-10-CM

## 2018-11-11 LAB — CBC WITH DIFFERENTIAL/PLATELET
Absolute Monocytes: 593 cells/uL (ref 200–950)
Basophils Absolute: 52 cells/uL (ref 0–200)
Basophils Relative: 1 %
Eosinophils Absolute: 291 cells/uL (ref 15–500)
Eosinophils Relative: 5.6 %
HCT: 35.6 % (ref 35.0–45.0)
Hemoglobin: 12 g/dL (ref 11.7–15.5)
Lymphs Abs: 1721 cells/uL (ref 850–3900)
MCH: 28.4 pg (ref 27.0–33.0)
MCHC: 33.7 g/dL (ref 32.0–36.0)
MCV: 84.2 fL (ref 80.0–100.0)
MPV: 11.2 fL (ref 7.5–12.5)
Monocytes Relative: 11.4 %
Neutro Abs: 2543 cells/uL (ref 1500–7800)
Neutrophils Relative %: 48.9 %
Platelets: 258 10*3/uL (ref 140–400)
RBC: 4.23 10*6/uL (ref 3.80–5.10)
RDW: 13 % (ref 11.0–15.0)
Total Lymphocyte: 33.1 %
WBC: 5.2 10*3/uL (ref 3.8–10.8)

## 2018-11-11 LAB — COMPLETE METABOLIC PANEL WITH GFR
AG Ratio: 1.5 (calc) (ref 1.0–2.5)
ALT: 19 U/L (ref 6–29)
AST: 21 U/L (ref 10–35)
Albumin: 4.1 g/dL (ref 3.6–5.1)
Alkaline phosphatase (APISO): 81 U/L (ref 37–153)
BUN: 12 mg/dL (ref 7–25)
CO2: 28 mmol/L (ref 20–32)
Calcium: 9 mg/dL (ref 8.6–10.4)
Chloride: 105 mmol/L (ref 98–110)
Creat: 0.61 mg/dL (ref 0.50–1.05)
GFR, Est African American: 123 mL/min/{1.73_m2} (ref >=60)
GFR, Est Non African American: 106 mL/min/{1.73_m2} (ref >=60)
Globulin: 2.8 g/dL (calc) (ref 1.9–3.7)
Glucose, Bld: 87 mg/dL (ref 65–99)
Potassium: 4.3 mmol/L (ref 3.5–5.3)
Sodium: 139 mmol/L (ref 135–146)
Total Bilirubin: 0.3 mg/dL (ref 0.2–1.2)
Total Protein: 6.9 g/dL (ref 6.1–8.1)

## 2018-12-01 ENCOUNTER — Telehealth: Payer: Self-pay | Admitting: *Deleted

## 2018-12-01 NOTE — Telephone Encounter (Signed)
Covid-19 travel screening questions  Have you traveled in the last 14 days? No  If yes where?  Do you now or have you had a fever in the last 14 days?  No   Do you have any respiratory symptoms of shortness of breath or cough now or in the last 14 days? No   Do you have a medical history of Congestive Heart Failure? No   Do you have a medical history of lung disease? Pt has asthma   Do you have any family members or close contacts with diagnosed or suspected Covid-19? No

## 2018-12-03 ENCOUNTER — Other Ambulatory Visit: Payer: Self-pay | Admitting: Rheumatology

## 2018-12-04 NOTE — Telephone Encounter (Signed)
Please schedule patient a follow up visit. Patient was due for follow up in February 2020. Thanks!

## 2018-12-04 NOTE — Telephone Encounter (Signed)
LMOM for patient to call and schedule follow-up appointment.   °

## 2018-12-04 NOTE — Telephone Encounter (Signed)
Last visit: 07/23/18 Next Visit:due in February 2020. Message sent to the front to schedule patient.  Labs: 11/10/18 WNL. PLQ Eye Exam: 02/12/18 WNL

## 2018-12-12 ENCOUNTER — Encounter: Payer: 59 | Admitting: Gastroenterology

## 2018-12-16 ENCOUNTER — Other Ambulatory Visit: Payer: Self-pay | Admitting: Rheumatology

## 2018-12-16 NOTE — Telephone Encounter (Signed)
Last visit: 07/23/18 Next Visit:due in February 2020. Message sent to the front to schedule patient.  Labs: 11/10/18 WNL.  Okay to refill per Dr. Estanislado Pandy

## 2018-12-30 ENCOUNTER — Ambulatory Visit: Payer: 59 | Admitting: Allergy & Immunology

## 2019-01-28 NOTE — Progress Notes (Signed)
Office Visit Note  Patient: Julie [name]             Date of Birth: Mar 24, 1966           MRN: [id number]             PCP: Suella Broad, FNP Referring: Suella Broad,* Visit Date: 02/04/2019 Occupation: @GUAROCC @  Subjective:  Generalized muscle tenderness   History of Present Illness: Julie SRIYA KROEZE is a 53 y.o. female with history of seropositive rheumatoid arthritis and fibromyalgia.  She is on MTX 1.0 ml sq once weekly, folic acid 2 mg po daily, and PLQ 200 mg BID.  She denies any recent flares since increasing the dose of MTX and PLQ.  She denies any joint pain or joint swelling.  She denies any joint stiffness.  She has been under increased stress due to her father being sick and under hospice care.  She states she has been having increased muscle aches and muscle tenderness due to fibromyalgia for the past 2 weeks.  She has been exercising at home.    Activities of Daily Living:  Patient reports morning stiffness for 0 minutes.   Patient Denies nocturnal pain.  Difficulty dressing/grooming: Denies Difficulty climbing stairs: Denies Difficulty getting out of chair: Denies Difficulty using hands for taps, buttons, cutlery, and/or writing: Denies  Review of Systems  Constitutional: Positive for fatigue.  HENT: Negative for mouth sores, mouth dryness and nose dryness.   Eyes: Negative for pain, visual disturbance and dryness.  Respiratory: Negative for cough, hemoptysis, shortness of breath and difficulty breathing.   Cardiovascular: Negative for chest pain, palpitations, hypertension and swelling in legs/feet.  Gastrointestinal: Negative for blood in stool, constipation and diarrhea.  Endocrine: Negative for increased urination.  Genitourinary: Negative for painful urination.  Musculoskeletal: Positive for myalgias, muscle tenderness and myalgias. Negative for arthralgias, joint pain, joint swelling, muscle weakness and morning stiffness.  Skin:  Negative for color change, pallor, rash, hair loss, nodules/bumps, skin tightness, ulcers and sensitivity to sunlight.  Allergic/Immunologic: Negative for susceptible to infections.  Neurological: Negative for dizziness, numbness, headaches and weakness.  Hematological: Negative for swollen glands.  Psychiatric/Behavioral: Negative for depressed mood and sleep disturbance. The patient is nervous/anxious.     PMFS History:  Patient Active Problem List   Diagnosis Date Noted   Other allergic rhinitis 10/10/2018   Fibromyalgia 02/14/2018   History of gastroesophageal reflux (GERD) 02/14/2018   History of asthma 02/14/2018   Rheumatoid arthritis involving multiple sites with positive rheumatoid factor ([place]) 11/05/2016   High risk medication use 11/05/2016   LPRD (laryngopharyngeal reflux disease) 09/20/2015   Mild persistent asthma 09/20/2015   Allergic rhinoconjunctivitis 09/20/2015   Acute sinusitis 07/29/2015   Asthma with acute exacerbation 07/29/2015   S/P total hysterectomy 05/25/2015    Past Medical History:  Diagnosis Date   Asthma    Complication of anesthesia    Eczema    PONV (postoperative nausea and vomiting)    Rheumatoid arthritis ([place])     Family History  Problem Relation Age of Onset   Asthma Mother    Eczema Mother    Sarcoidosis Mother    Hypertension Mother    Cancer Father    Hypotension Father    Congestive Heart Failure Father    Colon cancer Father    Asthma Sister    GER disease Sister    Allergic rhinitis Neg Hx    Angioedema Neg Hx    Immunodeficiency Neg Hx  Urticaria Neg Hx    Past Surgical History:  Procedure Laterality Date   ABDOMINAL HYSTERECTOMY     partial age 89   BILATERAL SALPINGECTOMY Bilateral 05/25/2015   Procedure: BILATERAL SALPINGECTOMY;  Surgeon: Servando Salina, MD;  Location: [facility];  Service: Gynecology;  Laterality: Bilateral;   OVARIAN CYST REMOVAL Right 05/25/2015   Procedure:  OVARIAN CYSTECTOMY;  Surgeon: Servando Salina, MD;  Location: [facility];  Service: Gynecology;  Laterality: Right;   TONSILLECTOMY     TUBAL LIGATION     Social History   Social History Narrative   Not on file   Immunization History  Administered Date(s) Administered   Influenza Inj Mdck Quad Pf 10/19/2017   Influenza, Seasonal, Injecte, Preservative Fre 09/20/2015     Objective: Vital Signs: BP 131/87 (BP Location: Right Arm, Patient Position: Sitting, Cuff Size: Normal)    Pulse 85    Resp 13    Ht 5' 1.25" (1.556 m)    Wt 198 lb (89.8 kg)    LMP 05/19/2015    BMI 37.11 kg/m    Physical Exam Vitals signs and nursing note reviewed.  Constitutional:      Appearance: She is well-developed.  HENT:     Head: Normocephalic and atraumatic.  Eyes:     Conjunctiva/sclera: Conjunctivae normal.  Neck:     Musculoskeletal: Normal range of motion.  Cardiovascular:     Rate and Rhythm: Normal rate and regular rhythm.     Heart sounds: Normal heart sounds.  Pulmonary:     Effort: Pulmonary effort is normal.     Breath sounds: Normal breath sounds.  Abdominal:     General: Bowel sounds are normal.     Palpations: Abdomen is soft.  Lymphadenopathy:     Cervical: No cervical adenopathy.  Skin:    General: Skin is warm and dry.     Capillary Refill: Capillary refill takes less than 2 seconds.  Neurological:     Mental Status: She is alert and oriented to person, place, and time.  Psychiatric:        Behavior: Behavior normal.      Musculoskeletal Exam: C-spine, thoracic spine, and lumbar spine good ROM.  No midline spinal tenderness.  No SI joint tenderness.  Shoulder joints, elbow joints, wrist joints, MCPs, PIPs ,and DIPs good ROM with no synovitis.  Complete fist formation bilaterally.  Hip joints, knee joints, ankle joints, MTPs, PIPs ,and DIPs good ROM with no synovitis.  No warmth or effusion of knee joints.  No tenderness or swelling of ankle joints.    CDAI Exam: CDAI  Score: Not documented Patient Global Assessment: Not documented; Provider Global Assessment: Not documented Swollen: Not documented; Tender: Not documented Joint Exam   Not documented   There is currently no information documented on the homunculus. Go to the Rheumatology activity and complete the homunculus joint exam.  Investigation: No additional findings.  Imaging: No results found.  Recent Labs: Lab Results  Component Value Date   WBC 5.2 11/10/2018   HGB 12.0 11/10/2018   PLT 258 11/10/2018   NA 139 11/10/2018   K 4.3 11/10/2018   CL 105 11/10/2018   CO2 28 11/10/2018   GLUCOSE 87 11/10/2018   BUN 12 11/10/2018   CREATININE 0.61 11/10/2018   BILITOT 0.3 11/10/2018   ALKPHOS 55 03/26/2017   AST 21 11/10/2018   ALT 19 11/10/2018   PROT 6.9 11/10/2018   ALBUMIN 3.7 03/26/2017   CALCIUM 9.0 11/10/2018   GFRAA  123 11/10/2018    Speciality Comments: PLQ Eye Exam: 02/12/18 WNL at [facility] Follow up in 1 year  Procedures:  No procedures performed Allergies: Aspirin and Augmentin [amoxicillin-pot clavulanate]   Assessment / Plan:     Visit Diagnoses: Rheumatoid arthritis involving multiple sites with positive rheumatoid factor ([place]) - +RF, +CCP: She has no synovitis or tenderness on exam today.  She has not had any recent rheumatoid arthritis flares.  She is clinically doing well on methotrexate 1.0 mL subcutaneous injections once weekly, folic acid 2 mg by mouth daily, Plaquenil 200 mg 1 tablet by mouth twice daily.  She has no joint pain or joint swelling at this time.  She has no morning stiffness.  She has noticed significant improvement since increasing her dose of methotrexate and Plaquenil.  She will continue on this current treatment regimen.  She needs refills of methotrexate, folic acid, syringes, Plaquenil today.  We also sent a refill of Voltaren gel.  She is advised to notify us if she develops increased joint pain or joint swelling.  She will follow-up in  the office in 5 months.  High risk medication use - MTX 1.0 ML subcutaneous every week, folic acid 2 mg by mouth daily, PLQ 200 mg by mouth twice a day.  Last PLQ eye exam WNL 02/12/18.  She was given a new Plaquenil eye exam form to take with her to her next appointment.  Most recent labs from 11/11/2018 were within normal limits.  CBC and CMP will be drawn today to monitor for drug toxicity.  She will return for lab work in August and every 3 months to monitor for drug toxicity.  Standing orders are in place.- Plan: CBC with Differential/Platelet, COMPLETE METABOLIC PANEL WITH GFR  Fibromyalgia: She has generalized muscle aches and muscle tenderness due to fibromyalgia.  She has been having a fibromyalgia flare for the past 2 weeks.  She has been under increased stress since her father is under hospice care.  She has chronic fatigue.  She has been working out and doing exercises at home on a regular basis.  She has been having some insomnia due to anxiety.  Trigger middle finger of right hand: Resolved   Other medical conditions are listed as follows:   History of gastroesophageal reflux (GERD)  History of asthma  History of depression   Orders: Orders Placed This Encounter  Procedures   CBC with Differential/Platelet   COMPLETE METABOLIC PANEL WITH GFR   No orders of the defined types were placed in this encounter.   Face-to-face time spent with patient was 20 minutes. Greater than 50% of time was spent in counseling and coordination of care.  Follow-Up Instructions: Return in about 5 months (around 07/07/2019) for Rheumatoid arthritis, Fibromyalgia.   Ofilia Neas, PA-C  Note - This record has been created using Dragon software.  Chart creation errors have been sought, but may not always  have been located. Such creation errors do not reflect on  the standard of medical care.

## 2019-02-04 ENCOUNTER — Other Ambulatory Visit: Payer: Self-pay

## 2019-02-04 ENCOUNTER — Ambulatory Visit: Payer: 59 | Admitting: Physician Assistant

## 2019-02-04 ENCOUNTER — Encounter: Payer: Self-pay | Admitting: Physician Assistant

## 2019-02-04 VITALS — BP 131/87 | HR 85 | Resp 13 | Ht 61.25 in | Wt 198.0 lb

## 2019-02-04 DIAGNOSIS — Z79899 Other long term (current) drug therapy: Secondary | ICD-10-CM | POA: Diagnosis not present

## 2019-02-04 DIAGNOSIS — M797 Fibromyalgia: Secondary | ICD-10-CM

## 2019-02-04 DIAGNOSIS — M0579 Rheumatoid arthritis with rheumatoid factor of multiple sites without organ or systems involvement: Secondary | ICD-10-CM

## 2019-02-04 DIAGNOSIS — Z8659 Personal history of other mental and behavioral disorders: Secondary | ICD-10-CM

## 2019-02-04 DIAGNOSIS — M65331 Trigger finger, right middle finger: Secondary | ICD-10-CM

## 2019-02-04 DIAGNOSIS — Z8719 Personal history of other diseases of the digestive system: Secondary | ICD-10-CM

## 2019-02-04 DIAGNOSIS — Z8709 Personal history of other diseases of the respiratory system: Secondary | ICD-10-CM

## 2019-02-04 MED ORDER — DICLOFENAC SODIUM 1 % TD GEL: TRANSDERMAL | 0 refills | Status: DC

## 2019-02-04 MED ORDER — METHOTREXATE SODIUM CHEMO INJECTION 50 MG/2ML: INTRAMUSCULAR | 2 refills | Status: DC

## 2019-02-04 MED ORDER — HYDROXYCHLOROQUINE SULFATE 200 MG PO TABS: 200.0000 mg | ORAL_TABLET | Freq: Two times a day (BID) | ORAL | 2 refills | Status: DC

## 2019-02-04 MED ORDER — "BD TB SYRINGE 27G X 1/2"" 1 ML MISC": 3 refills | Status: DC

## 2019-02-04 MED ORDER — FOLIC ACID 1 MG PO TABS: 1.0000 mg | ORAL_TABLET | Freq: Two times a day (BID) | ORAL | 4 refills | Status: DC

## 2019-02-05 LAB — COMPLETE METABOLIC PANEL WITH GFR
AG Ratio: 1.3 (calc) (ref 1.0–2.5)
ALT: 15 U/L (ref 6–29)
AST: 18 U/L (ref 10–35)
Albumin: 4.3 g/dL (ref 3.6–5.1)
Alkaline phosphatase (APISO): 74 U/L (ref 37–153)
BUN: 13 mg/dL (ref 7–25)
CO2: 27 mmol/L (ref 20–32)
Calcium: 9.5 mg/dL (ref 8.6–10.4)
Chloride: 101 mmol/L (ref 98–110)
Creat: 0.85 mg/dL (ref 0.50–1.05)
GFR, Est African American: 93 mL/min/{1.73_m2} (ref >=60)
GFR, Est Non African American: 80 mL/min/{1.73_m2} (ref >=60)
Globulin: 3.3 g/dL (calc) (ref 1.9–3.7)
Glucose, Bld: 103 mg/dL — ABNORMAL HIGH (ref 65–99)
Potassium: 5 mmol/L (ref 3.5–5.3)
Sodium: 137 mmol/L (ref 135–146)
Total Bilirubin: 0.3 mg/dL (ref 0.2–1.2)
Total Protein: 7.6 g/dL (ref 6.1–8.1)

## 2019-02-05 LAB — CBC WITH DIFFERENTIAL/PLATELET
Absolute Monocytes: 368 cells/uL (ref 200–950)
Basophils Absolute: 60 cells/uL (ref 0–200)
Basophils Relative: 1.3 %
Eosinophils Absolute: 152 cells/uL (ref 15–500)
Eosinophils Relative: 3.3 %
HCT: 38.9 % (ref 35.0–45.0)
Hemoglobin: 13.5 g/dL (ref 11.7–15.5)
Lymphs Abs: 2038 cells/uL (ref 850–3900)
MCH: 29.6 pg (ref 27.0–33.0)
MCHC: 34.7 g/dL (ref 32.0–36.0)
MCV: 85.3 fL (ref 80.0–100.0)
MPV: 11.6 fL (ref 7.5–12.5)
Monocytes Relative: 8 %
Neutro Abs: 1983 cells/uL (ref 1500–7800)
Neutrophils Relative %: 43.1 %
Platelets: 247 10*3/uL (ref 140–400)
RBC: 4.56 10*6/uL (ref 3.80–5.10)
RDW: 12.7 % (ref 11.0–15.0)
Total Lymphocyte: 44.3 %
WBC: 4.6 10*3/uL (ref 3.8–10.8)

## 2019-02-05 NOTE — Progress Notes (Signed)
Labs are WNL.

## 2019-02-10 ENCOUNTER — Encounter: Payer: Self-pay | Admitting: Gastroenterology

## 2019-02-10 DIAGNOSIS — T4145XA Adverse effect of unspecified anesthetic, initial encounter: Secondary | ICD-10-CM | POA: Insufficient documentation

## 2019-02-10 DIAGNOSIS — T8859XA Other complications of anesthesia, initial encounter: Secondary | ICD-10-CM | POA: Insufficient documentation

## 2019-02-11 ENCOUNTER — Telehealth: Payer: Self-pay | Admitting: *Deleted

## 2019-02-11 NOTE — Telephone Encounter (Signed)
Julie [name] this pt. Is scheduled for colonoscopy on 02/18/19@ 1:30,in reviewing her history it has complications of anesthesia,her last colon was done by Dr. Olevia Perches 11/17/08 at [facility] is she a candidate for [facility] please advise.

## 2019-02-11 NOTE — Telephone Encounter (Signed)
Julie [name],  I spoke with this pt; she has a h/o delayed emergence. I assured her she would awaken rapidly from propofol. She is cleared for anesthetic care at [facility]  Thanks,  Osvaldo Angst

## 2019-02-12 ENCOUNTER — Other Ambulatory Visit: Payer: Self-pay

## 2019-02-12 ENCOUNTER — Ambulatory Visit: Payer: 59

## 2019-02-12 VITALS — Ht 61.25 in | Wt 190.0 lb

## 2019-02-12 DIAGNOSIS — Z800 Family history of malignant neoplasm of digestive organs: Secondary | ICD-10-CM

## 2019-02-12 MED ORDER — NA SULFATE-K SULFATE-MG SULF 17.5-3.13-1.6 GM/177ML PO SOLN: 1.0000 | Freq: Once | ORAL | 0 refills | Status: AC

## 2019-02-12 NOTE — Progress Notes (Signed)
No egg or soy allergy known to patient  No issues with past sedation with any surgeries  or procedures, no intubation problems  No diet pills per patient No home 02 use per patient  No blood thinners per patient  Pt denies issues with constipation  No A fib or A flutter  EMMI video sent to pt's e mail  

## 2019-02-13 ENCOUNTER — Encounter: Payer: Self-pay | Admitting: Gastroenterology

## 2019-02-16 ENCOUNTER — Telehealth: Payer: Self-pay | Admitting: *Deleted

## 2019-02-16 ENCOUNTER — Telehealth: Payer: Self-pay

## 2019-02-16 MED ORDER — FAMOTIDINE 40 MG PO TABS: 40.0000 mg | ORAL_TABLET | Freq: Every day | ORAL | 5 refills | Status: DC

## 2019-02-16 NOTE — Telephone Encounter (Signed)
Received fax from [facility] stating new rx is needed for patient's Ranitidine 300 mg due to it no longer being on the market. Dr Neldon Mc please advise what to switch medication to?

## 2019-02-16 NOTE — Telephone Encounter (Signed)
Prescription has been changed and sent to the pharmacy

## 2019-02-16 NOTE — Telephone Encounter (Signed)
Famotidine 40mg  to replace ranitidine

## 2019-02-16 NOTE — Telephone Encounter (Signed)
Attempted to leave message but mailbox is full.

## 2019-02-16 NOTE — Telephone Encounter (Signed)
Patient has been informed of medication changed.

## 2019-02-17 ENCOUNTER — Telehealth: Payer: Self-pay | Admitting: *Deleted

## 2019-02-17 NOTE — Telephone Encounter (Signed)
Covid-19 screening questions  Have you traveled in the last 14 days?no If yes where?  Do you now or have you had a fever in the last 14 days?no  Do you have any respiratory symptoms of shortness of breath or cough now or in the last 14 days?no  Do you have any family members or close contacts with diagnosed or suspected Covid-19 in the past 14 days?no  Have you been tested for Covid-19 and found to be positive? No  Pt aware of care partner policy and will bring a mask with her. SM

## 2019-02-18 ENCOUNTER — Encounter: Payer: Self-pay | Admitting: Gastroenterology

## 2019-02-18 ENCOUNTER — Other Ambulatory Visit: Payer: Self-pay

## 2019-02-18 ENCOUNTER — Ambulatory Visit (AMBULATORY_SURGERY_CENTER): Payer: 59 | Admitting: Gastroenterology

## 2019-02-18 VITALS — BP 123/73 | HR 77 | Temp 98.8°F | Resp 16 | Ht 61.25 in | Wt 198.0 lb

## 2019-02-18 DIAGNOSIS — D128 Benign neoplasm of rectum: Secondary | ICD-10-CM | POA: Diagnosis not present

## 2019-02-18 DIAGNOSIS — Z1211 Encounter for screening for malignant neoplasm of colon: Secondary | ICD-10-CM

## 2019-02-18 DIAGNOSIS — D123 Benign neoplasm of transverse colon: Secondary | ICD-10-CM

## 2019-02-18 DIAGNOSIS — T4145XA Adverse effect of unspecified anesthetic, initial encounter: Secondary | ICD-10-CM

## 2019-02-18 DIAGNOSIS — Z800 Family history of malignant neoplasm of digestive organs: Secondary | ICD-10-CM | POA: Diagnosis present

## 2019-02-18 DIAGNOSIS — T8859XA Other complications of anesthesia, initial encounter: Secondary | ICD-10-CM

## 2019-02-18 MED ORDER — SODIUM CHLORIDE 0.9 % IV SOLN: 500.0000 mL | Freq: Once | INTRAVENOUS | Status: DC

## 2019-02-18 NOTE — Progress Notes (Signed)
To PACU, VSS. Report to [email or web address] 

## 2019-02-18 NOTE — Patient Instructions (Signed)
Please read handouts provided. Continue present medications. Await pathology results.        YOU HAD AN ENDOSCOPIC PROCEDURE TODAY AT THE [facility]:   Refer to the procedure report that was given to you for any specific questions about what was found during the examination.  If the procedure report does not answer your questions, please call your gastroenterologist to clarify.  If you requested that your care partner not be given the details of your procedure findings, then the procedure report has been included in a sealed envelope for you to review at your convenience later.  YOU SHOULD EXPECT: Some feelings of bloating in the abdomen. Passage of more gas than usual.  Walking can help get rid of the air that was put into your GI tract during the procedure and reduce the bloating. If you had a lower endoscopy (such as a colonoscopy or flexible sigmoidoscopy) you may notice spotting of blood in your stool or on the toilet paper. If you underwent a bowel prep for your procedure, you may not have a normal bowel movement for a few days.  Please Note:  You might notice some irritation and congestion in your nose or some drainage.  This is from the oxygen used during your procedure.  There is no need for concern and it should clear up in a day or so.  SYMPTOMS TO REPORT IMMEDIATELY:   Following lower endoscopy (colonoscopy or flexible sigmoidoscopy):  Excessive amounts of blood in the stool  Significant tenderness or worsening of abdominal pains  Swelling of the abdomen that is new, acute  Fever of 100F or higher    For urgent or emergent issues, a gastroenterologist can be reached at any hour by calling [phone].   DIET:  We do recommend a small meal at first, but then you may proceed to your regular diet.  Drink plenty of fluids but you should avoid alcoholic beverages for 24 hours.  ACTIVITY:  You should plan to take it easy for the rest of today and you should NOT  DRIVE or use heavy machinery until tomorrow (because of the sedation medicines used during the test).    FOLLOW UP: Our staff will call the number listed on your records 48-72 hours following your procedure to check on you and address any questions or concerns that you may have regarding the information given to you following your procedure. If we do not reach you, we will leave a message.  We will attempt to reach you two times.  During this call, we will ask if you have developed any symptoms of COVID 19. If you develop any symptoms (ie: fever, flu-like symptoms, shortness of breath, cough etc.) before then, please call [phone].  If you test positive for Covid 19 in the 2 weeks post procedure, please call and report this information to us.    If any biopsies were taken you will be contacted by phone or by letter within the next 1-3 weeks.  Please call us at [phone] if you have not heard about the biopsies in 3 weeks.    SIGNATURES/CONFIDENTIALITY: You and/or your care partner have signed paperwork which will be entered into your electronic medical record.  These signatures attest to the fact that that the information above on your After Visit Summary has been reviewed and is understood.  Full responsibility of the confidentiality of this discharge information lies with you and/or your care-partner. 

## 2019-02-18 NOTE — Progress Notes (Signed)
Called to room to assist during endoscopic procedure.  Patient ID and intended procedure confirmed with present staff. Received instructions for my participation in the procedure from the performing physician.  

## 2019-02-18 NOTE — Op Note (Addendum)
[facility] Patient Name: Julie [name] Procedure Date: 02/18/2019 1:20 PM MRN: [id number] Endoscopist: Mauri Pole , MD Age: 53 Referring MD:  Date of Birth: 10/29/1965 Gender: Female Account #: [id number] Procedure:                Colonoscopy Indications:              Screening in patient at increased risk: Family                            history of 1st-degree relative with colorectal                            cancer Medicines:                Monitored Anesthesia Care Procedure:                Pre-Anesthesia Assessment:                           - Prior to the procedure, a History and Physical                            was performed, and patient medications and                            allergies were reviewed. The patient's tolerance of                            previous anesthesia was also reviewed. The risks                            and benefits of the procedure and the sedation                            options and risks were discussed with the patient.                            All questions were answered, and informed consent                            was obtained. Prior Anticoagulants: The patient has                            taken no previous anticoagulant or antiplatelet                            agents. ASA Grade Assessment: II - A patient with                            mild systemic disease. After reviewing the risks                            and benefits, the patient was deemed in  satisfactory condition to undergo the procedure.                           After obtaining informed consent, the colonoscope                            was passed under direct vision. Throughout the                            procedure, the patient's blood pressure, pulse, and                            oxygen saturations were monitored continuously. The                            Model PCF-H190DL [id number]) scope was introduced                             through the anus and advanced to the the terminal                            ileum, with identification of the appendiceal                            orifice and IC valve. The colonoscopy was performed                            without difficulty. The patient tolerated the                            procedure well. The quality of the bowel                            preparation was excellent. The ileocecal valve,                            appendiceal orifice, and rectum were photographed. Scope In: 1:51:00 PM Scope Out: 2:11:07 PM Scope Withdrawal Time: 0 hours 14 minutes 44 seconds  Total Procedure Duration: 0 hours 20 minutes 7 seconds  Findings:                 The perianal and digital rectal examinations were                            normal.                           A 5 mm polyp was found in the transverse colon. The                            polyp was sessile. The polyp was removed with a                            cold snare. Resection and retrieval were complete.  A 1 mm polyp was found in the rectum. The polyp was                            sessile. The polyp was removed with a cold biopsy                            forceps. Resection and retrieval were complete.                           A few small and large-mouthed diverticula were                            found in the ascending colon and cecum.                           Non-bleeding internal hemorrhoids were found during                            retroflexion. The hemorrhoids were small. Complications:            No immediate complications. Estimated Blood Loss:     Estimated blood loss was minimal. Impression:               - One 5 mm polyp in the transverse colon, removed                            with a cold snare. Resected and retrieved.                           - One 1 mm polyp in the rectum, removed with a cold                            biopsy forceps.  Resected and retrieved.                           - Diverticulosis in the ascending colon and in the                            cecum.                           - Non-bleeding internal hemorrhoids. Recommendation:           - Patient has a contact number available for                            emergencies. The signs and symptoms of potential                            delayed complications were discussed with the                            patient. Return to normal activities tomorrow.  Written discharge instructions were provided to the                            patient.                           - Resume previous diet.                           - Continue present medications.                           - Await pathology results.                           - Repeat colonoscopy in 5 years for surveillance                            based on pathology results. Mauri Pole, MD 02/18/2019 2:15:37 PM This report has been signed electronically.

## 2019-02-18 NOTE — Progress Notes (Signed)
Pt's states no medical or surgical changes since previsit or office visit.  Temp and Covid 19 questions per C. [place], [place] per Lorrin Goodell

## 2019-02-19 ENCOUNTER — Telehealth: Payer: Self-pay | Admitting: Pharmacist

## 2019-02-19 NOTE — Telephone Encounter (Signed)
Received faxed from [facility] alerting to interaction with methotrexate and diclofenac.  Recommend avoid use with high dose methotrexate and NSAIDS as it can increase risk of methotrexate toxicity.  Patient is not on high dose methotrexate and is using diclofenac gel as needed.  Recommend continue current regimen and will continue to monitor CBC/CMP every 3 months.  Will send document to scan center.  Mariella Saa, PharmD, [facility] Rheumatology Clinical Pharmacist  02/19/2019 8:59 AM

## 2019-02-20 ENCOUNTER — Telehealth: Payer: Self-pay

## 2019-02-20 NOTE — Telephone Encounter (Signed)
Attempted to reach patient for post-procedure f/u call. No answer. Left message that we will make another attempt to call her later today and for her to please not hesitate to call us if she has any questions/concerns regarding her care. 

## 2019-02-20 NOTE — Telephone Encounter (Signed)
  Follow up Call-  Call back number 02/18/2019  Post procedure Call Back phone  # [phone]  Permission to leave phone message Yes  Some recent data might be hidden     Patient questions:  Do you have a fever, pain , or abdominal swelling? No. Pain Score  0 *  Have you tolerated food without any problems? Yes.    Have you been able to return to your normal activities? Yes.    Do you have any questions about your discharge instructions: Diet   No. Medications  No. Follow up visit  No.  Do you have questions or concerns about your Care? No.  Actions: * If pain score is 4 or above: No action needed, pain <4.  1. Have you developed a fever since your procedure? no  2.   Have you had an respiratory symptoms (SOB or cough) since your procedure? no  3.   Have you tested positive for COVID 19 since your procedure no  4.   Have you had any family members/close contacts diagnosed with the COVID 19 since your procedure?  no   If yes to any of these questions please route to Joylene John, RN and Alphonsa Gin, Therapist, sports.

## 2019-02-27 ENCOUNTER — Encounter: Payer: Self-pay | Admitting: Gastroenterology

## 2019-03-02 ENCOUNTER — Telehealth: Payer: Self-pay | Admitting: Gastroenterology

## 2019-03-02 NOTE — Telephone Encounter (Signed)
Biopsy letter read to the patient. It was mailed on 02/27/19. She has not received it yet.

## 2019-03-02 NOTE — Telephone Encounter (Signed)
Patient called would like to know if her colon results are back she had a procedure on 6-3 and has not heard back

## 2019-03-03 ENCOUNTER — Other Ambulatory Visit: Payer: Self-pay | Admitting: Family

## 2019-03-09 ENCOUNTER — Other Ambulatory Visit: Payer: Self-pay | Admitting: Nurse Practitioner

## 2019-03-09 DIAGNOSIS — N644 Mastodynia: Secondary | ICD-10-CM

## 2019-03-16 ENCOUNTER — Other Ambulatory Visit: Payer: Self-pay | Admitting: Nurse Practitioner

## 2019-03-16 DIAGNOSIS — N644 Mastodynia: Secondary | ICD-10-CM

## 2019-03-24 ENCOUNTER — Ambulatory Visit
Admission: RE | Admit: 2019-03-24 | Discharge: 2019-03-24 | Disposition: A | Discharge status: Home / Self Care | Payer: 59 | Source: Ambulatory Visit | Attending: Nurse Practitioner | Admitting: Nurse Practitioner

## 2019-03-24 ENCOUNTER — Ambulatory Visit: Payer: 59

## 2019-03-24 ENCOUNTER — Other Ambulatory Visit: Payer: Self-pay

## 2019-03-24 DIAGNOSIS — N644 Mastodynia: Secondary | ICD-10-CM

## 2019-05-05 ENCOUNTER — Encounter: Payer: Self-pay | Admitting: Physician Assistant

## 2019-06-17 ENCOUNTER — Other Ambulatory Visit: Payer: Self-pay

## 2019-06-17 ENCOUNTER — Other Ambulatory Visit: Payer: Self-pay | Admitting: Physician Assistant

## 2019-06-17 DIAGNOSIS — Z79899 Other long term (current) drug therapy: Secondary | ICD-10-CM

## 2019-06-17 NOTE — Telephone Encounter (Signed)
Last Visit: 02/04/19 Next Visit: 07/08/19 Labs: 02/04/19 WNL  Left message to advise patient to advise patient she is due to update labs.  Okay to refill 30 day supply MTX?

## 2019-06-17 NOTE — Telephone Encounter (Signed)
If she is updating lab work today the results should be back tomorrow.  If labs are stable we can refill a 3 month supply tomorrow.

## 2019-06-18 LAB — COMPLETE METABOLIC PANEL WITH GFR
AG Ratio: 1.3 (calc) (ref 1.0–2.5)
ALT: 13 U/L (ref 6–29)
AST: 15 U/L (ref 10–35)
Albumin: 4.1 g/dL (ref 3.6–5.1)
Alkaline phosphatase (APISO): 77 U/L (ref 37–153)
BUN: 14 mg/dL (ref 7–25)
CO2: 28 mmol/L (ref 20–32)
Calcium: 9.7 mg/dL (ref 8.6–10.4)
Chloride: 103 mmol/L (ref 98–110)
Creat: 0.71 mg/dL (ref 0.50–1.05)
GFR, Est African American: 115 mL/min/{1.73_m2} (ref >=60)
GFR, Est Non African American: 99 mL/min/{1.73_m2} (ref >=60)
Globulin: 3.1 g/dL (calc) (ref 1.9–3.7)
Glucose, Bld: 97 mg/dL (ref 65–99)
Potassium: 4.6 mmol/L (ref 3.5–5.3)
Sodium: 137 mmol/L (ref 135–146)
Total Bilirubin: 0.3 mg/dL (ref 0.2–1.2)
Total Protein: 7.2 g/dL (ref 6.1–8.1)

## 2019-06-18 LAB — CBC WITH DIFFERENTIAL/PLATELET
Absolute Monocytes: 510 cells/uL (ref 200–950)
Basophils Absolute: 52 cells/uL (ref 0–200)
Basophils Relative: 1 %
Eosinophils Absolute: 151 cells/uL (ref 15–500)
Eosinophils Relative: 2.9 %
HCT: 37 % (ref 35.0–45.0)
Hemoglobin: 12.3 g/dL (ref 11.7–15.5)
Lymphs Abs: 1825 cells/uL (ref 850–3900)
MCH: 28.6 pg (ref 27.0–33.0)
MCHC: 33.2 g/dL (ref 32.0–36.0)
MCV: 86 fL (ref 80.0–100.0)
MPV: 11.4 fL (ref 7.5–12.5)
Monocytes Relative: 9.8 %
Neutro Abs: 2662 cells/uL (ref 1500–7800)
Neutrophils Relative %: 51.2 %
Platelets: 240 10*3/uL (ref 140–400)
RBC: 4.3 10*6/uL (ref 3.80–5.10)
RDW: 11.9 % (ref 11.0–15.0)
Total Lymphocyte: 35.1 %
WBC: 5.2 10*3/uL (ref 3.8–10.8)

## 2019-06-18 NOTE — Progress Notes (Signed)
Within normal limits

## 2019-06-18 NOTE — Telephone Encounter (Signed)
Received labs that were drawn on 06/17/19 and they are WNL

## 2019-06-19 ENCOUNTER — Other Ambulatory Visit: Payer: Self-pay | Admitting: Physician Assistant

## 2019-06-19 NOTE — Telephone Encounter (Signed)
Last Visit: 02/04/19 Next Visit: 07/08/19 Labs: 06/17/19 Eye exam: 02/12/18   Patient advised she is due to update her PLQ eye exam. Patient states she is scheduled this month to update.   Okay to refill 30 day supply per Dr. Estanislado Pandy

## 2019-06-24 NOTE — Progress Notes (Signed)
Office Visit Note  Patient: Julie [name]             Date of Birth: [birth year]           MRN: [id number]             PCP: Suella Broad, FNP Referring: Suella Broad,* Visit Date: 07/08/2019 Occupation: @GUAROCC @  Subjective:  Medication monitoring   History of Present Illness: Julie Martrice Jablonowski is a 53 y.o. female with history of seropositive rheumatoid arthritis and fibromyalgia.  She is on MTX 1 ml sq injections every 7 days, folic acid 2 mg po daily, and Plaquenil 200 mg 1 tablet by mouth twice daily.  She denies any recent rheumatoid arthritis flares.  She states she has been under increased stress and has had worsening anxiety.  She has been taking lexapro and buspar as prescribed.  She has not had any worsening symptoms since being under increased stress. She denies any joint pain or joint swelling.  She denies any morning stiffness.    Activities of Daily Living:  Patient reports morning stiffness for 0 minutes.   Patient Denies nocturnal pain.  Difficulty dressing/grooming: Denies Difficulty climbing stairs: Denies Difficulty getting out of chair: Denies Difficulty using hands for taps, buttons, cutlery, and/or writing: Denies  Review of Systems  Constitutional: Positive for fatigue.  HENT: Negative for mouth sores, mouth dryness and nose dryness.   Eyes: Negative for pain, visual disturbance and dryness.  Respiratory: Negative for cough, hemoptysis, shortness of breath and difficulty breathing.   Cardiovascular: Negative for chest pain, palpitations, hypertension and swelling in legs/feet.  Gastrointestinal: Negative for blood in stool, constipation and diarrhea.  Endocrine: Negative for increased urination.  Genitourinary: Negative for painful urination.  Musculoskeletal: Negative for arthralgias, joint pain, joint swelling, myalgias, muscle weakness, morning stiffness, muscle tenderness and myalgias.  Skin: Negative for color  change, pallor, rash, hair loss, nodules/bumps, skin tightness, ulcers and sensitivity to sunlight.  Allergic/Immunologic: Negative for susceptible to infections.  Neurological: Negative for dizziness, numbness, headaches and weakness.  Hematological: Negative for swollen glands.  Psychiatric/Behavioral: Positive for sleep disturbance. Negative for depressed mood. The patient is not nervous/anxious.     PMFS History:  Patient Active Problem List   Diagnosis Date Noted  . Complication of anesthesia   . Other allergic rhinitis 10/10/2018  . Fibromyalgia 02/14/2018  . History of gastroesophageal reflux (GERD) 02/14/2018  . History of asthma 02/14/2018  . Rheumatoid arthritis involving multiple sites with positive rheumatoid factor ([place]) 11/05/2016  . High risk medication use 11/05/2016  . LPRD (laryngopharyngeal reflux disease) 09/20/2015  . Mild persistent asthma 09/20/2015  . Allergic rhinoconjunctivitis 09/20/2015  . Acute sinusitis 07/29/2015  . Asthma with acute exacerbation 07/29/2015  . S/P total hysterectomy 05/25/2015    Past Medical History:  Diagnosis Date  . Anemia   . Asthma   . Complication of anesthesia   . Eczema   . GERD (gastroesophageal reflux disease)   . PONV (postoperative nausea and vomiting)   . Rheumatoid arthritis ([place])     Family History  Problem Relation Age of Onset  . Asthma Mother   . Eczema Mother   . Sarcoidosis Mother   . Hypertension Mother   . Cancer Father   . Hypotension Father   . Congestive Heart Failure Father   . Colon cancer Father   . Asthma Sister   . GER disease Sister   . Allergic rhinitis Neg Hx   . Angioedema Neg Hx   .  Immunodeficiency Neg Hx   . Urticaria Neg Hx   . Esophageal cancer Neg Hx   . Rectal cancer Neg Hx   . Stomach cancer Neg Hx    Past Surgical History:  Procedure Laterality Date  . ABDOMINAL HYSTERECTOMY     partial age 71  . BILATERAL SALPINGECTOMY Bilateral 05/25/2015   Procedure: BILATERAL  SALPINGECTOMY;  Surgeon: Servando Salina, MD;  Location: [facility];  Service: Gynecology;  Laterality: Bilateral;  . COLONOSCOPY    . OVARIAN CYST REMOVAL Right 05/25/2015   Procedure: OVARIAN CYSTECTOMY;  Surgeon: Servando Salina, MD;  Location: [facility];  Service: Gynecology;  Laterality: Right;  . TONSILLECTOMY    . TUBAL LIGATION     Social History   Social History Narrative  . Not on file   Immunization History  Administered Date(s) Administered  . Influenza Inj Mdck Quad Pf 10/19/2017  . Influenza, Seasonal, Injecte, Preservative Fre 09/20/2015     Objective: Vital Signs: BP 132/87 (BP Location: Right Arm, Patient Position: Sitting, Cuff Size: Normal)   Pulse 75   Resp 14   Ht 5' 1.25" (1.556 m)   Wt 193 lb (87.5 kg)   LMP 05/19/2015   BMI 36.17 kg/m    Physical Exam Vitals signs and nursing note reviewed.  Constitutional:      Appearance: She is well-developed.  HENT:     Head: Normocephalic and atraumatic.  Eyes:     Conjunctiva/sclera: Conjunctivae normal.  Neck:     Musculoskeletal: Normal range of motion.  Cardiovascular:     Rate and Rhythm: Normal rate and regular rhythm.     Heart sounds: Normal heart sounds.  Pulmonary:     Effort: Pulmonary effort is normal.     Breath sounds: Normal breath sounds.  Abdominal:     General: Bowel sounds are normal.     Palpations: Abdomen is soft.  Lymphadenopathy:     Cervical: No cervical adenopathy.  Skin:    General: Skin is warm and dry.     Capillary Refill: Capillary refill takes less than 2 seconds.  Neurological:     Mental Status: She is alert and oriented to person, place, and time.  Psychiatric:        Behavior: Behavior normal.      Musculoskeletal Exam: C-spine, thoracic spine, and lumbar spine good ROM.  No midline spinal tenderness.  No SI joint tenderness.  Shoulder joints, elbow joints, wrist joints, MCPs, PIPs, and DIPs good ROM with no synovitis.  Hip joints, knee joints, ankle joints, MTPs,  PIPs, and DIPs good ROM with no synovitis.  No warmth or effusion of knee joints.  No tenderness or swelling of ankle joints.   CDAI Exam: CDAI Score: 0.2  Patient Global: 1 mm; Provider Global: 1 mm Swollen: 0 ; Tender: 0  Joint Exam   No joint exam has been documented for this visit   There is currently no information documented on the homunculus. Go to the Rheumatology activity and complete the homunculus joint exam.  Investigation: No additional findings.  Imaging: No results found.  Recent Labs: Lab Results  Component Value Date   WBC 5.2 06/17/2019   HGB 12.3 06/17/2019   PLT 240 06/17/2019   NA 137 06/17/2019   K 4.6 06/17/2019   CL 103 06/17/2019   CO2 28 06/17/2019   GLUCOSE 97 06/17/2019   BUN 14 06/17/2019   CREATININE 0.71 06/17/2019   BILITOT 0.3 06/17/2019   ALKPHOS 55 03/26/2017   AST  15 06/17/2019   ALT 13 06/17/2019   PROT 7.2 06/17/2019   ALBUMIN 3.7 03/26/2017   CALCIUM 9.7 06/17/2019   GFRAA 115 06/17/2019    Speciality Comments: PLQ Eye Exam: 02/12/18 WNL at [facility] Follow up in 1 year  Procedures:  No procedures performed Allergies: Aspirin and Augmentin [amoxicillin-pot clavulanate]   Assessment / Plan:     Visit Diagnoses: Rheumatoid arthritis involving multiple sites with positive rheumatoid factor ([place]) - +RF, +CCP: She has no synovitis on exam.  She is clinically doing well on MTX 1.0 ml sq once weekly, folic acid 2 mg po daily, and Plaquenil 200 mg 1 tablet by mouth twice daily.  She has no joint pain or joint swelling.  She has no morning stiffness.   She will continue on the current treatment regimen. She does not need any refills at this time.  She was advised to notify us if she develops increased joint pain or joint swelling.  She will follow up in 5 months.   High risk medication use - Methotrexate 1 mg every 7 days, folic acid 2 mg daily, and Plaquenil 200 mg 1 tablet twice daily.  Last Plaquenil eye exam normal on 02/12/2018.   She has an upcoming PLQ eye exam scheduled for 07/23/19.  She was given a PLQ eye exam form to take with her to her next appointment.  Most recent CBC/CMP within normal limits on 06/17/2019 and will monitor every 3 months.  Fibromyalgia: She has not had any recent fibromyalgia flares.  Her muscle aches and muscle tenderness have been manageable.  She has been experiencing increased anxiety and depression recently.  She takes lexapro as prescribed and was recently started on buspar TID prn for anxiety.    Trigger middle finger of right hand - Resolved  History of depression: She is taking Lexapro as prescribed.   History of gastroesophageal reflux (GERD)  History of asthma  Orders: No orders of the defined types were placed in this encounter.  No orders of the defined types were placed in this encounter.     Follow-Up Instructions: Return in about 5 months (around 12/06/2019) for Rheumatoid arthritis, Fibromyalgia.   Ofilia Neas, PA-C  Note - This record has been created using Dragon software.  Chart creation errors have been sought, but may not always  have been located. Such creation errors do not reflect on  the standard of medical care.

## 2019-07-08 ENCOUNTER — Other Ambulatory Visit: Payer: Self-pay

## 2019-07-08 ENCOUNTER — Encounter: Payer: Self-pay | Admitting: Physician Assistant

## 2019-07-08 ENCOUNTER — Ambulatory Visit: Payer: 59 | Admitting: Physician Assistant

## 2019-07-08 VITALS — BP 132/87 | HR 75 | Resp 14 | Ht 61.25 in | Wt 193.0 lb

## 2019-07-08 DIAGNOSIS — M65331 Trigger finger, right middle finger: Secondary | ICD-10-CM

## 2019-07-08 DIAGNOSIS — Z79899 Other long term (current) drug therapy: Secondary | ICD-10-CM | POA: Diagnosis not present

## 2019-07-08 DIAGNOSIS — M0579 Rheumatoid arthritis with rheumatoid factor of multiple sites without organ or systems involvement: Secondary | ICD-10-CM | POA: Diagnosis not present

## 2019-07-08 DIAGNOSIS — M797 Fibromyalgia: Secondary | ICD-10-CM | POA: Diagnosis not present

## 2019-07-08 DIAGNOSIS — Z8709 Personal history of other diseases of the respiratory system: Secondary | ICD-10-CM

## 2019-07-08 DIAGNOSIS — Z8659 Personal history of other mental and behavioral disorders: Secondary | ICD-10-CM

## 2019-07-08 DIAGNOSIS — Z8719 Personal history of other diseases of the digestive system: Secondary | ICD-10-CM

## 2019-07-08 NOTE — Patient Instructions (Signed)
Standing Labs We placed an order today for your standing lab work.    Please come back and get your standing labs in December and every 3 months   We have open lab daily Monday through Thursday from 8:30-12:30 PM and 1:30-4:30 PM and Friday from 8:30-12:30 PM and 1:30-4:00 PM at the office of Dr. Shaili Deveshwar.   You may experience shorter wait times on Monday and Friday afternoons. The office is located at [address], [address], [place], [place] [address] No appointment is necessary.   Labs are drawn by [facility].  You may receive a bill from [facility] for your lab work.  If you wish to have your labs drawn at another location, please call the office 24 hours in advance to send orders.  If you have any questions regarding directions or hours of operation,  please call [phone].   Just as a reminder please drink plenty of water prior to coming for your lab work. Thanks!   

## 2019-07-29 ENCOUNTER — Other Ambulatory Visit: Payer: Self-pay | Admitting: Allergy & Immunology

## 2019-07-29 MED ORDER — DEXILANT 60 MG PO CPDR: DELAYED_RELEASE_CAPSULE | ORAL | 0 refills | Status: DC

## 2019-07-29 MED ORDER — FAMOTIDINE 40 MG PO TABS: 40.0000 mg | ORAL_TABLET | Freq: Every day | ORAL | 0 refills | Status: DC

## 2019-07-29 NOTE — Telephone Encounter (Signed)
Refills sent as a courtesy refill, an office visit is required for further refills

## 2019-07-29 NOTE — Telephone Encounter (Signed)
Pt called and said she needed to have Famotidine 40mg  and Dexilant called into [facility] [address]. [phone].

## 2019-08-06 ENCOUNTER — Other Ambulatory Visit: Payer: Self-pay

## 2019-08-06 DIAGNOSIS — Z20822 Contact with and (suspected) exposure to covid-19: Secondary | ICD-10-CM

## 2019-08-08 LAB — NOVEL CORONAVIRUS, NAA: SARS-CoV-2, NAA: NOT DETECTED

## 2019-08-11 ENCOUNTER — Telehealth: Payer: Self-pay | Admitting: Rheumatology

## 2019-08-11 DIAGNOSIS — Z13820 Encounter for screening for osteoporosis: Secondary | ICD-10-CM

## 2019-08-11 NOTE — Telephone Encounter (Signed)
Patient states bone density was discussed at her office visit on 07/08/2019. Patient would like to proceed with a bone density scan. Okay to place order?

## 2019-08-11 NOTE — Telephone Encounter (Signed)
Patient left a voicemail requesting a return call to discuss scheduling a bone density scan.

## 2019-08-12 NOTE — Telephone Encounter (Signed)
Ok to place order for DEXA at [facility]

## 2019-08-12 NOTE — Telephone Encounter (Signed)
DEXA placed for osteoporosis screening. Patient is aware.

## 2019-08-19 ENCOUNTER — Telehealth: Payer: Self-pay | Admitting: Rheumatology

## 2019-08-19 MED ORDER — PREDNISONE 5 MG PO TABS: ORAL_TABLET | ORAL | 0 refills | Status: DC

## 2019-08-19 NOTE — Telephone Encounter (Signed)
Patient states she is having pain in bilateral shoulders, hands and feet. Patient states the pain is "moving from place to place." patient denies joint swelling. Patient states the cold weather is making the pain worse and she has not gotten relief from a heating pad or tylenol. Patient has been taking PLQ and MTX on schedule and has not missed any doses. Patient is requesting a prednisone taper. Please advise.

## 2019-08-19 NOTE — Telephone Encounter (Signed)
Prescription has been sent to the pharmacy, patient is aware.

## 2019-08-19 NOTE — Telephone Encounter (Signed)
Okay to give prednisone taper starting at 20 mg and taper by 5 mg every 2 days.

## 2019-08-19 NOTE — Telephone Encounter (Signed)
Patient having a lot of pain with shoulders, hands, and feet. Pain started this week really bad with the cold weather. Heating pad, and Tylenol not helping. Patient request an rx for Prednisone to be sent into [facility] on [address]. Please call to advise.

## 2019-08-24 ENCOUNTER — Encounter: Payer: Self-pay | Admitting: Physician Assistant

## 2019-08-26 ENCOUNTER — Telehealth: Payer: Self-pay

## 2019-08-26 NOTE — Telephone Encounter (Signed)
DEXA 08/24/2019  T-score: -1.3 BMD: 0.909  Osteopenia- Dr. Estanislado Pandy advises calcium, vitamin D supplement, and exercise.  Repeat DEXA in 2 years.   Advised patient of results, patient requested it be sent in a MyChart message since she is working. I have sent message to patient.

## 2019-08-29 ENCOUNTER — Other Ambulatory Visit: Payer: Self-pay | Admitting: Rheumatology

## 2019-08-29 DIAGNOSIS — M0579 Rheumatoid arthritis with rheumatoid factor of multiple sites without organ or systems involvement: Secondary | ICD-10-CM

## 2019-08-31 NOTE — Telephone Encounter (Addendum)
Last Visit: 07/08/2019 Next Visit: 12/09/2019 Labs: 06/17/2019 Within normal limits Eye exam: 05/05/2019  Okay to refill per Dr. Estanislado Pandy.

## 2019-09-08 ENCOUNTER — Telehealth: Payer: Self-pay | Admitting: Rheumatology

## 2019-09-08 NOTE — Telephone Encounter (Signed)
Patient left a voicemail stating she takes Methotrexate once a week.  Patient states her injection site is really irritated, red, puffy, and warm to touch.  Patient is requesting a return call.

## 2019-09-08 NOTE — Telephone Encounter (Signed)
Returned patient call.  States she last injected methotrexate on 12/14.  The injection site became red, swollen, and warm to the touch.  It is no longer swollen or warm to the touch but still red.  She denies any drainage or pus.    Advised that it does not sound infected.  Advised that sometimes you can have a mild reaction.  She may have injected into an scar tissue.  She states she did inject in stretch mark.    Advised patient that it is okay for her to take her next dose.  Instructed patient to aovid scar tissue, pinch the skin, and maybe administer in upper thigh vs the stomach.  Patient verbalized understanding.    Instructed patient to call if she has any issues after her next injection.   Mariella Saa, PharmD, [place], [place] [facility] Pharmacist [phone]  09/08/2019 4:54 PM

## 2019-10-27 ENCOUNTER — Telehealth: Payer: Self-pay | Admitting: Rheumatology

## 2019-10-27 NOTE — Telephone Encounter (Signed)
Patient schedule for a visit on 10/29/19.

## 2019-10-27 NOTE — Telephone Encounter (Signed)
Please schedule office visit for further evaluation.

## 2019-10-27 NOTE — Telephone Encounter (Signed)
Patient states she is having numbness in left hand. Patient noticed it about 1 month ago. Patient states it gets worse in her sleep. Patient states it also gets worse with use. Patient states nothing makes it better. Patient her hand is like a pins and needles felling across her hand except from her pinky to her wrist. Patient does not recall an injury. Patient is on PLQ 200 mg BID. Patient is on MTX 1.0 mL weekly. Patient denies missing any doses of PLQ and one dose of MTX.Marland Kitchen Please advise.

## 2019-10-27 NOTE — Telephone Encounter (Signed)
Patient has experiencing numbness in her left hand all the fingers, and in middle of hand. No known injury. Pins and needles all day, and gets worse when patient sleeps. Please call to advise.

## 2019-10-28 NOTE — Progress Notes (Signed)
Office Visit Note  Patient: Julie [name]             Date of Birth: 02-18-1966           MRN: [id number]             PCP: Suella Broad, FNP Referring: Suella Broad,* Visit Date: 10/29/2019 Occupation: @GUAROCC @  Subjective:  Left hand paresthesias   History of Present Illness: Julie [name] is a 54 y.o. female seropositive rheumatoid arthritis and fibromyalgia.  She is on methotrexate 1.0 mL once weekly, folic acid 2 mg a mouth daily, Plaquenil 200 mg 1 tablet by mouth twice daily.  She denies any recent rheumatoid arthritis flares.  She denies any joint pain or joint swelling at this time.  She states that for the past 1 month she has been experiencing paresthesias in the left hand.  She has also been experiencing nocturnal symptoms.  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  She denies any other overuse activities.   Activities of Daily Living:  Patient reports morning stiffness for 0 none.   Patient Reports nocturnal pain.  Difficulty dressing/grooming: Denies Difficulty climbing stairs: Denies Difficulty getting out of chair: Denies Difficulty using hands for taps, buttons, cutlery, and/or writing: Reports  Review of Systems  Constitutional: Positive for fatigue.  HENT: Positive for mouth dryness. Negative for mouth sores and nose dryness.   Eyes: Positive for dryness. Negative for pain.  Respiratory: Negative for cough, hemoptysis, shortness of breath and difficulty breathing.   Cardiovascular: Negative for chest pain, palpitations, hypertension and swelling in legs/feet.  Gastrointestinal: Positive for constipation and diarrhea. Negative for blood in stool.  Endocrine: Negative for increased urination.  Genitourinary: Negative for difficulty urinating and painful urination.  Musculoskeletal: Negative for arthralgias, joint pain, joint swelling, myalgias, muscle weakness, morning  stiffness, muscle tenderness and myalgias.  Skin: Negative for color change, pallor, rash, hair loss, nodules/bumps, skin tightness, ulcers and sensitivity to sunlight.  Allergic/Immunologic: Negative for susceptible to infections.  Neurological: Positive for parasthesias (Left hand). Negative for dizziness and headaches.  Hematological: Negative for swollen glands.  Psychiatric/Behavioral: Positive for sleep disturbance. Negative for depressed mood. The patient is not nervous/anxious.     PMFS History:  Patient Active Problem List   Diagnosis Date Noted   Complication of anesthesia    Other allergic rhinitis 10/10/2018   Fibromyalgia 02/14/2018   History of gastroesophageal reflux (GERD) 02/14/2018   History of asthma 02/14/2018   Rheumatoid arthritis involving multiple sites with positive rheumatoid factor ([place]) 11/05/2016   High risk medication use 11/05/2016   LPRD (laryngopharyngeal reflux disease) 09/20/2015   Mild persistent asthma 09/20/2015   Allergic rhinoconjunctivitis 09/20/2015   Acute sinusitis 07/29/2015   Asthma with acute exacerbation 07/29/2015   S/P total hysterectomy 05/25/2015    Past Medical History:  Diagnosis Date   Anemia    Asthma    Complication of anesthesia    Eczema    GERD (gastroesophageal reflux disease)    PONV (postoperative nausea and vomiting)    Rheumatoid arthritis ([place])     Family History  Problem Relation Age of Onset   Asthma Mother    Eczema Mother    Sarcoidosis Mother    Hypertension Mother    Cancer Father    Hypotension Father    Congestive Heart Failure Father    Colon cancer Father    Asthma Sister  GER disease Sister    Allergic rhinitis Neg Hx    Angioedema Neg Hx    Immunodeficiency Neg Hx    Urticaria Neg Hx    Esophageal cancer Neg Hx    Rectal cancer Neg Hx    Stomach cancer Neg Hx    Past Surgical History:  Procedure Laterality Date   ABDOMINAL HYSTERECTOMY      partial age 33   BILATERAL SALPINGECTOMY Bilateral 05/25/2015   Procedure: BILATERAL SALPINGECTOMY;  Surgeon: Servando Salina, MD;  Location: [facility];  Service: Gynecology;  Laterality: Bilateral;   COLONOSCOPY     OVARIAN CYST REMOVAL Right 05/25/2015   Procedure: OVARIAN CYSTECTOMY;  Surgeon: Servando Salina, MD;  Location: [facility];  Service: Gynecology;  Laterality: Right;   TONSILLECTOMY     TUBAL LIGATION     Social History   Social History Narrative   Not on file   Immunization History  Administered Date(s) Administered   Influenza Inj Mdck Quad Pf 10/19/2017   Influenza, Seasonal, Injecte, Preservative Fre 09/20/2015     Objective: Vital Signs: BP (!) 132/92 (BP Location: Right Arm, Patient Position: Sitting, Cuff Size: Normal)    Pulse 79    Resp 16    Ht 5' 1.25" (1.556 m)    Wt 195 lb 9.6 oz (88.7 kg)    LMP 05/19/2015    BMI 36.66 kg/m    Physical Exam Vitals and nursing note reviewed.  Constitutional:      Appearance: She is well-developed.  HENT:     Head: Normocephalic and atraumatic.  Eyes:     Conjunctiva/sclera: Conjunctivae normal.  Pulmonary:     Effort: Pulmonary effort is normal.  Abdominal:     General: Bowel sounds are normal.     Palpations: Abdomen is soft.  Musculoskeletal:     Cervical back: Normal range of motion.  Lymphadenopathy:     Cervical: No cervical adenopathy.  Skin:    General: Skin is warm and dry.     Capillary Refill: Capillary refill takes less than 2 seconds.  Neurological:     Mental Status: She is alert and oriented to person, place, and time.  Psychiatric:        Behavior: Behavior normal.      Musculoskeletal Exam: C-spine, thoracic spine, lumbar spine good range of motion.  No midline spinal tenderness.  No SI joint tenderness.  Shoulder joints, elbow joints, wrist joints, MCPs, PIPs and DIPs good range of motion with no synovitis.  Positive Tinel's and Phalen's of left wrist.   She has complete fist formation  bilaterally.  Hip joints, knee joints ankle joints and MTPs and PIPs and DIPs good range of motion no synovitis.  No warmth or effusion of bilateral knee joints.  No tenderness or swelling of ankle joints.  CDAI Exam: CDAI Score: 0.6  Patient Global: 3 mm; Provider Global: 3 mm Swollen: 0 ; Tender: 0  Joint Exam 10/29/2019   No joint exam has been documented for this visit   There is currently no information documented on the homunculus. Go to the Rheumatology activity and complete the homunculus joint exam.  Investigation: No additional findings.  Imaging: No results found.  Recent Labs: Lab Results  Component Value Date   WBC 5.2 06/17/2019   HGB 12.3 06/17/2019   PLT 240 06/17/2019   NA 137 06/17/2019   K 4.6 06/17/2019   CL 103 06/17/2019   CO2 28 06/17/2019   GLUCOSE 97 06/17/2019   BUN 14  06/17/2019   CREATININE 0.71 06/17/2019   BILITOT 0.3 06/17/2019   ALKPHOS 55 03/26/2017   AST 15 06/17/2019   ALT 13 06/17/2019   PROT 7.2 06/17/2019   ALBUMIN 3.7 03/26/2017   CALCIUM 9.7 06/17/2019   GFRAA 115 06/17/2019    Speciality Comments: PLQ Eye Exam: 05/05/2019 WNL at [facility] Follow up in 1 year  Procedures:  No procedures performed Allergies: Aspirin and Augmentin [amoxicillin-pot clavulanate]    Assessment / Plan:     Visit Diagnoses: Rheumatoid arthritis involving multiple sites with positive rheumatoid factor ([place]) - +RF, +CCP: She has no synovitis on exam.  She has not had any recent rheumatoid arthritis flares.  She is clinically doing well on methotrexate 1 mL sq once weekly, folic acid 2 mg by mouth daily, Plaquenil 200 mg 1 tablet twice daily.  She has not missed any doses of these medications recently.  She has no joint pain or joint swelling at this time.  She will continue on the current treatment regimen.  She was advised to notify us if she develops increased joint pain or joint swelling.  She will follow-up in the office in 5 months.  High risk  medication use - Methotrexate 1.0 ml sq injections every 7 days, folic acid 2 mg daily, and Plaquenil 200 mg 1 tablet twice daily.PLQ Eye Exam: 05/05/2019 WNL at [facility] Follow up in 1 year.  CBC and CMP were within normal limits on 06/17/2019.  She is due to update lab work today.  CBC and CMP are released.  She will follow-up for lab work in May and every 3 months to monitor for drug toxicity. - Plan: COMPLETE METABOLIC PANEL WITH GFR, CBC with Differential/Platelet  Fibromyalgia: She continues have generalized muscle aches muscle tenderness due to fibromyalgia.  Her symptoms have been manageable recently.  She continues to have chronic fatigue secondary to insomnia.  We discussed importance of regular exercise and good sleep hygiene.  Trigger middle finger of right hand: Resolved  Left hand paresthesia: She has been experiencing left hand paresthesias for the past 1 month.  Her symptoms have progressively been getting worse.  She is experiencing numbness in the distribution of the median nerve innervation.  Positive Tinel's and positive Phalen's on exam.  She has no tenderness or inflammation of the left wrist joint on exam.  She has good range of motion of the left wrist.  She has been experiencing nocturnal symptoms.  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  She denies any other overuse activities recently.  We discussed the use of Voltaren gel topically as well as using a carpal tunnel splint.  We will refer her to Dr. Ernestina Patches for a nerve conduction study for further evaluation.  Osteopenia of spine - DEXA on 08/24/19: AP spine BMD 0.090 with T-score -1.3.   Other medical conditions are listed as follows:   History of depression  History of gastroesophageal reflux (GERD)  History of asthma  Orders: Orders Placed This Encounter  Procedures   COMPLETE METABOLIC PANEL WITH GFR   CBC with Differential/Platelet   Ambulatory referral to Physical Medicine Rehab    Meds ordered this encounter  Medications   diclofenac Sodium (VOLTAREN) 1 % GEL    Sig: Apply 2g to 4g to affected area up to 4 times daily as needed.    Dispense:  400 g    Refill:  4    Face-to-face time spent with patient was  30 minutes. Greater than 50% of time was spent in counseling and coordination of care.  Follow-Up Instructions: Return in about 5 months (around 03/27/2020) for Rheumatoid arthritis, Osteoarthritis.   Ofilia Neas, PA-C   I examined and evaluated the patient with Julie Sams PA.  Patient is doing well.  I did not see any synovitis on examination.  The plan of care was discussed as noted above.  Bo Merino, MD  Note - This record has been created using Editor, commissioning.  Chart creation errors have been sought, but may not always  have been located. Such creation errors do not reflect on  the standard of medical care.

## 2019-10-29 ENCOUNTER — Other Ambulatory Visit: Payer: Self-pay

## 2019-10-29 ENCOUNTER — Encounter: Payer: Self-pay | Admitting: Rheumatology

## 2019-10-29 ENCOUNTER — Ambulatory Visit: Payer: 59 | Admitting: Rheumatology

## 2019-10-29 VITALS — BP 132/92 | HR 79 | Resp 16 | Ht 61.25 in | Wt 195.6 lb

## 2019-10-29 DIAGNOSIS — M797 Fibromyalgia: Secondary | ICD-10-CM | POA: Diagnosis not present

## 2019-10-29 DIAGNOSIS — Z8719 Personal history of other diseases of the digestive system: Secondary | ICD-10-CM

## 2019-10-29 DIAGNOSIS — M8588 Other specified disorders of bone density and structure, other site: Secondary | ICD-10-CM

## 2019-10-29 DIAGNOSIS — Z8659 Personal history of other mental and behavioral disorders: Secondary | ICD-10-CM

## 2019-10-29 DIAGNOSIS — M65331 Trigger finger, right middle finger: Secondary | ICD-10-CM

## 2019-10-29 DIAGNOSIS — M0579 Rheumatoid arthritis with rheumatoid factor of multiple sites without organ or systems involvement: Secondary | ICD-10-CM

## 2019-10-29 DIAGNOSIS — Z8709 Personal history of other diseases of the respiratory system: Secondary | ICD-10-CM

## 2019-10-29 DIAGNOSIS — Z79899 Other long term (current) drug therapy: Secondary | ICD-10-CM | POA: Diagnosis not present

## 2019-10-29 DIAGNOSIS — R202 Paresthesia of skin: Secondary | ICD-10-CM

## 2019-10-29 LAB — CBC WITH DIFFERENTIAL/PLATELET
Absolute Monocytes: 413 cells/uL (ref 200–950)
Basophils Absolute: 51 cells/uL (ref 0–200)
Basophils Relative: 1 %
Eosinophils Absolute: 168 cells/uL (ref 15–500)
Eosinophils Relative: 3.3 %
HCT: 38.3 % (ref 35.0–45.0)
Hemoglobin: 13.1 g/dL (ref 11.7–15.5)
Lymphs Abs: 1938 cells/uL (ref 850–3900)
MCH: 28.9 pg (ref 27.0–33.0)
MCHC: 34.2 g/dL (ref 32.0–36.0)
MCV: 84.5 fL (ref 80.0–100.0)
MPV: 11.4 fL (ref 7.5–12.5)
Monocytes Relative: 8.1 %
Neutro Abs: 2530 cells/uL (ref 1500–7800)
Neutrophils Relative %: 49.6 %
Platelets: 267 10*3/uL (ref 140–400)
RBC: 4.53 10*6/uL (ref 3.80–5.10)
RDW: 12 % (ref 11.0–15.0)
Total Lymphocyte: 38 %
WBC: 5.1 10*3/uL (ref 3.8–10.8)

## 2019-10-29 LAB — COMPLETE METABOLIC PANEL WITH GFR
AG Ratio: 1.3 (calc) (ref 1.0–2.5)
ALT: 14 U/L (ref 6–29)
AST: 16 U/L (ref 10–35)
Albumin: 4 g/dL (ref 3.6–5.1)
Alkaline phosphatase (APISO): 75 U/L (ref 37–153)
BUN: 14 mg/dL (ref 7–25)
CO2: 28 mmol/L (ref 20–32)
Calcium: 9.2 mg/dL (ref 8.6–10.4)
Chloride: 104 mmol/L (ref 98–110)
Creat: 0.67 mg/dL (ref 0.50–1.05)
GFR, Est African American: 118 mL/min/{1.73_m2} (ref >=60)
GFR, Est Non African American: 102 mL/min/{1.73_m2} (ref >=60)
Globulin: 3.2 g/dL (calc) (ref 1.9–3.7)
Glucose, Bld: 95 mg/dL (ref 65–99)
Potassium: 4.1 mmol/L (ref 3.5–5.3)
Sodium: 139 mmol/L (ref 135–146)
Total Bilirubin: 0.3 mg/dL (ref 0.2–1.2)
Total Protein: 7.2 g/dL (ref 6.1–8.1)

## 2019-10-29 MED ORDER — DICLOFENAC SODIUM 1 % EX GEL: CUTANEOUS | 4 refills | Status: AC

## 2019-10-29 NOTE — Progress Notes (Signed)
Medication Samples have been provided to the patient.  Drug name: otrexup Strength: 25mg       Qty: 1 [id number] Exp.Date: 12/2019  Dosing instructions: Inject 25mg  into the skin once weekly.   The patient has been instructed regarding the correct time, dose, and frequency of taking this medication, including desired effects and most common side effects.   Jerry Clyne C Daphine Loch 4:00 PM 10/29/2019

## 2019-10-30 NOTE — Progress Notes (Signed)
CBC and CMP WNL

## 2019-11-02 ENCOUNTER — Telehealth: Payer: Self-pay | Admitting: Rheumatology

## 2019-11-02 MED ORDER — METHOTREXATE (PF) 25 MG/0.5ML ~~LOC~~ SOAJ: 25.0000 mg | SUBCUTANEOUS | 2 refills | Status: DC

## 2019-11-02 NOTE — Telephone Encounter (Signed)
Plan prefers Rasuvo. Ran test claim, patient has $45.00 copay. Patient has commercial plan and is able to use copay card.

## 2019-11-02 NOTE — Telephone Encounter (Signed)
Patient called stating Dr. Estanislado Pandy wanted her to call the office to let her know how she did with the injection.  Patient states it worked very well.  Patient states her PCP also wanted her to check with Dr. Estanislado Pandy to see if her dry eye and dry mouth could be symptoms of Sjogren's.   Patient also states her PCP is referring her to [facility] to check the nodules in her throat that could be causing patient to sound hoarse and also changed her nasal spray to Declomethasone.

## 2019-11-02 NOTE — Telephone Encounter (Signed)
It is not unusual for patients with rheumatoid arthritis to have secondary Sjogren's.  Although her autoimmune antibodies for Sjogren's were negative.  Dry mouth can be seen in people with rheumatoid arthritis as well.  She is on multiple medications for allergies which can contribute to dry mouth.

## 2019-11-02 NOTE — Telephone Encounter (Signed)
Amber & Rachael- can you see if patient's plan prefers rasuvo or otrexup? Patient was provided with a sample of otrexup at the last visit and Amber taught injection technique. Patient did well.  Dr. Estanislado Pandy- please see message below from patient about dry eyes/mouth.

## 2019-11-02 NOTE — Telephone Encounter (Signed)
Advised patient It is not unusual for patients with rheumatoid arthritis to have secondary Sjogren's.  Although her autoimmune antibodies for Sjogren's were negative.  Dry mouth can be seen in people with rheumatoid arthritis as well.  She is on multiple medications for allergies which can contribute to dry mouth. Patient verbalized understanding.   Also advised patient Plan prefers Rasuvo. Ran test claim, patient has $45.00 copay. Patient has commercial plan and is able to use copay card. patient will activate co-pay card online. Prescription sent to the pharmacy.

## 2019-11-04 ENCOUNTER — Other Ambulatory Visit: Payer: Self-pay | Admitting: Allergy & Immunology

## 2019-11-09 ENCOUNTER — Telehealth: Payer: Self-pay | Admitting: Rheumatology

## 2019-11-09 NOTE — Telephone Encounter (Signed)
Per patient, sample given was 25 mg / 0.71ml Otrexup. Medication received in mail is Rosuvo 25 mg/ 0.65ml. Call patient to let her know if she is taking the correct medication. Name, and ml difference is throwing her off.

## 2019-11-09 NOTE — Telephone Encounter (Signed)
Patient advised that the Otrexup and Rasuvo are both Methotrexate just different brands. Patient advised that her insurance prefers Rasuvo. Patient verified dose and states she will take her injection this evening.

## 2019-11-10 ENCOUNTER — Telehealth: Payer: Self-pay | Admitting: *Deleted

## 2019-11-10 NOTE — Telephone Encounter (Signed)
Patient contacted the office stating she received the Rasuvo in the mail. Patient states she previously was using the Otrexup pen. Patient states she tried to give herself the Rasuvo. Patient states she pressed it against her skin and the needle went into her skin but the medicine come out of the side.

## 2019-11-11 NOTE — Telephone Encounter (Signed)
Scheduled patient for Rasuvo training with [employer] on 11/12/19.

## 2019-11-11 NOTE — Telephone Encounter (Signed)
Patient was given samples of Rasuvo and device training.  She should not inject another pen as uncertain how much medication was received.    She may come in the office for re-education on administration while I am in office tomorrow.  Thanks!  Mariella Saa, PharmD, [place], [place] Clinical Specialty Pharmacist [phone]  11/11/2019 8:22 AM

## 2019-11-12 ENCOUNTER — Ambulatory Visit (INDEPENDENT_AMBULATORY_CARE_PROVIDER_SITE_OTHER): Payer: 59 | Admitting: Pharmacist

## 2019-11-12 ENCOUNTER — Other Ambulatory Visit: Payer: Self-pay

## 2019-11-12 DIAGNOSIS — Z7189 Other specified counseling: Secondary | ICD-10-CM

## 2019-11-12 NOTE — Progress Notes (Signed)
Pharmacy Note  Subjective:  Patient presents today to [facility] to be seen by the pharmacist for counseling on Rasuvo injection technique.  Patient was on vial and syringe methotrexate and wanted to switch to injectable pen.  Patient was counseled on how to use Otrexup pen and given samples.  Her insurance preferred Rasuvo device and a prescription was sent to her pharmacy.  She read the instructions and administered at home but states the medication leaked out of the side.  Objective: Current Outpatient Medications on File Prior to Visit  Medication Sig Dispense Refill  . albuterol (PROAIR HFA) 108 (90 Base) MCG/ACT inhaler TAKE 2 PUFFS EVERY 4 TO 6 HOURS AS NEEDED FOR COUGH/WHEEZE (Patient taking differently: Inhale 2 puffs into the lungs every 4 (four) hours as needed. ) 8.5 Inhaler 2  . B-D TB SYRINGE 1CC/27GX1/2" 27G X 1/2" 1 ML MISC Use to inject Methotrexate weekly. 12 each 3  . beclomethasone (QVAR REDIHALER) 80 MCG/ACT inhaler Inhale 2 puffs into the lungs 2 (two) times daily. 1 Inhaler 5  . busPIRone (BUSPAR) 5 MG tablet Take 5 mg by mouth 3 (three) times daily as needed.    Marland Kitchen dexlansoprazole (DEXILANT) 60 MG capsule TAKE ONE DAILY IN THE MORNING TO PREVENT REFLUX. 30 capsule 0  . diclofenac Sodium (VOLTAREN) 1 % GEL Apply 2g to 4g to affected area up to 4 times daily as needed. 400 g 4  . EPINEPHrine 0.3 mg/0.3 mL IJ SOAJ injection INJ 0.3 ML IM ONCE UTD 1 Device 2  . escitalopram (LEXAPRO) 20 MG tablet Take 20 mg by mouth daily.  5  . famotidine (PEPCID) 40 MG tablet Take 1 tablet (40 mg total) by mouth daily. 30 tablet 0  . folic acid (FOLVITE) 1 MG tablet Take 1 tablet (1 mg total) by mouth 2 (two) times daily. 180 tablet 4  . hydroxychloroquine (PLAQUENIL) 200 MG tablet TAKE 1 TABLET BY MOUTH TWICE A DAY 180 tablet 0  . levocetirizine (XYZAL) 5 MG tablet Take 5 mg by mouth every evening.     . methotrexate 50 MG/2ML injection INJECT 1.0 ML SUBCUTANEOUSLY ONCE WEEKLY.  4 mL 2  . Methotrexate, PF, 25 MG/0.5ML SOAJ Inject 25 mg into the skin once a week. 4 pen 2  . montelukast (SINGULAIR) 10 MG tablet Take 10 mg by mouth daily.    . Multiple Vitamins-Minerals (DAILY MULTIVITAMIN PO) Take 1 tablet by mouth daily.      Current Facility-Administered Medications on File Prior to Visit  Medication Dose Route Frequency Provider Last Rate Last Admin  . Dupilumab SOSY 300 mg  300 mg Subcutaneous Q14 Days Ambs, Kathrine Cords, FNP         Immunization History  Administered Date(s) Administered  . Influenza Inj Mdck Quad Pf 10/19/2017  . Influenza, Seasonal, Injecte, Preservative Fre 09/20/2015     Assessment/Plan:  Educated patient on how to use Rasuvo pen and reviewed injection technique with patient.  Suspect patient was not applying continued pressure during injection to prevent needle from being removed from skin.  Patient was able to demonstrate proper technique for Rasuvo pen.    Pateint states she is having issues with dry eyes and dry mouth.  She has been referred to another provider by her PCP but is unsure where.  She will update [place] about her other health conditions.  All questions encouraged and answered.  Instructed patient to call with any other questions or concerns.  Mariella Saa, PharmD, [place], CPP Clinical  Specialty Pharmacist [phone]  11/12/2019 1:52 PM

## 2019-11-13 ENCOUNTER — Other Ambulatory Visit: Payer: Self-pay

## 2019-11-13 ENCOUNTER — Encounter: Payer: Self-pay | Admitting: Physical Medicine and Rehabilitation

## 2019-11-13 ENCOUNTER — Ambulatory Visit: Payer: 59 | Admitting: Physical Medicine and Rehabilitation

## 2019-11-13 ENCOUNTER — Telehealth: Payer: Self-pay | Admitting: Physical Medicine and Rehabilitation

## 2019-11-13 DIAGNOSIS — M25522 Pain in left elbow: Secondary | ICD-10-CM

## 2019-11-13 DIAGNOSIS — R531 Weakness: Secondary | ICD-10-CM | POA: Diagnosis not present

## 2019-11-13 DIAGNOSIS — R202 Paresthesia of skin: Secondary | ICD-10-CM

## 2019-11-13 NOTE — Telephone Encounter (Signed)
Patient was here. Would like to correct the surgeon's name she gave. It is Dr. Ellouise Newer.

## 2019-11-13 NOTE — Progress Notes (Signed)
 .  Numeric Pain Rating Scale and Functional Assessment Average Pain 8   In the last MONTH (on 0-10 scale) has pain interfered with the following?  1. General activity like being  able to carry out your everyday physical activities such as walking, climbing stairs, carrying groceries, or moving a chair?  Rating(8)   

## 2019-11-16 ENCOUNTER — Encounter: Payer: Self-pay | Admitting: Physical Medicine and Rehabilitation

## 2019-11-16 ENCOUNTER — Other Ambulatory Visit: Payer: Self-pay | Admitting: *Deleted

## 2019-11-16 DIAGNOSIS — M25532 Pain in left wrist: Secondary | ICD-10-CM

## 2019-11-16 NOTE — Telephone Encounter (Signed)
Done

## 2019-11-16 NOTE — Procedures (Signed)
EMG & NCV Findings: Evaluation of the left median motor nerve showed prolonged distal onset latency (6.3 ms), reduced amplitude (4.3 mV), and decreased conduction velocity (Elbow-Wrist, 48 m/s).  The left median (across palm) sensory nerve showed prolonged distal peak latency (Wrist, 4.4 ms), reduced amplitude (3.9 V), and prolonged distal peak latency (Palm, 2.5 ms).  All remaining nerves (as indicated in the following tables) were within normal limits.    Needle evaluation of the left abductor pollicis brevis muscle showed moderately increased spontaneous activity.  All remaining muscles (as indicated in the following table) showed no evidence of electrical instability.    Impression: The above electrodiagnostic study is ABNORMAL and reveals evidence of a severe left median nerve entrapment at the wrist (carpal tunnel syndrome) affecting sensory and motor components. The lesion is characterized by sensorimotor demyelination with evidence of axonal injury.   There is no significant electrodiagnostic evidence of any other focal nerve entrapment, brachial plexopathy or cervical radiculopathy.   Recommendations: 1.  Follow-up with referring physician. 2.  Continue current management of symptoms. 3.  Suggest surgical evaluation.  ___________________________ Laurence Spates FAAPMR Board Certified, American Board of Physical Medicine and Rehabilitation    Nerve Conduction Studies Anti Sensory Summary Table   Stim Site NR Peak (ms) Norm Peak (ms) P-T Amp (V) Norm P-T Amp Site1 Site2 Delta-P (ms) Dist (cm) Vel (m/s) Norm Vel (m/s)  Left Median Acr Palm Anti Sensory (2nd Digit)  32.5C  Wrist    *4.4 <3.6 *3.9 >10 Wrist Palm 1.9 0.0    Palm    *2.5 <2.0 4.8         Left Radial Anti Sensory (Base 1st Digit)  32.6C  Wrist    2.0 <3.1 30.7  Wrist Base 1st Digit 2.0 10.0 50   Left Ulnar Anti Sensory (5th Digit)  32.7C  Wrist    3.1 <3.7 42.8 >15.0 Wrist 5th Digit 3.1 14.0 45 >38   Motor Summary  Table   Stim Site NR Onset (ms) Norm Onset (ms) O-P Amp (mV) Norm O-P Amp Site1 Site2 Delta-0 (ms) Dist (cm) Vel (m/s) Norm Vel (m/s)  Left Median Motor (Abd Poll Brev)  32.5C  Wrist    *6.3 <4.2 *4.3 >5 Elbow Wrist 4.1 19.5 *48 >50  Elbow    10.4  4.3         Left Ulnar Motor (Abd Dig Min)  32.5C  Wrist    2.7 <4.2 5.3 >3 B Elbow Wrist 3.0 17.0 57 >53  B Elbow    5.7  4.9  A Elbow B Elbow 1.3 9.0 69 >53  A Elbow    7.0  4.5          EMG   Side Muscle Nerve Root Ins Act Fibs Psw Amp Dur Poly Recrt Int Fraser Din Comment  Left Abd Poll Brev Median C8-T1 Nml *2+ *2+ Nml Nml 0 Nml Nml   Left 1stDorInt Ulnar C8-T1 Nml Nml Nml Nml Nml 0 Nml Nml   Left PronatorTeres Median C6-7 Nml Nml Nml Nml Nml 0 Nml Nml   Left Biceps Musculocut C5-6 Nml Nml Nml Nml Nml 0 Nml Nml   Left Deltoid Axillary C5-6 Nml Nml Nml Nml Nml 0 Nml Nml     Nerve Conduction Studies Anti Sensory Left/Right Comparison   Stim Site L Lat (ms) R Lat (ms) L-R Lat (ms) L Amp (V) R Amp (V) L-R Amp (%) Site1 Site2 L Vel (m/s) R Vel (m/s) L-R Vel (m/s)  Median Acr  Palm Anti Sensory (2nd Digit)  32.5C  Wrist *4.4   *3.9   Wrist Palm     Palm *2.5   4.8         Radial Anti Sensory (Base 1st Digit)  32.6C  Wrist 2.0   30.7   Wrist Base 1st Digit 50    Ulnar Anti Sensory (5th Digit)  32.7C  Wrist 3.1   42.8   Wrist 5th Digit 45     Motor Left/Right Comparison   Stim Site L Lat (ms) R Lat (ms) L-R Lat (ms) L Amp (mV) R Amp (mV) L-R Amp (%) Site1 Site2 L Vel (m/s) R Vel (m/s) L-R Vel (m/s)  Median Motor (Abd Poll Brev)  32.5C  Wrist *6.3   *4.3   Elbow Wrist *48    Elbow 10.4   4.3         Ulnar Motor (Abd Dig Min)  32.5C  Wrist 2.7   5.3   B Elbow Wrist 57    B Elbow 5.7   4.9   A Elbow B Elbow 69    A Elbow 7.0   4.5            Waveforms:

## 2019-11-16 NOTE — Progress Notes (Signed)
Julie [name] - 54 y.o. female MRN [id number]  Date of birth: 08/01/1966  Office Visit Note: Visit Date: 11/13/2019 PCP: Suella Broad, FNP Referred by: Suella Broad  Subjective: Chief Complaint  Patient presents with  . Left Elbow - Pain  . Left Forearm - Pain  . Left Hand - Numbness   HPI: Julie [name] is a 54 y.o. female who comes in today For evaluation and management of left elbow and left forearm pain with paresthesia as requested by Hazel Sams, PA-C.  Patient follows with Dr. Cy Blamer with complicated history of rheumatoid arthritis as well as fibromyalgia.  She is on high risk medications of Plaquenil and methotrexate.  She is right-hand dominant but endorses over a month of severe worsening symptoms of the left hand with pain numbness and tingling particularly in the thumb index middle and ring finger.  She reports some referral pain in the left forearm and left elbow.  Elbow pain is somewhat lateral than medial.  She denies any right-handed complaints.  She does wear a brace which is helped to some degree.  Medications have not really been helpful.  She reports her symptoms as an 8 out of 10.  She has felt some weakness on the left with doing fine manipulating objects.  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  She has had no frank radicular symptoms.  This does not affect her at night with a positive flick sign.  She has not had prior electrodiagnostic study.  She has no diabetes.  Review of Systems  Constitutional: Negative for chills, fever, malaise/fatigue and weight loss.  HENT: Negative for hearing loss and sinus pain.   Eyes: Negative for blurred vision, double vision and photophobia.  Respiratory: Negative for cough and shortness of breath.   Cardiovascular: Negative for chest pain, palpitations and  leg swelling.  Gastrointestinal: Negative for abdominal pain, nausea and vomiting.  Genitourinary: Negative for flank pain.  Musculoskeletal: Positive for back pain and joint pain. Negative for myalgias.  Skin: Negative for itching and rash.  Neurological: Positive for tingling. Negative for tremors, focal weakness and weakness.  Endo/Heme/Allergies: Negative.   Psychiatric/Behavioral: Negative for depression.  All other systems reviewed and are negative.  Otherwise per HPI.  Assessment & Plan: Visit Diagnoses:  1. Paresthesia of skin   2. Pain in left elbow   3. 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  We did complete electrodiagnostic study today.  The above electrodiagnostic study is ABNORMAL and reveals evidence of a severe left median nerve entrapment at the wrist (carpal tunnel syndrome) affecting sensory and motor components. The lesion is characterized by sensorimotor demyelination with evidence of axonal injury.   There is no significant electrodiagnostic evidence of any other focal nerve entrapment, brachial plexopathy or cervical radiculopathy.   Recommendations: 1.  Follow-up with referring physician. 2.  Continue current management of symptoms. 3.  Suggest surgical evaluation.  Meds & Orders: No orders of the defined types were placed in this encounter.   Orders Placed This Encounter  Procedures  . NCV with EMG (electromyography)    Follow-up: Return for Shaili Deveshwar,MD.   Procedures: No procedures performed  EMG & NCV Findings: Evaluation of the left median motor nerve showed prolonged distal onset latency (6.3 ms), reduced amplitude (4.3 mV), and decreased conduction velocity (Elbow-Wrist, 48 m/s).  The left median (across palm) sensory nerve showed prolonged distal peak latency (Wrist, 4.4 ms), reduced amplitude (3.9 V), and  prolonged distal peak latency (Palm, 2.5 ms).  All remaining nerves (as indicated in the following tables) were within normal limits.    Needle evaluation of the left abductor pollicis brevis muscle showed moderately increased spontaneous activity.  All remaining muscles (as indicated in the following table) showed no evidence of electrical instability.    Impression: The above electrodiagnostic study is ABNORMAL and reveals evidence of a severe left median nerve entrapment at the wrist (carpal tunnel syndrome) affecting sensory and motor components. The lesion is characterized by sensorimotor demyelination with evidence of axonal injury.   There is no significant electrodiagnostic evidence of any other focal nerve entrapment, brachial plexopathy or cervical radiculopathy.   Recommendations: 1.  Follow-up with referring physician. 2.  Continue current management of symptoms. 3.  Suggest surgical evaluation.  ___________________________ Laurence Spates FAAPMR Board Certified, American Board of Physical Medicine and Rehabilitation    Nerve Conduction Studies Anti Sensory Summary Table   Stim Site NR Peak (ms) Norm Peak (ms) P-T Amp (V) Norm P-T Amp Site1 Site2 Delta-P (ms) Dist (cm) Vel (m/s) Norm Vel (m/s)  Left Median Acr Palm Anti Sensory (2nd Digit)  32.5C  Wrist    *4.4 <3.6 *3.9 >10 Wrist Palm 1.9 0.0    Palm    *2.5 <2.0 4.8         Left Radial Anti Sensory (Base 1st Digit)  32.6C  Wrist    2.0 <3.1 30.7  Wrist Base 1st Digit 2.0 10.0 50   Left Ulnar Anti Sensory (5th Digit)  32.7C  Wrist    3.1 <3.7 42.8 >15.0 Wrist 5th Digit 3.1 14.0 45 >38   Motor Summary Table   Stim Site NR Onset (ms) Norm Onset (ms) O-P Amp (mV) Norm O-P Amp Site1 Site2 Delta-0 (ms) Dist (cm) Vel (m/s) Norm Vel (m/s)  Left Median Motor (Abd Poll Brev)  32.5C  Wrist    *6.3 <4.2 *4.3 >5 Elbow Wrist 4.1 19.5 *48 >50  Elbow    10.4  4.3         Left Ulnar Motor (Abd Dig Min)  32.5C  Wrist    2.7 <4.2  5.3 >3 B Elbow Wrist 3.0 17.0 57 >53  B Elbow    5.7  4.9  A Elbow B Elbow 1.3 9.0 69 >53  A Elbow    7.0  4.5          EMG   Side Muscle Nerve Root Ins Act Fibs Psw Amp Dur Poly Recrt Int Fraser Din Comment  Left Abd Poll Brev Median C8-T1 Nml *2+ *2+ Nml Nml 0 Nml Nml   Left 1stDorInt Ulnar C8-T1 Nml Nml Nml Nml Nml 0 Nml Nml   Left PronatorTeres Median C6-7 Nml Nml Nml Nml Nml 0 Nml Nml   Left Biceps Musculocut C5-6 Nml Nml Nml Nml Nml 0 Nml Nml   Left Deltoid Axillary C5-6 Nml Nml Nml Nml Nml 0 Nml Nml     Nerve Conduction Studies Anti Sensory Left/Right Comparison   Stim Site L Lat (ms) R Lat (ms) L-R Lat (ms) L Amp (V) R Amp (V) L-R Amp (%) Site1 Site2 L Vel (m/s) R Vel (m/s) L-R Vel (m/s)  Median  Acr Palm Anti Sensory (2nd Digit)  32.5C  Wrist *4.4   *3.9   Wrist Palm     Palm *2.5   4.8         Radial Anti Sensory (Base 1st Digit)  32.6C  Wrist 2.0   30.7   Wrist Base 1st Digit 50    Ulnar Anti Sensory (5th Digit)  32.7C  Wrist 3.1   42.8   Wrist 5th Digit 45     Motor Left/Right Comparison   Stim Site L Lat (ms) R Lat (ms) L-R Lat (ms) L Amp (mV) R Amp (mV) L-R Amp (%) Site1 Site2 L Vel (m/s) R Vel (m/s) L-R Vel (m/s)  Median Motor (Abd Poll Brev)  32.5C  Wrist *6.3   *4.3   Elbow Wrist *48    Elbow 10.4   4.3         Ulnar Motor (Abd Dig Min)  32.5C  Wrist 2.7   5.3   B Elbow Wrist 57    B Elbow 5.7   4.9   A Elbow B Elbow 69    A Elbow 7.0   4.5            Waveforms:              Clinical History: No specialty comments available.   She reports that she has never smoked. She has never used smokeless tobacco. No results for input(s): HGBA1C, LABURIC in the last 8760 hours.  Objective:  VS:  HT:    WT:   BMI:     BP:   HR: bpm  TEMP: ( )  RESP:  Physical Exam Musculoskeletal:        General: No swelling, tenderness or deformity.     Comments: Inspection reveals mild atrophy of the left APB but no atrophy of the right APB or bilateral  FDI or hand  intrinsics. There is no swelling, color changes, allodynia or dystrophic changes. There is 5 out of 5 strength in the bilateral wrist extension, finger abduction and long finger flexion.  There is decreased sensation in a median nerve distribution on the left.  There is a negative Hoffmann's test bilaterally.  Skin:    General: Skin is warm and dry.     Findings: No erythema or rash.  Neurological:     General: No focal deficit present.     Mental Status: She is alert and oriented to person, place, and time.     Motor: No weakness or abnormal muscle tone.     Coordination: Coordination normal.  Psychiatric:        Mood and Affect: Mood normal.        Behavior: Behavior normal.     Ortho Exam Imaging: No results found.  Past Medical/Family/Surgical/Social History: Medications & Allergies reviewed per EMR, new medications updated. Patient Active Problem List   Diagnosis Date Noted  . Complication of anesthesia   . Other allergic rhinitis 10/10/2018  . Fibromyalgia 02/14/2018  . History of gastroesophageal reflux (GERD) 02/14/2018  . History of asthma 02/14/2018  . Rheumatoid arthritis involving multiple sites with positive rheumatoid factor ([place]) 11/05/2016  . High risk medication use 11/05/2016  . LPRD (laryngopharyngeal reflux disease) 09/20/2015  . Mild persistent asthma 09/20/2015  . Allergic rhinoconjunctivitis 09/20/2015  . Acute sinusitis 07/29/2015  . Asthma with acute exacerbation 07/29/2015  . S/P total hysterectomy 05/25/2015   Past Medical History:  Diagnosis Date  . Anemia   . Asthma   .  Complication of anesthesia   . Eczema   . GERD (gastroesophageal reflux disease)   . PONV (postoperative nausea and vomiting)   . Rheumatoid arthritis ([place])    Family History  Problem Relation Age of Onset  . Asthma Mother   . Eczema Mother   . Sarcoidosis Mother   . Hypertension Mother   . Cancer Father   . Hypotension Father   . Congestive Heart Failure Father   .  Colon cancer Father   . Asthma Sister   . GER disease Sister   . Allergic rhinitis Neg Hx   . Angioedema Neg Hx   . Immunodeficiency Neg Hx   . Urticaria Neg Hx   . Esophageal cancer Neg Hx   . Rectal cancer Neg Hx   . Stomach cancer Neg Hx    Past Surgical History:  Procedure Laterality Date  . ABDOMINAL HYSTERECTOMY     partial age 43  . BILATERAL SALPINGECTOMY Bilateral 05/25/2015   Procedure: BILATERAL SALPINGECTOMY;  Surgeon: Servando Salina, MD;  Location: [facility];  Service: Gynecology;  Laterality: Bilateral;  . COLONOSCOPY    . OVARIAN CYST REMOVAL Right 05/25/2015   Procedure: OVARIAN CYSTECTOMY;  Surgeon: Servando Salina, MD;  Location: [facility];  Service: Gynecology;  Laterality: Right;  . TONSILLECTOMY    . TUBAL LIGATION     Social History   Occupational History  . Not on file  Tobacco Use  . Smoking status: Never Smoker  . Smokeless tobacco: Never Used  Substance and Sexual Activity  . Alcohol use: No  . Drug use: No  . Sexual activity: Not on file

## 2019-11-17 ENCOUNTER — Other Ambulatory Visit: Payer: Self-pay

## 2019-11-17 ENCOUNTER — Ambulatory Visit (INDEPENDENT_AMBULATORY_CARE_PROVIDER_SITE_OTHER): Payer: 59 | Admitting: Allergy & Immunology

## 2019-11-17 ENCOUNTER — Encounter: Payer: Self-pay | Admitting: Allergy & Immunology

## 2019-11-17 VITALS — BP 132/96 | HR 74 | Temp 97.4°F | Resp 18 | Ht 61.0 in | Wt 199.2 lb

## 2019-11-17 DIAGNOSIS — J4540 Moderate persistent asthma, uncomplicated: Secondary | ICD-10-CM | POA: Diagnosis not present

## 2019-11-17 DIAGNOSIS — T7800XD Anaphylactic reaction due to unspecified food, subsequent encounter: Secondary | ICD-10-CM | POA: Diagnosis not present

## 2019-11-17 DIAGNOSIS — K219 Gastro-esophageal reflux disease without esophagitis: Secondary | ICD-10-CM

## 2019-11-17 DIAGNOSIS — R682 Dry mouth, unspecified: Secondary | ICD-10-CM

## 2019-11-17 DIAGNOSIS — J3089 Other allergic rhinitis: Secondary | ICD-10-CM | POA: Diagnosis not present

## 2019-11-17 MED ORDER — EPINEPHRINE 0.3 MG/0.3ML IJ SOAJ: 0.3000 mg | INTRAMUSCULAR | 2 refills | Status: DC | PRN

## 2019-11-17 MED ORDER — CEFDINIR 300 MG PO CAPS: 300.0000 mg | ORAL_CAPSULE | Freq: Two times a day (BID) | ORAL | 0 refills | Status: AC

## 2019-11-17 MED ORDER — QVAR REDIHALER 80 MCG/ACT IN AERB: 2.0000 | INHALATION_SPRAY | Freq: Two times a day (BID) | RESPIRATORY_TRACT | 5 refills | Status: DC

## 2019-11-17 MED ORDER — ALBUTEROL SULFATE HFA 108 (90 BASE) MCG/ACT IN AERS: 2.0000 | INHALATION_SPRAY | RESPIRATORY_TRACT | 1 refills | Status: DC | PRN

## 2019-11-17 NOTE — Progress Notes (Signed)
FOLLOW UP  Date of Service/Encounter:  11/17/19   Assessment:   Chronic rhinosinusitis  Acute sinusitis - s/p prednisone course without full relief   Moderate persistent asthma without complication  Rheumatoid arthritis - on MTX, folic acid, and Plaquenil  Difficulty using the Dupixent autoinjector due to coexisting RA  History of migraines  Xerostomia - obtaining ANA with reflex today  Plan/Recommendations:   1. Moderate persistent asthma without complication - Lung testing actually looks quite good today.  - We will work on getting [place] approved again today. - Tammy will be calling you in the coming week to discuss the approval process.  - We are going to try stopping the daily inhaler to see if this helps the hoarseness.  - Restart the montelukast to help with asthma control.  - Daily controller medication(s): montelukast 10mg  - Prior to physical activity: ProAir 2 puffs 10-15 minutes before physical activity - Rescue medications: ProAir 4 puffs every 4-6 hours as needed or albuterol nebulizer one vial puffs every 4-6 hours as needed  - Asthma control goals:  * Full participation in all desired activities (may need albuterol before activity) * Albuterol use two time or less a week on average (not counting use with activity) * Cough interfering with sleep two time or less a month * Oral steroids no more than once a year * No hospitalizations  2. LPRD (laryngopharyngeal reflux disease) - Continue with Dexilant 60mg  daily.  - Continue with famotidine 20mg  at night.   3. Seasonal allergic rhinitis - Continue with levocetirizine (Xyzal) 5mg  daily. - Continue with Ayr nasal saline gel.  - Hopefully the addition of the [place] will help with the rhinitis symptoms and the asthma symptoms.   4. Food allergies - Continue to avoid peanuts and tree nuts. - We are going to get some labs to check on those levels.   5. Return in about 6 months (around 05/19/2020).     Subjective:   Julie [name] is a 54 y.o. female presenting today for follow up of  Chief Complaint  Patient presents with  . Asthma  . Sinusitis    Julie Larsen Aramburu has a history of the following: Patient Active Problem List   Diagnosis Date Noted  . Complication of anesthesia   . Other allergic rhinitis 10/10/2018  . Fibromyalgia 02/14/2018  . History of gastroesophageal reflux (GERD) 02/14/2018  . History of asthma 02/14/2018  . Rheumatoid arthritis involving multiple sites with positive rheumatoid factor ([place]) 11/05/2016  . High risk medication use 11/05/2016  . LPRD (laryngopharyngeal reflux disease) 09/20/2015  . Mild persistent asthma 09/20/2015  . Allergic rhinoconjunctivitis 09/20/2015  . Acute sinusitis 07/29/2015  . Asthma with acute exacerbation 07/29/2015  . S/P total hysterectomy 05/25/2015    History obtained from: chart review and patient.  Julie is a 54 y.o. female presenting for a follow up visit.  She was last seen by me in January 2020.  At that time, we gave her a Dupixent injection.  She was previously on Xolair, but was not having as much improvement with that.  We continued to Qvar 80 mcg 2 puffs twice daily as well as albuterol 2 puffs every 4-6 hours as needed.  For her reflux, we continue with Dexilant as well as ranitidine.  For her allergies, we continued with Xyzal as well as nasal saline gel.  Since last visit, she has mostly done well. Her father passed away in 2019-04-23, and she is still acclimating to this  new reality. She is very tearful about this today.  She tells me that she is still having problems with sinus issues. She apparently has been on prednisone for over one week at this point. She feels that she is doing really well voice wise. She is having thick mucousy discharge frequently. She feels that this is a "scab on the nose surface". She is scheduled to see   She never did the [place] approved. She tells me that  "they could not get it together". She recalls that it did help. Her last AECs have been 291 - 221 - 151. Therefore we could qualify based on asthma as well.   Asthma/Respiratory Symptom History: She remains on the Qvar two puffs twice daily. She has been on some kind of controller since we started seeing her around 10 years ago or more.  She has never tried stopping her Qvar to see if this helps with the hoarseness.  She did get prednisone most recently for sinusitis, but otherwise has not needed it.  The prednisone does help her hoarseness.  She does think that she did better when she was on the [place].  Allergic Rhinitis Symptom History: She remains on Xyzal as well as nasal saline gel.  She is not using her nasal spray at this point.  She does feel that her sinus infection which was treated with prednisone only has not improved much.  Food Allergy Symptom History: She does have an allergy to peanut butter. She ate a piece of cake over Christmas that had pecans. She did react to this. She has had shrimp since the last visit without any problem.    She apparently is getting worked up for Sjogren's syndrome in addition to to her RA.  This is currently being managed by her primary care provider as well as her rheumatologist.  She has not had any extra blood work done to look for Sjogren's syndrome.  She remains on her methotrexate as well as folic acid and Plaquenil for her RA.  Otherwise, there have been no changes to her past medical history, surgical history, family history, or social history.    Review of Systems  Constitutional: Negative.  Negative for fever, malaise/fatigue and weight loss.  HENT: Negative.  Negative for congestion, ear discharge and ear pain.   Eyes: Negative for pain, discharge and redness.  Respiratory: Positive for cough. Negative for sputum production, shortness of breath and wheezing.        Positive for hoarseness.   Cardiovascular: Negative.  Negative for chest  pain and palpitations.  Gastrointestinal: Negative for abdominal pain, constipation, diarrhea, heartburn, nausea and vomiting.  Skin: Positive for itching and rash.  Neurological: Negative for dizziness and headaches.  Endo/Heme/Allergies: Negative for environmental allergies. Does not bruise/bleed easily.       Objective:   Blood pressure (!) 132/96, pulse 74, temperature (!) 97.4 F (36.3 C), temperature source Temporal, resp. rate 18, height 5\' 1"  (1.549 m), weight 199 lb 3.2 oz (90.4 kg), last menstrual period 05/19/2015, SpO2 98 %. Body mass index is 37.64 kg/m.   Physical Exam:  Physical Exam  Constitutional: She appears well-developed.  Very talkative female.  HENT:  Head: Normocephalic and atraumatic.  Right Ear: Tympanic membrane, external ear and ear canal normal.  Left Ear: Tympanic membrane, external ear and ear canal normal.  Nose: Mucosal edema present. No rhinorrhea, nose lacerations, nasal deformity or septal deviation. No epistaxis. Right sinus exhibits no maxillary sinus tenderness and no frontal sinus tenderness.  Left sinus exhibits no maxillary sinus tenderness and no frontal sinus tenderness.  Mouth/Throat: Uvula is midline and oropharynx is clear and moist. Mucous membranes are not pale and not dry.  No oropharyngeal candidiasis noted.  Uvula absent.  Eyes: Pupils are equal, round, and reactive to light. Conjunctivae and EOM are normal. Right eye exhibits no chemosis and no discharge. Left eye exhibits no chemosis and no discharge. Right conjunctiva is not injected. Left conjunctiva is not injected.  Cardiovascular: Normal rate, regular rhythm and normal heart sounds.  Respiratory: Effort normal and breath sounds normal. No accessory muscle usage. No tachypnea. No respiratory distress. She has no wheezes. She has no rhonchi. She has no rales. She exhibits no tenderness.  Moving air well in all lung fields.  Lymphadenopathy:    She has no cervical adenopathy.   Neurological: She is alert.  Skin: No abrasion, no petechiae and no rash noted. Rash is not papular, not vesicular and not urticarial. No erythema. No pallor.  No eczematous or urticarial lesions noted.  Psychiatric: She has a normal mood and affect.     Diagnostic studies:    Spirometry: results normal (FEV1: 1.67/82%, FVC: 1.99/78%, FEV1/FVC: 84%).    Spirometry consistent with normal pattern.   Allergy Studies: none         Salvatore Marvel, MD  [facility]

## 2019-11-17 NOTE — Patient Instructions (Addendum)
1. Moderate persistent asthma without complication - Lung testing actually looks quite good today.  - We will work on getting [place] approved again today. - Tammy will be calling you in the coming week to discuss the approval process.  - We are going to try stopping the daily inhaler to see if this helps the hoarseness.  - Restart the montelukast to help with asthma control.  - Daily controller medication(s): montelukast 10mg  - Prior to physical activity: ProAir 2 puffs 10-15 minutes before physical activity - Rescue medications: ProAir 4 puffs every 4-6 hours as needed or albuterol nebulizer one vial puffs every 4-6 hours as needed  - Asthma control goals:  * Full participation in all desired activities (may need albuterol before activity) * Albuterol use two time or less a week on average (not counting use with activity) * Cough interfering with sleep two time or less a month * Oral steroids no more than once a year * No hospitalizations  2. LPRD (laryngopharyngeal reflux disease) - Continue with Dexilant 60mg  daily.  - Continue with famotidine 20mg  at night.   3. Seasonal allergic rhinitis - Continue with levocetirizine (Xyzal) 5mg  daily. - Continue with Ayr nasal saline gel.  - Hopefully the addition of the [place] will help with the rhinitis symptoms and the asthma symptoms.   4. Food allergies - Continue to avoid peanuts and tree nuts. - We are going to get some labs to check on those levels.   5. Return in about 6 months (around 05/19/2020).   Please inform us of any Emergency Department visits, hospitalizations, or changes in symptoms. Call us before going to the ED for breathing or allergy symptoms since we might be able to fit you in for a sick visit. Feel free to contact us anytime with any questions, problems, or concerns.  It was a pleasure to see you again today!   Websites that have reliable patient information: 1. American Academy of Asthma, Allergy, and  Immunology: www.aaaai.org 2. Food Allergy Research and Education (FARE): foodallergy.org 3. Mothers of Asthmatics: http://www.asthmacommunitynetwork.org 4. American College of Allergy, Asthma, and Immunology: www.acaai.org

## 2019-11-18 DIAGNOSIS — J4540 Moderate persistent asthma, uncomplicated: Secondary | ICD-10-CM | POA: Insufficient documentation

## 2019-11-18 LAB — ANA W/REFLEX IF POSITIVE
Anti JO-1: <0.2 AI (ref 0.0–0.9)
Anti Nuclear Antibody (ANA): POSITIVE — AB
Centromere Ab Screen: <0.2 AI (ref 0.0–0.9)
Chromatin Ab SerPl-aCnc: 0.2 AI (ref 0.0–0.9)
ENA RNP Ab: 2.8 AI — ABNORMAL HIGH (ref 0.0–0.9)
ENA SM Ab Ser-aCnc: 0.8 AI (ref 0.0–0.9)
ENA SSA (RO) Ab: <0.2 AI (ref 0.0–0.9)
ENA SSB (LA) Ab: <0.2 AI (ref 0.0–0.9)
Scleroderma (Scl-70) (ENA) Antibody, IgG: <0.2 AI (ref 0.0–0.9)
dsDNA Ab: <1 IU/mL (ref 0–9)

## 2019-11-20 NOTE — Progress Notes (Signed)
Please add ANA titer if possible.

## 2019-11-21 LAB — IGE+ALLERGENS ZONE 2(30)
Alternaria Alternata IgE: 4.06 kU/L — AB
Amer Sycamore IgE Qn: 0.36 kU/L — AB
Aspergillus Fumigatus IgE: 0.74 kU/L — AB
Bahia Grass IgE: 0.63 kU/L — AB
Bermuda Grass IgE: 0.23 kU/L — AB
Cat Dander IgE: 3.01 kU/L — AB
Cedar, Mountain IgE: 0.32 kU/L — AB
Cladosporium Herbarum IgE: 0.44 kU/L — AB
Cockroach, American IgE: <0.1 kU/L
Common Silver Birch IgE: 0.47 kU/L — AB
D Farinae IgE: <0.1 kU/L
D Pteronyssinus IgE: 0.11 kU/L — AB
Dog Dander IgE: 12.7 kU/L — AB
Elm, American IgE: 0.63 kU/L — AB
Hickory, White IgE: 0.36 kU/L — AB
IgE (Immunoglobulin E), Serum: 81 IU/mL (ref 6–495)
Johnson Grass IgE: 0.3 kU/L — AB
Maple/Box Elder IgE: 0.37 kU/L — AB
Mucor Racemosus IgE: <0.1 kU/L
Mugwort IgE Qn: 0.17 kU/L — AB
Nettle IgE: <0.1 kU/L
Oak, White IgE: 0.58 kU/L — AB
Penicillium Chrysogen IgE: 0.13 kU/L — AB
Pigweed, Rough IgE: 0.23 kU/L — AB
Plantain, English IgE: 0.42 kU/L — AB
Ragweed, Short IgE: 0.29 kU/L — AB
Sheep Sorrel IgE Qn: 0.25 kU/L — AB
Stemphylium Herbarum IgE: 2.18 kU/L — AB
Sweet gum IgE RAST Ql: <0.1 kU/L
Timothy Grass IgE: 0.68 kU/L — AB
White Mulberry IgE: <0.1 kU/L

## 2019-11-21 LAB — IGE PEANUT COMPONENT PROFILE
F352-IgE Ara h 8: <0.1 kU/L
F422-IgE Ara h 1: <0.1 kU/L
F423-IgE Ara h 2: 0.28 kU/L — AB
F424-IgE Ara h 3: <0.1 kU/L
F427-IgE Ara h 9: <0.1 kU/L
F447-IgE Ara h 6: 0.26 kU/L — AB

## 2019-11-21 LAB — ALLERGY PANEL 18, NUT MIX GROUP
Allergen Coconut IgE: <0.1 kU/L
F020-IgE Almond: <0.1 kU/L
F202-IgE Cashew Nut: <0.1 kU/L
Hazelnut (Filbert) IgE: 0.1 kU/L — AB
Peanut IgE: 0.53 kU/L — AB
Pecan Nut IgE: <0.1 kU/L
Sesame Seed IgE: 1.4 kU/L — AB

## 2019-11-22 ENCOUNTER — Encounter: Payer: Self-pay | Admitting: Allergy & Immunology

## 2019-11-23 ENCOUNTER — Telehealth: Payer: Self-pay | Admitting: Pharmacy Technician

## 2019-11-23 NOTE — Telephone Encounter (Signed)
Received a fax regarding Prior Authorization from [facility] for [place]. Authorization has been DENIED because The requested medication and/or diagnosis are not a covered benefit and excluded from coverage inaccordance with the terms and conditions of your plan benefit. Therefore, the request has been administratively denied.  Phone# [phone]  3:18 PM Beatriz Chancellor, CPhT

## 2019-11-23 NOTE — Telephone Encounter (Signed)
Submitted a Prior Authorization request to [facility] for [place] via Cover My Meds. Will update once we receive a response.  PA# [id number]  2:41 PM Beatriz Chancellor, CPhT

## 2019-11-24 NOTE — Addendum Note (Signed)
Addended by: Valentina Shaggy on: 11/24/2019 09:40 AM   Modules accepted: Orders

## 2019-11-25 ENCOUNTER — Encounter: Payer: Self-pay | Admitting: Allergy & Immunology

## 2019-11-25 ENCOUNTER — Encounter: Payer: Self-pay | Admitting: Pharmacist

## 2019-11-25 ENCOUNTER — Other Ambulatory Visit: Payer: Self-pay | Admitting: *Deleted

## 2019-11-25 LAB — SPECIMEN STATUS REPORT

## 2019-11-25 LAB — ANA: ANA Titer 1: NEGATIVE

## 2019-11-25 MED ORDER — DUPIXENT 300 MG/2ML ~~LOC~~ SOSY: 600.0000 mg | PREFILLED_SYRINGE | Freq: Once | SUBCUTANEOUS | 11 refills | Status: AC

## 2019-11-25 NOTE — Telephone Encounter (Signed)
Called and advised patient that have approval for her [place] will send Rx for same to [facility] and they should have copay card still on file

## 2019-11-25 NOTE — Telephone Encounter (Signed)
Appeal faxed to [place] [employer].  We will update patient when we receive a response.  Fax [phone] Reference [id number]   Mariella Saa, PharmD, [place] Clinical Specialty Pharmacist [phone]  11/25/2019 9:34 AM

## 2019-11-26 ENCOUNTER — Other Ambulatory Visit: Payer: Self-pay | Admitting: Orthopedic Surgery

## 2019-11-27 NOTE — Progress Notes (Signed)
Yes, when ANA is positive and the titer is negative. It means the ANA value is not significant. Although her RNP is positive. This should be repeated sometimes in the future to confirm.

## 2019-12-04 ENCOUNTER — Other Ambulatory Visit: Payer: Self-pay | Admitting: Rheumatology

## 2019-12-04 DIAGNOSIS — M0579 Rheumatoid arthritis with rheumatoid factor of multiple sites without organ or systems involvement: Secondary | ICD-10-CM

## 2019-12-04 NOTE — Telephone Encounter (Signed)
Last Visit: 11/12/19 Next Visit: 03/31/20 Labs: 10/29/19 CBC and CMP WNL PLQ Eye Exam: 05/05/2019 WNL   Okay to refill per Dr. Estanislado Pandy

## 2019-12-06 ENCOUNTER — Other Ambulatory Visit: Payer: Self-pay | Admitting: Allergy & Immunology

## 2019-12-09 ENCOUNTER — Ambulatory Visit: Payer: 59 | Admitting: Physician Assistant

## 2019-12-15 NOTE — Telephone Encounter (Signed)
Notify patient of appeal denial.  Advised that she will have to resume Rasuvo or vial and syringe.  Patient states she still has 1 more pen of Rasuvo and will try to administer for her next dose on Monday.  Advised patient if there is any issue injecting do not inject for a second time.  Patient verbalized understanding.  If she is unable to administer Rasuvo correctly she will have to continue vial and syringe.  Patient will call Monday to let us know if she is going to proceed with Rasuvo or vial and syringe.   Mariella Saa, PharmD, [place], [place] Clinical Specialty Pharmacist [phone]  12/15/2019 1:45 PM

## 2019-12-15 NOTE — Telephone Encounter (Signed)
Called [employer] to check status of [place] appeal, rep Shanon Brow advised that as of 12/07/19 the appeal was upheld. Letter will be sent to member and provider's office.  Ref# [id number] Phone# [phone]  10:12 AM Beatriz Chancellor, CPhT

## 2019-12-22 NOTE — Telephone Encounter (Signed)
Called patient to follow up on methotrexate injection.  She states she did not take yesterday's dose as she "got home late".  Advised her to call if she has any issues injecting Rasuvo and if we need to send a prescription for vial and syringe. Patient verbalized understanding.   Mariella Saa, PharmD, [place], [place] Clinical Specialty Pharmacist [phone]  12/22/2019 8:48 AM

## 2019-12-29 ENCOUNTER — Other Ambulatory Visit: Payer: Self-pay

## 2019-12-29 ENCOUNTER — Encounter (HOSPITAL_BASED_OUTPATIENT_CLINIC_OR_DEPARTMENT_OTHER): Payer: Self-pay | Admitting: Orthopedic Surgery

## 2019-12-31 ENCOUNTER — Other Ambulatory Visit: Payer: Self-pay

## 2019-12-31 ENCOUNTER — Ambulatory Visit: Payer: Self-pay

## 2019-12-31 ENCOUNTER — Ambulatory Visit: Payer: 59

## 2019-12-31 ENCOUNTER — Ambulatory Visit (INDEPENDENT_AMBULATORY_CARE_PROVIDER_SITE_OTHER): Payer: 59

## 2019-12-31 DIAGNOSIS — J4540 Moderate persistent asthma, uncomplicated: Secondary | ICD-10-CM | POA: Diagnosis not present

## 2019-12-31 NOTE — Progress Notes (Signed)
Immunotherapy   Patient Details  Name: Julie [name] MRN: [id number] Date of Birth: 18-May-1966  12/31/2019  Julie [name] is starting Dupixent injections today. She will receive a loading dose of 600 mg (2 separate injections). She will then receive 300 mg (one injection) every 2 weeks. She did wait in the office for 30 minutes following the injections. She did not get checked prior to leaving the office.  EpiPen available.  Consent signed and patient instructions given.   Rosalio Loud 12/31/2019, 5:26 PM

## 2020-01-01 ENCOUNTER — Encounter (HOSPITAL_COMMUNITY)
Admission: RE | Admit: 2020-01-01 | Discharge: 2020-01-01 | Disposition: A | Discharge status: Home / Self Care | Payer: 59 | Source: Ambulatory Visit | Attending: Orthopedic Surgery | Admitting: Orthopedic Surgery

## 2020-01-01 DIAGNOSIS — Z20822 Contact with and (suspected) exposure to covid-19: Secondary | ICD-10-CM | POA: Diagnosis not present

## 2020-01-01 DIAGNOSIS — Z01812 Encounter for preprocedural laboratory examination: Secondary | ICD-10-CM | POA: Diagnosis present

## 2020-01-01 LAB — SARS CORONAVIRUS 2 (TAT 6-24 HRS): SARS Coronavirus 2: NEGATIVE

## 2020-01-01 NOTE — Progress Notes (Addendum)

## 2020-01-05 ENCOUNTER — Ambulatory Visit (HOSPITAL_BASED_OUTPATIENT_CLINIC_OR_DEPARTMENT_OTHER): Payer: 59 | Admitting: Anesthesiology

## 2020-01-05 ENCOUNTER — Other Ambulatory Visit: Payer: Self-pay

## 2020-01-05 ENCOUNTER — Ambulatory Visit (HOSPITAL_BASED_OUTPATIENT_CLINIC_OR_DEPARTMENT_OTHER)
Admission: RE | Admit: 2020-01-05 | Discharge: 2020-01-05 | Disposition: A | Discharge status: Home / Self Care | Payer: 59 | Attending: Orthopedic Surgery | Admitting: Orthopedic Surgery

## 2020-01-05 ENCOUNTER — Encounter (HOSPITAL_BASED_OUTPATIENT_CLINIC_OR_DEPARTMENT_OTHER): Payer: Self-pay | Admitting: Orthopedic Surgery

## 2020-01-05 ENCOUNTER — Encounter (HOSPITAL_BASED_OUTPATIENT_CLINIC_OR_DEPARTMENT_OTHER)
Admission: RE | Disposition: A | Discharge status: Home / Self Care | Payer: Self-pay | Source: Home / Self Care | Attending: Orthopedic Surgery

## 2020-01-05 DIAGNOSIS — G5602 Carpal tunnel syndrome, left upper limb: Secondary | ICD-10-CM | POA: Diagnosis present

## 2020-01-05 DIAGNOSIS — M542 Cervicalgia: Secondary | ICD-10-CM | POA: Insufficient documentation

## 2020-01-05 HISTORY — PX: CARPAL TUNNEL RELEASE: SHX101

## 2020-01-05 SURGERY — CARPAL TUNNEL RELEASE
Anesthesia: Monitor Anesthesia Care | Site: Hand | Laterality: Left

## 2020-01-05 MED ORDER — FENTANYL CITRATE (PF) 250 MCG/5ML IJ SOLN
INTRAMUSCULAR | Status: DC | PRN
  Administered 2020-01-05: 50 ug via INTRAVENOUS
  Administered 2020-01-05: 25 ug via INTRAVENOUS

## 2020-01-05 MED ORDER — SCOPOLAMINE 1 MG/3DAYS TD PT72
1.0000 | MEDICATED_PATCH | TRANSDERMAL | Status: DC
  Administered 2020-01-05: 1.5 mg via TRANSDERMAL

## 2020-01-05 MED ORDER — LACTATED RINGERS IV SOLN: INTRAVENOUS | Status: DC

## 2020-01-05 MED ORDER — FENTANYL CITRATE (PF) 100 MCG/2ML IJ SOLN: 50.0000 ug | INTRAMUSCULAR | Status: DC | PRN

## 2020-01-05 MED ORDER — MIDAZOLAM HCL 5 MG/5ML IJ SOLN
INTRAMUSCULAR | Status: DC | PRN
  Administered 2020-01-05: 2 mg via INTRAVENOUS

## 2020-01-05 MED ORDER — VANCOMYCIN HCL IN DEXTROSE 1-5 GM/200ML-% IV SOLN
1000.0000 mg | INTRAVENOUS | Status: AC
  Administered 2020-01-05: 1000 mg via INTRAVENOUS

## 2020-01-05 MED ORDER — TRAMADOL HCL 50 MG PO TABS: 50.0000 mg | ORAL_TABLET | Freq: Four times a day (QID) | ORAL | 0 refills | Status: DC | PRN

## 2020-01-05 MED ORDER — MIDAZOLAM HCL 2 MG/2ML IJ SOLN
INTRAMUSCULAR | Status: AC
  Filled 2020-01-05: qty 2

## 2020-01-05 MED ORDER — MEPERIDINE HCL 25 MG/ML IJ SOLN: 6.2500 mg | INTRAMUSCULAR | Status: DC | PRN

## 2020-01-05 MED ORDER — OXYCODONE HCL 5 MG/5ML PO SOLN: 5.0000 mg | Freq: Once | ORAL | Status: DC | PRN

## 2020-01-05 MED ORDER — CHLORHEXIDINE GLUCONATE 4 % EX LIQD: 60.0000 mL | Freq: Once | CUTANEOUS | Status: DC

## 2020-01-05 MED ORDER — MIDAZOLAM HCL 2 MG/2ML IJ SOLN: 1.0000 mg | INTRAMUSCULAR | Status: DC | PRN

## 2020-01-05 MED ORDER — ACETAMINOPHEN 500 MG PO TABS
ORAL_TABLET | ORAL | Status: AC
  Filled 2020-01-05: qty 2

## 2020-01-05 MED ORDER — FENTANYL CITRATE (PF) 100 MCG/2ML IJ SOLN
INTRAMUSCULAR | Status: AC
  Filled 2020-01-05: qty 2

## 2020-01-05 MED ORDER — PROPOFOL 10 MG/ML IV BOLUS
INTRAVENOUS | Status: AC
  Filled 2020-01-05: qty 20

## 2020-01-05 MED ORDER — SCOPOLAMINE 1 MG/3DAYS TD PT72
MEDICATED_PATCH | TRANSDERMAL | Status: AC
  Filled 2020-01-05: qty 1

## 2020-01-05 MED ORDER — LIDOCAINE 2% (20 MG/ML) 5 ML SYRINGE
INTRAMUSCULAR | Status: AC
  Filled 2020-01-05: qty 5

## 2020-01-05 MED ORDER — PROPOFOL 500 MG/50ML IV EMUL
INTRAVENOUS | Status: AC
  Filled 2020-01-05: qty 50

## 2020-01-05 MED ORDER — VANCOMYCIN HCL IN DEXTROSE 1-5 GM/200ML-% IV SOLN
INTRAVENOUS | Status: AC
  Filled 2020-01-05: qty 200

## 2020-01-05 MED ORDER — OXYCODONE HCL 5 MG PO TABS: 5.0000 mg | ORAL_TABLET | Freq: Once | ORAL | Status: DC | PRN

## 2020-01-05 MED ORDER — HYDROMORPHONE HCL 1 MG/ML IJ SOLN: 0.2500 mg | INTRAMUSCULAR | Status: DC | PRN

## 2020-01-05 MED ORDER — BUPIVACAINE HCL (PF) 0.25 % IJ SOLN
INTRAMUSCULAR | Status: AC
  Filled 2020-01-05: qty 30

## 2020-01-05 MED ORDER — PROMETHAZINE HCL 25 MG/ML IJ SOLN: 6.2500 mg | INTRAMUSCULAR | Status: DC | PRN

## 2020-01-05 MED ORDER — BUPIVACAINE HCL (PF) 0.25 % IJ SOLN
INTRAMUSCULAR | Status: DC | PRN
  Administered 2020-01-05: 9 mL

## 2020-01-05 MED ORDER — ACETAMINOPHEN 500 MG PO TABS
1000.0000 mg | ORAL_TABLET | Freq: Once | ORAL | Status: AC
  Administered 2020-01-05: 1000 mg via ORAL

## 2020-01-05 MED ORDER — KETOROLAC TROMETHAMINE 30 MG/ML IJ SOLN: 30.0000 mg | Freq: Once | INTRAMUSCULAR | Status: DC | PRN

## 2020-01-05 MED ORDER — PROPOFOL 500 MG/50ML IV EMUL
INTRAVENOUS | Status: DC | PRN
  Administered 2020-01-05: 120 ug/kg/min via INTRAVENOUS

## 2020-01-05 MED ORDER — LIDOCAINE HCL (PF) 0.5 % IJ SOLN
INTRAMUSCULAR | Status: DC | PRN
  Administered 2020-01-05: 35 mL via INTRAVENOUS

## 2020-01-05 SURGICAL SUPPLY — 40 items
APL PRP STRL LF DISP 70% ISPRP (MISCELLANEOUS) ×1
BLADE SURG 15 STRL LF DISP TIS (BLADE) ×1 IMPLANT
BLADE SURG 15 STRL SS (BLADE) ×2
BNDG CMPR 9X4 STRL LF SNTH (GAUZE/BANDAGES/DRESSINGS) ×1
BNDG COHESIVE 3X5 TAN STRL LF (GAUZE/BANDAGES/DRESSINGS) ×2 IMPLANT
BNDG ESMARK 4X9 LF (GAUZE/BANDAGES/DRESSINGS) ×1 IMPLANT
BNDG GAUZE ELAST 4 BULKY (GAUZE/BANDAGES/DRESSINGS) ×2 IMPLANT
CHLORAPREP W/TINT 26 (MISCELLANEOUS) ×2 IMPLANT
CORD BIPOLAR FORCEPS 12FT (ELECTRODE) ×2 IMPLANT
COVER BACK TABLE 60X90IN (DRAPES) ×2 IMPLANT
COVER MAYO STAND STRL (DRAPES) ×2 IMPLANT
COVER WAND RF STERILE (DRAPES) IMPLANT
CUFF TOURN SGL QUICK 18X4 (TOURNIQUET CUFF) ×2 IMPLANT
DRAPE EXTREMITY T 121X128X90 (DISPOSABLE) ×2 IMPLANT
DRAPE SURG 17X23 STRL (DRAPES) ×2 IMPLANT
DRSG PAD ABDOMINAL 8X10 ST (GAUZE/BANDAGES/DRESSINGS) ×2 IMPLANT
GAUZE SPONGE 4X4 12PLY STRL (GAUZE/BANDAGES/DRESSINGS) ×2 IMPLANT
GAUZE XEROFORM 1X8 LF (GAUZE/BANDAGES/DRESSINGS) ×2 IMPLANT
GLOVE BIO SURGEON STRL SZ 6.5 (GLOVE) ×1 IMPLANT
GLOVE BIOGEL PI IND STRL 6.5 (GLOVE) IMPLANT
GLOVE BIOGEL PI IND STRL 7.0 (GLOVE) IMPLANT
GLOVE BIOGEL PI IND STRL 8.5 (GLOVE) ×1 IMPLANT
GLOVE BIOGEL PI INDICATOR 6.5 (GLOVE) ×1
GLOVE BIOGEL PI INDICATOR 7.0 (GLOVE) ×1
GLOVE BIOGEL PI INDICATOR 8.5 (GLOVE) ×1
GLOVE SURG ORTHO 8.0 STRL STRW (GLOVE) ×2 IMPLANT
GOWN STRL REUS W/ TWL LRG LVL3 (GOWN DISPOSABLE) ×1 IMPLANT
GOWN STRL REUS W/TWL LRG LVL3 (GOWN DISPOSABLE) ×2
GOWN STRL REUS W/TWL XL LVL3 (GOWN DISPOSABLE) ×2 IMPLANT
NDL PRECISIONGLIDE 27X1.5 (NEEDLE) IMPLANT
NEEDLE PRECISIONGLIDE 27X1.5 (NEEDLE) ×2 IMPLANT
NS IRRIG 1000ML POUR BTL (IV SOLUTION) ×2 IMPLANT
PACK BASIN DAY SURGERY FS (CUSTOM PROCEDURE TRAY) ×2 IMPLANT
STOCKINETTE 4X48 STRL (DRAPES) ×2 IMPLANT
SUT ETHILON 4 0 PS 2 18 (SUTURE) ×2 IMPLANT
SUT VICRYL 4-0 PS2 18IN ABS (SUTURE) IMPLANT
SYR BULB EAR ULCER 3OZ GRN STR (SYRINGE) ×2 IMPLANT
SYR CONTROL 10ML LL (SYRINGE) ×1 IMPLANT
TOWEL GREEN STERILE FF (TOWEL DISPOSABLE) ×2 IMPLANT
UNDERPAD 30X36 HEAVY ABSORB (UNDERPADS AND DIAPERS) ×2 IMPLANT

## 2020-01-05 NOTE — Anesthesia Preprocedure Evaluation (Signed)
Anesthesia Evaluation  Patient identified by MRN, date of birth, ID band Patient awake    Reviewed: Allergy & Precautions, H&P , NPO status , Patient's Chart, lab work & pertinent test results, reviewed documented beta blocker date and time   History of Anesthesia Complications (+) PONV and history of anesthetic complications  Airway Mallampati: II  TM Distance: >3 FB Neck ROM: full    Dental no notable dental hx. (+) Teeth Intact, Dental Advisory Given   Pulmonary asthma ,    Pulmonary exam normal breath sounds clear to auscultation       Cardiovascular negative cardio ROS   Rhythm:regular Rate:Normal     Neuro/Psych negative neurological ROS  negative psych ROS   GI/Hepatic GERD  Medicated and Controlled,  Endo/Other  Obesity BMI 37  Renal/GU   negative genitourinary   Musculoskeletal  (+) Arthritis , Rheumatoid disorders,  Fibromyalgia -Left carpal tunnel syndrome  RA- MTX, hydroxychloroquine    Abdominal (+) + obese,   Peds  Hematology  (+) Blood dyscrasia, anemia ,   Anesthesia Other Findings Rheumatoid arthritis          Reproductive/Obstetrics negative OB ROS                             Anesthesia Physical Anesthesia Plan  ASA: II  Anesthesia Plan: MAC and Bier Block and Bier Block-LIDOCAINE ONLY   Post-op Pain Management:    Induction:   PONV Risk Score and Plan: 2 and Propofol infusion and TIVA  Airway Management Planned: Natural Airway and Simple Face Mask  Additional Equipment: None  Intra-op Plan:   Post-operative Plan:   Informed Consent: I have reviewed the patients History and Physical, chart, labs and discussed the procedure including the risks, benefits and alternatives for the proposed anesthesia with the patient or authorized representative who has indicated his/her understanding and acceptance.       Plan Discussed with: CRNA  Anesthesia  Plan Comments:         Anesthesia Quick Evaluation

## 2020-01-05 NOTE — Op Note (Signed)
NAME: Julie [name] MEDICAL RECORD NO: [id number] DATE OF BIRTH: Mar 23, 1966 FACILITY: [facility] LOCATION: [facility] PHYSICIAN: Wynonia Sours, MD   OPERATIVE REPORT   DATE OF PROCEDURE: 01/05/20    PREOPERATIVE DIAGNOSIS:   Carpal tunnel syndrome left hand   POSTOPERATIVE DIAGNOSIS:   Same   PROCEDURE:   Decompression median nerve left hand   SURGEON: Daryll Brod, M.D.   ASSISTANT: none   ANESTHESIA:  Bier block with sedation and Local   INTRAVENOUS FLUIDS:  Per anesthesia flow sheet.   ESTIMATED BLOOD LOSS:  Minimal.   COMPLICATIONS:  None.   SPECIMENS:  none   TOURNIQUET TIME:    Total Tourniquet Time Documented: Forearm (Left) - 25 minutes Total: Forearm (Left) - 25 minutes    DISPOSITION:  Stable to PACU.   INDICATIONS: Patient is a 54 year old female with a history of carpal tunnel syndrome.  Nerve conductions are positive.  She has declined conservative treatment and has opted to proceed with surgical release.  Preperi-and postoperative course been discussed along with risks and complications.  She is aware that there is no guarantee to the surgery the possibility of infection recurrence injury to arteries nerves tendons complete relief symptoms and dystrophy.  In the preoperative area the patient is seen the extremity marked by both patient and surgeon antibiotic given  OPERATIVE COURSE: Patient is brought to the operating room placed in the supine position a forearm IV regional anesthetic was carried out without difficulty under the direction the anesthesia department.  She was prepped using ChloraPrep and a 3-minute dry time allowed.  A timeout was taken to confirm patient procedure.  A longitudinal incision was made left palm carried down through subcutaneous tissue.  Bleeders were electrocauterized with bipolar.  The palmar fascia was split.  The superficial palmar arch was identified along with the flexor tendon to the ring little finger.   Retractors were placed retracting median nerve flexor tendons radial ulnar nerve ulnarly.  A right angle and stool retractor was placed between skin and forearm fascia.  Deep structures were dissected free.  This was done with blunt dissection the proximal aspect of the flexor retinaculum distal forearm fascia was then released for approximately 2 to 3 cm proximal to the wrist crease under direct vision.  The canal was explored.  No further lesions were identified.  The wound was copiously irrigated with saline.  The branch entered in the muscle distally.  Area compression and nerve of the nerve was immediately apparent with significant hyperemia and hourglass deformity.  The wound was then closed interrupted 4 nylon sutures.  Local infiltration quarter percent bupivacaine without epinephrine was given approximately 9 cc was used.  Sterile compressive dressing with the fingers 3 was applied.  On deflation of the tourniquet all fingers immediately pink.  She was taken to the recovery room for observation in satisfactory condition.  She will be discharged home to return to [place] in 1week [place].   Daryll Brod, MD Electronically signed, 01/05/20
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
## 2020-01-05 NOTE — Discharge Instructions (Addendum)
[facility] Instructions Hand Surgery  Wound Care: Keep your hand elevated above the level of your heart.  Do not allow it to dangle by your side.  Keep the dressing dry and do not remove it unless your doctor advises you to do so.  He will usually change it at the time of your post-op visit.  Moving your fingers is advised to stimulate circulation but will depend on the site of your surgery.  If you have a splint applied, your doctor will advise you regarding movement.  Activity: Do not drive or operate machinery today.  Rest today and then you may return to your normal activity and work as indicated by your physician.  Diet:  Drink liquids today or eat a light diet.  You may resume a regular diet tomorrow.    General expectations: Pain for two to three days. Fingers may become slightly swollen.  Call your doctor if any of the following occur: Severe pain not relieved by pain medication. Elevated temperature. Dressing soaked with blood. Inability to move fingers. White or bluish color to fingers.  No tylenol today until after 4pm.    Post Anesthesia Home Care Instructions  Activity: Get plenty of rest for the remainder of the day. A responsible individual must stay with you for 24 hours following the procedure.  For the next 24 hours, DO NOT: -Drive a car -Paediatric nurse -Drink alcoholic beverages -Take any medication unless instructed by your physician -Make any legal decisions or sign important papers.  Meals: Start with liquid foods such as gelatin or soup. Progress to regular foods as tolerated. Avoid greasy, spicy, heavy foods. If nausea and/or vomiting occur, drink only clear liquids until the nausea and/or vomiting subsides. Call your physician if vomiting continues.  Special Instructions/Symptoms: Your throat may feel dry or sore from the anesthesia or the breathing tube placed in your throat during surgery. If this causes discomfort, gargle with warm salt water.  The discomfort should disappear within 24 hours.  If you had a scopolamine patch placed behind your ear for the management of post- operative nausea and/or vomiting:  1. The medication in the patch is effective for 72 hours, after which it should be removed.  Wrap patch in a tissue and discard in the trash. Wash hands thoroughly with soap and water. 2. You may remove the patch earlier than 72 hours if you experience unpleasant side effects which may include dry mouth, dizziness or visual disturbances. 3. Avoid touching the patch. Wash your hands with soap and water after contact with the patch.  Regional Anesthesia Blocks  1. Numbness or the inability to move the "blocked" extremity may last from 3-48 hours after placement. The length of time depends on the medication injected and your individual response to the medication. If the numbness is not going away after 48 hours, call your surgeon.  2. The extremity that is blocked will need to be protected until the numbness is gone and the  Strength has returned. Because you cannot feel it, you will need to take extra care to avoid injury. Because it may be weak, you may have difficulty moving it or using it. You may not know what position it is in without looking at it while the block is in effect.  3. For blocks in the legs and feet, returning to weight bearing and walking needs to be done carefully. You will need to wait until the numbness is entirely gone and the strength has returned. You should  be able to move your leg and foot normally before you try and bear weight or walk. You will need someone to be with you when you first try to ensure you do not fall and possibly risk injury.  4. Bruising and tenderness at the needle site are common side effects and will resolve in a few days.  5. Persistent numbness or new problems with movement should be communicated to the surgeon or the [facility] [phone] [place]  [phone]).

## 2020-01-05 NOTE — Anesthesia Postprocedure Evaluation (Signed)
Anesthesia Post Note  Patient: Julie [name]  Procedure(s) Performed: LEFT CARPAL TUNNEL RELEASE (Left Hand)     Patient location during evaluation: PACU Anesthesia Type: MAC and Bier Block Level of consciousness: awake and alert Pain management: pain level controlled Vital Signs Assessment: post-procedure vital signs reviewed and stable Respiratory status: spontaneous breathing, nonlabored ventilation and respiratory function stable Cardiovascular status: blood pressure returned to baseline and stable Postop Assessment: no apparent nausea or vomiting Anesthetic complications: no    Last Vitals:  Vitals:   01/05/20 0940 01/05/20 1118  BP: (!) 143/68 124/90  Pulse: 80 82  Resp: 18 16  Temp: 36.7 C 36.5 C  SpO2: 99% 100%    Last Pain:  Vitals:   01/05/20 1118  TempSrc:   PainSc: 0-No pain                 Pervis Hocking

## 2020-01-05 NOTE — Brief Op Note (Signed)
01/05/2020  11:25 AM  PATIENT:  Julie [name]  54 y.o. female  PRE-OPERATIVE DIAGNOSIS:  LEFT CARPAL TUNNEL SYNDROME  POST-OPERATIVE DIAGNOSIS:  LEFT CARPAL TUNNEL SYNDROME  PROCEDURE:  Procedure(s) with comments: LEFT CARPAL TUNNEL RELEASE (Left) - FOREARM BIER BLOCK  SURGEON:  Surgeon(s) and Role:    * Daryll Brod, MD - Primary  PHYSICIAN ASSISTANT:   ASSISTANTS: none   ANESTHESIA:   local, regional and IV sedation  EBL:  5 mL   BLOOD ADMINISTERED:none  DRAINS: none   LOCAL MEDICATIONS USED:  BUPIVICAINE   SPECIMEN:  No Specimen  DISPOSITION OF SPECIMEN:  N/A  COUNTS:  YES  TOURNIQUET:   Total Tourniquet Time Documented: Forearm (Left) - 25 minutes Total: Forearm (Left) - 25 minutes   DICTATION: .Dragon Dictation  PLAN OF CARE: Discharge to home after PACU  PATIENT DISPOSITION:  PACU - hemodynamically stable.

## 2020-01-05 NOTE — Anesthesia Procedure Notes (Signed)
Anesthesia Regional Block: Bier block (IV Regional)   Pre-Anesthetic Checklist: ,, timeout performed, Correct Patient, Correct Site, Correct Laterality, Correct Procedure, Correct Position, site marked, Risks and benefits discussed, Surgical consent,  Pre-op evaluation,  At surgeon's request  Laterality: Left  Prep: alcohol swabs       Needles:       Needle Gauge: 20     Additional Needles:   Procedures:,,,,, intact distal pulses, Esmarch exsanguination, single tourniquet utilized, #20gu IV placed  Narrative:  Start time: 01/05/2020 10:45 AM End time: 01/05/2020 10:45 AM  Events:,, positive IV test,,,,,,,,  Performed by: Personally  CRNA: Myna Bright, CRNA

## 2020-01-05 NOTE — Progress Notes (Signed)
Pt shows wound on thumb of surgical side. Will inform Dr Fredna Dow. Pt states office was alerted by herself of this.

## 2020-01-05 NOTE — Transfer of Care (Signed)
Immediate Anesthesia Transfer of Care Note  Patient: Julie [name]  Procedure(s) Performed: LEFT CARPAL TUNNEL RELEASE (Left Hand)  Patient Location: PACU  Anesthesia Type:MAC  Level of Consciousness: awake, alert , oriented and patient cooperative  Airway & Oxygen Therapy: Patient Spontanous Breathing and Patient connected to face mask oxygen  Post-op Assessment: Report given to RN, Post -op Vital signs reviewed and stable and Patient moving all extremities  Post vital signs: Reviewed and stable  Last Vitals:  Vitals Value Taken Time  BP 135/102 01/05/20 1116  Temp    Pulse 78 01/05/20 1119  Resp 12 01/05/20 1119  SpO2 100 % 01/05/20 1119  Vitals shown include unvalidated device data.  Last Pain:  Vitals:   01/05/20 0940  TempSrc: Oral         Complications: No apparent anesthesia complications

## 2020-01-06 ENCOUNTER — Encounter: Payer: Self-pay | Admitting: *Deleted

## 2020-01-06 NOTE — Addendum Note (Signed)
Addendum  created 01/06/20 0910 by Myna Bright, CRNA   Charge Capture section accepted

## 2020-01-12 ENCOUNTER — Ambulatory Visit (INDEPENDENT_AMBULATORY_CARE_PROVIDER_SITE_OTHER): Payer: 59

## 2020-01-12 DIAGNOSIS — J4540 Moderate persistent asthma, uncomplicated: Secondary | ICD-10-CM

## 2020-01-19 ENCOUNTER — Ambulatory Visit: Payer: Self-pay

## 2020-01-26 ENCOUNTER — Ambulatory Visit (INDEPENDENT_AMBULATORY_CARE_PROVIDER_SITE_OTHER): Payer: 59

## 2020-01-26 ENCOUNTER — Other Ambulatory Visit: Payer: Self-pay

## 2020-01-26 DIAGNOSIS — J4540 Moderate persistent asthma, uncomplicated: Secondary | ICD-10-CM

## 2020-02-01 ENCOUNTER — Other Ambulatory Visit: Payer: Self-pay

## 2020-02-01 ENCOUNTER — Ambulatory Visit: Payer: 59 | Admitting: Allergy

## 2020-02-01 ENCOUNTER — Encounter: Payer: Self-pay | Admitting: Allergy

## 2020-02-01 VITALS — BP 140/90 | HR 76 | Temp 97.4°F | Resp 18 | Wt 195.4 lb

## 2020-02-01 DIAGNOSIS — K219 Gastro-esophageal reflux disease without esophagitis: Secondary | ICD-10-CM | POA: Diagnosis not present

## 2020-02-01 DIAGNOSIS — T7800XA Anaphylactic reaction due to unspecified food, initial encounter: Secondary | ICD-10-CM | POA: Insufficient documentation

## 2020-02-01 DIAGNOSIS — J3089 Other allergic rhinitis: Secondary | ICD-10-CM

## 2020-02-01 DIAGNOSIS — J4540 Moderate persistent asthma, uncomplicated: Secondary | ICD-10-CM | POA: Diagnosis not present

## 2020-02-01 DIAGNOSIS — T7800XD Anaphylactic reaction due to unspecified food, subsequent encounter: Secondary | ICD-10-CM

## 2020-02-01 DIAGNOSIS — H1010 Acute atopic conjunctivitis, unspecified eye: Secondary | ICD-10-CM

## 2020-02-01 DIAGNOSIS — J302 Other seasonal allergic rhinitis: Secondary | ICD-10-CM

## 2020-02-01 MED ORDER — AZELASTINE HCL 0.1 % NA SOLN: 1.0000 | Freq: Two times a day (BID) | NASAL | 5 refills | Status: DC | PRN

## 2020-02-01 MED ORDER — ALVESCO 80 MCG/ACT IN AERS: 2.0000 | INHALATION_SPRAY | Freq: Two times a day (BID) | RESPIRATORY_TRACT | 0 refills | Status: DC

## 2020-02-01 NOTE — Progress Notes (Signed)
Follow Up Note  RE: Julie Michelle Morden MRN: [id number] DOB: Jul 07, 1966 Date of Office Visit: 02/01/2020  Referring provider: Suella Broad Primary care provider: Suella Broad, FNP  Chief Complaint: Allergic Rhinitis , Sinusitis, and Asthma  History of Present Illness: I had the pleasure of seeing Julie [name] for a follow up visit at the [facility] on 02/01/2020. She is a 54 y.o. female, who is being followed for asthma, LPRD, allergic rhinitis and food allergies. Her previous allergy office visit was on 11/17/2019 with Dr. Ernst Bowler. Today is a new complaint visit of allergy flare due to dogs.  1. Moderate persistent asthma without complication Currently on Dupixent injections every 2 weeks and Singulair daily. Not on daily steroid inhaler as there was a concern whether her voice changes were due to the Qvar or not. She stopped Qvar since the last visit with no improvement in her voice. She is scheduled to see ENT.  Not using albuterol but noted some chest tightness with coughing since she was exposed to dog dander over the weekend. No recent prednisone use.   2. LPRD (laryngopharyngeal reflux disease) Takes Dexilant 60mg  daily and famotidine 20mg  at night.   3. Seasonal allergic rhinitis Patient has been having issues with her sinuses and itchy eyes. She was over someone's house with a dog on Saturday and since then was having issues with breathing.  Taking Xyzal and Singulair daily.  4. Food allergies Currently avoiding peanuts and tree nuts. No accidental ingestions.   Assessment and Plan: Julie is a 54 y.o. female with: Moderate persistent asthma without complication Flaring of symptoms since around dog over the weekend. Stopped Qvar since March 2021 with no improvement in her voice. Back on Dupixent Q2 weeks.  Today's spirometry showed some restriction with no improvement in FEV1 post bronchodilator treatment. Clinically  feeling improved.  . Start prednisone taper o Prednisone 10mg  tablets - take 2 tablets for 4 days then 1 tablet on day 5.  . Daily controller medication(s): stat Alvesco 22mcg 2 puffs twice a day with spacer and rinse mouth afterwards. Discussed with patient that this is a prodrug and has less side effects of voice changes and thrush.  o Continue with Dupixent injections every 2 weeks.  o Continue Singulair (montelukast) 10mg  daily at night.  . Prior to physical activity: May use albuterol rescue inhaler 2 puffs 5 to 15 minutes prior to strenuous physical activities. Marland Kitchen Rescue medications: May use albuterol rescue inhaler 2 puffs or nebulizer every 4 to 6 hours as needed for shortness of breath, chest tightness, coughing, and wheezing. Monitor frequency of use.  . Repeat spirometry at next visit. . Follow up with ENT as scheduled.   Seasonal and perennial allergic rhinoconjunctivitis Past history - Past history - 1995 skin testing was positive to grass, ragweed, weed, trees, mold, dust mites, cat, dog, feathers, horse. 2002 skin testing positive to grass, ragweed, weed, trees, mold, dust mites, cat and dog. 2021 blood work was positive to cat, dog, grass, mold, trees, weed and ragweed. Interim history - worsening sinus symptoms and itchy eyes since exposed to dog over the weekend.  Continue with levocetirizine (Xyzal) 5mg  daily and may take twice a day if needed.   Start azelastine nasal spray 1-2 sprays per nostril twice a day for drainage.  Continue with Flonase 1 spray per nostril 1-2 times a day for nasal congestion.  Continue environmental control measures.   LPRD (laryngopharyngeal reflux disease)  Continue with  Dexilant 60mg  daily.   Continue with famotidine 20mg  at night.   Anaphylactic shock due to adverse food reaction Past history - 2002 skin testing was positive to peanut, corn, shellfish, peas, carrots, shrimp, cantaloupe and watermelon. 2021 blood work positive to peanut  with ara 2, and sesame. negative to tree nuts.  Interim history - no reactions since the lat visit.  Continue to avoid peanuts and tree nuts.  For mild symptoms you can take over the counter antihistamines such as Benadryl and monitor symptoms closely. If symptoms worsen or if you have severe symptoms including breathing issues, throat closure, significant swelling, whole body hives, severe diarrhea and vomiting, lightheadedness then inject epinephrine and seek immediate medical care afterwards.  Consider skin testing for peanuts and tree nuts in future.   Return in about 4 months (around 06/03/2020).  Meds ordered this encounter  Medications  . azelastine (ASTELIN) 0.1 % nasal spray    Sig: Place 1-2 sprays into both nostrils 2 (two) times daily as needed (runny nose, drainage). Use in each nostril as directed    Dispense:  30 mL    Refill:  5  . ciclesonide (ALVESCO) 80 MCG/ACT inhaler    Sig: Inhale 2 puffs into the lungs 2 (two) times daily. with spacer and rinse mouth afterwards.    Dispense:  1 Inhaler    Refill:  0   Diagnostics: Spirometry:  Tracings reviewed. Her effort: It was hard to get consistent efforts and there is a question as to whether this reflects a maximal maneuver. FVC: 1.81L FEV1: 1.52L, 75% predicted FEV1/FVC ratio: 84% Interpretation: Spirometry consistent with possible restrictive disease with no improvement in FEV1 post bronchodilator treatment. Clinically feeling improved.  Please see scanned spirometry results for details.  Medication List:  Current Outpatient Medications  Medication Sig Dispense Refill  . albuterol (PROAIR HFA) 108 (90 Base) MCG/ACT inhaler Inhale 2 puffs into the lungs every 4 (four) hours as needed. 18 g 1  . levocetirizine (XYZAL) 5 MG tablet Take 5 mg by mouth every evening.     . montelukast (SINGULAIR) 10 MG tablet Take 10 mg by mouth daily.    Marland Kitchen azelastine (ASTELIN) 0.1 % nasal spray Place 1-2 sprays into both nostrils 2 (two)  times daily as needed (runny nose, drainage). Use in each nostril as directed 30 mL 5  . B-D TB SYRINGE 1CC/27GX1/2" 27G X 1/2" 1 ML MISC Use to inject Methotrexate weekly. 12 each 3  . busPIRone (BUSPAR) 5 MG tablet Take 5 mg by mouth 3 (three) times daily as needed.    . ciclesonide (ALVESCO) 80 MCG/ACT inhaler Inhale 2 puffs into the lungs 2 (two) times daily. with spacer and rinse mouth afterwards. 1 Inhaler 0  . dexlansoprazole (DEXILANT) 60 MG capsule TAKE 1 CAPSULE BY MOUTH IN THE MORNING TO PREVENT REFLUX 30 capsule 5  . diclofenac Sodium (VOLTAREN) 1 % GEL Apply 2g to 4g to affected area up to 4 times daily as needed. 400 g 4  . EPINEPHrine 0.3 mg/0.3 mL IJ SOAJ injection Inject 0.3 mLs (0.3 mg total) into the muscle as needed for anaphylaxis. 2 each 2  . escitalopram (LEXAPRO) 20 MG tablet Take 20 mg by mouth daily.  5  . famotidine (PEPCID) 40 MG tablet Take 1 tablet (40 mg total) by mouth daily. 30 tablet 0  . folic acid (FOLVITE) 1 MG tablet Take 1 tablet (1 mg total) by mouth 2 (two) times daily. 180 tablet 4  . hydroxychloroquine (PLAQUENIL)  200 MG tablet TAKE 1 TABLET BY MOUTH TWICE A DAY 60 tablet 2  . methotrexate 50 MG/2ML injection INJECT 1.0 ML SUBCUTANEOUSLY ONCE WEEKLY. 4 mL 2  . Methotrexate, PF, 25 MG/0.5ML SOAJ Inject 25 mg into the skin once a week. 4 pen 2  . Multiple Vitamins-Minerals (DAILY MULTIVITAMIN PO) Take 1 tablet by mouth daily.     . traMADol (ULTRAM) 50 MG tablet Take 1 tablet (50 mg total) by mouth every 6 (six) hours as needed. 20 tablet 0   Current Facility-Administered Medications  Medication Dose Route Frequency Provider Last Rate Last Admin  . Dupilumab SOSY 300 mg  300 mg Subcutaneous Q14 Days Dara Hoyer, FNP   300 mg at 01/26/20 1647   Allergies: Allergies  Allergen Reactions  . Aspirin Other (See Comments)    pt couldn't take while on methotrexate, but is no longer taking  . Augmentin [Amoxicillin-Pot Clavulanate] Other (See Comments)    G  I upset  . Peanut-Containing Drug Products    I reviewed her past medical history, social history, family history, and environmental history and no significant changes have been reported from her previous visit.  Review of Systems  Constitutional: Negative for appetite change, chills, fever and unexpected weight change.  HENT: Positive for congestion and voice change. Negative for rhinorrhea.   Eyes: Negative for itching.  Respiratory: Positive for cough and chest tightness. Negative for shortness of breath and wheezing.   Gastrointestinal: Negative for abdominal pain.  Skin: Negative for rash.  Allergic/Immunologic: Positive for environmental allergies and food allergies.  Neurological: Negative for headaches.   Objective: BP 140/90 (BP Location: Right Arm, Patient Position: Sitting, Cuff Size: Normal)   Pulse 76   Temp (!) 97.4 F (36.3 C) (Temporal)   Resp 18   Wt 195 lb 6.4 oz (88.6 kg)   LMP 05/19/2015   SpO2 98%   BMI 36.62 kg/m  Body mass index is 36.62 kg/m. Physical Exam  Constitutional: She is oriented to person, place, and time. She appears well-developed and well-nourished.  HENT:  Head: Normocephalic and atraumatic.  Right Ear: External ear normal.  Left Ear: External ear normal.  Nose: Nose normal.  Mouth/Throat: Oropharynx is clear and moist.  No uvula  Eyes: Conjunctivae and EOM are normal.  Cardiovascular: Normal rate, regular rhythm and normal heart sounds. Exam reveals no gallop and no friction rub.  No murmur heard. Pulmonary/Chest: Effort normal and breath sounds normal. She has no wheezes. She has no rales.  Musculoskeletal:     Cervical back: Neck supple.  Neurological: She is alert and oriented to person, place, and time.  Skin: Skin is warm. No rash noted.  Psychiatric: She has a normal mood and affect. Her behavior is normal.  Nursing note and vitals reviewed.  Previous notes and tests were reviewed. The plan was reviewed with the  patient/family, and all questions/concerned were addressed.  It was my pleasure to see Julie today and participate in her care. Please feel free to contact me with any questions or concerns.  Sincerely,  Rexene Alberts, DO Allergy & Immunology  [facility] office: [phone] [facility] office: [place] office: [phone]

## 2020-02-01 NOTE — Assessment & Plan Note (Signed)
Past history - 2002 skin testing was positive to peanut, corn, shellfish, peas, carrots, shrimp, cantaloupe and watermelon. 2021 blood work positive to peanut with ara 2, and sesame. negative to tree nuts.  Interim history - no reactions since the lat visit.  Continue to avoid peanuts and tree nuts.  For mild symptoms you can take over the counter antihistamines such as Benadryl and monitor symptoms closely. If symptoms worsen or if you have severe symptoms including breathing issues, throat closure, significant swelling, whole body hives, severe diarrhea and vomiting, lightheadedness then inject epinephrine and seek immediate medical care afterwards.  Consider skin testing for peanuts and tree nuts in future.

## 2020-02-01 NOTE — Patient Instructions (Addendum)
1. Moderate persistent asthma . Start prednisone taper o Prednisone 10mg  tablets - take 2 tablets for 4 days then 1 tablet on day 5.  . Daily controller medication(s): stat Alvesco 80mcg 2 puffs twice a day with spacer and rinse mouth afterwards - this will be mailed to you from a [facility], community pharmacy from [place].  o Continue with Dupixent injections every 2 weeks.  o Continue Singulair (montelukast) 10mg  daily at night.  . Prior to physical activity: May use albuterol rescue inhaler 2 puffs 5 to 15 minutes prior to strenuous physical activities. Marland Kitchen Rescue medications: May use albuterol rescue inhaler 2 puffs or nebulizer every 4 to 6 hours as needed for shortness of breath, chest tightness, coughing, and wheezing. Monitor frequency of use.  . Asthma control goals:  o Full participation in all desired activities (may need albuterol before activity) o Albuterol use two times or less a week on average (not counting use with activity) o Cough interfering with sleep two times or less a month o Oral steroids no more than once a year o No hospitalizations  2. LPRD (laryngopharyngeal reflux disease)  Continue with Dexilant 60mg  daily.   Continue with famotidine 20mg  at night.   3. Seasonal allergic rhinitis  Continue with levocetirizine (Xyzal) 5mg  daily and may take twice a day if needed.   Start azelastine nasal spray 1-2 sprays per nostril twice a day for drainage.  Continue with Flonase 1 spray per nostril 1-2 times a day for nasal congestion.  Continue environmental control measures.   4. Food allergies  Continue to avoid peanuts and tree nuts.  For mild symptoms you can take over the counter antihistamines such as Benadryl and monitor symptoms closely. If symptoms worsen or if you have severe symptoms including breathing issues, throat closure, significant swelling, whole body hives, severe diarrhea and vomiting, lightheadedness then inject epinephrine and seek immediate  medical care afterwards.  Consider skin testing for peanuts and tree nuts in future.   Follow up with Dr. Ernst Bowler as scheduled.  Keep ENT appointment.

## 2020-02-01 NOTE — Assessment & Plan Note (Signed)
Flaring of symptoms since around dog over the weekend. Stopped Qvar since March 2021 with no improvement in her voice. Back on Dupixent Q2 weeks.  Today's spirometry showed some restriction with no improvement in FEV1 post bronchodilator treatment. Clinically feeling improved.  . Start prednisone taper o Prednisone 10mg  tablets - take 2 tablets for 4 days then 1 tablet on day 5.  . Daily controller medication(s): stat Alvesco 31mcg 2 puffs twice a day with spacer and rinse mouth afterwards. Discussed with patient that this is a prodrug and has less side effects of voice changes and thrush.  o Continue with Dupixent injections every 2 weeks.  o Continue Singulair (montelukast) 10mg  daily at night.  . Prior to physical activity: May use albuterol rescue inhaler 2 puffs 5 to 15 minutes prior to strenuous physical activities. Marland Kitchen Rescue medications: May use albuterol rescue inhaler 2 puffs or nebulizer every 4 to 6 hours as needed for shortness of breath, chest tightness, coughing, and wheezing. Monitor frequency of use.  . Repeat spirometry at next visit. . Follow up with ENT as scheduled.

## 2020-02-01 NOTE — Assessment & Plan Note (Addendum)
Past history - Past history - 1995 skin testing was positive to grass, ragweed, weed, trees, mold, dust mites, cat, dog, feathers, horse. 2002 skin testing positive to grass, ragweed, weed, trees, mold, dust mites, cat and dog. 2021 blood work was positive to cat, dog, grass, mold, trees, weed and ragweed. Interim history - worsening sinus symptoms and itchy eyes since exposed to dog over the weekend.  Continue with levocetirizine (Xyzal) 5mg  daily and may take twice a day if needed.   Start azelastine nasal spray 1-2 sprays per nostril twice a day for drainage.  Continue with Flonase 1 spray per nostril 1-2 times a day for nasal congestion.  Continue environmental control measures.

## 2020-02-01 NOTE — Assessment & Plan Note (Signed)
   Continue with Dexilant 60mg  daily.   Continue with famotidine 20mg  at night.

## 2020-02-05 ENCOUNTER — Encounter: Payer: Self-pay | Admitting: Allergy & Immunology

## 2020-02-08 ENCOUNTER — Other Ambulatory Visit: Payer: Self-pay | Admitting: *Deleted

## 2020-02-09 ENCOUNTER — Other Ambulatory Visit: Payer: Self-pay | Admitting: *Deleted

## 2020-02-09 ENCOUNTER — Telehealth: Payer: Self-pay | Admitting: *Deleted

## 2020-02-09 NOTE — Telephone Encounter (Signed)
PA was denied for Alvesco, covered alternatives are Breo, Spiriva, Trelegy, Advair HFA, Breztri, Flovent, Arnuity, Pulmicort, and Atrovent. Please advise change in inhaler.

## 2020-02-09 NOTE — Telephone Encounter (Signed)
Can you call the following [facility]? Phone number is [phone]. [address], [address] [place], [place] [address]  According to the rep, the coupon is $0 copay for [employer] and if no insurance then it's $50 for 1-2 inhalers.  And the [facility] above is the only place near [place] that knows how to do this and they mail the inhaler to the patient.

## 2020-02-09 NOTE — Telephone Encounter (Signed)
PA has been submitted through CoverMyMeds for Alvesco 84mcg and is currently pending approval or denial.

## 2020-02-10 ENCOUNTER — Telehealth: Payer: Self-pay | Admitting: Allergy

## 2020-02-10 NOTE — Telephone Encounter (Signed)
Patient states her insurance is not covering the nose spray prescribed to her, please advise

## 2020-02-10 NOTE — Telephone Encounter (Signed)
Will work on this shortly.

## 2020-02-11 MED ORDER — ALVESCO 80 MCG/ACT IN AERS: 2.0000 | INHALATION_SPRAY | Freq: Two times a day (BID) | RESPIRATORY_TRACT | 1 refills | Status: DC

## 2020-02-11 NOTE — Telephone Encounter (Signed)
Called the pharmacy and was told insurance does cover the nasal spray (Astelin) but her co-pay is $51.21. Called to inform patient, no answer, left a message for her to callback.

## 2020-02-11 NOTE — Telephone Encounter (Signed)
Patient called back and has been informed of the co-pay for the [place] and will get it from [place]. Patient was originally calling about the Alvesco prescription. I did let her know per the other message that it would be sent to a [facility] that's where the coupon that she has can be used. Patient asked if the medication will be mailed to her, I stated it would.    I will call the pharmacy after I send in the prescription to see if anything else is needed and will call the patient back.

## 2020-02-16 ENCOUNTER — Ambulatory Visit (INDEPENDENT_AMBULATORY_CARE_PROVIDER_SITE_OTHER): Payer: 59

## 2020-02-16 ENCOUNTER — Other Ambulatory Visit: Payer: Self-pay

## 2020-02-16 DIAGNOSIS — J4540 Moderate persistent asthma, uncomplicated: Secondary | ICD-10-CM

## 2020-02-22 ENCOUNTER — Other Ambulatory Visit: Payer: Self-pay | Admitting: Family

## 2020-02-22 DIAGNOSIS — Z1231 Encounter for screening mammogram for malignant neoplasm of breast: Secondary | ICD-10-CM

## 2020-02-23 ENCOUNTER — Other Ambulatory Visit: Payer: Self-pay | Admitting: *Deleted

## 2020-02-23 DIAGNOSIS — Z79899 Other long term (current) drug therapy: Secondary | ICD-10-CM

## 2020-02-24 ENCOUNTER — Encounter: Payer: Self-pay | Admitting: *Deleted

## 2020-02-24 ENCOUNTER — Telehealth: Payer: Self-pay | Admitting: *Deleted

## 2020-02-24 DIAGNOSIS — Z79899 Other long term (current) drug therapy: Secondary | ICD-10-CM

## 2020-02-24 LAB — COMPLETE METABOLIC PANEL WITH GFR
AG Ratio: 1.3 (calc) (ref 1.0–2.5)
ALT: 13 U/L (ref 6–29)
AST: 15 U/L (ref 10–35)
Albumin: 3.9 g/dL (ref 3.6–5.1)
Alkaline phosphatase (APISO): 74 U/L (ref 37–153)
BUN: 11 mg/dL (ref 7–25)
CO2: 25 mmol/L (ref 20–32)
Calcium: 9.1 mg/dL (ref 8.6–10.4)
Chloride: 106 mmol/L (ref 98–110)
Creat: 0.65 mg/dL (ref 0.50–1.05)
GFR, Est African American: 119 mL/min/{1.73_m2} (ref >=60)
GFR, Est Non African American: 103 mL/min/{1.73_m2} (ref >=60)
Globulin: 2.9 g/dL (calc) (ref 1.9–3.7)
Glucose, Bld: 95 mg/dL (ref 65–99)
Potassium: 4.2 mmol/L (ref 3.5–5.3)
Sodium: 140 mmol/L (ref 135–146)
Total Bilirubin: 0.4 mg/dL (ref 0.2–1.2)
Total Protein: 6.8 g/dL (ref 6.1–8.1)

## 2020-02-24 LAB — CBC WITH DIFFERENTIAL/PLATELET
Absolute Monocytes: 258 cells/uL (ref 200–950)
Basophils Absolute: 41 cells/uL (ref 0–200)
Basophils Relative: 1.2 %
Eosinophils Absolute: 109 cells/uL (ref 15–500)
Eosinophils Relative: 3.2 %
HCT: 38.5 % (ref 35.0–45.0)
Hemoglobin: 12.6 g/dL (ref 11.7–15.5)
Lymphs Abs: 1499 cells/uL (ref 850–3900)
MCH: 27.4 pg (ref 27.0–33.0)
MCHC: 32.7 g/dL (ref 32.0–36.0)
MCV: 83.7 fL (ref 80.0–100.0)
MPV: 11.3 fL (ref 7.5–12.5)
Monocytes Relative: 7.6 %
Neutro Abs: 1493 cells/uL — ABNORMAL LOW (ref 1500–7800)
Neutrophils Relative %: 43.9 %
Platelets: 254 10*3/uL (ref 140–400)
RBC: 4.6 10*6/uL (ref 3.80–5.10)
RDW: 11.9 % (ref 11.0–15.0)
Total Lymphocyte: 44.1 %
WBC: 3.4 10*3/uL — ABNORMAL LOW (ref 3.8–10.8)

## 2020-02-24 NOTE — Telephone Encounter (Signed)
Discontinued in med list.

## 2020-02-24 NOTE — Telephone Encounter (Signed)
-----   Message from Bo Merino, MD sent at 02/24/2020 12:35 PM EDT ----- CMP is normal.  White cell count is mildly decreased.  Patient was clinically doing well.  She may discontinue Plaquenil.  Repeat CBC in 1 month.

## 2020-02-24 NOTE — Progress Notes (Signed)
CMP is normal.  White cell count is mildly decreased.  Patient was clinically doing well.  She may discontinue Plaquenil.  Repeat CBC in 1 month.

## 2020-02-25 ENCOUNTER — Other Ambulatory Visit: Payer: Self-pay | Admitting: Rheumatology

## 2020-02-25 DIAGNOSIS — M0579 Rheumatoid arthritis with rheumatoid factor of multiple sites without organ or systems involvement: Secondary | ICD-10-CM

## 2020-03-03 ENCOUNTER — Other Ambulatory Visit: Payer: Self-pay

## 2020-03-03 ENCOUNTER — Ambulatory Visit (INDEPENDENT_AMBULATORY_CARE_PROVIDER_SITE_OTHER): Payer: 59

## 2020-03-03 DIAGNOSIS — J4540 Moderate persistent asthma, uncomplicated: Secondary | ICD-10-CM

## 2020-03-17 ENCOUNTER — Other Ambulatory Visit: Payer: Self-pay

## 2020-03-17 ENCOUNTER — Ambulatory Visit (INDEPENDENT_AMBULATORY_CARE_PROVIDER_SITE_OTHER): Payer: 59

## 2020-03-17 DIAGNOSIS — J4540 Moderate persistent asthma, uncomplicated: Secondary | ICD-10-CM | POA: Diagnosis not present

## 2020-03-18 NOTE — Progress Notes (Signed)
Office Visit Note  Patient: Julie [name]             Date of Birth: [birth year]           MRN: [id number]             PCP: Suella Broad, FNP Referring: Suella Broad,* Visit Date: 03/31/2020 Occupation: @GUAROCC @  Subjective:  Medication monitoring  History of Present Illness: Julie [name] is a 54 y.o. female with history of seropositive rheumatoid arthritis and fibromyalgia.  Patient is currently on methotrexate 1.0 mL subcutaneous injections once weekly and folic acid 2 mg by mouth daily. She has been holding PLQ for 1 month due to low WBC count.  She states she has not noticed any increased joint pain or inflammation since stopping Plaquenil.  She denies any recent rheumatoid arthritis flares.  She denies any joint pain or joint swelling at this time.  She states that she had recent lab work drawn by her allergist and was found to have a positive ANA and positive RNP and is unsure of the significance.  Activities of Daily Living:  Patient reports morning stiffness for  0 minutes.   Patient Reports nocturnal pain.  Difficulty dressing/grooming: Denies Difficulty climbing stairs: Denies Difficulty getting out of chair: Denies Difficulty using hands for taps, buttons, cutlery, and/or writing: Denies  Review of Systems  Constitutional: Positive for fatigue.  HENT: Negative for mouth sores, mouth dryness and nose dryness.   Eyes: Negative for pain, visual disturbance and dryness.  Respiratory: Positive for cough. Negative for hemoptysis, shortness of breath and difficulty breathing.   Cardiovascular: Negative for chest pain, palpitations, hypertension and swelling in legs/feet.  Gastrointestinal: Negative for blood in stool, constipation and diarrhea.  Endocrine: Negative for increased urination.  Genitourinary: Negative for painful urination.  Musculoskeletal: Negative for arthralgias, joint pain, joint swelling, myalgias, muscle weakness,  morning stiffness, muscle tenderness and myalgias.  Skin: Negative for color change, pallor, rash, hair loss, nodules/bumps, skin tightness, ulcers and sensitivity to sunlight.  Allergic/Immunologic: Negative for susceptible to infections.  Neurological: Negative for dizziness, numbness, headaches and weakness.  Hematological: Negative for swollen glands.  Psychiatric/Behavioral: Negative for depressed mood and sleep disturbance. The patient is not nervous/anxious.     PMFS History:  Patient Active Problem List   Diagnosis Date Noted  . Anaphylactic shock due to adverse food reaction 02/01/2020  . Moderate persistent asthma without complication 61/60/7371  . Complication of anesthesia   . Fibromyalgia 02/14/2018  . History of gastroesophageal reflux (GERD) 02/14/2018  . History of asthma 02/14/2018  . Rheumatoid arthritis involving multiple sites with positive rheumatoid factor ([employer]) 11/05/2016  . High risk medication use 11/05/2016  . LPRD (laryngopharyngeal reflux disease) 09/20/2015  . Seasonal and perennial allergic rhinoconjunctivitis 09/20/2015  . Acute sinusitis 07/29/2015  . S/P total hysterectomy 05/25/2015    Past Medical History:  Diagnosis Date  . Anemia   . Asthma   . Complication of anesthesia   . Eczema   . GERD (gastroesophageal reflux disease)   . PONV (postoperative nausea and vomiting)   . Rheumatoid arthritis ([place])     Family History  Problem Relation Age of Onset  . Asthma Mother   . Eczema Mother   . Sarcoidosis Mother   . Hypertension Mother   . Cancer Father   . Hypotension Father   . Congestive Heart Failure Father   . Colon cancer Father   . Asthma Sister   . GER disease Sister   .  Allergic rhinitis Neg Hx   . Angioedema Neg Hx   . Immunodeficiency Neg Hx   . Urticaria Neg Hx   . Esophageal cancer Neg Hx   . Rectal cancer Neg Hx   . Stomach cancer Neg Hx    Past Surgical History:  Procedure Laterality Date  . ABDOMINAL HYSTERECTOMY       partial age 69  . BILATERAL SALPINGECTOMY Bilateral 05/25/2015   Procedure: BILATERAL SALPINGECTOMY;  Surgeon: Servando Salina, MD;  Location: [facility];  Service: Gynecology;  Laterality: Bilateral;  . CARPAL TUNNEL RELEASE Left 01/05/2020   Procedure: LEFT CARPAL TUNNEL RELEASE;  Surgeon: Daryll Brod, MD;  Location: [facility];  Service: Orthopedics;  Laterality: Left;  FOREARM BIER BLOCK  . COLONOSCOPY    . OVARIAN CYST REMOVAL Right 05/25/2015   Procedure: OVARIAN CYSTECTOMY;  Surgeon: Servando Salina, MD;  Location: [facility];  Service: Gynecology;  Laterality: Right;  . TONSILLECTOMY    . TUBAL LIGATION     Social History   Social History Narrative  . Not on file   Immunization History  Administered Date(s) Administered  . Influenza Inj Mdck Quad Pf 10/19/2017  . Influenza, Seasonal, Injecte, Preservative Fre 09/20/2015     Objective: Vital Signs: BP (!) 157/109 (BP Location: Right Arm, Patient Position: Sitting, Cuff Size: Normal)   Pulse 72   Resp 15   Ht 5' 1.25" (1.556 m)   Wt 192 lb 6.4 oz (87.3 kg)   LMP 05/19/2015   BMI 36.06 kg/m    Physical Exam Vitals and nursing note reviewed.  Constitutional:      Appearance: She is well-developed.  HENT:     Head: Normocephalic and atraumatic.  Eyes:     Conjunctiva/sclera: Conjunctivae normal.  Pulmonary:     Effort: Pulmonary effort is normal.  Abdominal:     General: Bowel sounds are normal.     Palpations: Abdomen is soft.  Musculoskeletal:     Cervical back: Normal range of motion.  Lymphadenopathy:     Cervical: No cervical adenopathy.  Skin:    General: Skin is warm and dry.     Capillary Refill: Capillary refill takes less than 2 seconds.  Neurological:     Mental Status: She is alert and oriented to person, place, and time.  Psychiatric:        Behavior: Behavior normal.      Musculoskeletal Exam: C-spine, thoracic spine, lumbar spine have good range of motion.  Shoulder joints, elbow  joints, wrist joints, MCPs, PIPs, DIPs have good range of motion with no synovitis.  She is able to make a complete fist bilaterally.  Hip joints have good range of motion with no discomfort.  No tenderness over trochanteric bursa bilaterally.  Knee joints have good range of motion with no warmth or effusion.  Ankle joints have good range of motion with no tenderness or inflammation.  No tenderness of MTP joints.  CDAI Exam: CDAI Score: -- Patient Global: --; Provider Global: -- Swollen: --; Tender: -- Joint Exam 03/31/2020   No joint exam has been documented for this visit   There is currently no information documented on the homunculus. Go to the Rheumatology activity and complete the homunculus joint exam.  Investigation: No additional findings.  Imaging: MM 3D SCREEN BREAST BILATERAL  Result Date: 03/29/2020 CLINICAL DATA:  Screening. EXAM: DIGITAL SCREENING BILATERAL MAMMOGRAM WITH TOMO AND CAD COMPARISON:  Previous exam(s). ACR Breast Density Category b: There are scattered areas of fibroglandular density.  FINDINGS: There are no findings suspicious for malignancy. Images were processed with CAD. IMPRESSION: No mammographic evidence of malignancy. A result letter of this screening mammogram will be mailed directly to the patient. RECOMMENDATION: Screening mammogram in one year. (Code:SM-B-01Y) BI-RADS CATEGORY  1: Negative. Electronically Signed   By: Abelardo Diesel M.D.   On: 03/29/2020 13:20    Recent Labs: Lab Results  Component Value Date   WBC 3.4 (L) 02/23/2020   HGB 12.6 02/23/2020   PLT 254 02/23/2020   NA 140 02/23/2020   K 4.2 02/23/2020   CL 106 02/23/2020   CO2 25 02/23/2020   GLUCOSE 95 02/23/2020   BUN 11 02/23/2020   CREATININE 0.65 02/23/2020   BILITOT 0.4 02/23/2020   ALKPHOS 55 03/26/2017   AST 15 02/23/2020   ALT 13 02/23/2020   PROT 6.8 02/23/2020   ALBUMIN 3.7 03/26/2017   CALCIUM 9.1 02/23/2020   GFRAA 119 02/23/2020    Speciality Comments: PLQ  Eye Exam: 05/05/2019 WNL at [facility] Follow up in 1 year  Procedures:  No procedures performed Allergies: Aspirin, Augmentin [amoxicillin-pot clavulanate], and Peanut-containing drug products   Assessment / Plan:     Visit Diagnoses: Rheumatoid arthritis involving multiple sites with positive rheumatoid factor ([place]) - +RF, +CCP: She has no tenderness or synovitis on exam.  She has not had any recent rheumatoid arthritis flares.  She is clinically doing well on methotrexate 1.0 mL sq injections once weekly and folic acid 2 mg by mouth daily.  She has been holding Plaquenil for the past 1 month due to low white blood cell count.  Her white blood cell count was 3.4 on 02/23/2020.  Repeat CBC ordered today.  She has not noticed any increased joint pain or inflammation since discontinuing Plaquenil.  She may not need to add Plaquenil back since her arthritis seems well controlled on the current dose of methotrexate.  We will update x-rays of hands and feet today to assess for radiographic progression.  She will continue on methotrexate 1.0 mL subcutaneous injections once weekly and folic acid 2 mg by mouth daily.  She was advised to notify us if she develops increased joint pain or joint swelling while being off of Plaquenil.  She will follow-up in the office in 3 months.- Plan: XR Hand 2 View Right, XR Hand 2 View Left, XR Foot 2 Views Right, XR Foot 2 Views Left.  X-ray of bilateral hands were obtained today which showed cystic changes versus erosive changes in the radius bilaterally.  X-ray of bilateral feet were consistent with erosive arthritis and no radiographic progression when compared to the x-rays of 2018.  High risk medication use - Methotrexate 1.0 ml sq injections every 7 days and folic acid 2 mg daily.   discontinued Plaquenil 200 mg 1 tablet twice daily due to low WBC count.  PLQ Eye Exam: 05/05/2019.  CBC and CMP were drawn on 02/23/2020.  She is due to update lab work today.  Orders were  released.  She will return for lab work in October and every 3 months to monitor for drug toxicity.- Plan: COMPLETE METABOLIC PANEL WITH GFR, CBC with Differential/Platelet  Fibromyalgia: She has intermittent muscles tenderness and myalgias with fibromyalgia.  Her last flare was about 3 weeks ago.  She has been under tremendous amount of stress and grief since losing her father and stepmother within the past 1 year.  She is not currently having any muscle tenderness.  She continues to have chronic  fatigue which has been stable.  She has had some difficulty sleeping at night.  We discussed the importance of regular exercise and good sleep hygiene.  Positive ANA (antinuclear antibody) - 11/17/19: ANA positive (no titer), double-stranded DNA negative, RNP 2.8, Smith negative, SCL 70 -, Ro negative, Lyme negative, chromatin antibody negative, anti-Jo 1 -, centromere negative: Recent lab results obtained on 11/17/2019 reviewed today in the office.  We will repeat ANA and RNP with her next lab work in October 2021.  Future orders were placed today.  At that time we will be able to determine the significance of these positive tests.: Plan: ANA, RNP Antibody, Anti-DNA antibody, double-stranded, C3 and C4, Sedimentation rate  Anti-RNP antibodies present -RNP was 2.8 on 11/17/2019.  Future order for RNP was placed today.  Plan: ANA, RNP Antibody, Anti-DNA antibody, double-stranded, C3 and C4, Sedimentation rate  Trigger middle finger of right hand - Resolved  Left hand paresthesia -NCV with EMG performed on 11/13/2019 by Dr. Ernestina Patches.  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  She has surgical release performed.  Her paresthesias have resolved.  She is currently being followed by Dr. Fredna Dow for similar symptoms in her right hand.  Osteopenia of spine - DEXA on 08/24/19: AP spine BMD 0.090 with T-score -1.3.  She will be due to update DEXA in December 2022.  Other medical conditions are listed  as follows  History of asthma  History of depression  History of gastroesophageal reflux (GERD)  Orders: Orders Placed This Encounter  Procedures  . XR Hand 2 View Right  . XR Hand 2 View Left  . XR Foot 2 Views Right  . XR Foot 2 Views Left  . COMPLETE METABOLIC PANEL WITH GFR  . CBC with Differential/Platelet  . ANA  . RNP Antibody  . Anti-DNA antibody, double-stranded  . C3 and C4  . Sedimentation rate   No orders of the defined types were placed in this encounter.   Face-to-face time spent with patient was 30 minutes. Greater than 50% of time was spent in counseling and coordination of care.  Follow-Up Instructions: Return in about 3 months (around 07/01/2020) for Rheumatoid arthritis, Fibromyalgia.   Ofilia Neas, PA-C  Note - This record has been created using Dragon software.  Chart creation errors have been sought, but may not always  have been located. Such creation errors do not reflect on  the standard of medical care.

## 2020-03-25 ENCOUNTER — Other Ambulatory Visit: Payer: Self-pay

## 2020-03-25 ENCOUNTER — Ambulatory Visit
Admission: RE | Admit: 2020-03-25 | Discharge: 2020-03-25 | Disposition: A | Discharge status: Home / Self Care | Payer: 59 | Source: Ambulatory Visit | Attending: Family | Admitting: Family

## 2020-03-25 DIAGNOSIS — Z1231 Encounter for screening mammogram for malignant neoplasm of breast: Secondary | ICD-10-CM

## 2020-03-31 ENCOUNTER — Ambulatory Visit: Payer: Self-pay

## 2020-03-31 ENCOUNTER — Other Ambulatory Visit: Payer: Self-pay

## 2020-03-31 ENCOUNTER — Encounter: Payer: Self-pay | Admitting: Physician Assistant

## 2020-03-31 ENCOUNTER — Ambulatory Visit (INDEPENDENT_AMBULATORY_CARE_PROVIDER_SITE_OTHER): Payer: 59

## 2020-03-31 ENCOUNTER — Ambulatory Visit: Payer: 59 | Admitting: Physician Assistant

## 2020-03-31 VITALS — BP 157/109 | HR 72 | Resp 15 | Ht 61.25 in | Wt 192.4 lb

## 2020-03-31 DIAGNOSIS — M8588 Other specified disorders of bone density and structure, other site: Secondary | ICD-10-CM

## 2020-03-31 DIAGNOSIS — Z8659 Personal history of other mental and behavioral disorders: Secondary | ICD-10-CM

## 2020-03-31 DIAGNOSIS — M0579 Rheumatoid arthritis with rheumatoid factor of multiple sites without organ or systems involvement: Secondary | ICD-10-CM

## 2020-03-31 DIAGNOSIS — Z79899 Other long term (current) drug therapy: Secondary | ICD-10-CM

## 2020-03-31 DIAGNOSIS — M65331 Trigger finger, right middle finger: Secondary | ICD-10-CM

## 2020-03-31 DIAGNOSIS — Z8709 Personal history of other diseases of the respiratory system: Secondary | ICD-10-CM

## 2020-03-31 DIAGNOSIS — R202 Paresthesia of skin: Secondary | ICD-10-CM

## 2020-03-31 DIAGNOSIS — J4540 Moderate persistent asthma, uncomplicated: Secondary | ICD-10-CM | POA: Diagnosis not present

## 2020-03-31 DIAGNOSIS — Z8719 Personal history of other diseases of the digestive system: Secondary | ICD-10-CM

## 2020-03-31 DIAGNOSIS — R768 Other specified abnormal immunological findings in serum: Secondary | ICD-10-CM | POA: Diagnosis not present

## 2020-03-31 DIAGNOSIS — M797 Fibromyalgia: Secondary | ICD-10-CM | POA: Diagnosis not present

## 2020-03-31 NOTE — Patient Instructions (Signed)
Standing Labs We placed an order today for your standing lab work.   Please have your standing labs drawn in October and every 3 months  If possible, please have your labs drawn 2 weeks prior to your appointment so that the provider can discuss your results at your appointment.  We have open lab daily Monday through Thursday from 8:30-12:30 PM and 1:30-4:30 PM and Friday from 8:30-12:30 PM and 1:30-4:00 PM at the office of Dr. Shaili Deveshwar, [facility].   Please be advised, patients with office appointments requiring lab work will take precedents over walk-in lab work.  If possible, please come for your lab work on Monday and Friday afternoons, as you may experience shorter wait times. The office is located at [address], [address], [place], [place] [address] No appointment is necessary.   Labs are drawn by [facility]. Please bring your co-pay at the time of your lab draw.  You may receive a bill from [facility] for your lab work.  If you wish to have your labs drawn at another location, please call the office 24 hours in advance to send orders.  If you have any questions regarding directions or hours of operation,  please call [phone].   As a reminder, please drink plenty of water prior to coming for your lab work. Thanks!  

## 2020-03-31 NOTE — Progress Notes (Signed)
I called the patient to discuss x-ray results.  All questions were addressed.

## 2020-04-01 LAB — COMPLETE METABOLIC PANEL WITH GFR
AG Ratio: 1.4 (calc) (ref 1.0–2.5)
ALT: 12 U/L (ref 6–29)
AST: 13 U/L (ref 10–35)
Albumin: 4.2 g/dL (ref 3.6–5.1)
Alkaline phosphatase (APISO): 82 U/L (ref 37–153)
BUN: 13 mg/dL (ref 7–25)
CO2: 30 mmol/L (ref 20–32)
Calcium: 9.2 mg/dL (ref 8.6–10.4)
Chloride: 102 mmol/L (ref 98–110)
Creat: 0.72 mg/dL (ref 0.50–1.05)
GFR, Est African American: 112 mL/min/{1.73_m2} (ref >=60)
GFR, Est Non African American: 97 mL/min/{1.73_m2} (ref >=60)
Globulin: 3.1 g/dL (calc) (ref 1.9–3.7)
Glucose, Bld: 78 mg/dL (ref 65–99)
Potassium: 4.5 mmol/L (ref 3.5–5.3)
Sodium: 139 mmol/L (ref 135–146)
Total Bilirubin: 0.3 mg/dL (ref 0.2–1.2)
Total Protein: 7.3 g/dL (ref 6.1–8.1)

## 2020-04-01 LAB — CBC WITH DIFFERENTIAL/PLATELET
Absolute Monocytes: 347 cells/uL (ref 200–950)
Basophils Absolute: 50 cells/uL (ref 0–200)
Basophils Relative: 0.9 %
Eosinophils Absolute: 39 cells/uL (ref 15–500)
Eosinophils Relative: 0.7 %
HCT: 39.4 % (ref 35.0–45.0)
Hemoglobin: 13 g/dL (ref 11.7–15.5)
Lymphs Abs: 2217 cells/uL (ref 850–3900)
MCH: 27.9 pg (ref 27.0–33.0)
MCHC: 33 g/dL (ref 32.0–36.0)
MCV: 84.5 fL (ref 80.0–100.0)
MPV: 11.1 fL (ref 7.5–12.5)
Monocytes Relative: 6.3 %
Neutro Abs: 2849 cells/uL (ref 1500–7800)
Neutrophils Relative %: 51.8 %
Platelets: 255 10*3/uL (ref 140–400)
RBC: 4.66 10*6/uL (ref 3.80–5.10)
RDW: 12.2 % (ref 11.0–15.0)
Total Lymphocyte: 40.3 %
WBC: 5.5 10*3/uL (ref 3.8–10.8)

## 2020-04-01 NOTE — Progress Notes (Signed)
CBC and CMP WNL

## 2020-04-14 ENCOUNTER — Other Ambulatory Visit: Payer: Self-pay

## 2020-04-14 ENCOUNTER — Ambulatory Visit (INDEPENDENT_AMBULATORY_CARE_PROVIDER_SITE_OTHER): Payer: 59

## 2020-04-14 DIAGNOSIS — J4540 Moderate persistent asthma, uncomplicated: Secondary | ICD-10-CM | POA: Diagnosis not present

## 2020-04-28 ENCOUNTER — Ambulatory Visit (INDEPENDENT_AMBULATORY_CARE_PROVIDER_SITE_OTHER): Payer: 59

## 2020-04-28 ENCOUNTER — Other Ambulatory Visit: Payer: Self-pay

## 2020-04-28 DIAGNOSIS — J4540 Moderate persistent asthma, uncomplicated: Secondary | ICD-10-CM

## 2020-05-02 ENCOUNTER — Other Ambulatory Visit: Payer: Self-pay | Admitting: Physician Assistant

## 2020-05-02 NOTE — Telephone Encounter (Signed)
Last Visit: 03/31/2020 Next Visit: 06/30/2020  Okay to refill per Dr. Estanislado Pandy

## 2020-05-11 ENCOUNTER — Telehealth: Payer: Self-pay | Admitting: Allergy & Immunology

## 2020-05-11 MED ORDER — PREDNISONE 10 MG PO TABS: ORAL_TABLET | ORAL | 0 refills | Status: DC

## 2020-05-11 NOTE — Telephone Encounter (Signed)
Patient states that her voice is almost gone, she is very hoarse, but does not feel bad. Patient thinks she needs prednisone to take care of her voice. Patient is taking all medications as directed.  Please advise.

## 2020-05-11 NOTE — Telephone Encounter (Signed)
Called and confirmed pharmacy. Prednisone sent.

## 2020-05-11 NOTE — Telephone Encounter (Signed)
Prednisone pended. Please call to confirm pharmacy.   Salvatore Marvel, MD [facility]

## 2020-05-13 ENCOUNTER — Ambulatory Visit (INDEPENDENT_AMBULATORY_CARE_PROVIDER_SITE_OTHER): Payer: 59 | Admitting: *Deleted

## 2020-05-13 ENCOUNTER — Other Ambulatory Visit: Payer: Self-pay

## 2020-05-13 DIAGNOSIS — J4540 Moderate persistent asthma, uncomplicated: Secondary | ICD-10-CM | POA: Diagnosis not present

## 2020-05-24 ENCOUNTER — Ambulatory Visit: Payer: 59 | Admitting: Allergy & Immunology

## 2020-05-27 ENCOUNTER — Other Ambulatory Visit: Payer: Self-pay

## 2020-05-27 ENCOUNTER — Ambulatory Visit (INDEPENDENT_AMBULATORY_CARE_PROVIDER_SITE_OTHER): Payer: 59

## 2020-05-27 DIAGNOSIS — J4540 Moderate persistent asthma, uncomplicated: Secondary | ICD-10-CM

## 2020-06-10 ENCOUNTER — Ambulatory Visit (INDEPENDENT_AMBULATORY_CARE_PROVIDER_SITE_OTHER): Payer: 59

## 2020-06-10 DIAGNOSIS — J4540 Moderate persistent asthma, uncomplicated: Secondary | ICD-10-CM | POA: Diagnosis not present

## 2020-06-17 NOTE — Progress Notes (Deleted)
Office Visit Note  Patient: Julie [name]             Date of Birth: 06-27-1966           MRN: [id number]             PCP: Suella Broad, FNP Referring: Suella Broad,* Visit Date: 06/30/2020 Occupation: @GUAROCC @  Subjective:  No chief complaint on file.   History of Present Illness: Julie Krisann Mckenna is a 54 y.o. female ***   Activities of Daily Living:  Patient reports morning stiffness for *** {minute/hour:19697}.   Patient {ACTIONS;DENIES/REPORTS:21021675::"Denies"} nocturnal pain.  Difficulty dressing/grooming: {ACTIONS;DENIES/REPORTS:21021675::"Denies"} Difficulty climbing stairs: {ACTIONS;DENIES/REPORTS:21021675::"Denies"} Difficulty getting out of chair: {ACTIONS;DENIES/REPORTS:21021675::"Denies"} Difficulty using hands for taps, buttons, cutlery, and/or writing: {ACTIONS;DENIES/REPORTS:21021675::"Denies"}  No Rheumatology ROS completed.   PMFS History:  Patient Active Problem List   Diagnosis Date Noted  . Anaphylactic shock due to adverse food reaction 02/01/2020  . Moderate persistent asthma without complication 93/57/0177  . Complication of anesthesia   . Fibromyalgia 02/14/2018  . History of gastroesophageal reflux (GERD) 02/14/2018  . History of asthma 02/14/2018  . Rheumatoid arthritis involving multiple sites with positive rheumatoid factor ([place]) 11/05/2016  . High risk medication use 11/05/2016  . LPRD (laryngopharyngeal reflux disease) 09/20/2015  . Seasonal and perennial allergic rhinoconjunctivitis 09/20/2015  . Acute sinusitis 07/29/2015  . S/P total hysterectomy 05/25/2015    Past Medical History:  Diagnosis Date  . Anemia   . Asthma   . Complication of anesthesia   . Eczema   . GERD (gastroesophageal reflux disease)   . PONV (postoperative nausea and vomiting)   . Rheumatoid arthritis ([place])     Family History  Problem Relation Age of Onset  . Asthma Mother   . Eczema Mother   . Sarcoidosis Mother     . Hypertension Mother   . Cancer Father   . Hypotension Father   . Congestive Heart Failure Father   . Colon cancer Father   . Asthma Sister   . GER disease Sister   . Allergic rhinitis Neg Hx   . Angioedema Neg Hx   . Immunodeficiency Neg Hx   . Urticaria Neg Hx   . Esophageal cancer Neg Hx   . Rectal cancer Neg Hx   . Stomach cancer Neg Hx    Past Surgical History:  Procedure Laterality Date  . ABDOMINAL HYSTERECTOMY     partial age 27  . BILATERAL SALPINGECTOMY Bilateral 05/25/2015   Procedure: BILATERAL SALPINGECTOMY;  Surgeon: Servando Salina, MD;  Location: [facility];  Service: Gynecology;  Laterality: Bilateral;  . CARPAL TUNNEL RELEASE Left 01/05/2020   Procedure: LEFT CARPAL TUNNEL RELEASE;  Surgeon: Daryll Brod, MD;  Location: [facility];  Service: Orthopedics;  Laterality: Left;  FOREARM BIER BLOCK  . COLONOSCOPY    . OVARIAN CYST REMOVAL Right 05/25/2015   Procedure: OVARIAN CYSTECTOMY;  Surgeon: Servando Salina, MD;  Location: [facility];  Service: Gynecology;  Laterality: Right;  . TONSILLECTOMY    . TUBAL LIGATION     Social History   Social History Narrative  . Not on file   Immunization History  Administered Date(s) Administered  . Influenza Inj Mdck Quad Pf 10/19/2017  . Influenza, Seasonal, Injecte, Preservative Fre 09/20/2015     Objective: Vital Signs: LMP 05/19/2015    Physical Exam   Musculoskeletal Exam: ***  CDAI Exam: CDAI Score: -- Patient Global: --; Provider Global: -- Swollen: --; Tender: -- Joint Exam 06/30/2020  No joint exam has been documented for this visit   There is currently no information documented on the homunculus. Go to the Rheumatology activity and complete the homunculus joint exam.  Investigation: No additional findings.  Imaging: No results found.  Recent Labs: Lab Results  Component Value Date   WBC 5.5 03/31/2020   HGB 13.0 03/31/2020   PLT 255 03/31/2020   NA 139 03/31/2020   K 4.5  03/31/2020   CL 102 03/31/2020   CO2 30 03/31/2020   GLUCOSE 78 03/31/2020   BUN 13 03/31/2020   CREATININE 0.72 03/31/2020   BILITOT 0.3 03/31/2020   ALKPHOS 55 03/26/2017   AST 13 03/31/2020   ALT 12 03/31/2020   PROT 7.3 03/31/2020   ALBUMIN 3.7 03/26/2017   CALCIUM 9.2 03/31/2020   GFRAA 112 03/31/2020    Speciality Comments: PLQ Eye Exam: 05/05/2019 WNL at [facility] Follow up in 1 year  Procedures:  No procedures performed Allergies: Aspirin, Augmentin [amoxicillin-pot clavulanate], and Peanut-containing drug products   Assessment / Plan:     Visit Diagnoses: No diagnosis found.  Orders: No orders of the defined types were placed in this encounter.  No orders of the defined types were placed in this encounter.   Face-to-face time spent with patient was *** minutes. Greater than 50% of time was spent in counseling and coordination of care.  Follow-Up Instructions: No follow-ups on file.   Earnestine Mealing, CMA  Note - This record has been created using Editor, commissioning.  Chart creation errors have been sought, but may not always  have been located. Such creation errors do not reflect on  the standard of medical care.

## 2020-06-21 MED ORDER — PREDNISONE 5 MG PO TABS
ORAL_TABLET | ORAL | 0 refills | Status: DC
Start: 2020-06-21 — End: 2020-07-13

## 2020-06-21 NOTE — Telephone Encounter (Addendum)
Ok to send in a prednisone taper starting at 20 mg tapering by 5 mg every 4 days.

## 2020-06-24 ENCOUNTER — Ambulatory Visit: Payer: Self-pay

## 2020-06-27 ENCOUNTER — Other Ambulatory Visit: Payer: Self-pay

## 2020-06-30 ENCOUNTER — Ambulatory Visit: Payer: 59 | Admitting: Physician Assistant

## 2020-06-30 NOTE — Progress Notes (Addendum)
Office Visit Note  Patient: Julie [name]             Date of Birth: 1965/10/23           MRN: [id number]             PCP: Suella Broad, FNP Referring: Suella Broad,* Visit Date: 07/13/2020 Occupation: @GUAROCC @  Subjective:  Pain in multiple joints   History of Present Illness: Julie Aylana Hirschfeld is a 54 y.o. female with history of seropositive rheumatoid arthritis and fibromyalgia.  Patient is on methotrexate 1 mL subcutaneous injections once weekly and folic acid 2 mg by mouth daily.  She discontinued Plaquenil in June due to low white blood cell count.  Patient reports that she has had several gaps in therapy with methotrexate due to receiving all 3 COVID-19 vaccinations over the past several months.  Her third dose was administered on 06/23/2020.  She has been experiencing significant myalgias and arthralgias after the vaccine dose.  She restarted her methotrexate on Sunday but has not noticed any improvement in her symptoms.  She is currently having pain in both shoulder joints, both elbow joints, both wrist joints, both hands, and both ankles.  She states that she took a prednisone taper starting on 06/23/2020 which improved her symptoms temporarily.  She states that her fibromyalgia has also been flaring over the past several months due to grief losing several family members as well as worsening insomnia secondary to nocturnal pain.  She has been having difficulty performing ADLs at times due due to severity of pain and fatigue.    Activities of Daily Living:  Patient reports joint stiffness all day   Patient reports nocturnal pain.  Difficulty dressing/grooming: Reports Difficulty climbing stairs: Reports Difficulty getting out of chair: Reports Difficulty using hands for taps, buttons, cutlery, and/or writing: Reports  Review of Systems  Constitutional: Positive for fatigue.  HENT: Negative for mouth sores, mouth dryness and nose dryness.     Eyes: Negative for pain, visual disturbance and dryness.  Respiratory: Negative for cough, hemoptysis, shortness of breath and difficulty breathing.   Cardiovascular: Negative for chest pain, palpitations and hypertension.  Gastrointestinal: Negative for blood in stool, constipation and diarrhea.  Genitourinary: Negative for difficulty urinating and painful urination.  Musculoskeletal: Positive for arthralgias, joint pain, joint swelling, muscle weakness, morning stiffness and muscle tenderness. Negative for myalgias and myalgias.  Skin: Negative for color change, pallor, rash, hair loss, nodules/bumps, skin tightness, ulcers and sensitivity to sunlight.  Allergic/Immunologic: Positive for susceptible to infections.  Neurological: Negative for dizziness, numbness and headaches.  Hematological: Negative for bruising/bleeding tendency and swollen glands.  Psychiatric/Behavioral: Positive for sleep disturbance. Negative for depressed mood. The patient is not nervous/anxious.     PMFS History:  Patient Active Problem List   Diagnosis Date Noted  . Anaphylactic shock due to adverse food reaction 02/01/2020  . Moderate persistent asthma without complication 87/56/4332  . Complication of anesthesia   . Fibromyalgia 02/14/2018  . History of gastroesophageal reflux (GERD) 02/14/2018  . History of asthma 02/14/2018  . Rheumatoid arthritis involving multiple sites with positive rheumatoid factor ([place]) 11/05/2016  . High risk medication use 11/05/2016  . LPRD (laryngopharyngeal reflux disease) 09/20/2015  . Seasonal and perennial allergic rhinoconjunctivitis 09/20/2015  . Acute sinusitis 07/29/2015  . S/P total hysterectomy 05/25/2015    Past Medical History:  Diagnosis Date  . Anemia   . Asthma   . Complication of anesthesia   . Eczema   .  Esophageal dysphagia   . GERD (gastroesophageal reflux disease)   . PONV (postoperative nausea and vomiting)   . Rheumatoid arthritis ([place])      Family History  Problem Relation Age of Onset  . Asthma Mother   . Eczema Mother   . Sarcoidosis Mother   . Hypertension Mother   . Cancer Father   . Hypotension Father   . Congestive Heart Failure Father   . Colon cancer Father   . Asthma Sister   . GER disease Sister   . Allergic rhinitis Neg Hx   . Angioedema Neg Hx   . Immunodeficiency Neg Hx   . Urticaria Neg Hx   . Esophageal cancer Neg Hx   . Rectal cancer Neg Hx   . Stomach cancer Neg Hx    Past Surgical History:  Procedure Laterality Date  . ABDOMINAL HYSTERECTOMY     partial age 20  . BILATERAL SALPINGECTOMY Bilateral 05/25/2015   Procedure: BILATERAL SALPINGECTOMY;  Surgeon: Servando Salina, MD;  Location: [facility];  Service: Gynecology;  Laterality: Bilateral;  . CARPAL TUNNEL RELEASE Left 01/05/2020   Procedure: LEFT CARPAL TUNNEL RELEASE;  Surgeon: Daryll Brod, MD;  Location: [facility];  Service: Orthopedics;  Laterality: Left;  FOREARM BIER BLOCK  . COLONOSCOPY    . OVARIAN CYST REMOVAL Right 05/25/2015   Procedure: OVARIAN CYSTECTOMY;  Surgeon: Servando Salina, MD;  Location: [facility];  Service: Gynecology;  Laterality: Right;  . TONSILLECTOMY    . TUBAL LIGATION     Social History   Social History Narrative  . Not on file   Immunization History  Administered Date(s) Administered  . Influenza Inj Mdck Quad Pf 10/19/2017  . Influenza Split 11/25/2014  . Influenza, Quadrivalent, Recombinant, Inj, Pf 08/26/2019  . Influenza, Seasonal, Injecte, Preservative Fre 09/20/2015  . Influenza,inj,Quad PF,6+ Mos 08/04/2018  . Influenza-Unspecified 07/18/2017  . PFIZER SARS-COV-2 Vaccination 11/28/2019, 12/19/2019, 06/23/2020     Objective: Vital Signs: BP 133/87 (BP Location: Left Arm, Patient Position: Sitting, Cuff Size: Small)   Pulse 77   Resp 12   Ht 5' 1.25" (1.556 m)   Wt 191 lb 3.2 oz (86.7 kg)   LMP 05/19/2015   BMI 35.83 kg/m    Physical Exam Vitals and nursing note reviewed.   Constitutional:      Appearance: She is well-developed.  HENT:     Head: Normocephalic and atraumatic.  Eyes:     Conjunctiva/sclera: Conjunctivae normal.  Pulmonary:     Effort: Pulmonary effort is normal.  Abdominal:     Palpations: Abdomen is soft.  Musculoskeletal:     Cervical back: Normal range of motion.  Skin:    General: Skin is warm and dry.     Capillary Refill: Capillary refill takes less than 2 seconds.  Neurological:     Mental Status: She is alert and oriented to person, place, and time.  Psychiatric:        Behavior: Behavior normal.      Musculoskeletal Exam: Generalized hyperalgesia and positive tender points on exam.  C-spine has limited range of motion with lateral rotation.  She has trapezius muscle tension and muscle tenderness bilaterally.  Shoulder joints have painful range of motion bilaterally.  Shoulder joint abduction to about 90 degrees and painful internal rotation bilaterally.  Elbow joints, wrist joints, MCPs, PIPs, DIPs have good range of motion with no synovitis.  She has tenderness of both elbow joints and both wrist joints on examination.  She is able  to make a complete fist bilaterally.  Knee joints have good range of motion with no warmth or effusion on examination today.  She has tenderness of both ankle joints with swelling in the right ankle.  Tenderness and inflammation in the right third MTP joint noted.  CDAI Exam: CDAI Score: 9.4  Patient Global: 8 mm; Provider Global: 6 mm Swollen: 2 ; Tender: 11  Joint Exam 07/13/2020      Right  Left  Glenohumeral   Tender   Tender  Elbow   Tender   Tender  Wrist   Tender   Tender  Knee   Tender   Tender  Ankle  Swollen Tender   Tender  MTP 3  Swollen Tender        Investigation: No additional findings.  Imaging: No results found.  Recent Labs: Lab Results  Component Value Date   WBC 5.5 03/31/2020   HGB 13.0 03/31/2020   PLT 255 03/31/2020   NA 139 03/31/2020   K 4.5 03/31/2020    CL 102 03/31/2020   CO2 30 03/31/2020   GLUCOSE 78 03/31/2020   BUN 13 03/31/2020   CREATININE 0.72 03/31/2020   BILITOT 0.3 03/31/2020   ALKPHOS 55 03/26/2017   AST 13 03/31/2020   ALT 12 03/31/2020   PROT 7.3 03/31/2020   ALBUMIN 3.7 03/26/2017   CALCIUM 9.2 03/31/2020   GFRAA 112 03/31/2020    Speciality Comments: PLQ Eye Exam: 05/05/2019 WNL at [facility] Follow up in 1 year  Procedures:  No procedures performed Allergies: Aspirin, Augmentin [amoxicillin-pot clavulanate], and Peanut-containing drug products   Assessment / Plan:     Visit Diagnoses: Rheumatoid arthritis involving multiple sites with positive rheumatoid factor ([place]) - +RF, +CCP: She is currently having rheumatoid arthritis and fibromyalgia flare.  She received her third COVID-19 vaccination on 06/23/2020 and has been experiencing increased myalgias and arthralgias since then.  She has tenderness, warmth, and swelling in her right ankle joint and synovitis in her right third MTP joint.  She has tenderness of both shoulders joints, both elbow joints, both wrist joints on examination today.  She has had several gaps of therapy over the past several months due to holding it for the Covid vaccine (11/28/19, 12/19/19, and 06/23/20).  She restarted methotrexate 1 mL subcutaneous injections on Sunday.  She discontinued Plaquenil in June 2021 due to leukopenia.  She was previously doing well on combination therapy and does not want to start on any new medications at this time.  We will recheck her lab work today, and if her lab work is stable she will restart on Plaquenil 200 mg 1 tablet by mouth twice daily Monday through Friday.  She will continue on methotrexate 1 mL subcutaneous injections once weekly and folic acid 2 mg by mouth daily.  She will return for lab work in 1 month then every 3 months to monitor for drug toxicity.  I will send in a prednisone taper starting at 20 mg tapering by 5 mg every week to help with the flow she is  currently experiencing. She was advised to notify us if she continues to have recurrent flares.  She will follow-up in the office in 3 months.  High risk medication use - Methotrexate 1.0 ml sq injections every 7 days and folic acid 2 mg daily.  Plaquenil was discontinued in June 2021 due to low white blood cell count.  Her white blood cell count was 5.5 on 03/31/2020.  Will recheck CBC and  CMP today and if labs are stable she will restart on Plaquenil 200 mg 1 tablet by mouth twice daily Monday through Friday.  She is aware that she will return for lab work every 3 months.  Standing orders for CBC and CMP remain in place.- Plan: CBC with Differential/Platelet, COMPLETE METABOLIC PANEL WITH GFR We will call [facility] to obtain her most recent Plaquenil eye exam.   She has not had any recent infections.  She has received all 3 COVID-19 vaccinations.  She was encouraged to continue to wear a mask and social distance.  She was advised to notify us or PCP if she develops a COVID-19 infection in order to receive the monoclonal antibody infusion.  She voiced understanding. We discussed the importance of holding methotrexate if she develops signs or symptoms of an infection and to resume once the infection has completely cleared.  Fibromyalgia: She is currently having a fibromyalgia flare.  She has generalized hyperalgesia and positive tender points on exam.  She has been experiencing severe myalgias and muscle tenderness as well as worsening fatigue.  Her insomnia has also been worsening secondary to nocturnal pain.  We discussed the importance of regular exercise and good sleep hygiene.  We also discussed the importance of stress management.  Positive ANA (antinuclear antibody) - November 23, 2019: ANA positive (no titer), double-stranded DNA negative, RNP 2.8, Smith negative, SCL 70 -, Ro negative, Lyme negative, chromatin antibody negative: She has no clinical features of systemic lupus at this time.  We will recheck  the following lab work today for monitoring purposes.  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  She has no other clinical features of autoimmune disease at this time.  We will recheck the following lab work for monitoring purposes.- Plan: CBC with Differential/Platelet, COMPLETE METABOLIC PANEL WITH GFR, ANA, Sedimentation rate, C3 and C4, Anti-DNA antibody, double-stranded, RNP Antibody  Trigger middle finger of right hand - Resolved  Left hand paresthesia - NCV with EMG performed on 11/13/2019 by Dr. Ernestina Patches.  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  She has surgical release performed.  Osteopenia of spine - DEXA on 08/24/19: AP spine BMD 0.090 with T-score -1.3.  She will be due to update DEXA in December 2022.  Other medical conditions are listed as follows:  History of depression  History of asthma  Orders: Orders Placed This Encounter  Procedures  . CBC with Differential/Platelet  . COMPLETE METABOLIC PANEL WITH GFR  . ANA  . Sedimentation rate  . C3 and C4  . Anti-DNA antibody, double-stranded  . RNP Antibody   Meds ordered this encounter  Medications  . predniSONE (DELTASONE) 5 MG tablet    Sig: Take 4 tabs po qd x 7 days, 3  tabs po qd x 7 days, 2  tabs po qd x 7 days, 1  tab po qd x 7 days    Dispense:  70 tablet    Refill:  0     Follow-Up Instructions: Return in about 3 months (around 10/13/2020) for Rheumatoid arthritis, Fibromyalgia.   Ofilia Neas, PA-C  Note - This record has been created using Dragon software.  Chart creation errors have been sought, but may not always  have been located. Such creation errors do not reflect on  the standard of medical care.

## 2020-07-07 ENCOUNTER — Other Ambulatory Visit: Payer: Self-pay | Admitting: Rheumatology

## 2020-07-07 NOTE — Telephone Encounter (Signed)
Last Visit: 03/31/2020 Next Visit: 07/13/2020 Labs: 03/31/2020 CBC and CMP WNL.   Current Dose per office note on 03/31/2020: Methotrexate 1.0 ml sq injections every 7 days Dx: Rheumatoid arthritis involving multiple sites with positive rheumatoid factor   Okay to refill per Dr. Estanislado Pandy

## 2020-07-11 ENCOUNTER — Other Ambulatory Visit: Payer: Self-pay

## 2020-07-11 ENCOUNTER — Ambulatory Visit (INDEPENDENT_AMBULATORY_CARE_PROVIDER_SITE_OTHER): Payer: 59 | Admitting: *Deleted

## 2020-07-11 DIAGNOSIS — J4540 Moderate persistent asthma, uncomplicated: Secondary | ICD-10-CM | POA: Diagnosis not present

## 2020-07-13 ENCOUNTER — Ambulatory Visit: Payer: 59 | Admitting: Physician Assistant

## 2020-07-13 ENCOUNTER — Encounter: Payer: Self-pay | Admitting: Physician Assistant

## 2020-07-13 ENCOUNTER — Other Ambulatory Visit: Payer: Self-pay

## 2020-07-13 VITALS — BP 133/87 | HR 77 | Resp 12 | Ht 61.25 in | Wt 191.2 lb

## 2020-07-13 DIAGNOSIS — M8588 Other specified disorders of bone density and structure, other site: Secondary | ICD-10-CM

## 2020-07-13 DIAGNOSIS — Z8659 Personal history of other mental and behavioral disorders: Secondary | ICD-10-CM

## 2020-07-13 DIAGNOSIS — R768 Other specified abnormal immunological findings in serum: Secondary | ICD-10-CM | POA: Diagnosis not present

## 2020-07-13 DIAGNOSIS — Z79899 Other long term (current) drug therapy: Secondary | ICD-10-CM | POA: Diagnosis not present

## 2020-07-13 DIAGNOSIS — M0579 Rheumatoid arthritis with rheumatoid factor of multiple sites without organ or systems involvement: Secondary | ICD-10-CM

## 2020-07-13 DIAGNOSIS — R202 Paresthesia of skin: Secondary | ICD-10-CM

## 2020-07-13 DIAGNOSIS — M65331 Trigger finger, right middle finger: Secondary | ICD-10-CM

## 2020-07-13 DIAGNOSIS — M797 Fibromyalgia: Secondary | ICD-10-CM

## 2020-07-13 DIAGNOSIS — Z8709 Personal history of other diseases of the respiratory system: Secondary | ICD-10-CM

## 2020-07-13 MED ORDER — PREDNISONE 5 MG PO TABS: ORAL_TABLET | ORAL | 0 refills | Status: DC

## 2020-07-13 NOTE — Telephone Encounter (Signed)
Pending lab results, patient will be restarting plaquenil per Hazel Sams, PA-C if WBC count WNL.

## 2020-07-13 NOTE — Patient Instructions (Signed)
COVID-19 vaccine recommendations:   COVID-19 vaccine is recommended for everyone (unless you are allergic to a vaccine component), even if you are on a medication that suppresses your immune system.   If you are on Methotrexate, Cellcept (mycophenolate), Rinvoq, Morrie Sheldon, and Olumiant- hold the medication for 1 week after each vaccine. Hold Methotrexate for 2 weeks after the single dose COVID-19 vaccine.   If you are on Orencia subcutaneous injection - hold medication one week prior to and one week after the first COVID-19 vaccine dose (only).   If you are on Orencia IV infusions- time vaccination administration so that the first COVID-19 vaccination will occur four weeks after the infusion and postpone the subsequent infusion by one week.   If you are on Cyclophosphamide or Rituxan infusions please contact your doctor prior to receiving the COVID-19 vaccine.   Do not take Tylenol or any anti-inflammatory medications (NSAIDs) 24 hours prior to the COVID-19 vaccination.   There is no direct evidence about the efficacy of the COVID-19 vaccine in individuals who are on medications that suppress the immune system.   Even if you are fully vaccinated, and you are on any medications that suppress your immune system, please continue to wear a mask, maintain at least six feet social distance and practice hand hygiene.   If you develop a COVID-19 infection, please contact your PCP or our office to determine if you need monoclonal antibody infusion.  The booster vaccine is now available for immunocompromised patients.   Please see the following web sites for updated information.   [email or web address]    Standing Labs We placed an order today for your standing lab work.   Please have your standing labs drawn in 1 month then every 3 months   If possible, please have your labs drawn 2 weeks prior to your appointment so that the  provider can discuss your results at your appointment.  We have open lab daily Monday through Thursday from 8:30-12:30 PM and 1:30-4:30 PM and Friday from 8:30-12:30 PM and 1:30-4:00 PM at the office of Dr. Bo Merino, [facility].   Please be advised, patients with office appointments requiring lab work will take precedents over walk-in lab work.  If possible, please come for your lab work on Monday and Friday afternoons, as you may experience shorter wait times. The office is located at [address] [place], [place], [place] [address] No appointment is necessary.   Labs are drawn by [facility]. Please bring your co-pay at the time of your lab draw.  You may receive a bill from [facility] for your lab work.  If you wish to have your labs drawn at another location, please call the office 24 hours in advance to send orders.  If you have any questions regarding directions or hours of operation,  please call [phone].   As a reminder, please drink plenty of water prior to coming for your lab work. Thanks!

## 2020-07-14 MED ORDER — HYDROXYCHLOROQUINE SULFATE 200 MG PO TABS
ORAL_TABLET | ORAL | 0 refills | Status: DC
Start: 2020-07-14 — End: 2020-10-13

## 2020-07-14 NOTE — Telephone Encounter (Signed)
CBC and CMP WNL. Please notify the patient that it is ok if she restarts plaquenil 200 mg 1 tablet by mouth twice daily M-F.   Rest of lab work is pending.

## 2020-07-14 NOTE — Progress Notes (Signed)
CBC and CMP WNL.  Please notify the patient that it is ok if she restarts plaquenil 200 mg 1 tablet by mouth twice daily M-F.   Rest of lab work is pending.

## 2020-07-15 LAB — ANTI-NUCLEAR AB-TITER (ANA TITER): ANA Titer 1: 1:160 {titer} — ABNORMAL HIGH

## 2020-07-15 LAB — COMPLETE METABOLIC PANEL WITH GFR
AG Ratio: 1.3 (calc) (ref 1.0–2.5)
ALT: 12 U/L (ref 6–29)
AST: 15 U/L (ref 10–35)
Albumin: 4.1 g/dL (ref 3.6–5.1)
Alkaline phosphatase (APISO): 70 U/L (ref 37–153)
BUN: 8 mg/dL (ref 7–25)
CO2: 27 mmol/L (ref 20–32)
Calcium: 9.3 mg/dL (ref 8.6–10.4)
Chloride: 103 mmol/L (ref 98–110)
Creat: 0.62 mg/dL (ref 0.50–1.05)
GFR, Est African American: 120 mL/min/{1.73_m2} (ref >=60)
GFR, Est Non African American: 104 mL/min/{1.73_m2} (ref >=60)
Globulin: 3.1 g/dL (calc) (ref 1.9–3.7)
Glucose, Bld: 91 mg/dL (ref 65–99)
Potassium: 4.3 mmol/L (ref 3.5–5.3)
Sodium: 138 mmol/L (ref 135–146)
Total Bilirubin: 0.5 mg/dL (ref 0.2–1.2)
Total Protein: 7.2 g/dL (ref 6.1–8.1)

## 2020-07-15 LAB — CBC WITH DIFFERENTIAL/PLATELET
Absolute Monocytes: 351 cells/uL (ref 200–950)
Basophils Absolute: 50 cells/uL (ref 0–200)
Basophils Relative: 1.1 %
Eosinophils Absolute: 131 cells/uL (ref 15–500)
Eosinophils Relative: 2.9 %
HCT: 39.5 % (ref 35.0–45.0)
Hemoglobin: 13.1 g/dL (ref 11.7–15.5)
Lymphs Abs: 1683 cells/uL (ref 850–3900)
MCH: 28.1 pg (ref 27.0–33.0)
MCHC: 33.2 g/dL (ref 32.0–36.0)
MCV: 84.6 fL (ref 80.0–100.0)
MPV: 11.4 fL (ref 7.5–12.5)
Monocytes Relative: 7.8 %
Neutro Abs: 2286 cells/uL (ref 1500–7800)
Neutrophils Relative %: 50.8 %
Platelets: 252 10*3/uL (ref 140–400)
RBC: 4.67 10*6/uL (ref 3.80–5.10)
RDW: 12 % (ref 11.0–15.0)
Total Lymphocyte: 37.4 %
WBC: 4.5 10*3/uL (ref 3.8–10.8)

## 2020-07-15 LAB — C3 AND C4
C3 Complement: 194 mg/dL — ABNORMAL HIGH (ref 83–193)
C4 Complement: 46 mg/dL (ref 15–57)

## 2020-07-15 LAB — ANA: Anti Nuclear Antibody (ANA): POSITIVE — AB

## 2020-07-15 LAB — RNP ANTIBODY: Ribonucleic Protein(ENA) Antibody, IgG: 7.5 AI — AB

## 2020-07-15 LAB — SEDIMENTATION RATE: Sed Rate: 31 mm/h — ABNORMAL HIGH (ref 0–30)

## 2020-07-15 LAB — ANTI-DNA ANTIBODY, DOUBLE-STRANDED: ds DNA Ab: <1 IU/mL

## 2020-07-18 NOTE — Progress Notes (Signed)
ANA is positive-low, nonspecific titer.  RNP remains positive.  ESR is mildly elevated. CBC and CMP WNL. C3 borderline elevated and C4 WNL.  DsDNA is negative.   No additional changes in therapy recommended at this time.

## 2020-07-25 ENCOUNTER — Ambulatory Visit: Payer: Self-pay

## 2020-07-27 ENCOUNTER — Ambulatory Visit (INDEPENDENT_AMBULATORY_CARE_PROVIDER_SITE_OTHER): Payer: 59 | Admitting: *Deleted

## 2020-07-27 DIAGNOSIS — J4540 Moderate persistent asthma, uncomplicated: Secondary | ICD-10-CM

## 2020-07-30 ENCOUNTER — Other Ambulatory Visit: Payer: Self-pay | Admitting: Rheumatology

## 2020-08-01 ENCOUNTER — Other Ambulatory Visit: Payer: Self-pay | Admitting: Rheumatology

## 2020-08-01 NOTE — Telephone Encounter (Signed)
Last Visit: 07/13/2020 Next Visit: 10/14/2020 Labs: 07/13/2020 CBC and CMP WNL  Current Dose per office note 07/13/2020: Methotrexate 1.0 ml sq injections every 7 days  DX:  Rheumatoid arthritis involving multiple sites with positive rheumatoid factor   Okay to refill per Dr. Estanislado Pandy

## 2020-08-02 ENCOUNTER — Other Ambulatory Visit: Payer: Self-pay | Admitting: *Deleted

## 2020-08-02 DIAGNOSIS — R768 Other specified abnormal immunological findings in serum: Secondary | ICD-10-CM

## 2020-08-02 DIAGNOSIS — Z79899 Other long term (current) drug therapy: Secondary | ICD-10-CM

## 2020-08-03 ENCOUNTER — Other Ambulatory Visit: Payer: Self-pay | Admitting: Physician Assistant

## 2020-08-10 ENCOUNTER — Other Ambulatory Visit: Payer: Self-pay

## 2020-08-10 ENCOUNTER — Ambulatory Visit (INDEPENDENT_AMBULATORY_CARE_PROVIDER_SITE_OTHER): Payer: 59 | Admitting: *Deleted

## 2020-08-10 DIAGNOSIS — J4540 Moderate persistent asthma, uncomplicated: Secondary | ICD-10-CM

## 2020-08-24 ENCOUNTER — Ambulatory Visit (INDEPENDENT_AMBULATORY_CARE_PROVIDER_SITE_OTHER): Payer: 59 | Admitting: *Deleted

## 2020-08-24 ENCOUNTER — Other Ambulatory Visit: Payer: Self-pay

## 2020-08-24 DIAGNOSIS — J4540 Moderate persistent asthma, uncomplicated: Secondary | ICD-10-CM | POA: Diagnosis not present

## 2020-09-07 ENCOUNTER — Ambulatory Visit: Payer: Self-pay

## 2020-09-14 ENCOUNTER — Ambulatory Visit (INDEPENDENT_AMBULATORY_CARE_PROVIDER_SITE_OTHER): Payer: 59

## 2020-09-14 DIAGNOSIS — J4540 Moderate persistent asthma, uncomplicated: Secondary | ICD-10-CM

## 2020-09-28 ENCOUNTER — Ambulatory Visit: Payer: Self-pay

## 2020-09-30 ENCOUNTER — Other Ambulatory Visit: Payer: Self-pay | Admitting: Allergy & Immunology

## 2020-09-30 NOTE — Progress Notes (Deleted)
Office Visit Note  Patient: Julie [name]             Date of Birth: 1966/05/18           MRN: [id number]             PCP: Suella Broad, FNP Referring: Suella Broad,* Visit Date: 10/14/2020 Occupation: @GUAROCC @  Subjective:  No chief complaint on file.   History of Present Illness: Julie [name] is a 55 y.o. female ***   Activities of Daily Living:  Patient reports morning stiffness for *** {minute/hour:19697}.   Patient {ACTIONS;DENIES/REPORTS:21021675::"Denies"} nocturnal pain.  Difficulty dressing/grooming: {ACTIONS;DENIES/REPORTS:21021675::"Denies"} Difficulty climbing stairs: {ACTIONS;DENIES/REPORTS:21021675::"Denies"} Difficulty getting out of chair: {ACTIONS;DENIES/REPORTS:21021675::"Denies"} Difficulty using hands for taps, buttons, cutlery, and/or writing: {ACTIONS;DENIES/REPORTS:21021675::"Denies"}  No Rheumatology ROS completed.   PMFS History:  Patient Active Problem List   Diagnosis Date Noted  . Anaphylactic shock due to adverse food reaction 02/01/2020  . Moderate persistent asthma without complication 46/96/2952  . Complication of anesthesia   . Fibromyalgia 02/14/2018  . History of gastroesophageal reflux (GERD) 02/14/2018  . History of asthma 02/14/2018  . Rheumatoid arthritis involving multiple sites with positive rheumatoid factor ([place]) 11/05/2016  . High risk medication use 11/05/2016  . LPRD (laryngopharyngeal reflux disease) 09/20/2015  . Seasonal and perennial allergic rhinoconjunctivitis 09/20/2015  . Acute sinusitis 07/29/2015  . S/P total hysterectomy 05/25/2015    Past Medical History:  Diagnosis Date  . Anemia   . Asthma   . Complication of anesthesia   . Eczema   . Esophageal dysphagia   . GERD (gastroesophageal reflux disease)   . PONV (postoperative nausea and vomiting)   . Rheumatoid arthritis ([place])     Family History  Problem Relation Age of Onset  . Asthma Mother   . Eczema Mother    . Sarcoidosis Mother   . Hypertension Mother   . Cancer Father   . Hypotension Father   . Congestive Heart Failure Father   . Colon cancer Father   . Asthma Sister   . GER disease Sister   . Allergic rhinitis Neg Hx   . Angioedema Neg Hx   . Immunodeficiency Neg Hx   . Urticaria Neg Hx   . Esophageal cancer Neg Hx   . Rectal cancer Neg Hx   . Stomach cancer Neg Hx    Past Surgical History:  Procedure Laterality Date  . ABDOMINAL HYSTERECTOMY     partial age 69  . BILATERAL SALPINGECTOMY Bilateral 05/25/2015   Procedure: BILATERAL SALPINGECTOMY;  Surgeon: Servando Salina, MD;  Location: [facility];  Service: Gynecology;  Laterality: Bilateral;  . CARPAL TUNNEL RELEASE Left 01/05/2020   Procedure: LEFT CARPAL TUNNEL RELEASE;  Surgeon: Daryll Brod, MD;  Location: [facility];  Service: Orthopedics;  Laterality: Left;  FOREARM BIER BLOCK  . COLONOSCOPY    . OVARIAN CYST REMOVAL Right 05/25/2015   Procedure: OVARIAN CYSTECTOMY;  Surgeon: Servando Salina, MD;  Location: [facility];  Service: Gynecology;  Laterality: Right;  . TONSILLECTOMY    . TUBAL LIGATION     Social History   Social History Narrative  . Not on file   Immunization History  Administered Date(s) Administered  . Influenza Inj Mdck Quad Pf 10/19/2017  . Influenza Split 11/25/2014  . Influenza, Quadrivalent, Recombinant, Inj, Pf 08/26/2019  . Influenza, Seasonal, Injecte, Preservative Fre 09/20/2015  . Influenza,inj,Quad PF,6+ Mos 08/04/2018  . Influenza-Unspecified 07/18/2017  . PFIZER SARS-COV-2 Vaccination 11/28/2019, 12/19/2019, 06/23/2020     Objective:  Vital Signs: LMP 05/19/2015    Physical Exam   Musculoskeletal Exam: ***  CDAI Exam: CDAI Score: -- Patient Global: --; Provider Global: -- Swollen: --; Tender: -- Joint Exam 10/14/2020   No joint exam has been documented for this visit   There is currently no information documented on the homunculus. Go to the Rheumatology activity  and complete the homunculus joint exam.  Investigation: No additional findings.  Imaging: No results found.  Recent Labs: Lab Results  Component Value Date   WBC 4.5 07/13/2020   HGB 13.1 07/13/2020   PLT 252 07/13/2020   NA 138 07/13/2020   K 4.3 07/13/2020   CL 103 07/13/2020   CO2 27 07/13/2020   GLUCOSE 91 07/13/2020   BUN 8 07/13/2020   CREATININE 0.62 07/13/2020   BILITOT 0.5 07/13/2020   ALKPHOS 55 03/26/2017   AST 15 07/13/2020   ALT 12 07/13/2020   PROT 7.2 07/13/2020   ALBUMIN 3.7 03/26/2017   CALCIUM 9.3 07/13/2020   GFRAA 120 07/13/2020    Speciality Comments: PLQ Eye Exam: 05/05/2019 WNL at [facility] Follow up in 1 year  Procedures:  No procedures performed Allergies: Aspirin, Augmentin [amoxicillin-pot clavulanate], and Peanut-containing drug products   Assessment / Plan:     Visit Diagnoses: No diagnosis found.  Orders: No orders of the defined types were placed in this encounter.  No orders of the defined types were placed in this encounter.   Face-to-face time spent with patient was *** minutes. Greater than 50% of time was spent in counseling and coordination of care.  Follow-Up Instructions: No follow-ups on file.   Earnestine Mealing, CMA  Note - This record has been created using Editor, commissioning.  Chart creation errors have been sought, but may not always  have been located. Such creation errors do not reflect on  the standard of medical care.

## 2020-10-05 ENCOUNTER — Ambulatory Visit (INDEPENDENT_AMBULATORY_CARE_PROVIDER_SITE_OTHER): Payer: 59

## 2020-10-05 DIAGNOSIS — J4540 Moderate persistent asthma, uncomplicated: Secondary | ICD-10-CM | POA: Diagnosis not present

## 2020-10-12 NOTE — Progress Notes (Signed)
Office Visit Note  Patient: Julie [name]             Date of Birth: Jan 22, 1966           MRN: [id number]             PCP: Suella Broad, FNP Referring: Suella Broad,* Visit Date: 10/13/2020 Occupation: @GUAROCC @  Subjective:  Medication management.   History of Present Illness: Julie [name] is a 55 y.o. female with rheumatoid arthritis and fibromyalgia syndrome.  She states she has been doing fairly well.  She occasionally have discomfort in her hands and her feet.  She states her right ankle joint swells off and on.  She also describes some discomfort in the trapezius region.  She had a flare of fibromyalgia few months back but currently doing fine.  She been tolerating her medications well.  Activities of Daily Living:  Patient reports morning stiffness for 0 minutes.   Patient Denies nocturnal pain.  Difficulty dressing/grooming: Denies Difficulty climbing stairs: Reports Difficulty getting out of chair: Denies Difficulty using hands for taps, buttons, cutlery, and/or writing: Reports  Review of Systems  Constitutional: Negative for fatigue, night sweats, weight gain and weight loss.  HENT: Negative for mouth sores, trouble swallowing, trouble swallowing, mouth dryness and nose dryness.   Eyes: Positive for itching and visual disturbance. Negative for pain, redness and dryness.  Respiratory: Negative for cough, hemoptysis, shortness of breath and difficulty breathing.   Cardiovascular: Negative for chest pain, palpitations, hypertension, irregular heartbeat and swelling in legs/feet.  Gastrointestinal: Positive for constipation. Negative for abdominal pain, blood in stool and diarrhea.  Endocrine: Negative for increased urination.  Genitourinary: Negative for painful urination and vaginal dryness.  Musculoskeletal: Positive for arthralgias, joint pain, joint swelling and muscle tenderness. Negative for myalgias, muscle weakness,  morning stiffness and myalgias.  Skin: Negative for color change, rash, hair loss, redness, skin tightness, ulcers and sensitivity to sunlight.  Allergic/Immunologic: Negative for susceptible to infections.  Neurological: Negative for dizziness, numbness, headaches, memory loss, night sweats and weakness.  Hematological: Negative for swollen glands.  Psychiatric/Behavioral: Negative for depressed mood, confusion and sleep disturbance. The patient is not nervous/anxious.     PMFS History:  Patient Active Problem List   Diagnosis Date Noted  . Anaphylactic shock due to adverse food reaction 02/01/2020  . Moderate persistent asthma without complication 41/63/8453  . Complication of anesthesia   . Fibromyalgia 02/14/2018  . History of gastroesophageal reflux (GERD) 02/14/2018  . History of asthma 02/14/2018  . Rheumatoid arthritis involving multiple sites with positive rheumatoid factor ([place]) 11/05/2016  . High risk medication use 11/05/2016  . LPRD (laryngopharyngeal reflux disease) 09/20/2015  . Seasonal and perennial allergic rhinoconjunctivitis 09/20/2015  . Acute sinusitis 07/29/2015  . S/P total hysterectomy 05/25/2015    Past Medical History:  Diagnosis Date  . Anemia   . Asthma   . Complication of anesthesia   . Eczema   . Esophageal dysphagia   . GERD (gastroesophageal reflux disease)   . PONV (postoperative nausea and vomiting)   . Rheumatoid arthritis ([place])     Family History  Problem Relation Age of Onset  . Asthma Mother   . Eczema Mother   . Sarcoidosis Mother   . Hypertension Mother   . Cancer Father   . Hypotension Father   . Congestive Heart Failure Father   . Colon cancer Father   . Asthma Sister   . GER disease Sister   . Allergic  rhinitis Neg Hx   . Angioedema Neg Hx   . Immunodeficiency Neg Hx   . Urticaria Neg Hx   . Esophageal cancer Neg Hx   . Rectal cancer Neg Hx   . Stomach cancer Neg Hx    Past Surgical History:  Procedure Laterality Date   . ABDOMINAL HYSTERECTOMY     partial age 60  . BILATERAL SALPINGECTOMY Bilateral 05/25/2015   Procedure: BILATERAL SALPINGECTOMY;  Surgeon: Servando Salina, MD;  Location: [facility];  Service: Gynecology;  Laterality: Bilateral;  . CARPAL TUNNEL RELEASE Left 01/05/2020   Procedure: LEFT CARPAL TUNNEL RELEASE;  Surgeon: Daryll Brod, MD;  Location: [facility];  Service: Orthopedics;  Laterality: Left;  FOREARM BIER BLOCK  . COLONOSCOPY    . OVARIAN CYST REMOVAL Right 05/25/2015   Procedure: OVARIAN CYSTECTOMY;  Surgeon: Servando Salina, MD;  Location: [facility];  Service: Gynecology;  Laterality: Right;  . TONSILLECTOMY    . TUBAL LIGATION     Social History   Social History Narrative  . Not on file   Immunization History  Administered Date(s) Administered  . Influenza Inj Mdck Quad Pf 10/19/2017  . Influenza Split 11/25/2014  . Influenza, Quadrivalent, Recombinant, Inj, Pf 08/26/2019  . Influenza, Seasonal, Injecte, Preservative Fre 09/20/2015  . Influenza,inj,Quad PF,6+ Mos 08/04/2018  . Influenza-Unspecified 07/18/2017  . PFIZER(Purple Top)SARS-COV-2 Vaccination 11/28/2019, 12/19/2019, 06/23/2020     Objective: Vital Signs: BP (!) 74/53 (BP Location: Left Arm, Patient Position: Sitting, Cuff Size: Normal)   Pulse (!) 137   Ht 5' 1.25" (1.556 m)   Wt 195 lb (88.5 kg)   LMP 05/19/2015   BMI 36.54 kg/m    Physical Exam Vitals and nursing note reviewed.  Constitutional:      Appearance: She is well-developed and well-nourished.  HENT:     Head: Normocephalic and atraumatic.  Eyes:     Extraocular Movements: EOM normal.     Conjunctiva/sclera: Conjunctivae normal.  Cardiovascular:     Rate and Rhythm: Normal rate and regular rhythm.     Pulses: Intact distal pulses.     Heart sounds: Normal heart sounds.  Pulmonary:     Effort: Pulmonary effort is normal.     Breath sounds: Normal breath sounds.  Abdominal:     General: Bowel sounds are normal.      Palpations: Abdomen is soft.  Musculoskeletal:     Cervical back: Normal range of motion.  Lymphadenopathy:     Cervical: No cervical adenopathy.  Skin:    General: Skin is warm and dry.     Capillary Refill: Capillary refill takes less than 2 seconds.  Neurological:     Mental Status: She is alert and oriented to person, place, and time.  Psychiatric:        Mood and Affect: Mood and affect normal.        Behavior: Behavior normal.      Musculoskeletal Exam: C-spine was in good range of motion.  She has some discomfort range of motion of her left shoulder joint.  Elbow joints, wrist joints, MCPs and PIPs with good range of motion with no synovitis.  Hip joints and knee joints in good range of motion.  She has some swelling over her right ankle joint.  And tenderness across MTPs as described below.  CDAI Exam: CDAI Score: 1.8  Patient Global: 4 mm; Provider Global: 4 mm Swollen: 1 ; Tender: 8  Joint Exam 10/13/2020      Right  Left  Glenohumeral      Tender  Subtalar  Swollen Tender     MTP 1      Tender  MTP 2   Tender   Tender  MTP 3   Tender   Tender  MTP 4   Tender        Investigation: No additional findings.  Imaging: No results found.  Recent Labs: Lab Results  Component Value Date   WBC 4.5 07/13/2020   HGB 13.1 07/13/2020   PLT 252 07/13/2020   NA 138 07/13/2020   K 4.3 07/13/2020   CL 103 07/13/2020   CO2 27 07/13/2020   GLUCOSE 91 07/13/2020   BUN 8 07/13/2020   CREATININE 0.62 07/13/2020   BILITOT 0.5 07/13/2020   ALKPHOS 55 03/26/2017   AST 15 07/13/2020   ALT 12 07/13/2020   PROT 7.2 07/13/2020   ALBUMIN 3.7 03/26/2017   CALCIUM 9.3 07/13/2020   GFRAA 120 07/13/2020    Speciality Comments: PLQ Eye Exam: 05/05/2019 WNL at [facility] Follow up in 1 year  Procedures:  No procedures performed Allergies: Aspirin, Augmentin [amoxicillin-pot clavulanate], and Peanut-containing drug products   Assessment / Plan:     Visit Diagnoses:  Rheumatoid arthritis involving multiple sites with positive rheumatoid factor (HCC) -  +RF, +CCP.  Patient has history of erosive rheumatoid arthritis.  She has intermittent flares.  She states she is doing okay with combination of methotrexate and Plaquenil.  She continues to have pain and swelling in her right ankle joint.  She also has discomfort in her hands and feet in her left shoulder.  I had detailed discussion regarding different treatment options their side effects.  She has positive ANA and positive RNP although she does not have any clinical features of autoimmune disease at this time.  I will try to avoid anti-TNF's.  I discussed the indications side effects contraindications of Orencia.  Handout was given and consent was taken.  We will apply for Orencia subcutaneous.  Once labs are available we can start her on Orencia.  Medication counseling:  TB Gold: Pending Hepatitis panel: Pending HIV: Pending SPEP: Pending Immunoglobulins: October 02, 2016  Does patient have a diagnosis of COPD? No  Counseled patient that Maureen Chatters is a selective T-cell costimulation blocker indicated for rheumatoid arthritis.  Counseled patient on purpose, proper use, and adverse effects of Orencia. The most common adverse effects are increased risk of infections, headache, and infusion reactions.  There is the possibility of an increased risk of malignancy but it is not well understood if this increased risk is due to the medication or the disease state.  Reviewed the importance of regular labs while on Orencia therapy.  Counseled patient that Maureen Chatters should be held prior to scheduled surgery.  Counseled patient to avoid live vaccines while on Orencia.  Advised patient to get annual influenza vaccine and the pneumococcal vaccine as indicated.  Provided patient with medication education material and answered all questions.  Patient consented to [facility].  Will upload consent into patient's chart.  Will submit benefit's  investigation for Orencia.   High risk medication use - Methotrexate 1.0 ml sq injections every 7 days and folic acid 2 mg daily.  Plaquenil 240m po BID M-F..Marland Kitchen  We will get labs today.- Plan: CBC with Differential/Platelet, COMPLETE METABOLIC PANEL WITH GFR, QuantiFERON-TB Gold Plus, Hepatitis B core antibody, IgM, Hepatitis B surface antigen, Hepatitis C antibody, HIV Antibody (routine testing w rflx), QuantiFERON-TB Gold Plus, Serum protein electrophoresis with reflex.  Once started on Orencia she will need labs in a month and then every 3 months to monitor for drug toxicity.  We may discontinue Plaquenil once she starts Orencia.  Fibromyalgia-she continues to have some generalized pain and discomfort.  Positive ANA (antinuclear antibody) - 07/13/20: ANA is positive-low, nonspecific titer.RNP remains positive.  ESR is mildly elevated. CBC and CMP WNL. C3 borderline elevated and C4 WNL. DsDNA is neg.  There is no history of oral ulcers, nasal ulcers, malar rash, photosensitivity or Raynaud's phenomenon.  She has no sclerodactyly or nailbed capillary changes.  Anti-RNP antibodies present - 07/13/2020 RNP was 7.5  Osteopenia of spine - DEXA on 08/24/19: AP spine BMD 0.090 with T-score -1.3.  She will be due to update DEXA in December 2022.  History of asthma-not currently active.  History of depression  Educated about COVID-19 virus infection-she is fully vaccinated against COVID-19 also had the third vaccine.  I advised a booster 6 months after the 6 vaccine.  Instructions regarding holding methotrexate and Orencia around her booster were placed in the AVS and discussed.  Use of mask, social distancing and hand hygiene was discussed.  Orders: Orders Placed This Encounter  Procedures  . CBC with Differential/Platelet  . COMPLETE METABOLIC PANEL WITH GFR  . QuantiFERON-TB Gold Plus  . Hepatitis B core antibody, IgM  . Hepatitis B surface antigen  . Hepatitis C antibody  . HIV Antibody  (routine testing w rflx)  . QuantiFERON-TB Gold Plus  . Serum protein electrophoresis with reflex   Meds ordered this encounter  Medications  . B-D TB SYRINGE 1CC/27GX1/2" 27G X 1/2" 1 ML MISC    Sig: Use to inject Methotrexate weekly.    Dispense:  12 each    Refill:  3  . hydroxychloroquine (PLAQUENIL) 200 MG tablet    Sig: Take 1 tablet 200 mg BID Monday-Friday    Dispense:  120 tablet    Refill:  0  . methotrexate 50 MG/2ML injection    Sig: 1.0 mL subcutaneously every 7 days    Dispense:  12 mL    Refill:  0  . folic acid (FOLVITE) 1 MG tablet    Sig: Take 1 tablet (1 mg total) by mouth 2 (two) times daily.    Dispense:  60 tablet    Refill:  14    PATIENT NEEDS REFILL     Follow-Up Instructions: Return in about 2 months (around 12/11/2020) for Rheumatoid arthritis.   Bo Merino, MD  Note - This record has been created using Editor, commissioning.  Chart creation errors have been sought, but may not always  have been located. Such creation errors do not reflect on  the standard of medical care.

## 2020-10-13 ENCOUNTER — Other Ambulatory Visit: Payer: Self-pay

## 2020-10-13 ENCOUNTER — Other Ambulatory Visit: Payer: Self-pay | Admitting: Rheumatology

## 2020-10-13 ENCOUNTER — Telehealth: Payer: Self-pay | Admitting: Pharmacist

## 2020-10-13 ENCOUNTER — Ambulatory Visit: Payer: 59 | Admitting: Rheumatology

## 2020-10-13 ENCOUNTER — Encounter: Payer: Self-pay | Admitting: Rheumatology

## 2020-10-13 VITALS — BP 74/53 | HR 137 | Ht 61.25 in | Wt 195.0 lb

## 2020-10-13 DIAGNOSIS — Z7189 Other specified counseling: Secondary | ICD-10-CM

## 2020-10-13 DIAGNOSIS — Z8709 Personal history of other diseases of the respiratory system: Secondary | ICD-10-CM

## 2020-10-13 DIAGNOSIS — M797 Fibromyalgia: Secondary | ICD-10-CM | POA: Diagnosis not present

## 2020-10-13 DIAGNOSIS — M0579 Rheumatoid arthritis with rheumatoid factor of multiple sites without organ or systems involvement: Secondary | ICD-10-CM

## 2020-10-13 DIAGNOSIS — R768 Other specified abnormal immunological findings in serum: Secondary | ICD-10-CM | POA: Diagnosis not present

## 2020-10-13 DIAGNOSIS — R202 Paresthesia of skin: Secondary | ICD-10-CM

## 2020-10-13 DIAGNOSIS — Z8659 Personal history of other mental and behavioral disorders: Secondary | ICD-10-CM

## 2020-10-13 DIAGNOSIS — M8588 Other specified disorders of bone density and structure, other site: Secondary | ICD-10-CM

## 2020-10-13 DIAGNOSIS — M65331 Trigger finger, right middle finger: Secondary | ICD-10-CM

## 2020-10-13 DIAGNOSIS — Z79899 Other long term (current) drug therapy: Secondary | ICD-10-CM

## 2020-10-13 MED ORDER — HYDROXYCHLOROQUINE SULFATE 200 MG PO TABS: ORAL_TABLET | ORAL | 0 refills | Status: DC

## 2020-10-13 MED ORDER — METHOTREXATE SODIUM CHEMO INJECTION 50 MG/2ML: INTRAMUSCULAR | 0 refills | Status: DC

## 2020-10-13 MED ORDER — FOLIC ACID 1 MG PO TABS: 1.0000 mg | ORAL_TABLET | Freq: Two times a day (BID) | ORAL | 14 refills | Status: DC

## 2020-10-13 MED ORDER — "BD TB SYRINGE 27G X 1/2"" 1 ML MISC": 3 refills | Status: DC

## 2020-10-13 NOTE — Progress Notes (Signed)
Pharmacy Note  Subjective: Patient presents today to [facility] for follow up office visit with Dr. Estanislado Pandy.   Patient seen by the pharmacist for counseling on subcutaneous Orencia rheumatoid arthritis with positive ANA.  Prior therapy includes: injectable MTX (vial and syringe), hydroxychloroquine.   Objective: CBC    Component Value Date/Time   WBC 4.5 07/13/2020 1351   RBC 4.67 07/13/2020 1351   HGB 13.1 07/13/2020 1351   HGB 13.4 10/02/2016 1602   HCT 39.5 07/13/2020 1351   HCT 40.0 10/02/2016 1602   PLT 252 07/13/2020 1351   PLT 266 10/02/2016 1602   MCV 84.6 07/13/2020 1351   MCV 85 10/02/2016 1602   MCH 28.1 07/13/2020 1351   MCHC 33.2 07/13/2020 1351   RDW 12.0 07/13/2020 1351   RDW 12.5 10/02/2016 1602   LYMPHSABS 1,683 07/13/2020 1351   LYMPHSABS 1.5 10/02/2016 1602   MONOABS 222 03/26/2017 1244   EOSABS 131 07/13/2020 1351   EOSABS 0.1 10/02/2016 1602   BASOSABS 50 07/13/2020 1351   BASOSABS 0.0 10/02/2016 1602    CMP     Component Value Date/Time   NA 138 07/13/2020 1351   NA 140 10/02/2016 1602   K 4.3 07/13/2020 1351   CL 103 07/13/2020 1351   CO2 27 07/13/2020 1351   GLUCOSE 91 07/13/2020 1351   BUN 8 07/13/2020 1351   BUN 8 10/02/2016 1602   CREATININE 0.62 07/13/2020 1351   CALCIUM 9.3 07/13/2020 1351   PROT 7.2 07/13/2020 1351   PROT 7.1 10/02/2016 1602   ALBUMIN 3.7 03/26/2017 1244   ALBUMIN 4.1 10/02/2016 1602   AST 15 07/13/2020 1351   ALT 12 07/13/2020 1351   ALKPHOS 55 03/26/2017 1244   BILITOT 0.5 07/13/2020 1351   BILITOT 0.4 10/02/2016 1602   GFRNONAA 104 07/13/2020 1351   GFRAA 120 07/13/2020 1351    Baseline Immunosuppressant Therapy Labs TB GOLD   Hepatitis Panel   HIV No results found for: HIV Immunoglobulins Immunoglobulin Electrophoresis Latest Ref Rng & Units 10/02/2016  IgG 700 - 1,600 mg/dL 1,445  IgM 26 - 217 mg/dL 90   SPEP Serum Protein Electrophoresis Latest Ref Rng & Units 07/13/2020  Total  Protein 6.1 - 8.1 g/dL 7.2   G6PD No results found for: G6PDH TPMT No results found for: TPMT   Chest x-ray: 10/08/2018 wnl  Does patient have a diagnosis of COPD? No. She has history of asthma but no recent exacerbations.  Does patient have history of diverticulitis?  No  Assessment/Plan:  Counseled patient that Maureen Chatters is a selective T-cell costimulation blocker.  Counseled patient on purpose, proper use, and adverse effects of Orencia. The most common adverse effects are increased risk of infections, headache, and injection site reactions.  Discussed injection site reaction management and when to notify clinic. There is the possibility of an increased risk of malignancy but it is not well understood if this increased risk is due to the medication or the disease state. Reviewed risk of GI perforation which is higher in patients with diverticulitis and diabetes.  Counseled patient that Maureen Chatters should be held prior to scheduled surgery.  Counseled patient to avoid live vaccines while on Orencia.  Recommend annual influenza, Pneumovax 23, Prevnar 13, and Shingrix as indicated.   Reviewed the importance of regular labs while on Orencia therapy. Patient will be due for labs 1 month after starting therapy. Standing orders placed. Provided patient with medication education material and answered all questions.  Patient consented to [facility].  Will upload consent into patient's chart.  Will apply for Orencia through patient's insurance.  Reviewed storage information for Orencia.  Advised initial injection must be administered in office.    Patient dose will be Orencia 125 mg every 7 days.  Prescription pending lab results and/or insurance approval. Orencia BIV started in separate encounter. Plan is to continue injectable MTX but stop hydroxychloroquine.  Knox Saliva, PharmD, MPH Clinical Pharmacist (Rheumatology and Pulmonology)

## 2020-10-13 NOTE — Telephone Encounter (Signed)
Please start Orencia Clickject to administered SQ BIV.  Dx: rheumatoid arthritis, positive ANA  Patient has commercial plan so would qualify for copay card

## 2020-10-13 NOTE — Patient Instructions (Signed)
Abatacept solution for injection (subcutaneous or intravenous use) What is this medicine? ABATACEPT (a ba TA sept) is used to treat moderate to severe active rheumatoid arthritis or psoriatic arthritis in adults. This medicine is also used to treat juvenile idiopathic arthritis. This medicine may be used for other purposes; ask your health care provider or pharmacist if you have questions. COMMON BRAND NAME(S): Orencia What should I tell my health care provider before I take this medicine? They need to know if you have any of these conditions:  cancer  diabetes  hepatitis B or history of hepatitis B infection  immune system problems  infection or history of infection (especially a virus infection such as chickenpox, cold sores, or herpes)  lung or breathing problems, like chronic obstructive pulmonary disease (COPD)  recently received or scheduled to receive a vaccination  scheduled to have surgery  tuberculosis, a positive skin test for tuberculosis, or have recently been in close contact with someone who has tuberculosis  an unusual or allergic reaction to abatacept, other medicines, foods, dyes, or preservatives  pregnant or trying to get pregnant  breast-feeding How should I use this medicine? This medicine is for infusion into a vein or for injection under the skin. Infusions are given by a health care professional in a hospital or clinic setting. If you are to give your own medicine at home, you will be taught how to prepare and give this medicine under the skin. Use exactly as directed. Take your medicine at regular intervals. Do not take your medicine more often than directed. It is important that you put your used needles and syringes in a special sharps container. Do not put them in a trash can. If you do not have a sharps container, call your pharmacist or health care provider to get one. Talk to your pediatrician regarding the use of this medicine in children. While  infusions in a clinic may be prescribed for children as young as 2 years for selected conditions, precautions do apply. Overdosage: If you think you have taken too much of this medicine contact a poison control center or emergency room at once. NOTE: This medicine is only for you. Do not share this medicine with others. What if I miss a dose? This medicine is used once a week if given by injection under the skin. If you miss a dose, take it as soon as you can. If it is almost time for your next dose, take only that dose. Do not take double or extra doses. If you are to be given an infusion of this medicine, it is important not to miss your dose. Doses are usually every 4 weeks. Call your doctor or health care professional if you are unable to keep an appointment. What may interact with this medicine? Do not take this medicine with any of the following medications:  live vaccines This medicine may also interact with the following medications:  anakinra  baricitinib  canakinumab  medicines that lower your chance of fighting an infection  rituximab  TNF blockers such as adalimumab, certolizumab, etanercept, golimumab, infliximab  tocilizumab  tofacitinib  upadacitinib  ustekinumab This list may not describe all possible interactions. Give your health care provider a list of all the medicines, herbs, non-prescription drugs, or dietary supplements you use. Also tell them if you smoke, drink alcohol, or use illegal drugs. Some items may interact with your medicine. What should I watch for while using this medicine? Visit your doctor for regular checks on your   progress. Tell your doctor or health care professional if your symptoms do not start to get better or if they get worse. You will be tested for tuberculosis (TB) before you start this medicine. If your doctor prescribed any medicine for TB, you should start taking the TB medicine before starting this medicine. Make sure to finish the  full course of TB medicine. This medicine may increase your risk of getting an infection. Call your doctor or health care professional if you get fever, chills, or sore throat, or other symptoms of a cold or flu. Do not treat yourself. Try to avoid being around people who are sick. If you have diabetes and are getting this medicine in a vein, the infusion can give false high blood sugar readings on the day of your dose. This may happen if you use certain types of blood glucose tests. Your health care provider may tell you to use a different way to monitor your blood sugar levels. What side effects may I notice from receiving this medicine? Side effects that you should report to your doctor or health care professional as soon as possible:  allergic reactions like skin rash, itching or hives, swelling of the face, lips, or tongue  breathing problems  chest pain  dizziness  signs and symptoms of infection like fever; chills; cough; sore throat; pain or trouble passing urine  unusually weak or tired Side effects that usually do not require medical attention (report to your doctor or health care professional if they continue or are bothersome):  diarrhea  headache  nausea  pain, redness, or irritation at site where injected  stomach pain or upset This list may not describe all possible side effects. Call your doctor for medical advice about side effects. You may report side effects to FDA at 1-800-FDA-1088. Where should I keep my medicine? Infusions will be given in a hospital or clinic and will not be stored at home. Storage for syringes and autoinjectors stored at home: Keep out of the reach of children. Store in a refrigerator between 2 and 8 degrees C (36 and 46 degrees F). Keep this medicine in the original container. Protect from light. Do not freeze. Do not shake. Throw away any unused medicine after the expiration date. NOTE: This sheet is a summary. It may not cover all possible  information. If you have questions about this medicine, talk to your doctor, pharmacist, or health care provider.  2021 Elsevier/Gold Standard (2019-03-10 14:01:21)  Standing Labs We placed an order today for your standing lab work.   Please have your standing labs drawn in 1 month after starting Orencia then every 3 months  If possible, please have your labs drawn 2 weeks prior to your appointment so that the provider can discuss your results at your appointment.  We have open lab daily Monday through Thursday from 8:30-12:30 PM and 1:30-4:30 PM and Friday from 8:30-12:30 PM and 1:30-4:00 PM at the office of Dr. Bo Merino, [facility].   Please be advised, patients with office appointments requiring lab work will take precedents over walk-in lab work.  If possible, please come for your lab work on Monday and Friday afternoons, as you may experience shorter wait times. The office is located at [address], [place], [place], [place] [address] No appointment is necessary.   Labs are drawn by [facility]. Please bring your co-pay at the time of your lab draw.  You may receive a bill from [facility] for your lab work.  If you wish to have your labs drawn at another location, please call the office 24 hours in advance to send orders.  If you have any questions regarding directions or hours of operation,  please call [phone].   As a reminder, please drink plenty of water prior to coming for your lab work. Thanks!  COVID-19 vaccine recommendations:   COVID-19 vaccine is recommended for everyone (unless you are allergic to a vaccine component), even if you are on a medication that suppresses your immune system.   If you are on Methotrexate, Cellcept (mycophenolate), Rinvoq, Morrie Sheldon, and Olumiant- hold the medication for 1 week after each vaccine. Hold Methotrexate for 2 weeks after the single dose COVID-19 vaccine.   If you are on Orencia subcutaneous injection - hold  medication one week prior to and one week after the first COVID-19 vaccine dose (only).   Do not take Tylenol or any anti-inflammatory medications (NSAIDs) 24 hours prior to the COVID-19 vaccination.   There is no direct evidence about the efficacy of the COVID-19 vaccine in individuals who are on medications that suppress the immune system.   Even if you are fully vaccinated, and you are on any medications that suppress your immune system, please continue to wear a mask, maintain at least six feet social distance and practice hand hygiene.   If you develop a COVID-19 infection, please contact your PCP or our office to determine if you need monoclonal antibody infusion.  The booster vaccine is now available for immunocompromised patients.   Please see the following web sites for updated information.   [email or web address]

## 2020-10-13 NOTE — Telephone Encounter (Signed)
Submitted a Prior Authorization request to [facility] for [facility] via Cover My Meds. Will update once we receive a response.   Key: [id number] - PA Case ID: [id number]

## 2020-10-14 ENCOUNTER — Ambulatory Visit: Payer: 59 | Admitting: Rheumatology

## 2020-10-14 DIAGNOSIS — M8588 Other specified disorders of bone density and structure, other site: Secondary | ICD-10-CM

## 2020-10-14 DIAGNOSIS — Z79899 Other long term (current) drug therapy: Secondary | ICD-10-CM

## 2020-10-14 DIAGNOSIS — M0579 Rheumatoid arthritis with rheumatoid factor of multiple sites without organ or systems involvement: Secondary | ICD-10-CM

## 2020-10-14 DIAGNOSIS — Z8709 Personal history of other diseases of the respiratory system: Secondary | ICD-10-CM

## 2020-10-14 DIAGNOSIS — M65331 Trigger finger, right middle finger: Secondary | ICD-10-CM

## 2020-10-14 DIAGNOSIS — R202 Paresthesia of skin: Secondary | ICD-10-CM

## 2020-10-14 DIAGNOSIS — M797 Fibromyalgia: Secondary | ICD-10-CM

## 2020-10-14 DIAGNOSIS — R768 Other specified abnormal immunological findings in serum: Secondary | ICD-10-CM

## 2020-10-14 DIAGNOSIS — Z8659 Personal history of other mental and behavioral disorders: Secondary | ICD-10-CM

## 2020-10-14 NOTE — Telephone Encounter (Signed)
Patient has positive ANA and positive RNP.  I would prefer for her to be on Orencia.  Please appeal for Orencia.

## 2020-10-14 NOTE — Telephone Encounter (Signed)
Received a fax regarding Prior Authorization from [facility] for [facility]. Authorization has been DENIED because You have a history of failure, contraindication, or intolerance to two of the following preferred products* (document drug, date, and duration of trial): Cimzia, Humira, Simponi, Olumiant,Rinvoq, Xeljanz/Xeljanz XR).  The information provided does not show that you meet the criteria listed above.Marland Kitchen

## 2020-10-18 ENCOUNTER — Encounter: Payer: Self-pay | Admitting: *Deleted

## 2020-10-18 ENCOUNTER — Telehealth: Payer: Self-pay | Admitting: *Deleted

## 2020-10-18 DIAGNOSIS — R778 Other specified abnormalities of plasma proteins: Secondary | ICD-10-CM

## 2020-10-18 LAB — CBC WITH DIFFERENTIAL/PLATELET
Absolute Monocytes: 355 cells/uL (ref 200–950)
Basophils Absolute: 31 cells/uL (ref 0–200)
Basophils Relative: 0.8 %
Eosinophils Absolute: 109 cells/uL (ref 15–500)
Eosinophils Relative: 2.8 %
HCT: 39.5 % (ref 35.0–45.0)
Hemoglobin: 12.9 g/dL (ref 11.7–15.5)
Lymphs Abs: 1244 cells/uL (ref 850–3900)
MCH: 28 pg (ref 27.0–33.0)
MCHC: 32.7 g/dL (ref 32.0–36.0)
MCV: 85.7 fL (ref 80.0–100.0)
MPV: 11.1 fL (ref 7.5–12.5)
Monocytes Relative: 9.1 %
Neutro Abs: 2161 cells/uL (ref 1500–7800)
Neutrophils Relative %: 55.4 %
Platelets: 272 10*3/uL (ref 140–400)
RBC: 4.61 10*6/uL (ref 3.80–5.10)
RDW: 12.6 % (ref 11.0–15.0)
Total Lymphocyte: 31.9 %
WBC: 3.9 10*3/uL (ref 3.8–10.8)

## 2020-10-18 LAB — PROTEIN ELECTROPHORESIS, SERUM, WITH REFLEX
Abnormal Protein Band1: 0.7 g/dL — ABNORMAL HIGH
Albumin ELP: 3.8 g/dL (ref 3.8–4.8)
Alpha 1: 0.3 g/dL (ref 0.2–0.3)
Alpha 2: 0.9 g/dL (ref 0.5–0.9)
Beta 2: 0.4 g/dL (ref 0.2–0.5)
Beta Globulin: 0.4 g/dL (ref 0.4–0.6)
Gamma Globulin: 1.3 g/dL (ref 0.8–1.7)
Total Protein: 7.1 g/dL (ref 6.1–8.1)

## 2020-10-18 LAB — QUANTIFERON-TB GOLD PLUS
Mitogen-NIL: >10 IU/mL
NIL: 0.05 IU/mL
QuantiFERON-TB Gold Plus: NEGATIVE
TB1-NIL: <0 IU/mL
TB2-NIL: <0 IU/mL

## 2020-10-18 LAB — COMPLETE METABOLIC PANEL WITH GFR
AG Ratio: 1.4 (calc) (ref 1.0–2.5)
ALT: 13 U/L (ref 6–29)
AST: 15 U/L (ref 10–35)
Albumin: 4 g/dL (ref 3.6–5.1)
Alkaline phosphatase (APISO): 80 U/L (ref 37–153)
BUN: 9 mg/dL (ref 7–25)
CO2: 26 mmol/L (ref 20–32)
Calcium: 9 mg/dL (ref 8.6–10.4)
Chloride: 103 mmol/L (ref 98–110)
Creat: 0.54 mg/dL (ref 0.50–1.05)
GFR, Est African American: 126 mL/min/{1.73_m2} (ref >=60)
GFR, Est Non African American: 108 mL/min/{1.73_m2} (ref >=60)
Globulin: 2.8 g/dL (calc) (ref 1.9–3.7)
Glucose, Bld: 96 mg/dL (ref 65–99)
Potassium: 4.5 mmol/L (ref 3.5–5.3)
Sodium: 140 mmol/L (ref 135–146)
Total Bilirubin: 0.3 mg/dL (ref 0.2–1.2)
Total Protein: 6.8 g/dL (ref 6.1–8.1)

## 2020-10-18 LAB — HEPATITIS B SURFACE ANTIGEN: Hepatitis B Surface Ag: NONREACTIVE

## 2020-10-18 LAB — HEPATITIS C ANTIBODY
Hepatitis C Ab: NONREACTIVE
SIGNAL TO CUT-OFF: 0.01 (ref <1.00)

## 2020-10-18 LAB — IFE INTERPRETATION

## 2020-10-18 LAB — HEPATITIS B CORE ANTIBODY, IGM: Hep B C IgM: NONREACTIVE

## 2020-10-18 LAB — HIV ANTIBODY (ROUTINE TESTING W REFLEX): HIV 1&2 Ab, 4th Generation: NONREACTIVE

## 2020-10-18 NOTE — Progress Notes (Signed)
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  Please refer patient to hematology for evaluation.

## 2020-10-18 NOTE — Telephone Encounter (Signed)
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  Please refer patient to hematology for evaluation.

## 2020-10-19 ENCOUNTER — Telehealth: Payer: Self-pay | Admitting: Hematology and Oncology

## 2020-10-19 ENCOUNTER — Ambulatory Visit: Payer: Self-pay

## 2020-10-19 NOTE — Telephone Encounter (Signed)
Received a new hem referral from Dr. Estanislado Pandy for abnl spep. Pt has been cld and scheduled to see Dr. Chryl Heck on 2/16 at 9am. Pt aware to arrive 20 minutes early.

## 2020-10-24 ENCOUNTER — Telehealth: Payer: Self-pay

## 2020-10-24 NOTE — Telephone Encounter (Signed)
I called patient, patient will be on antibiotics x 2 weeks for infection, patient advised to hold MTX until she has finished her antibiotics, patient advised to take IBU or Tylenol for pain per Hazel Sams, PA-C. Patient is concerned about having a flare and will call the office if a flare occurs.

## 2020-10-24 NOTE — Telephone Encounter (Signed)
Patient left a voicemail stating "she has an issue that just came up today that she needs to discuss with Dr. Arlean Hopping nurse."  Patient states it is in reference to the Methotrexate injection which she is due to take today "so it is urgent that I speak with someone before 5:00 pm" so I'll know what to do.

## 2020-10-26 ENCOUNTER — Encounter: Payer: Self-pay | Admitting: Rheumatology

## 2020-10-26 ENCOUNTER — Other Ambulatory Visit: Payer: Self-pay

## 2020-10-26 ENCOUNTER — Ambulatory Visit (INDEPENDENT_AMBULATORY_CARE_PROVIDER_SITE_OTHER): Payer: 59

## 2020-10-26 DIAGNOSIS — J4540 Moderate persistent asthma, uncomplicated: Secondary | ICD-10-CM | POA: Diagnosis not present

## 2020-11-01 ENCOUNTER — Telehealth: Payer: Self-pay | Admitting: *Deleted

## 2020-11-01 NOTE — Telephone Encounter (Signed)
Patient contacted the office to ask if she can take Ibuprofen with the antibiotics she is on for H. Pylori. Patient advised she should contact the prescribing doctor to ask him. Patient expressed understanding.

## 2020-11-02 ENCOUNTER — Telehealth: Payer: Self-pay | Admitting: Hematology and Oncology

## 2020-11-02 ENCOUNTER — Other Ambulatory Visit: Payer: Self-pay

## 2020-11-02 ENCOUNTER — Encounter: Payer: Self-pay | Admitting: Hematology and Oncology

## 2020-11-02 ENCOUNTER — Inpatient Hospital Stay: Payer: 59

## 2020-11-02 ENCOUNTER — Inpatient Hospital Stay: Payer: 59 | Attending: Hematology and Oncology | Admitting: Hematology and Oncology

## 2020-11-02 VITALS — BP 141/84 | HR 91 | Temp 96.9°F | Resp 18 | Ht 61.25 in | Wt 194.2 lb

## 2020-11-02 DIAGNOSIS — Z800 Family history of malignant neoplasm of digestive organs: Secondary | ICD-10-CM | POA: Diagnosis not present

## 2020-11-02 DIAGNOSIS — Z90710 Acquired absence of both cervix and uterus: Secondary | ICD-10-CM

## 2020-11-02 DIAGNOSIS — M069 Rheumatoid arthritis, unspecified: Secondary | ICD-10-CM

## 2020-11-02 DIAGNOSIS — D472 Monoclonal gammopathy: Secondary | ICD-10-CM | POA: Diagnosis present

## 2020-11-02 DIAGNOSIS — Z129 Encounter for screening for malignant neoplasm, site unspecified: Secondary | ICD-10-CM

## 2020-11-02 DIAGNOSIS — Z862 Personal history of diseases of the blood and blood-forming organs and certain disorders involving the immune mechanism: Secondary | ICD-10-CM | POA: Diagnosis not present

## 2020-11-02 DIAGNOSIS — J45909 Unspecified asthma, uncomplicated: Secondary | ICD-10-CM | POA: Diagnosis not present

## 2020-11-02 DIAGNOSIS — F418 Other specified anxiety disorders: Secondary | ICD-10-CM

## 2020-11-02 DIAGNOSIS — D509 Iron deficiency anemia, unspecified: Secondary | ICD-10-CM

## 2020-11-02 LAB — CBC WITH DIFFERENTIAL/PLATELET
Abs Immature Granulocytes: 0.01 10*3/uL (ref 0.00–0.07)
Basophils Absolute: 0 10*3/uL (ref 0.0–0.1)
Basophils Relative: 1 %
Eosinophils Absolute: 0.1 10*3/uL (ref 0.0–0.5)
Eosinophils Relative: 3 %
HCT: 37.3 % (ref 36.0–46.0)
Hemoglobin: 12.3 g/dL (ref 12.0–15.0)
Immature Granulocytes: 0 %
Lymphocytes Relative: 32 %
Lymphs Abs: 1.3 10*3/uL (ref 0.7–4.0)
MCH: 27.8 pg (ref 26.0–34.0)
MCHC: 33 g/dL (ref 30.0–36.0)
MCV: 84.4 fL (ref 80.0–100.0)
Monocytes Absolute: 0.4 10*3/uL (ref 0.1–1.0)
Monocytes Relative: 8 %
Neutro Abs: 2.3 10*3/uL (ref 1.7–7.7)
Neutrophils Relative %: 56 %
Platelets: 221 10*3/uL (ref 150–400)
RBC: 4.42 MIL/uL (ref 3.87–5.11)
RDW: 12.1 % (ref 11.5–15.5)
WBC: 4.2 10*3/uL (ref 4.0–10.5)
nRBC: 0 % (ref 0.0–0.2)

## 2020-11-02 LAB — CMP (CANCER CENTER ONLY)
ALT: 18 U/L (ref 0–44)
AST: 18 U/L (ref 15–41)
Albumin: 3.7 g/dL (ref 3.5–5.0)
Alkaline Phosphatase: 92 U/L (ref 38–126)
Anion gap: 6 (ref 5–15)
BUN: 10 mg/dL (ref 6–20)
CO2: 26 mmol/L (ref 22–32)
Calcium: 8.7 mg/dL — ABNORMAL LOW (ref 8.9–10.3)
Chloride: 108 mmol/L (ref 98–111)
Creatinine: 0.72 mg/dL (ref 0.44–1.00)
GFR, Estimated: >60 mL/min (ref >60)
Glucose, Bld: 126 mg/dL — ABNORMAL HIGH (ref 70–99)
Potassium: 3.9 mmol/L (ref 3.5–5.1)
Sodium: 140 mmol/L (ref 135–145)
Total Bilirubin: 0.3 mg/dL (ref 0.3–1.2)
Total Protein: 7.1 g/dL (ref 6.5–8.1)

## 2020-11-02 LAB — IRON AND TIBC
Iron: 73 ug/dL (ref 41–142)
Saturation Ratios: 27 % (ref 21–57)
TIBC: 271 ug/dL (ref 236–444)
UIBC: 198 ug/dL (ref 120–384)

## 2020-11-02 LAB — FERRITIN: Ferritin: 115 ng/mL (ref 11–307)

## 2020-11-02 NOTE — Progress Notes (Signed)
[facility] [place] CONSULT NOTE  Patient Care Team: Suella Broad, FNP as PCP - General (Nurse Practitioner)  CHIEF COMPLAINTS/PURPOSE OF CONSULTATION:  Abnormal SPEP  ASSESSMENT & PLAN:  No problem-specific Assessment & Plan notes found for this encounter.  No orders of the defined types were placed in this encounter.  This is a pleasant 55 year old female patient with a past medical history significant for rheumatoid arthritis, asthma, iron deficiency anemia referred to hematology for evaluation of abnormal labs. 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   Review of systems pertinent for ongoing heartburn, hoarseness from GERD, H. pylori infection joint pains from rheumatoid arthritis. Physical examination today unremarkable for any lymphadenopathy or hepatosplenomegaly. I reviewed labs, faint IgG kappa monoclonal gammopathy noted. Labs ordered today including CBC, CMP, SPEP, kappa lambda ratio, UPEP, immunoglobulin levels. 2-week telephone visit to review labs and to discuss additional recommendations.  If this is indeed a low risk MGUS, will monitor every 6 months to 12 months We have discussed that MGUS is a precancerous condition and there is a risk of transformation to multiple myeloma at about 1% a year if she is indeed low risk MGUS.  2.  History of iron deficiency anemia.  Not on any iron supplementation currently. I wanted iron panel and ferritin today to evaluate iron deficiency anemia again.  3.  Counseled about age-appropriate cancer screening.  She is up-to-date with her mammograms.  4.  Situational anxiety, no suicidal or homicidal intentions.  Recommended patient discuss with her primary care physician for referral to psychology or counseling depending on her needs.  She and husband expressed understanding.  HISTORY OF PRESENTING ILLNESS:   Julie [name] 55 y.o. female is here because of  abnormal SPEP.  This is a very pleasant 55 year old female patient with past medical history significant for rheumatoid arthritis, asthma, iron deficiency anemia referred to hematology for evaluation of abnormal labs.  She was found to have some monoclonal gammopathy and her dad died of multiple myeloma and she is worried about this visit.  Julie [name] has also ongoing H. pylori infection and is on antibiotics which is causing her some diarrhea.  She otherwise has baseline joint pains rheumatoid arthritis, uncontrolled asthma.  No fevers, drenching night sweats.  Some loss of appetite and some mild weight loss of 5 pounds in the past couple of months because of a lot of stress.  Breathing is comfortable, some hoarseness from severe GERD.  No change in urinary habits.  No new neurological complaints.  Rest of the pertinent 10 point ROS negative except as mentioned above.  REVIEW OF SYSTEMS:   Constitutional: Denies fevers, chills or abnormal night sweats Eyes: Denies blurriness of vision, double vision or watery eyes Ears, nose, mouth, throat, and face: Denies mucositis or sore throat Respiratory: Denies cough, dyspnea or wheezes Cardiovascular: Denies palpitation, chest discomfort or lower extremity swelling Gastrointestinal: As mentioned above skin: Denies abnormal skin rashes Lymphatics: Denies new lymphadenopathy or easy bruising Neurological:Denies numbness, tingling or new weaknesses Behavioral/Psych: She is anxious.   All other systems were reviewed with the patient and are negative.  MEDICAL HISTORY:  Past Medical History:  Diagnosis Date  . Anemia   . Asthma   . Complication of anesthesia   . Eczema   . Esophageal dysphagia   . GERD (gastroesophageal reflux disease)   . PONV (postoperative nausea and vomiting)   . Rheumatoid arthritis ([place])     SURGICAL  HISTORY: Past Surgical History:  Procedure Laterality Date  . ABDOMINAL HYSTERECTOMY     partial age 57  . BILATERAL  SALPINGECTOMY Bilateral 05/25/2015   Procedure: BILATERAL SALPINGECTOMY;  Surgeon: Servando Salina, MD;  Location: [facility];  Service: Gynecology;  Laterality: Bilateral;  . CARPAL TUNNEL RELEASE Left 01/05/2020   Procedure: LEFT CARPAL TUNNEL RELEASE;  Surgeon: Daryll Brod, MD;  Location: [facility];  Service: Orthopedics;  Laterality: Left;  FOREARM BIER BLOCK  . COLONOSCOPY    . OVARIAN CYST REMOVAL Right 05/25/2015   Procedure: OVARIAN CYSTECTOMY;  Surgeon: Servando Salina, MD;  Location: [facility];  Service: Gynecology;  Laterality: Right;  . TONSILLECTOMY    . TUBAL LIGATION      SOCIAL HISTORY: Social History   Socioeconomic History  . Marital status: Married    Spouse name: Not on file  . Number of children: Not on file  . Years of education: Not on file  . Highest education level: Not on file  Occupational History  . Not on file  Tobacco Use  . Smoking status: Never Smoker  . Smokeless tobacco: Never Used  Vaping Use  . Vaping Use: Never used  Substance and Sexual Activity  . Alcohol use: No  . Drug use: No  . Sexual activity: Not on file  Other Topics Concern  . Not on file  Social History Narrative  . Not on file   Social Determinants of Health   Financial Resource Strain: Not on file  Food Insecurity: Not on file  Transportation Needs: Not on file  Physical Activity: Not on file  Stress: Not on file  Social Connections: Not on file  Intimate Partner Violence: Not on file    FAMILY HISTORY: Family History  Problem Relation Age of Onset  . Asthma Mother   . Eczema Mother   . Sarcoidosis Mother   . Hypertension Mother   . Cancer Father   . Hypotension Father   . Congestive Heart Failure Father   . Colon cancer Father   . Multiple myeloma Father   . Asthma Sister   . GER disease Sister   . Allergic rhinitis Neg Hx   . Angioedema Neg Hx   . Immunodeficiency Neg Hx   . Urticaria Neg Hx   . Esophageal cancer Neg Hx   . Rectal cancer  Neg Hx   . Stomach cancer Neg Hx     ALLERGIES:  is allergic to aspirin, augmentin [amoxicillin-pot clavulanate], and peanut-containing drug products.  MEDICATIONS:  Current Outpatient Medications  Medication Sig Dispense Refill  . albuterol (PROAIR HFA) 108 (90 Base) MCG/ACT inhaler Inhale 2 puffs into the lungs every 4 (four) hours as needed. 18 g 1  . B-D TB SYRINGE 1CC/27GX1/2" 27G X 1/2" 1 ML MISC Use to inject Methotrexate weekly. 12 each 3  . busPIRone (BUSPAR) 5 MG tablet Take 5 mg by mouth 3 (three) times daily as needed.    . cetirizine (ZYRTEC) 10 MG tablet Zyrtec 10 mg tablet    . DEXILANT 60 MG capsule Take 1 capsule by mouth daily.    . diclofenac Sodium (VOLTAREN) 1 % GEL Apply 2g to 4g to affected area up to 4 times daily as needed. 400 g 4  . DUPIXENT 300 MG/2ML prefilled syringe Inject into the skin.    Marland Kitchen EPINEPHrine 0.3 mg/0.3 mL IJ SOAJ injection Inject 0.3 mLs (0.3 mg total) into the muscle as needed for anaphylaxis. 2 each 2  . escitalopram (LEXAPRO)  20 MG tablet Take 20 mg by mouth daily.  5  . famotidine (PEPCID) 40 MG tablet Take 40 mg by mouth daily.    . folic acid (FOLVITE) 1 MG tablet Take 1 tablet (1 mg total) by mouth 2 (two) times daily. 60 tablet 14  . hydroxychloroquine (PLAQUENIL) 200 MG tablet Take 1 tablet 200 mg BID Monday-Friday 120 tablet 0  . levocetirizine (XYZAL) 5 MG tablet Take 5 mg by mouth every evening.     . methotrexate 50 MG/2ML injection 1.0 mL subcutaneously every 7 days 12 mL 0  . montelukast (SINGULAIR) 10 MG tablet Take 10 mg by mouth daily.    . Multiple Vitamins-Minerals (DAILY MULTIVITAMIN PO) Take 1 tablet by mouth daily.     . ranitidine (ZANTAC) 300 MG tablet ranitidine 300 mg tablet     Current Facility-Administered Medications  Medication Dose Route Frequency Provider Last Rate Last Admin  . Dupilumab SOSY 300 mg  300 mg Subcutaneous Q14 Days Dara Hoyer, FNP   300 mg at 10/26/20 1339    PHYSICAL EXAMINATION: ECOG  PERFORMANCE STATUS: 0 - Asymptomatic  Vitals:   11/02/20 0910 11/02/20 0914  BP:  (!) 141/84  Pulse: 91   Resp: 18   Temp: (!) 96.9 F (36.1 C)   SpO2: 93%    Filed Weights   11/02/20 0910  Weight: 194 lb 3.2 oz (88.1 kg)    GENERAL:alert, no distress and comfortable SKIN: skin color, texture, turgor are normal, no rashes or significant lesions EYES: normal, conjunctiva are pink and non-injected, sclera clear OROPHARYNX:no exudate, no erythema and lips, buccal mucosa, and tongue normal  NECK: supple, thyroid normal size, non-tender, without nodularity LYMPH:  no palpable lymphadenopathy in the cervical, axillary or inguinal LUNGS: clear to auscultation and percussion with normal breathing effort HEART: regular rate & rhythm and no murmurs and no lower extremity edema ABDOMEN:abdomen soft, non-tender and normal bowel sounds Musculoskeletal:no cyanosis of digits and no clubbing  PSYCH: alert & oriented x 3 with fluent speech NEURO: no focal motor/sensory deficits  LABORATORY DATA:  I have reviewed the data as listed Lab Results  Component Value Date   WBC 3.9 10/13/2020   HGB 12.9 10/13/2020   HCT 39.5 10/13/2020   MCV 85.7 10/13/2020   PLT 272 10/13/2020     Chemistry      Component Value Date/Time   NA 140 10/13/2020 0903   NA 140 10/02/2016 1602   K 4.5 10/13/2020 0903   CL 103 10/13/2020 0903   CO2 26 10/13/2020 0903   BUN 9 10/13/2020 0903   BUN 8 10/02/2016 1602   CREATININE 0.54 10/13/2020 0903      Component Value Date/Time   CALCIUM 9.0 10/13/2020 0903   ALKPHOS 55 03/26/2017 1244   AST 15 10/13/2020 0903   ALT 13 10/13/2020 0903   BILITOT 0.3 10/13/2020 0903   BILITOT 0.4 10/02/2016 1602     I reviewed her SPEP, mild IgG kappa monoclonal gammopathy.  CBC is completely normal.  I have not seen a CMP recently.  Will repeat CBC, CMP, SPEP, UPEP, kappa lambda ratio, immunoglobulin levels, iron panel and ferritin today  RADIOGRAPHIC STUDIES: I have  personally reviewed the radiological images as listed and agreed with the findings in the report. No results found.  All questions were answered. The patient knows to call the clinic with any problems, questions or concerns. I spent 45 minutes in the care of this patient including H and P, review  of records, counseling and coordination of care.     Benay Pike, MD 11/02/2020 9:17 AM

## 2020-11-02 NOTE — Telephone Encounter (Signed)
Scheduled appointments per 2/16 los. Spoke to patient who is aware of appointments dates and times. Gave patient calendar print out.

## 2020-11-03 LAB — KAPPA/LAMBDA LIGHT CHAINS
Kappa free light chain: 25.6 mg/L — ABNORMAL HIGH (ref 3.3–19.4)
Kappa, lambda light chain ratio: 1.79 — ABNORMAL HIGH (ref 0.26–1.65)
Lambda free light chains: 14.3 mg/L (ref 5.7–26.3)

## 2020-11-03 LAB — IGG, IGA, IGM
IgA: 168 mg/dL (ref 87–352)
IgG (Immunoglobin G), Serum: 1350 mg/dL (ref 586–1602)
IgM (Immunoglobulin M), Srm: 105 mg/dL (ref 26–217)

## 2020-11-04 DIAGNOSIS — D472 Monoclonal gammopathy: Secondary | ICD-10-CM | POA: Diagnosis not present

## 2020-11-08 LAB — UPEP/TP, 24-HR URINE
Albumin, U: 0 %
Alpha 1, Urine: 0 %
Alpha 2, Urine: 0 %
Beta, Urine: 0 %
Gamma Globulin, Urine: 0 %
Total Protein, Urine-Ur/day: 137 mg/24 hr (ref 30–150)
Total Protein, Urine: 7.8 mg/dL
Total Volume: 1750

## 2020-11-08 LAB — PROTEIN ELECTROPHORESIS, SERUM, WITH REFLEX
A/G Ratio: 1.2 (ref 0.7–1.7)
Albumin ELP: 3.5 g/dL (ref 2.9–4.4)
Alpha-1-Globulin: 0.2 g/dL (ref 0.0–0.4)
Alpha-2-Globulin: 0.6 g/dL (ref 0.4–1.0)
Beta Globulin: 0.9 g/dL (ref 0.7–1.3)
Gamma Globulin: 1.2 g/dL (ref 0.4–1.8)
Globulin, Total: 2.9 g/dL (ref 2.2–3.9)
M-Spike, %: 0.3 g/dL — ABNORMAL HIGH
SPEP Interpretation: 0
Total Protein ELP: 6.4 g/dL (ref 6.0–8.5)

## 2020-11-08 LAB — IMMUNOFIXATION REFLEX, SERUM
IgA: 192 mg/dL (ref 87–352)
IgG (Immunoglobin G), Serum: 1540 mg/dL (ref 586–1602)
IgM (Immunoglobulin M), Srm: 107 mg/dL (ref 26–217)

## 2020-11-09 ENCOUNTER — Ambulatory Visit (INDEPENDENT_AMBULATORY_CARE_PROVIDER_SITE_OTHER): Payer: 59 | Admitting: *Deleted

## 2020-11-09 DIAGNOSIS — J4540 Moderate persistent asthma, uncomplicated: Secondary | ICD-10-CM

## 2020-11-09 NOTE — Telephone Encounter (Addendum)
Appeal for [place] faxed to [facility] including appeal letter, supporting literature, and most recent chart notes - marked as urgent.  Fax: [phone]

## 2020-11-14 ENCOUNTER — Telehealth: Payer: Self-pay

## 2020-11-14 NOTE — Telephone Encounter (Signed)
Julie [name] from [facility] left a voicemail on Saturday, 11/12/20 at 3:08 pm regarding the urgent appeal they received for the patient.  The medication Julie [name] has been approved.  There is a determination letter going out to the provider and patient regarding the specifics of the approval.  If you have any questions, please call [phone]  Please reference Case #[id number]

## 2020-11-14 NOTE — Telephone Encounter (Signed)
Requested letter of [place] approval letter to be faxed to clinic. Provided [facility] with clinic fax number.

## 2020-11-15 ENCOUNTER — Telehealth: Payer: Self-pay | Admitting: Hematology and Oncology

## 2020-11-15 NOTE — Telephone Encounter (Signed)
Received notification from [facility] that prior auth denial was overturned. Patient's Orencia SQ Clickject is approved through 11/11/21.  Patient is currently being treated for H. Pylori.  Rx for bismuth, tetracycline, and metronidazole picked up 10/26/20 - patient reports that she will complete abx course this Thursday, 11/17/20.  GI will repeat stool test 6-8 weeks from this date (around mid-March).  Patient will call GI to schedule repeat stool test. Advised her to have GI notify us of result or she can call our clinic so we can get clearance from GI to start [place].  Will set reminder to f/u in 3 weeks.  Knox Saliva, PharmD, MPH Clinical Pharmacist (Rheumatology and Pulmonology)

## 2020-11-15 NOTE — Telephone Encounter (Signed)
Left message for patient to verify telephone visit for pre reg °

## 2020-11-16 ENCOUNTER — Encounter: Payer: Self-pay | Admitting: Hematology and Oncology

## 2020-11-16 ENCOUNTER — Telehealth: Payer: Self-pay | Admitting: Hematology and Oncology

## 2020-11-16 ENCOUNTER — Telehealth: Payer: Self-pay

## 2020-11-16 ENCOUNTER — Inpatient Hospital Stay: Payer: 59 | Attending: Hematology and Oncology | Admitting: Hematology and Oncology

## 2020-11-16 DIAGNOSIS — D472 Monoclonal gammopathy: Secondary | ICD-10-CM

## 2020-11-16 NOTE — Telephone Encounter (Signed)
Patient called to let Dr. Estanislado Pandy know that the labwork she had with Dr. Chryl Heck are all complete and the results are in her chart.

## 2020-11-16 NOTE — Progress Notes (Signed)
[facility] CONSULT NOTE  Patient Care Team: Suella Broad, FNP as PCP - General (Nurse Practitioner)  CHIEF COMPLAINTS/PURPOSE OF CONSULTATION:  Abnormal SPEP  ASSESSMENT & PLAN:  No problem-specific Assessment & Plan notes found for this encounter.  No orders of the defined types were placed in this encounter.  This is a pleasant 55 year old female patient with a past medical history significant for rheumatoid arthritis, asthma, iron deficiency anemia referred to hematology for evaluation of abnormal labs. 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   Review of systems pertinent for ongoing heartburn, hoarseness from GERD, H. pylori infection joint pains from rheumatoid arthritis. Labs reviewed today including CBC, CMP, SPEP, kappa lambda ratio, UPEP, immunoglobulin levels. Faint Ig G kappa monoclonal protein at 0.3 g/dl, with mildly elevated K/L ratio. No other evidence of end organ damage.  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 She will RTC in 6 months with repeat labs.  2.  History of iron deficiency anemia.  Resolved  3.  Counseled about age-appropriate cancer screening.  She is up-to-date with her mammograms.  HISTORY OF PRESENTING ILLNESS:   Julie [name] 55 y.o. female is here because of abnormal SPEP.  This is a very pleasant 55 year old female patient with past medical history significant for rheumatoid arthritis, asthma, iron deficiency anemia referred to hematology for evaluation of abnormal labs.  She was found to have some monoclonal gammopathy and her dad died of multiple myeloma and hence referred to hematology  She is here for a telephone visit. She reports no new complaints since last visit.  During her last visit, she  reported GERD, joint pains and some situational anxiety. Rest of the pertinent 10 point ROS negative except as mentioned above  MEDICAL HISTORY:  Past Medical History:  Diagnosis Date  . Anemia   . Asthma   . Complication of anesthesia   . Eczema   . Esophageal dysphagia   . GERD (gastroesophageal reflux disease)   . PONV (postoperative nausea and vomiting)   . Rheumatoid arthritis ([place])     SURGICAL HISTORY: Past Surgical History:  Procedure Laterality Date  . ABDOMINAL HYSTERECTOMY     partial age 75  . BILATERAL SALPINGECTOMY Bilateral 05/25/2015   Procedure: BILATERAL SALPINGECTOMY;  Surgeon: Servando Salina, MD;  Location: [facility];  Service: Gynecology;  Laterality: Bilateral;  . CARPAL TUNNEL RELEASE Left 01/05/2020   Procedure: LEFT CARPAL TUNNEL RELEASE;  Surgeon: Daryll Brod, MD;  Location: [facility];  Service: Orthopedics;  Laterality: Left;  FOREARM BIER BLOCK  . COLONOSCOPY    . OVARIAN CYST REMOVAL Right 05/25/2015   Procedure: OVARIAN CYSTECTOMY;  Surgeon: Servando Salina, MD;  Location: [facility];  Service: Gynecology;  Laterality: Right;  . TONSILLECTOMY    . TUBAL LIGATION      SOCIAL HISTORY: Social History   Socioeconomic History  . Marital status: Married    Spouse name: Not on file  . Number of children: Not on file  . Years of education: Not on file  . Highest education level: Not on file  Occupational History  . Not on file  Tobacco Use  . Smoking status: Never Smoker  . Smokeless tobacco: Never Used  Vaping Use  . Vaping Use: Never used  Substance and Sexual Activity  . Alcohol use: No  . Drug use: No  . Sexual activity: Not on file  Other Topics Concern  . Not on file  Social History Narrative  . Not on file   Social Determinants of Health   Financial Resource Strain: Not on file  Food Insecurity: Not on file  Transportation Needs: Not on file  Physical Activity: Not on file  Stress: Not on file  Social Connections:  Not on file  Intimate Partner Violence: Not on file    FAMILY HISTORY: Family History  Problem Relation Age of Onset  . Asthma Mother   . Eczema Mother   . Sarcoidosis Mother   . Hypertension Mother   . Cancer Father   . Hypotension Father   . Congestive Heart Failure Father   . Colon cancer Father   . Multiple myeloma Father   . Asthma Sister   . GER disease Sister   . Allergic rhinitis Neg Hx   . Angioedema Neg Hx   . Immunodeficiency Neg Hx   . Urticaria Neg Hx   . Esophageal cancer Neg Hx   . Rectal cancer Neg Hx   . Stomach cancer Neg Hx     ALLERGIES:  is allergic to aspirin, augmentin [amoxicillin-pot clavulanate], and peanut-containing drug products.  MEDICATIONS:  Current Outpatient Medications  Medication Sig Dispense Refill  . albuterol (PROAIR HFA) 108 (90 Base) MCG/ACT inhaler Inhale 2 puffs into the lungs every 4 (four) hours as needed. 18 g 1  . B-D TB SYRINGE 1CC/27GX1/2" 27G X 1/2" 1 ML MISC Use to inject Methotrexate weekly. 12 each 3  . Bismuth Subsalicylate 354 MG TABS Take 1 tablet by mouth in the morning, at noon, in the evening, and at bedtime.    . busPIRone (BUSPAR) 5 MG tablet Take 5 mg by mouth 3 (three) times daily as needed.    . cetirizine (ZYRTEC) 10 MG tablet Zyrtec 10 mg tablet    . DEXILANT 60 MG capsule Take 1 capsule by mouth daily.    . diclofenac Sodium (VOLTAREN) 1 % GEL Apply 2g to 4g to affected area up to 4 times daily as needed. 400 g 4  . DUPIXENT 300 MG/2ML prefilled syringe Inject into the skin.    Marland Kitchen EPINEPHrine 0.3 mg/0.3 mL IJ SOAJ injection Inject 0.3 mLs (0.3 mg total) into the muscle as needed for anaphylaxis. 2 each 2  . escitalopram (LEXAPRO) 20 MG tablet Take 20 mg by mouth daily.  5  . famotidine (PEPCID) 40 MG tablet Take 40 mg by mouth daily.    . folic acid (FOLVITE) 1 MG tablet Take 1 tablet (1 mg total) by mouth 2 (two) times daily. 60 tablet 14  . hydroxychloroquine (PLAQUENIL) 200 MG tablet Take 1 tablet 200 mg  BID Monday-Friday 120 tablet 0  . levocetirizine (XYZAL) 5 MG tablet Take 5 mg by mouth every evening.     . methotrexate 50 MG/2ML injection 1.0 mL subcutaneously every 7 days (Patient not taking: Reported on 11/02/2020) 12 mL 0  . metroNIDAZOLE (FLAGYL) 250 MG tablet Take 1 tablet by mouth in the morning, at noon, in the evening, and at bedtime.    . montelukast (SINGULAIR) 10 MG tablet Take 10 mg by mouth daily.    . Multiple Vitamins-Minerals (DAILY MULTIVITAMIN PO) Take 1 tablet by mouth daily.     . ranitidine (ZANTAC) 300 MG tablet ranitidine 300 mg tablet    . tetracycline (SUMYCIN) 500 MG  capsule Take 1 capsule by mouth in the morning, at noon, in the evening, and at bedtime.     Current Facility-Administered Medications  Medication Dose Route Frequency Provider Last Rate Last Admin  . Dupilumab SOSY 300 mg  300 mg Subcutaneous Q14 Days Dara Hoyer, FNP   300 mg at 11/09/20 1022    PHYSICAL EXAMINATION: ECOG PERFORMANCE STATUS: 0 - Asymptomatic  There were no vitals filed for this visit. There were no vitals filed for this visit.  PE not done, telephone visit.  LABORATORY DATA:  I have reviewed the data as listed Lab Results  Component Value Date   WBC 4.2 11/02/2020   HGB 12.3 11/02/2020   HCT 37.3 11/02/2020   MCV 84.4 11/02/2020   PLT 221 11/02/2020     Chemistry      Component Value Date/Time   NA 140 11/02/2020 0954   NA 140 10/02/2016 1602   K 3.9 11/02/2020 0954   CL 108 11/02/2020 0954   CO2 26 11/02/2020 0954   BUN 10 11/02/2020 0954   BUN 8 10/02/2016 1602   CREATININE 0.72 11/02/2020 0954   CREATININE 0.54 10/13/2020 0903      Component Value Date/Time   CALCIUM 8.7 (L) 11/02/2020 0954   ALKPHOS 92 11/02/2020 0954   AST 18 11/02/2020 0954   ALT 18 11/02/2020 0954   BILITOT 0.3 11/02/2020 0954     I reviewed her labs in detail today with her. Ig G kappa MGUS,  No M protein in UPEP No evidence of end organ damage.  RADIOGRAPHIC STUDIES: I  have personally reviewed the radiological images as listed and agreed with the findings in the report. No results found.   I connected with  Julie [name] on 11/16/20 by a telephone application and verified that I am speaking with the correct person using two identifiers.   I discussed the limitations of evaluation and management by telemedicine. The patient expressed understanding and agreed to proceed.  All questions were answered. The patient knows to call the clinic with any problems, questions or concerns. I spent 20 minutes in the care of this patient including History, review of records, counseling and coordination of care.     Benay Pike, MD 11/16/2020 11:46 AM

## 2020-11-16 NOTE — Telephone Encounter (Signed)
Scheduled appointment per 03/02 schedule message. Contacted patient, patient is aware.

## 2020-11-22 ENCOUNTER — Other Ambulatory Visit: Payer: Self-pay | Admitting: Allergy

## 2020-11-22 ENCOUNTER — Other Ambulatory Visit: Payer: Self-pay | Admitting: Allergy & Immunology

## 2020-11-22 ENCOUNTER — Telehealth: Payer: Self-pay | Admitting: Allergy

## 2020-11-22 NOTE — Telephone Encounter (Signed)
Please advise 

## 2020-11-22 NOTE — Telephone Encounter (Signed)
Please call patient and tell her that Dexilant is not covered.  Was she ever on omeprazole 40mg ?   Is she still taking pepcid?

## 2020-11-23 ENCOUNTER — Ambulatory Visit (INDEPENDENT_AMBULATORY_CARE_PROVIDER_SITE_OTHER): Payer: 59

## 2020-11-23 DIAGNOSIS — J4540 Moderate persistent asthma, uncomplicated: Secondary | ICD-10-CM | POA: Diagnosis not present

## 2020-11-23 NOTE — Telephone Encounter (Signed)
Spoke with pt she is going to look into good rx and see which will be more effective for her and give [place] a call back if one is cheaper then the [facility] for [facility]

## 2020-11-23 NOTE — Telephone Encounter (Signed)
Please advise would otc be cheaper for her?

## 2020-11-23 NOTE — Telephone Encounter (Signed)
I'm sending in omeprazole 40mg  in the morning to take - this replaces Dexilant.  Continue with Pepcid at night.

## 2020-11-23 NOTE — Telephone Encounter (Signed)
Patient states omeprazole is over $70 and she would like an alternative or to see if we could do anything to lower the price.

## 2020-11-23 NOTE — Telephone Encounter (Signed)
I called the patient and informed her that Dexilant is not covered anymore. She stated that she has always been using the dexilant in the the morning an Pepcid at night. She still taking pepcid and has never used omeprazole 40mg 

## 2020-11-23 NOTE — Telephone Encounter (Signed)
She can use goodrx.com Looks like [employer], Architectural technologist is all less than $10.  Let us know what pharmacy she wants the prescription sent to.

## 2020-11-28 ENCOUNTER — Encounter: Payer: Self-pay | Admitting: Physician Assistant

## 2020-11-28 ENCOUNTER — Ambulatory Visit: Payer: 59 | Admitting: Physician Assistant

## 2020-11-28 ENCOUNTER — Telehealth: Payer: Self-pay | Admitting: Rheumatology

## 2020-11-28 ENCOUNTER — Other Ambulatory Visit: Payer: Self-pay

## 2020-11-28 VITALS — BP 135/88 | HR 80 | Ht 61.25 in | Wt 189.6 lb

## 2020-11-28 DIAGNOSIS — Z8659 Personal history of other mental and behavioral disorders: Secondary | ICD-10-CM

## 2020-11-28 DIAGNOSIS — M0579 Rheumatoid arthritis with rheumatoid factor of multiple sites without organ or systems involvement: Secondary | ICD-10-CM | POA: Diagnosis not present

## 2020-11-28 DIAGNOSIS — M797 Fibromyalgia: Secondary | ICD-10-CM | POA: Diagnosis not present

## 2020-11-28 DIAGNOSIS — Z8719 Personal history of other diseases of the digestive system: Secondary | ICD-10-CM

## 2020-11-28 DIAGNOSIS — Z8619 Personal history of other infectious and parasitic diseases: Secondary | ICD-10-CM

## 2020-11-28 DIAGNOSIS — Z8709 Personal history of other diseases of the respiratory system: Secondary | ICD-10-CM

## 2020-11-28 DIAGNOSIS — Z79899 Other long term (current) drug therapy: Secondary | ICD-10-CM | POA: Diagnosis not present

## 2020-11-28 DIAGNOSIS — R768 Other specified abnormal immunological findings in serum: Secondary | ICD-10-CM | POA: Diagnosis not present

## 2020-11-28 DIAGNOSIS — M65331 Trigger finger, right middle finger: Secondary | ICD-10-CM

## 2020-11-28 DIAGNOSIS — R778 Other specified abnormalities of plasma proteins: Secondary | ICD-10-CM

## 2020-11-28 DIAGNOSIS — R7689 Other specified abnormal immunological findings in serum: Secondary | ICD-10-CM

## 2020-11-28 DIAGNOSIS — M8588 Other specified disorders of bone density and structure, other site: Secondary | ICD-10-CM

## 2020-11-28 MED ORDER — PREDNISONE 5 MG PO TABS: ORAL_TABLET | ORAL | 0 refills | Status: DC

## 2020-11-28 NOTE — Telephone Encounter (Signed)
I called patient, patient scheduled 11/28/2020

## 2020-11-28 NOTE — Patient Instructions (Signed)
Standing Labs We placed an order today for your standing lab work.   Please have your standing labs drawn in May and every 3 months   If possible, please have your labs drawn 2 weeks prior to your appointment so that the provider can discuss your results at your appointment.  We have open lab daily Monday through Thursday from 1:30-4:30 PM and Friday from 1:30-4:00 PM at the office of Dr. Shaili Deveshwar, [facility].   Please be advised, all patients with office appointments requiring lab work will take precedents over walk-in lab work.  If possible, please come for your lab work on Monday and Friday afternoons, as you may experience shorter wait times. The office is located at [address], [address], , [place] [address] No appointment is necessary.   Labs are drawn by [facility]. Please bring your co-pay at the time of your lab draw.  You may receive a bill from [facility] for your lab work.  If you wish to have your labs drawn at another location, please call the office 24 hours in advance to send orders.  If you have any questions regarding directions or hours of operation,  please call [phone].   As a reminder, please drink plenty of water prior to coming for your lab work. Thanks!   

## 2020-11-28 NOTE — Progress Notes (Signed)
Office Visit Note  Patient: Julie [name]             Date of Birth: [birth year]           MRN: [id number]             PCP: Suella Broad, FNP Referring: Suella Broad,* Visit Date: 11/28/2020 Occupation: @GUAROCC @  Subjective:  Pain in multiple joints   History of Present Illness: Julie [name] is a 55 y.o. female with history of seropositive rheumatoid arthritis and fibromyalgia.  She is taking plaquenil 200 mg 1 tablet by mouth twice daily Monday through Friday only.  She has been holding injectable MTX for 3 weeks due to recently undergoing treatment for H Pylori.  She states she was advised to hold MTX for 6-8 weeks until she has returned for a stool sample.  She has yet to start on orencia due to being diagnosed with H pylori.  She presents today having a flare in multiple joints.  She is currently having severe pain in both shoulders, wrist joints, hands, and both feet.  She has noticed joint swelling in her wrists, hands, and both ankle joints.  She tried taking ibuprofen but did not notice any improvement in her symptoms.   She states she noticed the rheumatoid nodules on her elbows return last week and they have been very tender to touch.   Activities of Daily Living:  Patient reports joint stiffness all day  Patient Reports nocturnal pain.  Difficulty dressing/grooming: Reports Difficulty climbing stairs: Reports Difficulty getting out of chair: Reports Difficulty using hands for taps, buttons, cutlery, and/or writing: Reports  Review of Systems  Constitutional: Positive for fatigue.  HENT: Negative for mouth sores, mouth dryness and nose dryness.   Eyes: Negative for pain, itching, visual disturbance and dryness.  Respiratory: Negative for cough, hemoptysis, shortness of breath and difficulty breathing.   Cardiovascular: Positive for swelling in legs/feet. Negative for chest pain and palpitations.  Gastrointestinal: Positive for constipation.  Negative for abdominal pain, blood in stool and diarrhea.  Endocrine: Negative for increased urination.  Genitourinary: Negative for painful urination.  Musculoskeletal: Positive for arthralgias, joint pain, joint swelling, myalgias, muscle weakness, morning stiffness, muscle tenderness and myalgias.  Skin: Negative for color change, rash and redness.  Allergic/Immunologic: Negative for susceptible to infections.  Neurological: Negative for dizziness, numbness, headaches, memory loss and weakness.  Hematological: Negative for swollen glands.  Psychiatric/Behavioral: Positive for sleep disturbance. Negative for confusion.    PMFS History:  Patient Active Problem List   Diagnosis Date Noted  . Anaphylactic shock due to adverse food reaction 02/01/2020  . Moderate persistent asthma without complication 24/58/0998  . Complication of anesthesia   . Fibromyalgia 02/14/2018  . History of gastroesophageal reflux (GERD) 02/14/2018  . History of asthma 02/14/2018  . Rheumatoid arthritis involving multiple sites with positive rheumatoid factor ([place]) 11/05/2016  . High risk medication use 11/05/2016  . LPRD (laryngopharyngeal reflux disease) 09/20/2015  . Seasonal and perennial allergic rhinoconjunctivitis 09/20/2015  . Acute sinusitis 07/29/2015  . S/P total hysterectomy 05/25/2015    Past Medical History:  Diagnosis Date  . Anemia   . Asthma   . Complication of anesthesia   . Eczema   . Esophageal dysphagia   . GERD (gastroesophageal reflux disease)   . PONV (postoperative nausea and vomiting)   . Rheumatoid arthritis ([place])     Family History  Problem Relation Age of Onset  . Asthma Mother   . Eczema  Mother   . Sarcoidosis Mother   . Hypertension Mother   . Cancer Father   . Hypotension Father   . Congestive Heart Failure Father   . Colon cancer Father   . Multiple myeloma Father   . Asthma Sister   . GER disease Sister   . Allergic rhinitis Neg Hx   . Angioedema Neg Hx   .  Immunodeficiency Neg Hx   . Urticaria Neg Hx   . Esophageal cancer Neg Hx   . Rectal cancer Neg Hx   . Stomach cancer Neg Hx    Past Surgical History:  Procedure Laterality Date  . ABDOMINAL HYSTERECTOMY     partial age 61  . BILATERAL SALPINGECTOMY Bilateral 05/25/2015   Procedure: BILATERAL SALPINGECTOMY;  Surgeon: Servando Salina, MD;  Location: [facility];  Service: Gynecology;  Laterality: Bilateral;  . CARPAL TUNNEL RELEASE Left 01/05/2020   Procedure: LEFT CARPAL TUNNEL RELEASE;  Surgeon: Daryll Brod, MD;  Location: [facility];  Service: Orthopedics;  Laterality: Left;  FOREARM BIER BLOCK  . COLONOSCOPY    . OVARIAN CYST REMOVAL Right 05/25/2015   Procedure: OVARIAN CYSTECTOMY;  Surgeon: Servando Salina, MD;  Location: [facility];  Service: Gynecology;  Laterality: Right;  . TONSILLECTOMY    . TUBAL LIGATION     Social History   Social History Narrative  . Not on file   Immunization History  Administered Date(s) Administered  . Influenza Inj Mdck Quad Pf 10/19/2017  . Influenza Split 11/25/2014  . Influenza, Quadrivalent, Recombinant, Inj, Pf 08/26/2019  . Influenza, Seasonal, Injecte, Preservative Fre 09/20/2015  . Influenza,inj,Quad PF,6+ Mos 08/04/2018  . Influenza-Unspecified 07/18/2017  . PFIZER(Purple Top)SARS-COV-2 Vaccination 11/28/2019, 12/19/2019, 06/23/2020     Objective: Vital Signs: BP 135/88 (BP Location: Left Arm, Patient Position: Sitting, Cuff Size: Normal)   Pulse 80   Ht 5' 1.25" (1.556 m)   Wt 189 lb 9.6 oz (86 kg)   LMP 05/19/2015   BMI 35.53 kg/m    Physical Exam Vitals and nursing note reviewed.  Constitutional:      Appearance: She is well-developed.  HENT:     Head: Normocephalic and atraumatic.  Eyes:     Conjunctiva/sclera: Conjunctivae normal.  Pulmonary:     Effort: Pulmonary effort is normal.  Abdominal:     Palpations: Abdomen is soft.  Musculoskeletal:     Cervical back: Normal range of motion.  Skin:     General: Skin is warm and dry.     Capillary Refill: Capillary refill takes less than 2 seconds.  Neurological:     Mental Status: She is alert and oriented to person, place, and time.  Psychiatric:        Behavior: Behavior normal.      Musculoskeletal Exam: C-spine, thoracic spine, and lumbar spine good ROM.  Painful and limited ROM of both shoulder joints. Tender rheumatoid nodules present on the extensor surface of both elbows.  Tenderness and synovitis of both wrist joints, R>L.  Tenderness and synovitis of right 2nd, 3rd, and 4th MCP joints.  Hip joints good ROM with no discomfort.  Knee joints good ROM with no warmth or effusion.  Tenderness and inflammation of both ankle joints.  Tenderness and synovitis of the right 1st MTP joint.   CDAI Exam: CDAI Score: 15.5  Patient Global: 9 mm; Provider Global: 6 mm Swollen: 8 ; Tender: 12  Joint Exam 11/28/2020      Right  Left  Glenohumeral   Tender  Tender  Elbow   Tender   Tender  Wrist  Swollen Tender  Swollen Tender  MCP 2  Swollen Tender     MCP 3  Swollen Tender     MCP 4  Swollen Tender     Ankle  Swollen Tender  Swollen Tender  MTP 1  Swollen Tender        Investigation: No additional findings.  Imaging: No results found.  Recent Labs: Lab Results  Component Value Date   WBC 4.2 11/02/2020   HGB 12.3 11/02/2020   PLT 221 11/02/2020   NA 140 11/02/2020   K 3.9 11/02/2020   CL 108 11/02/2020   CO2 26 11/02/2020   GLUCOSE 126 (H) 11/02/2020   BUN 10 11/02/2020   CREATININE 0.72 11/02/2020   BILITOT 0.3 11/02/2020   ALKPHOS 92 11/02/2020   AST 18 11/02/2020   ALT 18 11/02/2020   PROT 7.1 11/02/2020   ALBUMIN 3.7 11/02/2020   CALCIUM 8.7 (L) 11/02/2020   GFRAA 126 10/13/2020   QFTBGOLDPLUS NEGATIVE 10/13/2020    Speciality Comments: PLQ Eye Exam: 10/26/2020  WNL at [facility] Follow up in 1 year  Procedures:  No procedures performed Allergies: Aspirin, Augmentin [amoxicillin-pot clavulanate], and  Peanut-containing drug products   Assessment / Plan:     Visit Diagnoses: Rheumatoid arthritis involving multiple sites with positive rheumatoid factor (HCC) - +RF, +CCP, Erosive rheumatoid arthritis: She presents today with pain and inflammation in multiple joints.  She has tenderness and painful range of motion of both shoulders, tenderness and synovitis of both wrist joints, right second, third, and fourth MCP joints, and bilateral ankle joints.  She has been holding methotrexate for 3 weeks due to recently undergoing treatment for H. pylori infection.   She will be having a stool test for H. pylori in 6 to 8 weeks, so she will require clearance by Dr. Truddie Crumble prior to restarting methotrexate.  At the patient's last office at this visit on 10/12/2020 the plan was to add on Orencia as triple therapy.  She has been waiting on approval through her insurance, but she will be unable to initiate orencia until she has been cleared by GI.  A prescription for prednisone taper starting at 20 mg tapering by 5 mg every week was sent to the pharmacy.  She will continue on Plaquenil as prescribed.  She will follow-up in the office in 8 weeks.  High risk medication use - She is currently taking plaquenil 200 mg 1 tablet by mouth twice daily.  She is currently holding Methotrexate due to recent H pylori infection.  She will have to receive clearance from Dr. Truddie Crumble prior to resuming MTX.   Awaiting approval for Orencia through her insurance-will need clearance by Dr. Truddie Crumble prior to initiating orencia.   Fibromyalgia: She experiences generalized myalgias and muscle tenderness due to underlying fibromyalgia.  Positive ANA (antinuclear antibody): 07/13/20: ANA is positive-low, nonspecific titer.RNP remains positive.  No clinical features of systemic lupus.  Anti-RNP antibodies present: No clinical features of autoimmune disease at this time.  No sclerodactyly or nailbed capillary changes noted.  Osteopenia of spine:  DEXA on 08/24/2019: AP spine BMD 0.090 with T score -1.3.  Due for updated DEXA in December 2022.  History of Helicobacter pylori infection: Recently treated for H. pylori associated with chronic active gastritis.  She will be having a stool test for H. pylori in 6 to 8 weeks.  She will require clearance by Dr. Truddie Crumble prior to resuming methotrexate  and prior to initiating Orencia. She continues to have some symptoms of reflux and was advised to increase the frequency of Pepcid and continue on Dexilant.  Other medical conditions are listed as follows:   History of asthma  History of depression  Trigger middle finger of right hand: Resolved   History of gastroesophageal reflux (GERD): She is taking pepcid as recommended by her GI.    Abnormal SPEP: Referred to hematology. Evaluated by Dr. Chryl Heck on 11/16/20.  Orders: No orders of the defined types were placed in this encounter.  Meds ordered this encounter  Medications  . predniSONE (DELTASONE) 5 MG tablet    Sig: Take 4 tablets by mouth once daily 1wk, 3 tablets by mouth daily x1wk, 2 tablets by mouth daily x1wk, 1 tablet by mouth daily x1wk    Dispense:  70 tablet    Refill:  0    Follow-Up Instructions: Return in about 8 weeks (around 01/23/2021) for Rheumatoid arthritis, Fibromyalgia.   Ofilia Neas, PA-C  Note - This record has been created using Dragon software.  Chart creation errors have been sought, but may not always  have been located. Such creation errors do not reflect on  the standard of medical care.

## 2020-11-28 NOTE — Telephone Encounter (Signed)
Patient calling due to extreme pain, trouble walking due to pain, and hard time lifting arm up over head. Patient having trouble sleeping due to pain. Patient finished antibiotics one week ago, but has to prvide a stool sample at so many weeks before she can be cleared to restart MTX. Patient requesting rx for [facility] [address]. Please call to advise.

## 2020-12-07 ENCOUNTER — Ambulatory Visit (INDEPENDENT_AMBULATORY_CARE_PROVIDER_SITE_OTHER): Payer: 59

## 2020-12-07 DIAGNOSIS — J4540 Moderate persistent asthma, uncomplicated: Secondary | ICD-10-CM

## 2020-12-12 ENCOUNTER — Other Ambulatory Visit: Payer: Self-pay | Admitting: Allergy & Immunology

## 2020-12-21 ENCOUNTER — Ambulatory Visit (INDEPENDENT_AMBULATORY_CARE_PROVIDER_SITE_OTHER): Payer: 59

## 2020-12-21 ENCOUNTER — Other Ambulatory Visit: Payer: Self-pay | Admitting: Physician Assistant

## 2020-12-21 DIAGNOSIS — J4540 Moderate persistent asthma, uncomplicated: Secondary | ICD-10-CM

## 2020-12-22 NOTE — Telephone Encounter (Signed)
Left VM with patient to f/u if she has GI f/u scheduled for repeat stool sample. She will need repeat stool samples after tx for H. pylori prior to starting [place]. Left message requesting return call. Will f/u next week  Knox Saliva, PharmD, MPH Clinical Pharmacist (Rheumatology and Pulmonology)

## 2021-01-04 ENCOUNTER — Ambulatory Visit (INDEPENDENT_AMBULATORY_CARE_PROVIDER_SITE_OTHER): Payer: 59 | Admitting: Allergy

## 2021-01-04 ENCOUNTER — Ambulatory Visit: Payer: Self-pay

## 2021-01-04 ENCOUNTER — Other Ambulatory Visit: Payer: Self-pay

## 2021-01-04 DIAGNOSIS — J4540 Moderate persistent asthma, uncomplicated: Secondary | ICD-10-CM | POA: Diagnosis not present

## 2021-01-07 ENCOUNTER — Other Ambulatory Visit: Payer: Self-pay | Admitting: Rheumatology

## 2021-01-08 NOTE — Telephone Encounter (Signed)
Last Visit: 11/28/2020 Next Visit: 02/08/2021 Labs: 11/02/2020, Glucose 126, Calcium 8.7, IFE abnormal Eye exam: 10/26/2020  Current Dose per office note 11/28/2020, plaquenil 200 mg 1 tablet by mouth twice daily [id number] arthritis involving multiple sites with positive rheumatoid factor    Last Fill: 10/13/2020  Okay to refill Plaquenil?

## 2021-01-11 ENCOUNTER — Telehealth: Payer: Self-pay

## 2021-01-11 NOTE — Telephone Encounter (Signed)
Patient called requesting a return call regarding her Orencia medication.  Patient states she was approved and then due to her infection wasn't able to start the medication.  Patient states her H pylori lab was normal.

## 2021-01-12 ENCOUNTER — Telehealth: Payer: Self-pay | Admitting: Physician Assistant

## 2021-01-12 ENCOUNTER — Other Ambulatory Visit: Payer: Self-pay

## 2021-01-12 DIAGNOSIS — Z79899 Other long term (current) drug therapy: Secondary | ICD-10-CM

## 2021-01-12 MED ORDER — PREDNISONE 5 MG PO TABS: ORAL_TABLET | ORAL | 0 refills | Status: DC

## 2021-01-12 NOTE — Telephone Encounter (Signed)
Patient to stop by clinic today 01/12/21 for repeat CBC and CMP labs prior to starting Orencia. H. Pylori repeat stool sample was negative on 12/21/20.

## 2021-01-12 NOTE — Telephone Encounter (Signed)
Spoke with patient regarding H. Pylori repeat testing that was negative on 12/21/20 indicating infection was treated.  Patient advised that she will need updated labs to start [place]. She will plan to stop by today for labs. Standing order for CBC and CMP already in place  Knox Saliva, PharmD, MPH Clinical Pharmacist (Rheumatology and Pulmonology)

## 2021-01-12 NOTE — Telephone Encounter (Signed)
The patient presented today to update routine lab work.  While in the office she requested to speak to me.  She is currently experiencing pain and swelling in multiple joints.  She has had difficulty with ADLs due to severity of pain.  She has difficulty walking prolonged distances due to the pain and swelling she is experiencing in her knees, ankles, and both feet.  She requested a handicap placard to use during flares, which was provided.  According to the patient she was cleared to restart on Orencia and plans to restart pending lab results.  A prednisone taper starting at 20 mg tapering by 5 mg every 4 days was sent to the pharmacy to alleviate the flare she is experiencing currently. All questions were addressed.   Julie Sams, PA-C

## 2021-01-12 NOTE — Telephone Encounter (Signed)
Prednisone taper per your recommendation. Please review and send prescription to the pharmacy. Thanks!

## 2021-01-13 LAB — COMPLETE METABOLIC PANEL WITH GFR
AG Ratio: 1.3 (calc) (ref 1.0–2.5)
ALT: 16 U/L (ref 6–29)
AST: 16 U/L (ref 10–35)
Albumin: 4.3 g/dL (ref 3.6–5.1)
Alkaline phosphatase (APISO): 78 U/L (ref 37–153)
BUN: 14 mg/dL (ref 7–25)
CO2: 32 mmol/L (ref 20–32)
Calcium: 9.6 mg/dL (ref 8.6–10.4)
Chloride: 102 mmol/L (ref 98–110)
Creat: 0.66 mg/dL (ref 0.50–1.05)
GFR, Est African American: 118 mL/min/{1.73_m2} (ref >=60)
GFR, Est Non African American: 102 mL/min/{1.73_m2} (ref >=60)
Globulin: 3.2 g/dL (calc) (ref 1.9–3.7)
Glucose, Bld: 90 mg/dL (ref 65–99)
Potassium: 4.7 mmol/L (ref 3.5–5.3)
Sodium: 140 mmol/L (ref 135–146)
Total Bilirubin: 0.3 mg/dL (ref 0.2–1.2)
Total Protein: 7.5 g/dL (ref 6.1–8.1)

## 2021-01-13 LAB — CBC WITH DIFFERENTIAL/PLATELET
Absolute Monocytes: 624 cells/uL (ref 200–950)
Basophils Absolute: 48 cells/uL (ref 0–200)
Basophils Relative: 0.8 %
Eosinophils Absolute: 138 cells/uL (ref 15–500)
Eosinophils Relative: 2.3 %
HCT: 40.9 % (ref 35.0–45.0)
Hemoglobin: 13.5 g/dL (ref 11.7–15.5)
Lymphs Abs: 2196 cells/uL (ref 850–3900)
MCH: 27.5 pg (ref 27.0–33.0)
MCHC: 33 g/dL (ref 32.0–36.0)
MCV: 83.3 fL (ref 80.0–100.0)
MPV: 11.3 fL (ref 7.5–12.5)
Monocytes Relative: 10.4 %
Neutro Abs: 2994 cells/uL (ref 1500–7800)
Neutrophils Relative %: 49.9 %
Platelets: 297 10*3/uL (ref 140–400)
RBC: 4.91 10*6/uL (ref 3.80–5.10)
RDW: 11.8 % (ref 11.0–15.0)
Total Lymphocyte: 36.6 %
WBC: 6 10*3/uL (ref 3.8–10.8)

## 2021-01-13 NOTE — Telephone Encounter (Addendum)
Paitent scheduled for Orencia new start on 01/30/21 @9am . Will sign up for copay card using email that patient confirmed. She has been advised to retain this email  [place] card information:  BIN: [id number] Group: [id number] PCN: LOYALTY ID: [id number]  Knox Saliva, PharmD, MPH Clinical Pharmacist (Rheumatology and Pulmonology)

## 2021-01-13 NOTE — Telephone Encounter (Signed)
Paitent scheduled for Orencia new start on 01/23/21 @9am .  Orencia copay card information:  BIN: [id number] Group: [id number] PCN: LOYALTY ID: [id number]  Knox Saliva, PharmD, MPH Clinical Pharmacist (Rheumatology and Pulmonology)

## 2021-01-13 NOTE — Progress Notes (Signed)
CBC and CMP are normal.

## 2021-01-18 ENCOUNTER — Ambulatory Visit: Payer: Self-pay

## 2021-01-23 ENCOUNTER — Ambulatory Visit: Payer: 59 | Admitting: Pharmacist

## 2021-01-23 NOTE — Telephone Encounter (Signed)
Patient r/s Orencia new start to 01/30/21.

## 2021-01-25 NOTE — Progress Notes (Deleted)
Office Visit Note  Patient: Julie [name]             Date of Birth: September 06, 1966           MRN: [id number]             PCP: Suella Broad, FNP Referring: Suella Broad,* Visit Date: 02/08/2021 Occupation: @GUAROCC @  Subjective:  No chief complaint on file.   History of Present Illness: Julie LANECIA SLIVA is a 55 y.o. female ***   Activities of Daily Living:  Patient reports morning stiffness for *** {minute/hour:19697}.   Patient {ACTIONS;DENIES/REPORTS:21021675::"Denies"} nocturnal pain.  Difficulty dressing/grooming: {ACTIONS;DENIES/REPORTS:21021675::"Denies"} Difficulty climbing stairs: {ACTIONS;DENIES/REPORTS:21021675::"Denies"} Difficulty getting out of chair: {ACTIONS;DENIES/REPORTS:21021675::"Denies"} Difficulty using hands for taps, buttons, cutlery, and/or writing: {ACTIONS;DENIES/REPORTS:21021675::"Denies"}  No Rheumatology ROS completed.   PMFS History:  Patient Active Problem List   Diagnosis Date Noted  . Anaphylactic shock due to adverse food reaction 02/01/2020  . Moderate persistent asthma without complication 91/47/8295  . Complication of anesthesia   . Fibromyalgia 02/14/2018  . History of gastroesophageal reflux (GERD) 02/14/2018  . History of asthma 02/14/2018  . Rheumatoid arthritis involving multiple sites with positive rheumatoid factor ([employer]) 11/05/2016  . High risk medication use 11/05/2016  . LPRD (laryngopharyngeal reflux disease) 09/20/2015  . Seasonal and perennial allergic rhinoconjunctivitis 09/20/2015  . Acute sinusitis 07/29/2015  . S/P total hysterectomy 05/25/2015    Past Medical History:  Diagnosis Date  . Anemia   . Asthma   . Complication of anesthesia   . Eczema   . Esophageal dysphagia   . GERD (gastroesophageal reflux disease)   . PONV (postoperative nausea and vomiting)   . Rheumatoid arthritis ([place])     Family History  Problem Relation Age of Onset  . Asthma Mother   . Eczema Mother   .  Sarcoidosis Mother   . Hypertension Mother   . Cancer Father   . Hypotension Father   . Congestive Heart Failure Father   . Colon cancer Father   . Multiple myeloma Father   . Asthma Sister   . GER disease Sister   . Allergic rhinitis Neg Hx   . Angioedema Neg Hx   . Immunodeficiency Neg Hx   . Urticaria Neg Hx   . Esophageal cancer Neg Hx   . Rectal cancer Neg Hx   . Stomach cancer Neg Hx    Past Surgical History:  Procedure Laterality Date  . ABDOMINAL HYSTERECTOMY     partial age 67  . BILATERAL SALPINGECTOMY Bilateral 05/25/2015   Procedure: BILATERAL SALPINGECTOMY;  Surgeon: Servando Salina, MD;  Location: [facility];  Service: Gynecology;  Laterality: Bilateral;  . CARPAL TUNNEL RELEASE Left 01/05/2020   Procedure: LEFT CARPAL TUNNEL RELEASE;  Surgeon: Daryll Brod, MD;  Location: [facility];  Service: Orthopedics;  Laterality: Left;  FOREARM BIER BLOCK  . COLONOSCOPY    . OVARIAN CYST REMOVAL Right 05/25/2015   Procedure: OVARIAN CYSTECTOMY;  Surgeon: Servando Salina, MD;  Location: [facility];  Service: Gynecology;  Laterality: Right;  . TONSILLECTOMY    . TUBAL LIGATION     Social History   Social History Narrative  . Not on file   Immunization History  Administered Date(s) Administered  . Influenza Inj Mdck Quad Pf 10/19/2017  . Influenza Split 11/25/2014  . Influenza, Quadrivalent, Recombinant, Inj, Pf 08/26/2019  . Influenza, Seasonal, Injecte, Preservative Fre 09/20/2015  . Influenza,inj,Quad PF,6+ Mos 08/04/2018  . Influenza-Unspecified 07/18/2017  . PFIZER(Purple Top)SARS-COV-2 Vaccination 11/28/2019, 12/19/2019,  06/23/2020     Objective: Vital Signs: LMP 05/19/2015    Physical Exam   Musculoskeletal Exam: ***  CDAI Exam: CDAI Score: -- Patient Global: --; Provider Global: -- Swollen: --; Tender: -- Joint Exam 02/08/2021   No joint exam has been documented for this visit   There is currently no information documented on the  homunculus. Go to the Rheumatology activity and complete the homunculus joint exam.  Investigation: No additional findings.  Imaging: No results found.  Recent Labs: Lab Results  Component Value Date   WBC 6.0 01/12/2021   HGB 13.5 01/12/2021   PLT 297 01/12/2021   NA 140 01/12/2021   K 4.7 01/12/2021   CL 102 01/12/2021   CO2 32 01/12/2021   GLUCOSE 90 01/12/2021   BUN 14 01/12/2021   CREATININE 0.66 01/12/2021   BILITOT 0.3 01/12/2021   ALKPHOS 92 11/02/2020   AST 16 01/12/2021   ALT 16 01/12/2021   PROT 7.5 01/12/2021   ALBUMIN 3.7 11/02/2020   CALCIUM 9.6 01/12/2021   GFRAA 118 01/12/2021   QFTBGOLDPLUS NEGATIVE 10/13/2020    Speciality Comments: PLQ Eye Exam: 10/26/2020  WNL at [facility] Follow up in 1 year  Procedures:  No procedures performed Allergies: Aspirin, Augmentin [amoxicillin-pot clavulanate], and Peanut-containing drug products   Assessment / Plan:     Visit Diagnoses: No diagnosis found.  Orders: No orders of the defined types were placed in this encounter.  No orders of the defined types were placed in this encounter.   Face-to-face time spent with patient was *** minutes. Greater than 50% of time was spent in counseling and coordination of care.  Follow-Up Instructions: No follow-ups on file.   Earnestine Mealing, CMA  Note - This record has been created using Editor, commissioning.  Chart creation errors have been sought, but may not always  have been located. Such creation errors do not reflect on  the standard of medical care.

## 2021-01-26 ENCOUNTER — Ambulatory Visit: Payer: Self-pay

## 2021-01-30 ENCOUNTER — Ambulatory Visit: Payer: 59 | Admitting: Pharmacist

## 2021-01-30 ENCOUNTER — Encounter: Payer: Self-pay | Admitting: Rheumatology

## 2021-01-30 ENCOUNTER — Other Ambulatory Visit: Payer: Self-pay

## 2021-01-30 ENCOUNTER — Other Ambulatory Visit (HOSPITAL_COMMUNITY): Payer: Self-pay

## 2021-01-30 VITALS — BP 122/85 | HR 76

## 2021-01-30 DIAGNOSIS — M0579 Rheumatoid arthritis with rheumatoid factor of multiple sites without organ or systems involvement: Secondary | ICD-10-CM

## 2021-01-30 MED ORDER — PREDNISONE 5 MG PO TABS: ORAL_TABLET | ORAL | 0 refills | Status: DC

## 2021-01-30 MED ORDER — ORENCIA CLICKJECT 125 MG/ML ~~LOC~~ SOAJ: 125.0000 mg | SUBCUTANEOUS | 0 refills | Status: DC

## 2021-01-30 MED ORDER — "BD TB SYRINGE 27G X 1/2"" 1 ML MISC": 3 refills | Status: DC

## 2021-01-30 MED ORDER — METHOTREXATE SODIUM CHEMO INJECTION 50 MG/2ML: 20.0000 mg | INTRAMUSCULAR | 0 refills | Status: DC

## 2021-01-30 MED ORDER — FOLIC ACID 1 MG PO TABS: 1.0000 mg | ORAL_TABLET | Freq: Two times a day (BID) | ORAL | 3 refills | Status: DC

## 2021-01-30 NOTE — Progress Notes (Signed)
Pharmacy Note  Subjective:   Julie [name] presents to clinic today to receive first dose of Orencia Clickject for rheumatoid arthritis.  She is a bit anxious to start an injectable medication but has been off of methotrexate for several months and has been flaring. States she is in pain today. Was last seen by Hazel Sams, PA-C on 11/28/20. She does not have history of COPD - does have history of asthma  She has been off of methotrexate for several months d/t recurrent antibiotics - antibiotics for sinus infection and then treatment for H. Pylori. From her understanding, she was replacing methotrexate with Orencia. We discussed that Maureen Chatters is an escalation and add-on therapy for now.  Prednisone was prescribed on 01/12/21 by Hazel Sams, PA-C, and she finished the course on 01/27/21 but states she is having pain since completing therapy. She continues on hydroxychloroquine 200mg  twice daily Monday-Friday  Patient running a fever or have signs/symptoms of infection? No  Patient currently on antibiotics for the treatment of infection? No  Patient have any upcoming invasive procedures/surgeries? No  Objective: CMP     Component Value Date/Time   NA 140 01/12/2021 1412   NA 140 10/02/2016 1602   K 4.7 01/12/2021 1412   CL 102 01/12/2021 1412   CO2 32 01/12/2021 1412   GLUCOSE 90 01/12/2021 1412   BUN 14 01/12/2021 1412   BUN 8 10/02/2016 1602   CREATININE 0.66 01/12/2021 1412   CALCIUM 9.6 01/12/2021 1412   PROT 7.5 01/12/2021 1412   PROT 7.1 10/02/2016 1602   ALBUMIN 3.7 11/02/2020 0954   ALBUMIN 4.1 10/02/2016 1602   AST 16 01/12/2021 1412   AST 18 11/02/2020 0954   ALT 16 01/12/2021 1412   ALT 18 11/02/2020 0954   ALKPHOS 92 11/02/2020 0954   BILITOT 0.3 01/12/2021 1412   BILITOT 0.3 11/02/2020 0954   GFRNONAA 102 01/12/2021 1412   GFRAA 118 01/12/2021 1412    CBC    Component Value Date/Time   WBC 6.0 01/12/2021 1412   RBC 4.91 01/12/2021 1412   HGB 13.5 01/12/2021  1412   HGB 13.4 10/02/2016 1602   HCT 40.9 01/12/2021 1412   HCT 40.0 10/02/2016 1602   PLT 297 01/12/2021 1412   PLT 266 10/02/2016 1602   MCV 83.3 01/12/2021 1412   MCV 85 10/02/2016 1602   MCH 27.5 01/12/2021 1412   MCHC 33.0 01/12/2021 1412   RDW 11.8 01/12/2021 1412   RDW 12.5 10/02/2016 1602   LYMPHSABS 2,196 01/12/2021 1412   LYMPHSABS 1.5 10/02/2016 1602   MONOABS 0.4 11/02/2020 0954   EOSABS 138 01/12/2021 1412   EOSABS 0.1 10/02/2016 1602   BASOSABS 48 01/12/2021 1412   BASOSABS 0.0 10/02/2016 1602    Baseline Immunosuppressant Therapy Labs TB GOLD Quantiferon TB Gold Latest Ref Rng & Units 10/13/2020  Quantiferon TB Gold Plus NEGATIVE NEGATIVE   Hepatitis Panel Hepatitis Latest Ref Rng & Units 10/13/2020  Hep B Surface Ag NON-REACTI NON-REACTIVE  Hep B IgM NON-REACTI NON-REACTIVE  Hep C Ab NON-REACTI NON-REACTIVE  Hep C Ab NON-REACTI NON-REACTIVE   HIV Lab Results  Component Value Date   HIV NON-REACTIVE 10/13/2020   Immunoglobulins Immunoglobulin Electrophoresis Latest Ref Rng & Units 11/02/2020  IgG 586 - 1,602 mg/dL 1,540  IgM 26 - 217 mg/dL 107   SPEP Serum Protein Electrophoresis Latest Ref Rng & Units 01/12/2021  Total Protein 6.1 - 8.1 g/dL 7.5  Albumin 2.9 - 4.4 g/dL -  Alpha-1 0.0 - 0.4  g/dL -  Alpha-2 0.4 - 1.0 g/dL -  Beta Globulin 0.7 - 1.3 g/dL -  Beta 2 0.2 - 0.5 g/dL -  Gamma Globulin 0.4 - 1.8 g/dL -   Chest x-ray: 10/08/2018 - no active cardiopulmonary disease  Assessment/Plan:  Counseled patient that Orencia is a selective T-cell costimulation blocker.  Counseled patient on purpose, proper use, and adverse effects of Orencia. The most common adverse effects are increased risk of infections, headache, and injection site reactions or infusion reactions. Reviewed risk of GI perforation which is higher in patients with diverticulitis and diabetes. There is the possibility of an increased risk of malignancy but it is not well understood if  this increased risk is due to the medication or the disease state.  Counseled patient that Maureen Chatters should be held prior to scheduled surgery.  Counseled patient to avoid live vaccines while on Orencia.  Recommend annual influenza, Pneumovax 23, Prevnar 13, and Shingrix as indicated.   She has received 3 COVID19 Therapist, music) vaccines and states she has received 2nd booster dose. She did not have COVID19 card on hand today but she has been advised to bring the medication with her  Reviewed the importance of regular labs while on Orencia. Standing orders for CBC and CMP remain in place.  Provided patient with medication education material and answered all questions.  Patient already consented to [facility] at last OV.  Reviewed storage information for Orencia.  Demonstrated proper injection technique with Orencia demo device  Patient able to demonstrate proper injection technique using the teach back method.  Patient self injected in the right thigh with:  Sample Medication: Orencia Clickject 125mg /mL NDC: [address]-02 Lot: [id number] Expiration: 12/2021  Patient tolerated well.  Observed for 30 mins in office for adverse reaction and none noted.   Orencia approved through insurance.  Prescription sent to [facility] with copay card information. Patient provided with copay card information.  Patient will restart methotrexate at 20mg  once weekly and folic acid 2mg  daily today. She is starting omeprazole as directed by her PCP and we will re-assess labs in 1 month at f/u visit. MTX dose reduced d/t this drug-drug interaction but may increase MTX if labs are wnl in 1 month.  She was previously taking MTX 25mg  weekly and dose can be increased depending on this 1 month labs. Prescription for MTX, folic acid, sy[facility]  She will continue hydroxychloroquine 200mg  twice daily Monday to Friday.  [facility] for prednisone taper [facility].  Patient provided with Goodrx for [facility] to fill  omeprazole. She's been advised to have [facility].  All questions encouraged and answered.  Instructed patient to call with any further questions or concerns.  Knox Saliva, PharmD, MPH Clinical Pharmacist (Rheumatology and Pulmonology)  01/30/2021 8:19 AM

## 2021-01-30 NOTE — Patient Instructions (Addendum)
Your next Orencia dose is due on 02/06/21 and weekly thereafter. Restart your methotexate! [facility]  Your prescription will be shipped from [facility]. Their phone number is [phone] Please call the pharmacy to schedule shipment and confirm address tomorrow. They will mail medication to your home.  Your copay should be affordable. If you call the pharmacy and it is not affordable, please double-check that they are billing through your copay card as secondary coverage. That copay card information is: BIN: [id number] Group: [id number] PCN: [id number] ID: [id number]  Labs are due in 1 month then every 3 months.  Please have your omeprazole filled at [facility]. [facility]!  Remember the 5 C's:  COUNTER - leave on the counter at least 30 minutes but up to overnight to bring medication to room temperature. This may help prevent stinging  COLD - place something cold (like an ice gel pack or cold water bottle) on the injection site just before cleansing with alcohol. This may help reduce pain  CLARITIN - use Claritin (generic name is loratadine) for the first two weeks of treatment or the day of, the day before, and the day after injecting. This will help to minimize injection site reactions  CORTISONE CREAM - apply if injection site is irritated and itching  CALL ME - if injection site reaction is bigger than the size of your fist, looks infected, blisters, or if you develop hives  Standing Labs We placed an order today for your standing lab work.   Please have your standing labs drawn in 1 month, then every 3 months  If possible, please have your labs drawn 2 weeks prior to your appointment so that the provider can discuss your results at your appointment.  We have open lab daily Monday through Thursday from 1:30-4:30 PM and Friday from 1:30-4:00 PM at the office of Dr. Bo Merino, [facility].   Please be advised, all patients with office appointments requiring lab work will take precedents over walk-in lab work.  If possible, please come for your lab work on Monday and Friday afternoons, as you may experience shorter wait times. The office is located at [address], [place], [place], [place] [address] No appointment is necessary.   Labs are drawn by [facility]. Please bring your co-pay at the time of your lab draw.  You may receive a bill from [facility] for your lab work.  If you wish to have your labs drawn at another location, please call the office 24 hours in advance to send orders.  If you have any questions regarding directions or hours of operation,  please call [phone].   As a reminder, please drink plenty of water prior to coming for your lab work. Thanks!   Abatacept solution for injection (subcutaneous or intravenous use) What is this medicine? ABATACEPT (a ba TA sept) is used to treat moderate to severe active rheumatoid arthritis or psoriatic arthritis in adults. This medicine is also used to treat juvenile idiopathic arthritis. This medicine may be used for other purposes; ask your health care provider or pharmacist if you have questions. COMMON BRAND NAME(S): Orencia What should I tell my health care provider before I take this medicine? They need to know if you have any of these conditions:  cancer  diabetes  hepatitis B or history of hepatitis B infection  immune system problems  infection or history of infection (especially a virus infection such as chickenpox,  cold sores, or herpes)  lung or breathing problems, like chronic obstructive pulmonary disease (COPD)  recently received or scheduled to receive a vaccination  scheduled to have surgery  tuberculosis, a positive skin test for tuberculosis, or have recently been in close contact with someone who has tuberculosis  an unusual or allergic reaction to abatacept, other medicines,  foods, dyes, or preservatives  pregnant or trying to get pregnant  breast-feeding How should I use this medicine? This medicine is for infusion into a vein or for injection under the skin. Infusions are given by a health care professional in a hospital or clinic setting. If you are to give your own medicine at home, you will be taught how to prepare and give this medicine under the skin. Use exactly as directed. Take your medicine at regular intervals. Do not take your medicine more often than directed. It is important that you put your used needles and syringes in a special sharps container. Do not put them in a trash can. If you do not have a sharps container, call your pharmacist or health care provider to get one. Talk to your pediatrician regarding the use of this medicine in children. While infusions in a clinic may be prescribed for children as young as 2 years for selected conditions, precautions do apply. Overdosage: If you think you have taken too much of this medicine contact a poison control center or emergency room at once. NOTE: This medicine is only for you. Do not share this medicine with others. What if I miss a dose? This medicine is used once a week if given by injection under the skin. If you miss a dose, take it as soon as you can. If it is almost time for your next dose, take only that dose. Do not take double or extra doses. If you are to be given an infusion of this medicine, it is important not to miss your dose. Doses are usually every 4 weeks. Call your doctor or health care professional if you are unable to keep an appointment. What may interact with this medicine? Do not take this medicine with any of the following medications:  live vaccines This medicine may also interact with the following medications:  anakinra  baricitinib  canakinumab  medicines that lower your chance of fighting an infection  rituximab  TNF blockers such as adalimumab, certolizumab,  etanercept, golimumab, infliximab  tocilizumab  tofacitinib  upadacitinib  ustekinumab This list may not describe all possible interactions. Give your health care provider a list of all the medicines, herbs, non-prescription drugs, or dietary supplements you use. Also tell them if you smoke, drink alcohol, or use illegal drugs. Some items may interact with your medicine. What should I watch for while using this medicine? Visit your doctor for regular checks on your progress. Tell your doctor or health care professional if your symptoms do not start to get better or if they get worse. You will be tested for tuberculosis (TB) before you start this medicine. If your doctor prescribed any medicine for TB, you should start taking the TB medicine before starting this medicine. Make sure to finish the full course of TB medicine. This medicine may increase your risk of getting an infection. Call your doctor or health care professional if you get fever, chills, or sore throat, or other symptoms of a cold or flu. Do not treat yourself. Try to avoid being around people who are sick. If you have diabetes and are getting this medicine  in a vein, the infusion can give false high blood sugar readings on the day of your dose. This may happen if you use certain types of blood glucose tests. Your health care provider may tell you to use a different way to monitor your blood sugar levels. What side effects may I notice from receiving this medicine? Side effects that you should report to your doctor or health care professional as soon as possible:  allergic reactions like skin rash, itching or hives, swelling of the face, lips, or tongue  breathing problems  chest pain  dizziness  signs and symptoms of infection like fever; chills; cough; sore throat; pain or trouble passing urine  unusually weak or tired Side effects that usually do not require medical attention (report to your doctor or health care  professional if they continue or are bothersome):  diarrhea  headache  nausea  pain, redness, or irritation at site where injected  stomach pain or upset This list may not describe all possible side effects. Call your doctor for medical advice about side effects. You may report side effects to FDA at 1-800-FDA-1088. Where should I keep my medicine? Infusions will be given in a hospital or clinic and will not be stored at home. Storage for syringes and autoinjectors stored at home: Keep out of the reach of children. Store in a refrigerator between 2 and 8 degrees C (36 and 46 degrees F). Keep this medicine in the original container. Protect from light. Do not freeze. Do not shake. Throw away any unused medicine after the expiration date. NOTE: This sheet is a summary. It may not cover all possible information. If you have questions about this medicine, talk to your doctor, pharmacist, or health care provider.  2021 Elsevier/Gold Standard (2019-03-10 14:01:21)

## 2021-02-01 ENCOUNTER — Ambulatory Visit (INDEPENDENT_AMBULATORY_CARE_PROVIDER_SITE_OTHER): Payer: 59 | Admitting: Allergy

## 2021-02-01 ENCOUNTER — Other Ambulatory Visit: Payer: Self-pay

## 2021-02-01 DIAGNOSIS — J4540 Moderate persistent asthma, uncomplicated: Secondary | ICD-10-CM | POA: Diagnosis not present

## 2021-02-08 ENCOUNTER — Ambulatory Visit: Payer: 59 | Admitting: Rheumatology

## 2021-02-08 DIAGNOSIS — M797 Fibromyalgia: Secondary | ICD-10-CM

## 2021-02-08 DIAGNOSIS — Z8719 Personal history of other diseases of the digestive system: Secondary | ICD-10-CM

## 2021-02-08 DIAGNOSIS — R778 Other specified abnormalities of plasma proteins: Secondary | ICD-10-CM

## 2021-02-08 DIAGNOSIS — Z8659 Personal history of other mental and behavioral disorders: Secondary | ICD-10-CM

## 2021-02-08 DIAGNOSIS — Z8619 Personal history of other infectious and parasitic diseases: Secondary | ICD-10-CM

## 2021-02-08 DIAGNOSIS — M0579 Rheumatoid arthritis with rheumatoid factor of multiple sites without organ or systems involvement: Secondary | ICD-10-CM

## 2021-02-08 DIAGNOSIS — M8588 Other specified disorders of bone density and structure, other site: Secondary | ICD-10-CM

## 2021-02-08 DIAGNOSIS — Z8709 Personal history of other diseases of the respiratory system: Secondary | ICD-10-CM

## 2021-02-08 DIAGNOSIS — R768 Other specified abnormal immunological findings in serum: Secondary | ICD-10-CM

## 2021-02-08 DIAGNOSIS — M65331 Trigger finger, right middle finger: Secondary | ICD-10-CM

## 2021-02-08 DIAGNOSIS — Z79899 Other long term (current) drug therapy: Secondary | ICD-10-CM

## 2021-02-15 ENCOUNTER — Other Ambulatory Visit: Payer: Self-pay

## 2021-02-15 ENCOUNTER — Ambulatory Visit (INDEPENDENT_AMBULATORY_CARE_PROVIDER_SITE_OTHER): Payer: 59

## 2021-02-15 DIAGNOSIS — J4540 Moderate persistent asthma, uncomplicated: Secondary | ICD-10-CM | POA: Diagnosis not present

## 2021-02-27 NOTE — Progress Notes (Deleted)
Office Visit Note  Patient: Julie [name]             Date of Birth: 1966/06/25           MRN: [id number]             PCP: Suella Broad, FNP Referring: Suella Broad,* Visit Date: 02/28/2021 Occupation: @GUAROCC @  Subjective:    History of Present Illness: Julie M Rohlman is a 55 y.o. female with history of seropositive rheumatoid arthritis and fibromyalgia.  Patient is currently on Orencia 125 mg sq injections once weekly, methotrexate 0.8 mL sq injections once weekly, folic acid 2 mg by mouth daily, and Plaquenil 200 mg 1 tablet twice daily.  CBC and CMP drawn on 01/12/2021.  Her next lab work will be due in July and every 3 months to monitor for drug toxicity.  Standing orders for CBC and CMP were placed today. TB Gold negative on 10/13/2020 and will continue to be monitored yearly. PLQ Eye Exam: 10/26/2020 WNL at [facility] Follow up in 1 year  Activities of Daily Living:  Patient reports morning stiffness for *** {minute/hour:19697}.   Patient {ACTIONS;DENIES/REPORTS:21021675::"Denies"} nocturnal pain.  Difficulty dressing/grooming: {ACTIONS;DENIES/REPORTS:21021675::"Denies"} Difficulty climbing stairs: {ACTIONS;DENIES/REPORTS:21021675::"Denies"} Difficulty getting out of chair: {ACTIONS;DENIES/REPORTS:21021675::"Denies"} Difficulty using hands for taps, buttons, cutlery, and/or writing: {ACTIONS;DENIES/REPORTS:21021675::"Denies"}  No Rheumatology ROS completed.   PMFS History:  Patient Active Problem List   Diagnosis Date Noted   Anaphylactic shock due to adverse food reaction 02/01/2020   Moderate persistent asthma without complication 18/56/3149   Complication of anesthesia    Fibromyalgia 02/14/2018   History of gastroesophageal reflux (GERD) 02/14/2018   History of asthma 02/14/2018   Rheumatoid arthritis involving multiple sites with positive rheumatoid factor ([place]) 11/05/2016   High risk medication use 11/05/2016   LPRD (laryngopharyngeal  reflux disease) 09/20/2015   Seasonal and perennial allergic rhinoconjunctivitis 09/20/2015   Acute sinusitis 07/29/2015   S/P total hysterectomy 05/25/2015    Past Medical History:  Diagnosis Date   Anemia    Asthma    Complication of anesthesia    Eczema    Esophageal dysphagia    GERD (gastroesophageal reflux disease)    PONV (postoperative nausea and vomiting)    Rheumatoid arthritis ([place])     Family History  Problem Relation Age of Onset   Asthma Mother    Eczema Mother    Sarcoidosis Mother    Hypertension Mother    Cancer Father    Hypotension Father    Congestive Heart Failure Father    Colon cancer Father    Multiple myeloma Father    Asthma Sister    GER disease Sister    Allergic rhinitis Neg Hx    Angioedema Neg Hx    Immunodeficiency Neg Hx    Urticaria Neg Hx    Esophageal cancer Neg Hx    Rectal cancer Neg Hx    Stomach cancer Neg Hx    Past Surgical History:  Procedure Laterality Date   ABDOMINAL HYSTERECTOMY     partial age 80   BILATERAL SALPINGECTOMY Bilateral 05/25/2015   Procedure: BILATERAL SALPINGECTOMY;  Surgeon: Servando Salina, MD;  Location: [facility];  Service: Gynecology;  Laterality: Bilateral;   CARPAL TUNNEL RELEASE Left 01/05/2020   Procedure: LEFT CARPAL TUNNEL RELEASE;  Surgeon: Daryll Brod, MD;  Location: [facility];  Service: Orthopedics;  Laterality: Left;  FOREARM BIER BLOCK   COLONOSCOPY     OVARIAN CYST REMOVAL Right 05/25/2015   Procedure:  OVARIAN CYSTECTOMY;  Surgeon: Servando Salina, MD;  Location: [facility];  Service: Gynecology;  Laterality: Right;   TONSILLECTOMY     TUBAL LIGATION     Social History   Social History Narrative   Not on file   Immunization History  Administered Date(s) Administered   Influenza Inj Mdck Quad Pf 10/19/2017   Influenza Split 11/25/2014   Influenza, Quadrivalent, Recombinant, Inj, Pf 08/26/2019   Influenza, Seasonal, Injecte, Preservative Fre 09/20/2015    Influenza,inj,Quad PF,6+ Mos 08/04/2018   Influenza-Unspecified 07/18/2017   PFIZER(Purple Top)SARS-COV-2 Vaccination 11/28/2019, 12/19/2019, 06/23/2020     Objective: Vital Signs: LMP 05/19/2015    Physical Exam Vitals and nursing note reviewed.  Constitutional:      Appearance: She is well-developed.  HENT:     Head: Normocephalic and atraumatic.  Eyes:     Conjunctiva/sclera: Conjunctivae normal.  Pulmonary:     Effort: Pulmonary effort is normal.  Abdominal:     Palpations: Abdomen is soft.  Musculoskeletal:     Cervical back: Normal range of motion.  Skin:    General: Skin is warm and dry.     Capillary Refill: Capillary refill takes less than 2 seconds.  Neurological:     Mental Status: She is alert and oriented to person, place, and time.  Psychiatric:        Behavior: Behavior normal.     Musculoskeletal Exam:   CDAI Exam: CDAI Score: -- Patient Global: --; Provider Global: -- Swollen: --; Tender: -- Joint Exam 02/28/2021   No joint exam has been documented for this visit   There is currently no information documented on the homunculus. Go to the Rheumatology activity and complete the homunculus joint exam.  Investigation: No additional findings.  Imaging: No results found.  Recent Labs: Lab Results  Component Value Date   WBC 6.0 01/12/2021   HGB 13.5 01/12/2021   PLT 297 01/12/2021   NA 140 01/12/2021   K 4.7 01/12/2021   CL 102 01/12/2021   CO2 32 01/12/2021   GLUCOSE 90 01/12/2021   BUN 14 01/12/2021   CREATININE 0.66 01/12/2021   BILITOT 0.3 01/12/2021   ALKPHOS 92 11/02/2020   AST 16 01/12/2021   ALT 16 01/12/2021   PROT 7.5 01/12/2021   ALBUMIN 3.7 11/02/2020   CALCIUM 9.6 01/12/2021   GFRAA 118 01/12/2021   QFTBGOLDPLUS NEGATIVE 10/13/2020    Speciality Comments: PLQ Eye Exam: 10/26/2020  WNL at [facility] Follow up in 1 year  Procedures:  No procedures performed Allergies: Aspirin, Augmentin [amoxicillin-pot clavulanate],  and Peanut-containing drug products   Assessment / Plan:     Visit Diagnoses: No diagnosis found.  Orders: No orders of the defined types were placed in this encounter.  No orders of the defined types were placed in this encounter.   Face-to-face time spent with patient was *** minutes. Greater than 50% of time was spent in counseling and coordination of care.  Follow-Up Instructions: No follow-ups on file.   Earnestine Mealing, CMA  Note - This record has been created using Editor, commissioning.  Chart creation errors have been sought, but may not always  have been located. Such creation errors do not reflect on  the standard of medical care.

## 2021-02-28 ENCOUNTER — Ambulatory Visit: Payer: 59 | Admitting: Physician Assistant

## 2021-03-01 ENCOUNTER — Other Ambulatory Visit: Payer: Self-pay

## 2021-03-01 ENCOUNTER — Ambulatory Visit (INDEPENDENT_AMBULATORY_CARE_PROVIDER_SITE_OTHER): Payer: 59 | Admitting: Allergy

## 2021-03-01 DIAGNOSIS — J4540 Moderate persistent asthma, uncomplicated: Secondary | ICD-10-CM

## 2021-03-03 NOTE — Progress Notes (Signed)
Office Visit Note  Patient: Julie [name]             Date of Birth: 05/05/1966           MRN: [id number]             PCP: Lin Landsman, MD Referring: Suella Broad,* Visit Date: 03/16/2021 Occupation: @GUAROCC @  Subjective:  Medication monitoring   History of Present Illness: Julie [name] is a 55 y.o. female with history of seropositive rheumatoid arthritis and fibromyalgia.  She was started on Orencia 125 mg subcutaneous injections on 01/30/2021.  She has been taking Plaquenil 200 mg 1 tablet by mouth twice daily Monday through Friday and methotrexate 0.7 ml injections once weekly.  Patient reports that she has noted significant improvement in her joint pain and inflammation since adding on Orencia to her current regimen.  She denies any joint pain or joint swelling at this time.  She has noticed some puffiness in her ankles but is unsure if it is due to her arthritis or fluid retention.  She has not been experiencing any morning stiffness or nocturnal pain.  She has not had any difficulty with ADLs.  She has not taken any prednisone since starting on Orencia.  She has not noticed any injection site reactions and has been tolerating without any side effects.  She denies any recent infections.      Activities of Daily Living:  Patient reports morning stiffness for 0 minutes.   Patient Denies nocturnal pain.  Difficulty dressing/grooming: Denies Difficulty climbing stairs: Denies Difficulty getting out of chair: Denies Difficulty using hands for taps, buttons, cutlery, and/or writing: Denies  Review of Systems  Constitutional:  Negative for fatigue.  HENT:  Positive for nose dryness. Negative for mouth sores and mouth dryness.   Eyes:  Positive for visual disturbance and dryness. Negative for pain and itching.  Respiratory:  Negative for cough, hemoptysis, shortness of breath and difficulty breathing.   Cardiovascular:  Negative for chest pain, palpitations and  swelling in legs/feet.  Gastrointestinal:  Positive for diarrhea. Negative for abdominal pain, blood in stool and constipation.  Endocrine: Negative for increased urination.  Genitourinary:  Negative for painful urination.  Musculoskeletal:  Positive for joint swelling. Negative for joint pain, joint pain, myalgias, muscle weakness, morning stiffness, muscle tenderness and myalgias.  Skin:  Negative for color change, rash and redness.  Allergic/Immunologic: Negative for susceptible to infections.  Neurological:  Positive for light-headedness. Negative for dizziness, numbness, headaches, memory loss and weakness.  Hematological:  Negative for swollen glands.  Psychiatric/Behavioral:  Negative for confusion and sleep disturbance.    PMFS History:  Patient Active Problem List   Diagnosis Date Noted   Anaphylactic shock due to adverse food reaction 02/01/2020   Moderate persistent asthma without complication 07/03/5101   Complication of anesthesia    Fibromyalgia 02/14/2018   History of gastroesophageal reflux (GERD) 02/14/2018   History of asthma 02/14/2018   Rheumatoid arthritis involving multiple sites with positive rheumatoid factor ([place]) 11/05/2016   High risk medication use 11/05/2016   LPRD (laryngopharyngeal reflux disease) 09/20/2015   Seasonal and perennial allergic rhinoconjunctivitis 09/20/2015   Acute sinusitis 07/29/2015   S/P total hysterectomy 05/25/2015    Past Medical History:  Diagnosis Date   Anemia    Asthma    Complication of anesthesia    Eczema    Esophageal dysphagia    GERD (gastroesophageal reflux disease)    PONV (postoperative nausea and vomiting)  Rheumatoid arthritis ([place])     Family History  Problem Relation Age of Onset   Asthma Mother    Eczema Mother    Sarcoidosis Mother    Hypertension Mother    Cancer Father    Hypotension Father    Congestive Heart Failure Father    Colon cancer Father    Multiple myeloma Father    Asthma Sister     GER disease Sister    Allergic rhinitis Neg Hx    Angioedema Neg Hx    Immunodeficiency Neg Hx    Urticaria Neg Hx    Esophageal cancer Neg Hx    Rectal cancer Neg Hx    Stomach cancer Neg Hx    Past Surgical History:  Procedure Laterality Date   ABDOMINAL HYSTERECTOMY     partial age 73   BILATERAL SALPINGECTOMY Bilateral 05/25/2015   Procedure: BILATERAL SALPINGECTOMY;  Surgeon: Servando Salina, MD;  Location: [facility];  Service: Gynecology;  Laterality: Bilateral;   CARPAL TUNNEL RELEASE Left 01/05/2020   Procedure: LEFT CARPAL TUNNEL RELEASE;  Surgeon: Daryll Brod, MD;  Location: [facility];  Service: Orthopedics;  Laterality: Left;  FOREARM BIER BLOCK   COLONOSCOPY     OVARIAN CYST REMOVAL Right 05/25/2015   Procedure: OVARIAN CYSTECTOMY;  Surgeon: Servando Salina, MD;  Location: [facility];  Service: Gynecology;  Laterality: Right;   TONSILLECTOMY     TUBAL LIGATION     Social History   Social History Narrative   Not on file   Immunization History  Administered Date(s) Administered   Influenza Inj Mdck Quad Pf 10/19/2017   Influenza Split 11/25/2014   Influenza, Quadrivalent, Recombinant, Inj, Pf 08/26/2019   Influenza, Seasonal, Injecte, Preservative Fre 09/20/2015   Influenza,inj,Quad PF,6+ Mos 08/04/2018   Influenza-Unspecified 07/18/2017   PFIZER(Purple Top)SARS-COV-2 Vaccination 11/28/2019, 12/19/2019, 06/23/2020     Objective: Vital Signs: BP 130/88 (BP Location: Left Arm, Patient Position: Sitting, Cuff Size: Normal)   Pulse 73   Ht 5' 1.25" (1.556 m)   Wt 194 lb 3.2 oz (88.1 kg)   LMP 05/19/2015   BMI 36.39 kg/m    Physical Exam Vitals and nursing note reviewed.  Constitutional:      Appearance: She is well-developed.  HENT:     Head: Normocephalic and atraumatic.  Eyes:     Conjunctiva/sclera: Conjunctivae normal.  Pulmonary:     Effort: Pulmonary effort is normal.  Abdominal:     Palpations: Abdomen is soft.  Musculoskeletal:      Cervical back: Normal range of motion.  Skin:    General: Skin is warm and dry.     Capillary Refill: Capillary refill takes less than 2 seconds.  Neurological:     Mental Status: She is alert and oriented to person, place, and time.  Psychiatric:        Behavior: Behavior normal.     Musculoskeletal Exam: C-spine, thoracic spine, lumbar spine have good range of motion with no discomfort.  Shoulder joints, elbow joints, wrist joints, MCPs, PIPs, DIPs have good range of motion with no synovitis.  Complete fist formation bilaterally.  Hip joints have good range of motion with no discomfort.  Knee joints have good range of motion with no warmth or effusion.  Ankle joints have good range of motion with no tenderness or inflammation.  No tenderness over MTP joints.  CDAI Exam: CDAI Score: 0.2  Patient Global: 1 mm; Provider Global: 1 mm Swollen: 0 ; Tender: 0  Joint Exam 03/16/2021  No joint exam has been documented for this visit   There is currently no information documented on the homunculus. Go to the Rheumatology activity and complete the homunculus joint exam.  Investigation: No additional findings.  Imaging: No results found.  Recent Labs: Lab Results  Component Value Date   WBC 6.0 01/12/2021   HGB 13.5 01/12/2021   PLT 297 01/12/2021   NA 140 01/12/2021   K 4.7 01/12/2021   CL 102 01/12/2021   CO2 32 01/12/2021   GLUCOSE 90 01/12/2021   BUN 14 01/12/2021   CREATININE 0.66 01/12/2021   BILITOT 0.3 01/12/2021   ALKPHOS 92 11/02/2020   AST 16 01/12/2021   ALT 16 01/12/2021   PROT 7.5 01/12/2021   ALBUMIN 3.7 11/02/2020   CALCIUM 9.6 01/12/2021   GFRAA 118 01/12/2021   QFTBGOLDPLUS NEGATIVE 10/13/2020    Speciality Comments: PLQ Eye Exam: 10/26/2020  WNL at [facility] Follow up in 1 year  Procedures:  No procedures performed Allergies: Aspirin, Augmentin [amoxicillin-pot clavulanate], and Peanut-containing drug products   Assessment / Plan:     Visit  Diagnoses: Rheumatoid arthritis involving multiple sites with positive rheumatoid factor (HCC) - +RF, +CCP, Erosive rheumatoid arthritis: She has no joint tenderness or synovitis on examination today.  She has noticed significant clinical improvement in her joint pain and inflammation since starting on Orencia 125 mg subcutaneous injections once weekly on 01/30/2021.  She has been tolerating Orencia without any side effects or injection site reactions.  She has continued on Plaquenil 200 mg 1 tablet by mouth twice daily Monday through Friday and methotrexate 0.7 mL sq injections once weekly.  She is not experiencing any morning stiffness or nocturnal pain.  She has not had any difficulty with ADLs.  She will remain on Orencia and Plaquenil as prescribed.  We discussed that since she has noticed much clinical improvement she does not need to increase the dose of methotrexate back up to 25 mg weekly.  We will apply for Rasuvo 20 mg subcutaneous injections once weekly through her insurance.  She was advised to notify us if she develops increased joint pain or joint swelling.  She will follow-up in the office in 3 months to reassess the full response of triple therapy.  High risk medication use - Orencia 125 mg sq injections once weekly patient was initially started on Orencia on 01/30/2021.  She continues to take, plaquenil 200 mg 1 tablet by mouth twice daily Monday through Friday.  We will apply for Rasuvo 20 mg subcutaneous injections once weekly.  PLQ Eye Exam: 10/26/2020.    TB gold negative on 10/13/20.  CBC and CMP WNL on 01/12/21.  She is due to update lab work today.  Orders released.  Her next lab work will be due in September and every 3 months to monitor for drug toxicity.  Standing orders for CBC and CMP remain in place.  - Plan: CBC with Differential/Platelet, COMPLETE METABOLIC PANEL WITH GFR She has not had any recent infections.  We discussed the importance of holding Orencia and methotrexate if she  develops signs or symptoms of infection and to resume once the infection has completely cleared.  Fibromyalgia: She has not had any recent fibromyalgia flares.  She is not having any myalgias or muscle tenderness currently.  Her energy level has started to improve since she has been sleeping better at night with less nocturnal pain.  We discussed the importance of regular exercise and good sleep hygiene.  Positive ANA (  antinuclear antibody) - 07/13/20: ANA is positive-low, nonspecific titer.RNP remains positive.  No clinical features of systemic lupus.  Anti-RNP antibodies present - No clinical features of autoimmune disease at this time.    Osteopenia of spine - DEXA on 08/24/2019: AP spine BMD 0.090 with T score -1.3.  Due for updated DEXA in December 2022.  History of Helicobacter pylori infection - Recently treated for H. pylori associated with chronic active gastritis.    Other medical conditions are listed as follows:   History of gastroesophageal reflux (GERD)  History of asthma  History of depression  Trigger middle finger of right hand - Resolved   Abnormal SPEP - Evaluated by Dr. Chryl Heck on 11/16/20.  Orders: Orders Placed This Encounter  Procedures   CBC with Differential/Platelet   COMPLETE METABOLIC PANEL WITH GFR   No orders of the defined types were placed in this encounter.   Follow-Up Instructions: Return in about 3 months (around 06/16/2021) for Rheumatoid arthritis.   Ofilia Neas, PA-C  Note - This record has been created using Dragon software.  Chart creation errors have been sought, but may not always  have been located. Such creation errors do not reflect on  the standard of medical care.

## 2021-03-07 ENCOUNTER — Ambulatory Visit: Payer: 59 | Admitting: Physician Assistant

## 2021-03-15 ENCOUNTER — Ambulatory Visit (INDEPENDENT_AMBULATORY_CARE_PROVIDER_SITE_OTHER): Payer: 59

## 2021-03-15 ENCOUNTER — Other Ambulatory Visit: Payer: Self-pay

## 2021-03-15 DIAGNOSIS — J4540 Moderate persistent asthma, uncomplicated: Secondary | ICD-10-CM

## 2021-03-16 ENCOUNTER — Encounter: Payer: Self-pay | Admitting: Physician Assistant

## 2021-03-16 ENCOUNTER — Ambulatory Visit (INDEPENDENT_AMBULATORY_CARE_PROVIDER_SITE_OTHER): Payer: 59 | Admitting: Physician Assistant

## 2021-03-16 ENCOUNTER — Telehealth: Payer: Self-pay

## 2021-03-16 VITALS — BP 130/88 | HR 73 | Ht 61.25 in | Wt 194.2 lb

## 2021-03-16 DIAGNOSIS — Z8619 Personal history of other infectious and parasitic diseases: Secondary | ICD-10-CM

## 2021-03-16 DIAGNOSIS — R768 Other specified abnormal immunological findings in serum: Secondary | ICD-10-CM | POA: Diagnosis not present

## 2021-03-16 DIAGNOSIS — M797 Fibromyalgia: Secondary | ICD-10-CM

## 2021-03-16 DIAGNOSIS — M0579 Rheumatoid arthritis with rheumatoid factor of multiple sites without organ or systems involvement: Secondary | ICD-10-CM

## 2021-03-16 DIAGNOSIS — M8588 Other specified disorders of bone density and structure, other site: Secondary | ICD-10-CM

## 2021-03-16 DIAGNOSIS — M65331 Trigger finger, right middle finger: Secondary | ICD-10-CM

## 2021-03-16 DIAGNOSIS — R7689 Other specified abnormal immunological findings in serum: Secondary | ICD-10-CM

## 2021-03-16 DIAGNOSIS — Z8719 Personal history of other diseases of the digestive system: Secondary | ICD-10-CM

## 2021-03-16 DIAGNOSIS — Z8659 Personal history of other mental and behavioral disorders: Secondary | ICD-10-CM

## 2021-03-16 DIAGNOSIS — R778 Other specified abnormalities of plasma proteins: Secondary | ICD-10-CM

## 2021-03-16 DIAGNOSIS — Z79899 Other long term (current) drug therapy: Secondary | ICD-10-CM | POA: Diagnosis not present

## 2021-03-16 DIAGNOSIS — Z8709 Personal history of other diseases of the respiratory system: Secondary | ICD-10-CM

## 2021-03-16 LAB — COMPLETE METABOLIC PANEL WITH GFR
AG Ratio: 1.5 (calc) (ref 1.0–2.5)
ALT: 16 U/L (ref 6–29)
AST: 18 U/L (ref 10–35)
Albumin: 4 g/dL (ref 3.6–5.1)
Alkaline phosphatase (APISO): 66 U/L (ref 37–153)
BUN: 11 mg/dL (ref 7–25)
CO2: 29 mmol/L (ref 20–32)
Calcium: 9 mg/dL (ref 8.6–10.4)
Chloride: 104 mmol/L (ref 98–110)
Creat: 0.65 mg/dL (ref 0.50–1.05)
GFR, Est African American: 118 mL/min/{1.73_m2} (ref >=60)
GFR, Est Non African American: 102 mL/min/{1.73_m2} (ref >=60)
Globulin: 2.6 g/dL (calc) (ref 1.9–3.7)
Glucose, Bld: 86 mg/dL (ref 65–99)
Potassium: 4.5 mmol/L (ref 3.5–5.3)
Sodium: 139 mmol/L (ref 135–146)
Total Bilirubin: 0.3 mg/dL (ref 0.2–1.2)
Total Protein: 6.6 g/dL (ref 6.1–8.1)

## 2021-03-16 LAB — CBC WITH DIFFERENTIAL/PLATELET
Absolute Monocytes: 456 cells/uL (ref 200–950)
Basophils Absolute: 71 cells/uL (ref 0–200)
Basophils Relative: 1.5 %
Eosinophils Absolute: 141 cells/uL (ref 15–500)
Eosinophils Relative: 3 %
HCT: 37.2 % (ref 35.0–45.0)
Hemoglobin: 12.3 g/dL (ref 11.7–15.5)
Lymphs Abs: 2543 cells/uL (ref 850–3900)
MCH: 28.1 pg (ref 27.0–33.0)
MCHC: 33.1 g/dL (ref 32.0–36.0)
MCV: 85.1 fL (ref 80.0–100.0)
MPV: 10.7 fL (ref 7.5–12.5)
Monocytes Relative: 9.7 %
Neutro Abs: 1490 cells/uL — ABNORMAL LOW (ref 1500–7800)
Neutrophils Relative %: 31.7 %
Platelets: 240 10*3/uL (ref 140–400)
RBC: 4.37 10*6/uL (ref 3.80–5.10)
RDW: 12.9 % (ref 11.0–15.0)
Total Lymphocyte: 54.1 %
WBC: 4.7 10*3/uL (ref 3.8–10.8)

## 2021-03-16 NOTE — Telephone Encounter (Signed)
Please apply for Rasuvo, per Hazel Sams, PA-C. Thanks!

## 2021-03-17 ENCOUNTER — Other Ambulatory Visit (HOSPITAL_COMMUNITY): Payer: Self-pay

## 2021-03-17 NOTE — Progress Notes (Signed)
CMP WNL.  Absolute neutrophils are borderline low. WBC count is WNL.  Rest of CBC WNL.

## 2021-03-17 NOTE — Telephone Encounter (Signed)
PA is not required through pt's plan per CoverMyMeds. Verified in [facility]; test claim revealed that insurance covers $456.93, leaving patient with a copay of $45.00.  Copay Card info verified and added in [facility].  BIN: [id number] PCN: [id number] GRP: [id number] ID: [id number]

## 2021-03-22 ENCOUNTER — Telehealth: Payer: Self-pay

## 2021-03-22 MED ORDER — RASUVO 20 MG/0.4ML ~~LOC~~ SOAJ: 20.0000 mg | SUBCUTANEOUS | 0 refills | Status: DC

## 2021-03-22 NOTE — Telephone Encounter (Signed)
Patient left a voicemail stating she spoke with the pharmacist yesterday, but forgot to ask if the medication needs to be kept in the refrigerator.  Patient requested a return call.

## 2021-03-22 NOTE — Telephone Encounter (Signed)
Rx for Rasuvo 20mg  weekly sent to [facility] with copay card information.  Patient had labs on 03/16/21 (CBC and CMP).  She is switching from MTX vial/syringe which she takes on Monday's. She has been advised to take Rasuvo on Monday's.  Sent her a Pharmacist, community message with copay card information as well.  Knox Saliva, PharmD, MPH Clinical Pharmacist (Rheumatology and Pulmonology)

## 2021-03-22 NOTE — Telephone Encounter (Signed)
Patient advised she was approved for Rasuvo. Patient advised the Rasuvo does not have to be refrigerated. Patient expressed understanding.

## 2021-03-28 NOTE — Progress Notes (Signed)
[address] [place] [address] Dept: [phone]  FOLLOW UP NOTE  Patient ID: Julie [name], female    DOB: December 29, 1965  Age: 55 y.o. MRN: [id number] Date of Office Visit: 03/29/2021  Assessment  Chief Complaint: Asthma  HPI Julie [name] is a 55 year old female who presents to the clinic for follow-up visit.  She was last seen in this clinic on 02/01/2020 by Dr. Curt Bears for evaluation of asthma, allergic rhinitis, reflux, food allergy to peanut and tree nut.  At today's visit, she reports her asthma has been well controlled with no shortness of breath, cough, or wheeze with activity or rest.  She denies asthma symptoms overnight.  She continues montelukast 10 mg once a day and has not needed albuterol since her last visit to this clinic.  She continues Dupixent 300 mg once every 2 weeks with no large or local reactions.  She reports a significant decrease in her symptoms of asthma while continuing on Dupixent injections.  Allergic rhinitis is reported as moderately well controlled with thick postnasal drainage with frequent throat clearing.  She continues Xyzal 5 mg once a day and is not currently using Flonase or azelastine.  She does report some epistaxis with her last use of medicated nasal sprays.  She has not had an episode of epistaxis since stopping medicated nasal sprays.  Reflux is reported as well controlled with no symptoms including heartburn or vomiting.  She continues omeprazole once a day.  She continues to avoid peanuts and tree nuts with no accidental ingestion or EpiPen use since her last visit to this clinic.  Her current medications are listed in the chart.   Drug Allergies:  Allergies  Allergen Reactions   Aspirin Other (See Comments)    pt couldn't take while on methotrexate, but is no longer taking   Augmentin [Amoxicillin-Pot Clavulanate] Other (See Comments)    G I upset   Peanut-Containing Drug Products     Physical Exam: BP 122/84   Pulse  68   Temp 98 F (36.7 C) (Temporal)   Resp 16   LMP 05/19/2015   SpO2 99%    Physical Exam Vitals reviewed.  Constitutional:      Appearance: Normal appearance.  HENT:     Head: Normocephalic and atraumatic.     Right Ear: Tympanic membrane normal.     Left Ear: Tympanic membrane normal.     Nose:     Comments: Bilateral nares normal.  Pharynx normal.  Ears normal.  Eyes normal.    Mouth/Throat:     Pharynx: Oropharynx is clear.  Eyes:     Conjunctiva/sclera: Conjunctivae normal.  Cardiovascular:     Rate and Rhythm: Normal rate and regular rhythm.     Heart sounds: Normal heart sounds. No murmur heard. Pulmonary:     Effort: Pulmonary effort is normal.     Breath sounds: Normal breath sounds.     Comments: Lungs clear to auscultation Musculoskeletal:        General: Normal range of motion.     Cervical back: Normal range of motion and neck supple.  Skin:    General: Skin is warm and dry.  Neurological:     Mental Status: She is alert and oriented to person, place, and time.  Psychiatric:        Mood and Affect: Mood normal.        Behavior: Behavior normal.        Thought Content: Thought content normal.  Judgment: Judgment normal.    Diagnostics: FVC 1.72, FEV1 1.41.  Predicted FVC 2.60, predicted FEV1 2.10.  Spirometry indicates possible restriction.  This is consistent with previous spirometry readings.  Assessment and Plan: 1. Moderate persistent asthma without complication   2. Seasonal and perennial allergic rhinoconjunctivitis   3. LPRD (laryngopharyngeal reflux disease)   4. Anaphylactic shock due to food, subsequent encounter     Meds ordered this encounter  Medications   cetirizine (ZYRTEC) 10 MG tablet    Sig: Zyrtec 10 mg tablet    Dispense:  30 tablet    Refill:  5   EPINEPHrine (AUVI-Q) 0.3 mg/0.3 mL IJ SOAJ injection    Sig: Inject 0.3 mg into the muscle once for 1 dose. As directed for life-threatening allergic reactions    Dispense:   2 each    Refill:  2   omeprazole (PRILOSEC) 40 MG capsule    Sig: Take 1 capsule (40 mg total) by mouth in the morning.    Dispense:  30 capsule    Refill:  2   albuterol (PROAIR HFA) 108 (90 Base) MCG/ACT inhaler    Sig: Inhale 2 puffs into the lungs every 4 (four) hours as needed.    Dispense:  18 g    Refill:  1     Patient Instructions  Asthma Continue montelukast 10 mg once a day to prevent cough or wheeze Continue albuterol 2 puffs once every 4 hours as needed for cough or wheeze You may use albuterol 2 puffs 5 to 15 minutes before activity to decrease cough or wheeze For asthma flare, begin Alvesco 80-2 puffs twice a day with a spacer for 2 weeks or until cough and wheeze free Continue Dupixent 300 mg once every 14 days  Allergic rhinitis Continue allergen avoidance measures directed toward grass pollen, weed pollen, ragweed pollen, tree pollen, mold, dust mite, cat, dog, feather, and horse as listed below Begin cetirizine (Zyrtec) 10 mg once a day as needed for runny nose or itch. This will replace levocetirizine for now. Remember to rotate to a different antihistamine about every 3 months. Some examples of over the counter antihistamines include Zyrtec (cetirizine), Xyzal (levocetirizine), Allegra (fexofenadine), and Claritin (loratidine).  Continue azelastine 2 sprays in each nostril up to twice a day as needed for nasal symptoms Consider saline nasal rinses as needed for nasal symptoms. Use this before any medicated nasal sprays for best result  Reflux Continue dietary and lifestyle modifications as listed below  Food allergy Continue to avoid peanut and tree nuts.  In case of an allergic reaction, take Benadryl 50 mg every 4 hours, and if life-threatening symptoms occur, inject with EpiPen 0.3 mg.  Call the clinic if this treatment plan is not working well for you.  Follow up in 6 months or sooner if needed.   Return in about 6 months (around 09/29/2021), or if  symptoms worsen or fail to improve.    Thank you for the opportunity to care for this patient.  Please do not hesitate to contact me with questions.  Gareth Morgan, FNP [facility]

## 2021-03-28 NOTE — Patient Instructions (Signed)
Asthma Continue montelukast 10 mg once a day to prevent cough or wheeze Continue albuterol 2 puffs once every 4 hours as needed for cough or wheeze You may use albuterol 2 puffs 5 to 15 minutes before activity to decrease cough or wheeze For asthma flare, begin Alvesco 80-2 puffs twice a day with a spacer for 2 weeks or until cough and wheeze free Continue Dupixent 300 mg once every 14 days  Allergic rhinitis Continue allergen avoidance measures directed toward grass pollen, weed pollen, ragweed pollen, tree pollen, mold, dust mite, cat, dog, feather, and horse as listed below Begin cetirizine (Zyrtec) 10 mg once a day as needed for runny nose or itch. This will replace levocetirizine for now. Remember to rotate to a different antihistamine about every 3 months. Some examples of over the counter antihistamines include Zyrtec (cetirizine), Xyzal (levocetirizine), Allegra (fexofenadine), and Claritin (loratidine).  Continue azelastine 2 sprays in each nostril up to twice a day as needed for nasal symptoms Consider saline nasal rinses as needed for nasal symptoms. Use this before any medicated nasal sprays for best result  Reflux Continue dietary and lifestyle modifications as listed below  Food allergy Continue to avoid peanut and tree nuts.  In case of an allergic reaction, take Benadryl 50 mg every 4 hours, and if life-threatening symptoms occur, inject with EpiPen 0.3 mg.  Call the clinic if this treatment plan is not working well for you.  Follow up in 6 months or sooner if needed.  Reducing Pollen Exposure The American Academy of Allergy, Asthma and Immunology suggests the following steps to reduce your exposure to pollen during allergy seasons. Do not hang sheets or clothing out to dry; pollen may collect on these items. Do not mow lawns or spend time around freshly cut grass; mowing stirs up pollen. Keep windows closed at night.  Keep car windows closed while driving. Minimize  morning activities outdoors, a time when pollen counts are usually at their highest. Stay indoors as much as possible when pollen counts or humidity is high and on windy days when pollen tends to remain in the air longer. Use air conditioning when possible.  Many air conditioners have filters that trap the pollen spores. Use a HEPA room air filter to remove pollen form the indoor air you breathe.  Control of Mold Allergen Mold and fungi can grow on a variety of surfaces provided certain temperature and moisture conditions exist.  Outdoor molds grow on plants, decaying vegetation and soil.  The major outdoor mold, Alternaria and Cladosporium, are found in very high numbers during hot and dry conditions.  Generally, a late Summer - Fall peak is seen for common outdoor fungal spores.  Rain will temporarily lower outdoor mold spore count, but counts rise rapidly when the rainy period ends.  The most important indoor molds are Aspergillus and Penicillium.  Dark, humid and poorly ventilated basements are ideal sites for mold growth.  The next most common sites of mold growth are the bathroom and the kitchen.  Outdoor [employer] Use air conditioning and keep windows closed Avoid exposure to decaying vegetation. Avoid leaf raking. Avoid grain handling. Consider wearing a face mask if working in moldy areas.  Indoor Mold Control Maintain humidity below 50%. Clean washable surfaces with 5% bleach solution. Remove sources e.g. Contaminated carpets.   Control of Dust Mite Allergen Dust mites play a major role in allergic asthma and rhinitis. They occur in environments with high humidity wherever human skin is found. Dust  mites absorb humidity from the atmosphere (ie, they do not drink) and feed on organic matter (including shed human and animal skin). Dust mites are a microscopic type of insect that you cannot see with the naked eye. High levels of dust mites have been detected from mattresses, pillows,  carpets, upholstered furniture, bed covers, clothes, soft toys and any woven material. The principal allergen of the dust mite is found in its feces. A gram of dust may contain 1,000 mites and 250,000 fecal particles. Mite antigen is easily measured in the air during house cleaning activities. Dust mites do not bite and do not cause harm to humans, other than by triggering allergies/asthma.  Ways to decrease your exposure to dust mites in your home:  1. Encase mattresses, box springs and pillows with a mite-impermeable barrier or cover  2. Wash sheets, blankets and drapes weekly in hot water (130 F) with detergent and dry them in a dryer on the hot setting.  3. Have the room cleaned frequently with a vacuum cleaner and a damp dust-mop. For carpeting or rugs, vacuuming with a vacuum cleaner equipped with a high-efficiency particulate air (HEPA) filter. The dust mite allergic individual should not be in a room which is being cleaned and should wait 1 hour after cleaning before going into the room.  4. Do not sleep on upholstered furniture (eg, couches).  5. If possible removing carpeting, upholstered furniture and drapery from the home is ideal. Horizontal blinds should be eliminated in the rooms where the person spends the most time (bedroom, study, television room). Washable vinyl, roller-type shades are optimal.  6. Remove all non-washable stuffed toys from the bedroom. Wash stuffed toys weekly like sheets and blankets above.  7. Reduce indoor humidity to less than 50%. Inexpensive humidity monitors can be purchased at most hardware stores. Do not use a humidifier as can make the problem worse and are not recommended.  Control of Dog or Cat Allergen Avoidance is the best way to manage a dog or cat allergy. If you have a dog or cat and are allergic to dog or cats, consider removing the dog or cat from the home. If you have a dog or cat but don't want to find it a new home, or if your family  wants a pet even though someone in the household is allergic, here are some strategies that may help keep symptoms at bay:  Keep the pet out of your bedroom and restrict it to only a few rooms. Be advised that keeping the dog or cat in only one room will not limit the allergens to that room. Don't pet, hug or kiss the dog or cat; if you do, wash your hands with soap and water. High-efficiency particulate air (HEPA) cleaners run continuously in a bedroom or living room can reduce allergen levels over time. Regular use of a high-efficiency vacuum cleaner or a central vacuum can reduce allergen levels. Giving your dog or cat a bath at least once a week can reduce airborne allergen.

## 2021-03-29 ENCOUNTER — Ambulatory Visit (INDEPENDENT_AMBULATORY_CARE_PROVIDER_SITE_OTHER): Payer: 59

## 2021-03-29 ENCOUNTER — Ambulatory Visit: Payer: 59 | Admitting: Family Medicine

## 2021-03-29 ENCOUNTER — Encounter: Payer: Self-pay | Admitting: Family Medicine

## 2021-03-29 ENCOUNTER — Other Ambulatory Visit: Payer: Self-pay

## 2021-03-29 VITALS — BP 122/84 | HR 68 | Temp 98.0°F | Resp 16

## 2021-03-29 DIAGNOSIS — T7800XD Anaphylactic reaction due to unspecified food, subsequent encounter: Secondary | ICD-10-CM

## 2021-03-29 DIAGNOSIS — J302 Other seasonal allergic rhinitis: Secondary | ICD-10-CM

## 2021-03-29 DIAGNOSIS — K219 Gastro-esophageal reflux disease without esophagitis: Secondary | ICD-10-CM

## 2021-03-29 DIAGNOSIS — J4540 Moderate persistent asthma, uncomplicated: Secondary | ICD-10-CM

## 2021-03-29 DIAGNOSIS — J3089 Other allergic rhinitis: Secondary | ICD-10-CM | POA: Diagnosis not present

## 2021-03-29 DIAGNOSIS — H1013 Acute atopic conjunctivitis, bilateral: Secondary | ICD-10-CM

## 2021-03-29 DIAGNOSIS — H1010 Acute atopic conjunctivitis, unspecified eye: Secondary | ICD-10-CM

## 2021-03-29 MED ORDER — EPINEPHRINE 0.3 MG/0.3ML IJ SOAJ: 0.3000 mg | Freq: Once | INTRAMUSCULAR | 2 refills | Status: AC

## 2021-03-29 MED ORDER — CETIRIZINE HCL 10 MG PO TABS: ORAL_TABLET | ORAL | 5 refills | Status: DC

## 2021-03-29 MED ORDER — OMEPRAZOLE 40 MG PO CPDR: 40.0000 mg | DELAYED_RELEASE_CAPSULE | Freq: Every morning | ORAL | 2 refills | Status: DC

## 2021-03-29 MED ORDER — ALBUTEROL SULFATE HFA 108 (90 BASE) MCG/ACT IN AERS: 2.0000 | INHALATION_SPRAY | RESPIRATORY_TRACT | 1 refills | Status: DC | PRN

## 2021-04-09 ENCOUNTER — Other Ambulatory Visit: Payer: Self-pay | Admitting: Physician Assistant

## 2021-04-10 NOTE — Telephone Encounter (Signed)
Last Visit: 03/16/2021  Next Visit: 06/16/2021  Labs: 03/16/2021 CMP WNL.  Absolute neutrophils are borderline low. WBC count is WNL.  Rest ofCBC WNL.  Eye exam: 10/26/2020  WNL   Current Dose per office note 03/16/2021: plaquenil 200 mg 1 tablet by mouth twice daily Monday through Friday  [id number] arthritis involving multiple sites with positive rheumatoid factor  Last Fill: 01/09/2021  Okay to refill Plaquenil?

## 2021-04-11 ENCOUNTER — Ambulatory Visit: Payer: Self-pay

## 2021-04-12 ENCOUNTER — Ambulatory Visit (INDEPENDENT_AMBULATORY_CARE_PROVIDER_SITE_OTHER): Payer: 59 | Admitting: *Deleted

## 2021-04-12 DIAGNOSIS — J4540 Moderate persistent asthma, uncomplicated: Secondary | ICD-10-CM

## 2021-04-13 ENCOUNTER — Other Ambulatory Visit: Payer: Self-pay | Admitting: Family Medicine

## 2021-04-13 DIAGNOSIS — Z1231 Encounter for screening mammogram for malignant neoplasm of breast: Secondary | ICD-10-CM

## 2021-04-17 ENCOUNTER — Ambulatory Visit
Admission: RE | Admit: 2021-04-17 | Discharge: 2021-04-17 | Disposition: A | Discharge status: Home / Self Care | Payer: 59 | Source: Ambulatory Visit | Attending: Family Medicine | Admitting: Family Medicine

## 2021-04-17 ENCOUNTER — Other Ambulatory Visit: Payer: Self-pay

## 2021-04-17 DIAGNOSIS — Z1231 Encounter for screening mammogram for malignant neoplasm of breast: Secondary | ICD-10-CM

## 2021-04-19 ENCOUNTER — Other Ambulatory Visit: Payer: Self-pay | Admitting: Physician Assistant

## 2021-04-19 DIAGNOSIS — M0579 Rheumatoid arthritis with rheumatoid factor of multiple sites without organ or systems involvement: Secondary | ICD-10-CM

## 2021-04-19 NOTE — Telephone Encounter (Signed)
Next Visit: 06/16/2021  Last Visit: 03/16/2021  Last Fill: 01/30/2021   [id number] arthritis involving multiple sites with positive rheumatoid factor  Current Dose per office note 03/16/2021: Orencia 125 mg sq injections once weekly  Labs: 03/16/2021 CMP WNL.  Absolute neutrophils are borderline low. WBC count is WNL.  Rest ofCBC WNL.  TB Gold: 10/13/2020 Neg    Okay to refill Orencia?

## 2021-04-26 ENCOUNTER — Other Ambulatory Visit: Payer: Self-pay

## 2021-04-26 ENCOUNTER — Ambulatory Visit (INDEPENDENT_AMBULATORY_CARE_PROVIDER_SITE_OTHER): Payer: 59

## 2021-04-26 DIAGNOSIS — J4540 Moderate persistent asthma, uncomplicated: Secondary | ICD-10-CM

## 2021-05-03 ENCOUNTER — Other Ambulatory Visit: Payer: Self-pay

## 2021-05-03 ENCOUNTER — Inpatient Hospital Stay: Payer: 59

## 2021-05-03 ENCOUNTER — Inpatient Hospital Stay: Payer: 59 | Attending: Hematology and Oncology | Admitting: Hematology and Oncology

## 2021-05-03 VITALS — BP 155/89 | HR 71 | Temp 98.4°F | Resp 18 | Wt 196.0 lb

## 2021-05-03 DIAGNOSIS — J45909 Unspecified asthma, uncomplicated: Secondary | ICD-10-CM | POA: Diagnosis not present

## 2021-05-03 DIAGNOSIS — D472 Monoclonal gammopathy: Secondary | ICD-10-CM

## 2021-05-03 DIAGNOSIS — M069 Rheumatoid arthritis, unspecified: Secondary | ICD-10-CM | POA: Insufficient documentation

## 2021-05-03 LAB — CBC WITH DIFFERENTIAL/PLATELET
Abs Immature Granulocytes: 0 10*3/uL (ref 0.00–0.07)
Basophils Absolute: 0.1 10*3/uL (ref 0.0–0.1)
Basophils Relative: 1 %
Eosinophils Absolute: 0.1 10*3/uL (ref 0.0–0.5)
Eosinophils Relative: 3 %
HCT: 37.1 % (ref 36.0–46.0)
Hemoglobin: 12.3 g/dL (ref 12.0–15.0)
Immature Granulocytes: 0 %
Lymphocytes Relative: 45 %
Lymphs Abs: 1.8 10*3/uL (ref 0.7–4.0)
MCH: 28.8 pg (ref 26.0–34.0)
MCHC: 33.2 g/dL (ref 30.0–36.0)
MCV: 86.9 fL (ref 80.0–100.0)
Monocytes Absolute: 0.4 10*3/uL (ref 0.1–1.0)
Monocytes Relative: 9 %
Neutro Abs: 1.7 10*3/uL (ref 1.7–7.7)
Neutrophils Relative %: 42 %
Platelets: 236 10*3/uL (ref 150–400)
RBC: 4.27 MIL/uL (ref 3.87–5.11)
RDW: 12.3 % (ref 11.5–15.5)
WBC: 4 10*3/uL (ref 4.0–10.5)
nRBC: 0 % (ref 0.0–0.2)

## 2021-05-03 LAB — CMP (CANCER CENTER ONLY)
ALT: 20 U/L (ref 0–44)
AST: 18 U/L (ref 15–41)
Albumin: 4 g/dL (ref 3.5–5.0)
Alkaline Phosphatase: 82 U/L (ref 38–126)
Anion gap: 8 (ref 5–15)
BUN: 12 mg/dL (ref 6–20)
CO2: 28 mmol/L (ref 22–32)
Calcium: 9.6 mg/dL (ref 8.9–10.3)
Chloride: 106 mmol/L (ref 98–111)
Creatinine: 0.74 mg/dL (ref 0.44–1.00)
GFR, Estimated: >60 mL/min (ref >60)
Glucose, Bld: 91 mg/dL (ref 70–99)
Potassium: 4.4 mmol/L (ref 3.5–5.1)
Sodium: 142 mmol/L (ref 135–145)
Total Bilirubin: 0.3 mg/dL (ref 0.3–1.2)
Total Protein: 7.3 g/dL (ref 6.5–8.1)

## 2021-05-03 NOTE — Progress Notes (Signed)
[facility] CONSULT NOTE  Patient Care Team: Lin Landsman, MD as PCP - General (Family Medicine)  CHIEF COMPLAINTS/PURPOSE OF CONSULTATION:  Abnormal SPEP  ASSESSMENT & PLAN:    This is a pleasant 55 year old female patient with a past medical history significant for rheumatoid arthritis, asthma, iron deficiency anemia referred to hematology for evaluation of abnormal labs. 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  She is here for follow-up since her last visit, doing well for the most part. Physical examination without any concerns.  She is struggling to lose some weight and is following up with some nutritionist.  Labs from yesterday showed no evidence of anemia or other evidence of endorgan damage.  SPEP and kappa lambda ratio in process.  If no change in the monoclonal protein, she can return to clinic in 6 months to 1 year.   HISTORY OF PRESENTING ILLNESS:   Julie [name] 55 y.o. female is here because of abnormal SPEP.  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 Since last visit, she has been dealing with some weight issues and has been trying to follow-up with a nutritionist.  She feels tired for the most part otherwise denies any new complaints.  She has some long-term sleep issues which may also be contributing to the fatigue.  She has some scattered bone pains which resolve, no persistent bone pains.  No other B symptoms.  Rest of the pertinent 10 point ROS reviewed and negative.  MEDICAL HISTORY:  Past Medical History:  Diagnosis Date   Anemia    Asthma    Complication of anesthesia    Eczema    Esophageal dysphagia    GERD (gastroesophageal reflux disease)    PONV (postoperative  nausea and vomiting)    Rheumatoid arthritis ([place])     SURGICAL HISTORY: Past Surgical History:  Procedure Laterality Date   ABDOMINAL HYSTERECTOMY     partial age 69   BILATERAL SALPINGECTOMY Bilateral 05/25/2015   Procedure: BILATERAL SALPINGECTOMY;  Surgeon: Servando Salina, MD;  Location: [facility];  Service: Gynecology;  Laterality: Bilateral;   CARPAL TUNNEL RELEASE Left 01/05/2020   Procedure: LEFT CARPAL TUNNEL RELEASE;  Surgeon: Daryll Brod, MD;  Location: [facility];  Service: Orthopedics;  Laterality: Left;  FOREARM BIER BLOCK   COLONOSCOPY     OVARIAN CYST REMOVAL Right 05/25/2015   Procedure: OVARIAN CYSTECTOMY;  Surgeon: Servando Salina, MD;  Location: [facility];  Service: Gynecology;  Laterality: Right;   TONSILLECTOMY     TUBAL LIGATION      SOCIAL HISTORY: Social History   Socioeconomic History   Marital status: Married    Spouse name: Not on file   Number of children: Not on file   Years of education: Not on file   Highest education level: Not on file  Occupational History   Not on file  Tobacco Use   Smoking status: Never   Smokeless tobacco: Never  Vaping Use   Vaping Use: Never used  Substance and Sexual Activity   Alcohol use: No   Drug use: No   Sexual activity: Not on file  Other Topics Concern   Not on file  Social History Narrative   Not on file   Social Determinants of Health  Financial Resource Strain: Not on file  Food Insecurity: Not on file  Transportation Needs: Not on file  Physical Activity: Not on file  Stress: Not on file  Social Connections: Not on file  Intimate Partner Violence: Not on file    FAMILY HISTORY: Family History  Problem Relation Age of Onset   Asthma Mother    Eczema Mother    Sarcoidosis Mother    Hypertension Mother    Cancer Father    Hypotension Father    Congestive Heart Failure Father    Colon cancer Father    Multiple myeloma Father    Asthma Sister    GER disease Sister     Allergic rhinitis Neg Hx    Angioedema Neg Hx    Immunodeficiency Neg Hx    Urticaria Neg Hx    Esophageal cancer Neg Hx    Rectal cancer Neg Hx    Stomach cancer Neg Hx     ALLERGIES:  is allergic to aspirin, augmentin [amoxicillin-pot clavulanate], and peanut-containing drug products.  MEDICATIONS:  Current Outpatient Medications  Medication Sig Dispense Refill   albuterol (PROAIR HFA) 108 (90 Base) MCG/ACT inhaler Inhale 2 puffs into the lungs every 4 (four) hours as needed. 18 g 1   B-D TB SYRINGE 1CC/27GX1/2" 27G X 1/2" 1 ML MISC Use to inject Methotrexate weekly. 12 each 3   cetirizine (ZYRTEC) 10 MG tablet Zyrtec 10 mg tablet 30 tablet 5   diclofenac Sodium (VOLTAREN) 1 % GEL Apply 2g to 4g to affected area up to 4 times daily as needed. 400 g 4   DUPIXENT 300 MG/2ML prefilled syringe INJECT 300MG SUBCUTANEOUSLY EVERY OTHER WEEK 4 mL 11   EPINEPHrine 0.3 mg/0.3 mL IJ SOAJ injection Inject 0.3 mLs (0.3 mg total) into the muscle as needed for anaphylaxis. 2 each 2   escitalopram (LEXAPRO) 20 MG tablet Take 20 mg by mouth daily.  5   famotidine (PEPCID) 40 MG tablet Take 40 mg by mouth daily.     folic acid (FOLVITE) 1 MG tablet Take 1 tablet (1 mg total) by mouth 2 (two) times daily. 180 tablet 3   hydroxychloroquine (PLAQUENIL) 200 MG tablet TAKE 1 TABLET TWICE A DAY MONDAY -FRIDAY 40 tablet 2   Methotrexate, PF, (RASUVO) 20 MG/0.4ML SOAJ Inject 20 mg into the skin once a week. 4.8 mL 0   montelukast (SINGULAIR) 10 MG tablet Take 10 mg by mouth daily.     Multiple Vitamins-Minerals (DAILY MULTIVITAMIN PO) Take 1 tablet by mouth daily.      Nutritional Supplements (ESTROVEN PO) Take by mouth.     omeprazole (PRILOSEC) 40 MG capsule Take 1 capsule (40 mg total) by mouth in the morning. 30 capsule 2   ORENCIA CLICKJECT 696 MG/ML SOAJ INJECT 125MG SUBCUTANEOUSLY WEEKLY 12 mL 0   Current Facility-Administered Medications  Medication Dose Route Frequency Provider Last Rate Last Admin    Dupilumab SOSY 300 mg  300 mg Subcutaneous Q14 Days Dara Hoyer, FNP   300 mg at 04/26/21 1022    PHYSICAL EXAMINATION: ECOG PERFORMANCE STATUS: 0 - Asymptomatic  Vitals:   05/03/21 1516  BP: (!) 155/89  Pulse: 71  Resp: 18  Temp: 98.4 F (36.9 C)  SpO2: 100%   Filed Weights   05/03/21 1516  Weight: 196 lb (88.9 kg)   Physical Exam Constitutional:      Appearance: Normal appearance.  HENT:     Head: Normocephalic and atraumatic.  Cardiovascular:  Rate and Rhythm: Normal rate and regular rhythm.  Pulmonary:     Effort: Pulmonary effort is normal.     Breath sounds: Normal breath sounds.  Abdominal:     General: Abdomen is flat.     Palpations: Abdomen is soft.  Musculoskeletal:        General: No swelling or tenderness.  Skin:    General: Skin is warm and dry.  Neurological:     General: No focal deficit present.     Mental Status: She is alert.  Psychiatric:        Mood and Affect: Mood normal.        Behavior: Behavior normal.     LABORATORY DATA:  I have reviewed the data as listed Lab Results  Component Value Date   WBC 4.0 05/03/2021   HGB 12.3 05/03/2021   HCT 37.1 05/03/2021   MCV 86.9 05/03/2021   PLT 236 05/03/2021     Chemistry      Component Value Date/Time   NA 142 05/03/2021 1439   NA 140 10/02/2016 1602   K 4.4 05/03/2021 1439   CL 106 05/03/2021 1439   CO2 28 05/03/2021 1439   BUN 12 05/03/2021 1439   BUN 8 10/02/2016 1602   CREATININE 0.74 05/03/2021 1439   CREATININE 0.65 03/16/2021 1442      Component Value Date/Time   CALCIUM 9.6 05/03/2021 1439   ALKPHOS 82 05/03/2021 1439   AST 18 05/03/2021 1439   ALT 20 05/03/2021 1439   BILITOT 0.3 05/03/2021 1439      RADIOGRAPHIC STUDIES: I have personally reviewed the radiological images as listed and agreed with the findings in the report. MM 3D SCREEN BREAST BILATERAL  Result Date: 04/18/2021 CLINICAL DATA:  Screening. EXAM: DIGITAL SCREENING BILATERAL MAMMOGRAM WITH  TOMOSYNTHESIS AND CAD TECHNIQUE: Bilateral screening digital craniocaudal and mediolateral oblique mammograms were obtained. Bilateral screening digital breast tomosynthesis was performed. The images were evaluated with computer-aided detection. COMPARISON:  Previous exam(s). ACR Breast Density Category b: There are scattered areas of fibroglandular density. FINDINGS: There are no findings suspicious for malignancy. IMPRESSION: No mammographic evidence of malignancy. A result letter of this screening mammogram will be mailed directly to the patient. RECOMMENDATION: Screening mammogram in one year. (Code:SM-B-01Y) BI-RADS CATEGORY  1: Negative. Electronically Signed   By: Lillia Mountain M.D.   On: 04/18/2021 15:10     I have reviewed CBC and CMP, no concerns.  SPEP and kappa lambda ratio in progress. I spent 20 minutes in the care of this patient including history and physical, review of records, discussion about MGUS and surveillance recommendations.    Benay Pike, MD 05/04/2021 3:11 PM

## 2021-05-04 ENCOUNTER — Encounter: Payer: Self-pay | Admitting: Hematology and Oncology

## 2021-05-04 LAB — KAPPA/LAMBDA LIGHT CHAINS
Kappa free light chain: 19.4 mg/L (ref 3.3–19.4)
Kappa, lambda light chain ratio: 1.85 — ABNORMAL HIGH (ref 0.26–1.65)
Lambda free light chains: 10.5 mg/L (ref 5.7–26.3)

## 2021-05-08 LAB — PROTEIN ELECTROPHORESIS, SERUM, WITH REFLEX
A/G Ratio: 1.2 (ref 0.7–1.7)
Albumin ELP: 3.7 g/dL (ref 2.9–4.4)
Alpha-1-Globulin: 0.2 g/dL (ref 0.0–0.4)
Alpha-2-Globulin: 0.7 g/dL (ref 0.4–1.0)
Beta Globulin: 1 g/dL (ref 0.7–1.3)
Gamma Globulin: 1.2 g/dL (ref 0.4–1.8)
Globulin, Total: 3 g/dL (ref 2.2–3.9)
M-Spike, %: 0.4 g/dL — ABNORMAL HIGH
SPEP Interpretation: 0
Total Protein ELP: 6.7 g/dL (ref 6.0–8.5)

## 2021-05-08 LAB — IMMUNOFIXATION REFLEX, SERUM
IgA: 160 mg/dL (ref 87–352)
IgG (Immunoglobin G), Serum: 1389 mg/dL (ref 586–1602)
IgM (Immunoglobulin M), Srm: 74 mg/dL (ref 26–217)

## 2021-05-10 ENCOUNTER — Ambulatory Visit (INDEPENDENT_AMBULATORY_CARE_PROVIDER_SITE_OTHER): Payer: 59

## 2021-05-10 ENCOUNTER — Other Ambulatory Visit: Payer: Self-pay

## 2021-05-10 DIAGNOSIS — J4540 Moderate persistent asthma, uncomplicated: Secondary | ICD-10-CM | POA: Diagnosis not present

## 2021-05-11 ENCOUNTER — Other Ambulatory Visit: Payer: 59

## 2021-05-18 ENCOUNTER — Ambulatory Visit: Payer: 59 | Admitting: Hematology and Oncology

## 2021-05-30 ENCOUNTER — Other Ambulatory Visit: Payer: Self-pay | Admitting: Physician Assistant

## 2021-05-30 ENCOUNTER — Telehealth: Payer: Self-pay | Admitting: *Deleted

## 2021-05-30 DIAGNOSIS — M0579 Rheumatoid arthritis with rheumatoid factor of multiple sites without organ or systems involvement: Secondary | ICD-10-CM

## 2021-05-30 NOTE — Telephone Encounter (Signed)
Received fax from [facility] stating they have been trying to reach patient to refill Orencia. They have been unsuccessful in reaching patient. Call back number is [phone]. Patient advised to contact the pharmacy to set up shipment.

## 2021-05-30 NOTE — Telephone Encounter (Signed)
Last Visit: 03/16/2021   Next Visit: 06/16/2021   Labs: 05/03/2021 WNL  Current Dose per office note 03/16/2021: Rasuvo 20 mg subcutaneous injections once weekly.   [id number] arthritis involving multiple sites with positive rheumatoid factor   Last Fill: 03/22/2021  Okay to refill Rasuvo?

## 2021-05-31 ENCOUNTER — Ambulatory Visit (INDEPENDENT_AMBULATORY_CARE_PROVIDER_SITE_OTHER): Payer: 59

## 2021-05-31 ENCOUNTER — Other Ambulatory Visit: Payer: Self-pay

## 2021-05-31 DIAGNOSIS — J4540 Moderate persistent asthma, uncomplicated: Secondary | ICD-10-CM | POA: Diagnosis not present

## 2021-06-02 NOTE — Progress Notes (Deleted)
Office Visit Note  Patient: Julie [name]             Date of Birth: May 04, 1966           MRN: [id number]             PCP: Lin Landsman, MD Referring: Lin Landsman, MD Visit Date: 06/16/2021 Occupation: @GUAROCC @  Subjective:  No chief complaint on file.   History of Present Illness: Julie [name] is a 55 y.o. female ***   Activities of Daily Living:  Patient reports morning stiffness for *** {minute/hour:19697}.   Patient {ACTIONS;DENIES/REPORTS:21021675::"Denies"} nocturnal pain.  Difficulty dressing/grooming: {ACTIONS;DENIES/REPORTS:21021675::"Denies"} Difficulty climbing stairs: {ACTIONS;DENIES/REPORTS:21021675::"Denies"} Difficulty getting out of chair: {ACTIONS;DENIES/REPORTS:21021675::"Denies"} Difficulty using hands for taps, buttons, cutlery, and/or writing: {ACTIONS;DENIES/REPORTS:21021675::"Denies"}  No Rheumatology ROS completed.   PMFS History:  Patient Active Problem List   Diagnosis Date Noted   Anaphylactic shock due to adverse food reaction 02/01/2020   Moderate persistent asthma without complication 03/10/7627   Complication of anesthesia    Fibromyalgia 02/14/2018   History of gastroesophageal reflux (GERD) 02/14/2018   History of asthma 02/14/2018   Rheumatoid arthritis involving multiple sites with positive rheumatoid factor ([place]) 11/05/2016   High risk medication use 11/05/2016   LPRD (laryngopharyngeal reflux disease) 09/20/2015   Seasonal and perennial allergic rhinoconjunctivitis 09/20/2015   Acute sinusitis 07/29/2015   S/P total hysterectomy 05/25/2015    Past Medical History:  Diagnosis Date   Anemia    Asthma    Complication of anesthesia    Eczema    Esophageal dysphagia    GERD (gastroesophageal reflux disease)    PONV (postoperative nausea and vomiting)    Rheumatoid arthritis ([place])     Family History  Problem Relation Age of Onset   Asthma Mother    Eczema Mother    Sarcoidosis Mother    Hypertension Mother     Cancer Father    Hypotension Father    Congestive Heart Failure Father    Colon cancer Father    Multiple myeloma Father    Asthma Sister    GER disease Sister    Allergic rhinitis Neg Hx    Angioedema Neg Hx    Immunodeficiency Neg Hx    Urticaria Neg Hx    Esophageal cancer Neg Hx    Rectal cancer Neg Hx    Stomach cancer Neg Hx    Past Surgical History:  Procedure Laterality Date   ABDOMINAL HYSTERECTOMY     partial age 53   BILATERAL SALPINGECTOMY Bilateral 05/25/2015   Procedure: BILATERAL SALPINGECTOMY;  Surgeon: Servando Salina, MD;  Location: [facility];  Service: Gynecology;  Laterality: Bilateral;   CARPAL TUNNEL RELEASE Left 01/05/2020   Procedure: LEFT CARPAL TUNNEL RELEASE;  Surgeon: Daryll Brod, MD;  Location: [facility];  Service: Orthopedics;  Laterality: Left;  FOREARM BIER BLOCK   COLONOSCOPY     OVARIAN CYST REMOVAL Right 05/25/2015   Procedure: OVARIAN CYSTECTOMY;  Surgeon: Servando Salina, MD;  Location: [facility];  Service: Gynecology;  Laterality: Right;   TONSILLECTOMY     TUBAL LIGATION     Social History   Social History Narrative   Not on file   Immunization History  Administered Date(s) Administered   Influenza Inj Mdck Quad Pf 10/19/2017   Influenza Split 11/25/2014   Influenza, Quadrivalent, Recombinant, Inj, Pf 08/26/2019   Influenza, Seasonal, Injecte, Preservative Fre 09/20/2015   Influenza,inj,Quad PF,6+ Mos 08/04/2018   Influenza-Unspecified 07/18/2017   PFIZER(Purple Top)SARS-COV-2 Vaccination 11/28/2019, 12/19/2019, 06/23/2020  Objective: Vital Signs: LMP 05/19/2015    Physical Exam   Musculoskeletal Exam: ***  CDAI Exam: CDAI Score: -- Patient Global: --; Provider Global: -- Swollen: --; Tender: -- Joint Exam 06/16/2021   No joint exam has been documented for this visit   There is currently no information documented on the homunculus. Go to the Rheumatology activity and complete the homunculus joint  exam.  Investigation: No additional findings.  Imaging: No results found.  Recent Labs: Lab Results  Component Value Date   WBC 4.0 05/03/2021   HGB 12.3 05/03/2021   PLT 236 05/03/2021   NA 142 05/03/2021   K 4.4 05/03/2021   CL 106 05/03/2021   CO2 28 05/03/2021   GLUCOSE 91 05/03/2021   BUN 12 05/03/2021   CREATININE 0.74 05/03/2021   BILITOT 0.3 05/03/2021   ALKPHOS 82 05/03/2021   AST 18 05/03/2021   ALT 20 05/03/2021   PROT 7.3 05/03/2021   ALBUMIN 4.0 05/03/2021   CALCIUM 9.6 05/03/2021   GFRAA 118 03/16/2021   QFTBGOLDPLUS NEGATIVE 10/13/2020    Speciality Comments: PLQ Eye Exam: 10/26/2020  WNL at [facility] Follow up in 1 year  Procedures:  No procedures performed Allergies: Aspirin, Augmentin [amoxicillin-pot clavulanate], and Peanut-containing drug products   Assessment / Plan:     Visit Diagnoses: No diagnosis found.  Orders: No orders of the defined types were placed in this encounter.  No orders of the defined types were placed in this encounter.   Face-to-face time spent with patient was *** minutes. Greater than 50% of time was spent in counseling and coordination of care.  Follow-Up Instructions: No follow-ups on file.   Earnestine Mealing, CMA  Note - This record has been created using Editor, commissioning.  Chart creation errors have been sought, but may not always  have been located. Such creation errors do not reflect on  the standard of medical care.

## 2021-06-05 ENCOUNTER — Other Ambulatory Visit: Payer: Self-pay | Admitting: Allergy

## 2021-06-14 ENCOUNTER — Ambulatory Visit: Payer: 59

## 2021-06-16 ENCOUNTER — Ambulatory Visit: Payer: 59 | Admitting: Rheumatology

## 2021-06-16 DIAGNOSIS — Z8719 Personal history of other diseases of the digestive system: Secondary | ICD-10-CM

## 2021-06-16 DIAGNOSIS — M65331 Trigger finger, right middle finger: Secondary | ICD-10-CM

## 2021-06-16 DIAGNOSIS — M8588 Other specified disorders of bone density and structure, other site: Secondary | ICD-10-CM

## 2021-06-16 DIAGNOSIS — R768 Other specified abnormal immunological findings in serum: Secondary | ICD-10-CM

## 2021-06-16 DIAGNOSIS — Z8659 Personal history of other mental and behavioral disorders: Secondary | ICD-10-CM

## 2021-06-16 DIAGNOSIS — Z8709 Personal history of other diseases of the respiratory system: Secondary | ICD-10-CM

## 2021-06-16 DIAGNOSIS — M797 Fibromyalgia: Secondary | ICD-10-CM

## 2021-06-16 DIAGNOSIS — Z79899 Other long term (current) drug therapy: Secondary | ICD-10-CM

## 2021-06-16 DIAGNOSIS — Z8619 Personal history of other infectious and parasitic diseases: Secondary | ICD-10-CM

## 2021-06-16 DIAGNOSIS — M0579 Rheumatoid arthritis with rheumatoid factor of multiple sites without organ or systems involvement: Secondary | ICD-10-CM

## 2021-06-16 DIAGNOSIS — R778 Other specified abnormalities of plasma proteins: Secondary | ICD-10-CM

## 2021-06-16 NOTE — Progress Notes (Deleted)
Office Visit Note  Patient: Julie [name]             Date of Birth: 11-15-1965           MRN: [id number]             PCP: Lin Landsman, MD Referring: Lin Landsman, MD Visit Date: 06/27/2021 Occupation: @GUAROCC @  Subjective:    History of Present Illness: Julie [name] is a 55 y.o. female with history of seropositive rheumatoid arthritis and fibromyalgia. She is on Orencia 125 mg sq injections every week, rasuvo 20 mg sq injections once weekly, and plaquenil 200 mg 1 tablet twice daily Monday through Friday.   CBC and CMP updated on 05/03/21. She will be due to update lab work in November and every 3 months.  Standing orders for CBC and CMP remain in place. TB gold negative on 10/13/20.  PLQ Eye Exam: 10/26/2020 WNL at [facility] Follow up in 1 year.   Activities of Daily Living:  Patient reports morning stiffness for *** {minute/hour:19697}.   Patient {ACTIONS;DENIES/REPORTS:21021675::"Denies"} nocturnal pain.  Difficulty dressing/grooming: {ACTIONS;DENIES/REPORTS:21021675::"Denies"} Difficulty climbing stairs: {ACTIONS;DENIES/REPORTS:21021675::"Denies"} Difficulty getting out of chair: {ACTIONS;DENIES/REPORTS:21021675::"Denies"} Difficulty using hands for taps, buttons, cutlery, and/or writing: {ACTIONS;DENIES/REPORTS:21021675::"Denies"}  No Rheumatology ROS completed.   PMFS History:  Patient Active Problem List   Diagnosis Date Noted   Anaphylactic shock due to adverse food reaction 02/01/2020   Moderate persistent asthma without complication 60/73/7106   Complication of anesthesia    Fibromyalgia 02/14/2018   History of gastroesophageal reflux (GERD) 02/14/2018   History of asthma 02/14/2018   Rheumatoid arthritis involving multiple sites with positive rheumatoid factor ([place]) 11/05/2016   High risk medication use 11/05/2016   LPRD (laryngopharyngeal reflux disease) 09/20/2015   Seasonal and perennial allergic rhinoconjunctivitis 09/20/2015   Acute sinusitis  07/29/2015   S/P total hysterectomy 05/25/2015    Past Medical History:  Diagnosis Date   Anemia    Asthma    Complication of anesthesia    Eczema    Esophageal dysphagia    GERD (gastroesophageal reflux disease)    PONV (postoperative nausea and vomiting)    Rheumatoid arthritis ([place])     Family History  Problem Relation Age of Onset   Asthma Mother    Eczema Mother    Sarcoidosis Mother    Hypertension Mother    Cancer Father    Hypotension Father    Congestive Heart Failure Father    Colon cancer Father    Multiple myeloma Father    Asthma Sister    GER disease Sister    Allergic rhinitis Neg Hx    Angioedema Neg Hx    Immunodeficiency Neg Hx    Urticaria Neg Hx    Esophageal cancer Neg Hx    Rectal cancer Neg Hx    Stomach cancer Neg Hx    Past Surgical History:  Procedure Laterality Date   ABDOMINAL HYSTERECTOMY     partial age 26   BILATERAL SALPINGECTOMY Bilateral 05/25/2015   Procedure: BILATERAL SALPINGECTOMY;  Surgeon: Servando Salina, MD;  Location: [facility];  Service: Gynecology;  Laterality: Bilateral;   CARPAL TUNNEL RELEASE Left 01/05/2020   Procedure: LEFT CARPAL TUNNEL RELEASE;  Surgeon: Daryll Brod, MD;  Location: [facility];  Service: Orthopedics;  Laterality: Left;  FOREARM BIER BLOCK   COLONOSCOPY     OVARIAN CYST REMOVAL Right 05/25/2015   Procedure: OVARIAN CYSTECTOMY;  Surgeon: Servando Salina, MD;  Location: [facility];  Service: Gynecology;  Laterality:  Right;   TONSILLECTOMY     TUBAL LIGATION     Social History   Social History Narrative   Not on file   Immunization History  Administered Date(s) Administered   Influenza Inj Mdck Quad Pf 10/19/2017   Influenza Split 11/25/2014   Influenza, Quadrivalent, Recombinant, Inj, Pf 08/26/2019   Influenza, Seasonal, Injecte, Preservative Fre 09/20/2015   Influenza,inj,Quad PF,6+ Mos 08/04/2018   Influenza-Unspecified 07/18/2017   PFIZER(Purple Top)SARS-COV-2 Vaccination  11/28/2019, 12/19/2019, 06/23/2020     Objective: Vital Signs: LMP 05/19/2015    Physical Exam Vitals and nursing note reviewed.  Constitutional:      Appearance: She is well-developed.  HENT:     Head: Normocephalic and atraumatic.  Eyes:     Conjunctiva/sclera: Conjunctivae normal.  Pulmonary:     Effort: Pulmonary effort is normal.  Abdominal:     Palpations: Abdomen is soft.  Musculoskeletal:     Cervical back: Normal range of motion.  Skin:    General: Skin is warm and dry.     Capillary Refill: Capillary refill takes less than 2 seconds.  Neurological:     Mental Status: She is alert and oriented to person, place, and time.  Psychiatric:        Behavior: Behavior normal.     Musculoskeletal Exam: ***  CDAI Exam: CDAI Score: -- Patient Global: --; Provider Global: -- Swollen: --; Tender: -- Joint Exam 06/27/2021   No joint exam has been documented for this visit   There is currently no information documented on the homunculus. Go to the Rheumatology activity and complete the homunculus joint exam.  Investigation: No additional findings.  Imaging: No results found.  Recent Labs: Lab Results  Component Value Date   WBC 4.0 05/03/2021   HGB 12.3 05/03/2021   PLT 236 05/03/2021   NA 142 05/03/2021   K 4.4 05/03/2021   CL 106 05/03/2021   CO2 28 05/03/2021   GLUCOSE 91 05/03/2021   BUN 12 05/03/2021   CREATININE 0.74 05/03/2021   BILITOT 0.3 05/03/2021   ALKPHOS 82 05/03/2021   AST 18 05/03/2021   ALT 20 05/03/2021   PROT 7.3 05/03/2021   ALBUMIN 4.0 05/03/2021   CALCIUM 9.6 05/03/2021   GFRAA 118 03/16/2021   QFTBGOLDPLUS NEGATIVE 10/13/2020    Speciality Comments: PLQ Eye Exam: 10/26/2020  WNL at [facility] Follow up in 1 year  Procedures:  No procedures performed Allergies: Aspirin, Augmentin [amoxicillin-pot clavulanate], and Peanut-containing drug products   Assessment / Plan:     Visit Diagnoses: Rheumatoid arthritis involving  multiple sites with positive rheumatoid factor ([facility])  High risk medication use  Fibromyalgia  Positive ANA (antinuclear antibody)  Anti-RNP antibodies present  Osteopenia of spine  History of Helicobacter pylori infection  History of gastroesophageal reflux (GERD)  History of asthma  History of depression  Abnormal SPEP  Orders: No orders of the defined types were placed in this encounter.  No orders of the defined types were placed in this encounter.   Face-to-face time spent with patient was *** minutes. Greater than 50% of time was spent in counseling and coordination of care.  Follow-Up Instructions: No follow-ups on file.   Ofilia Neas, PA-C  Note - This record has been created using Dragon software.  Chart creation errors have been sought, but may not always  have been located. Such creation errors do not reflect on  the standard of medical care.

## 2021-06-23 ENCOUNTER — Other Ambulatory Visit (HOSPITAL_BASED_OUTPATIENT_CLINIC_OR_DEPARTMENT_OTHER): Payer: Self-pay

## 2021-06-23 DIAGNOSIS — R5383 Other fatigue: Secondary | ICD-10-CM

## 2021-06-23 DIAGNOSIS — G4700 Insomnia, unspecified: Secondary | ICD-10-CM

## 2021-06-23 DIAGNOSIS — R0681 Apnea, not elsewhere classified: Secondary | ICD-10-CM

## 2021-06-27 ENCOUNTER — Ambulatory Visit: Payer: 59 | Admitting: Physician Assistant

## 2021-06-27 DIAGNOSIS — Z8659 Personal history of other mental and behavioral disorders: Secondary | ICD-10-CM

## 2021-06-27 DIAGNOSIS — M0579 Rheumatoid arthritis with rheumatoid factor of multiple sites without organ or systems involvement: Secondary | ICD-10-CM

## 2021-06-27 DIAGNOSIS — Z8709 Personal history of other diseases of the respiratory system: Secondary | ICD-10-CM

## 2021-06-27 DIAGNOSIS — M797 Fibromyalgia: Secondary | ICD-10-CM

## 2021-06-27 DIAGNOSIS — Z8719 Personal history of other diseases of the digestive system: Secondary | ICD-10-CM

## 2021-06-27 DIAGNOSIS — Z8619 Personal history of other infectious and parasitic diseases: Secondary | ICD-10-CM

## 2021-06-27 DIAGNOSIS — M8588 Other specified disorders of bone density and structure, other site: Secondary | ICD-10-CM

## 2021-06-27 DIAGNOSIS — R768 Other specified abnormal immunological findings in serum: Secondary | ICD-10-CM

## 2021-06-27 DIAGNOSIS — Z79899 Other long term (current) drug therapy: Secondary | ICD-10-CM

## 2021-06-27 DIAGNOSIS — R778 Other specified abnormalities of plasma proteins: Secondary | ICD-10-CM

## 2021-06-29 NOTE — Progress Notes (Signed)
Office Visit Note  Patient: Julie [name]             Date of Birth: 27-Nov-1965           MRN: [id number]             PCP: Lin Landsman, MD Referring: Lin Landsman, MD Visit Date: 06/30/2021 Occupation: @GUAROCC @  Subjective:  Sicca symptoms  History of Present Illness: Julie [name] is a 55 y.o. female with history of seropositive rheumatoid arthritis and fibromyalgia.  She is currently on Orencia 125 mg subcutaneous injections once weekly, Plaquenil 200 mg 1 tablet by mouth twice daily Monday through Friday, Rasuvo 20 mg sq injections once weekly, and folic acid 2 mg by mouth daily.  She has not missed any doses recently.  She denies any signs or symptoms of a RA flare recently.  She states she has occasional myalgias and muscle tenderness due to fibromyalgia. She has been experiencing increased sicca symptoms including dry mouth, dry eyes, dry skin, and vaginal dryness for the past 1 month.  She tried gel eyedrops for dry eyes and recently purchased Biotene oral rinse but has not noticed any improvement.  She is concerned about the diagnosis of Sjogren's syndrome.  She states that she recently was diagnosed with a sinus infection but was not given a prescription for antibiotics.  She took a Z-Pak which she had leftover at home but has still had some lingering symptoms.  She states she is also been experiencing symptoms of vertigo intermittently.  She states that she has been unable to ride in the passenger seat due to motion sickness.  She has not been evaluated by her PCP yet.    Activities of Daily Living:  Patient reports morning stiffness for 0 minutes.   Patient Denies nocturnal pain.  Difficulty dressing/grooming: Denies Difficulty climbing stairs: Denies Difficulty getting out of chair: Denies Difficulty using hands for taps, buttons, cutlery, and/or writing: Denies  Review of Systems  Constitutional:  Negative for fatigue.  HENT:  Positive for mouth dryness and nose  dryness. Negative for mouth sores.   Eyes:  Positive for pain, visual disturbance and dryness. Negative for itching.  Respiratory:  Negative for shortness of breath and difficulty breathing.   Cardiovascular:  Negative for chest pain and palpitations.  Gastrointestinal:  Negative for blood in stool, constipation and diarrhea.  Endocrine: Negative for increased urination.  Genitourinary:  Positive for vaginal dryness. Negative for difficulty urinating.  Musculoskeletal:  Positive for joint pain, joint pain, myalgias, muscle tenderness and myalgias. Negative for joint swelling and morning stiffness.  Skin:  Negative for color change, rash and redness.  Allergic/Immunologic: Negative for susceptible to infections.  Neurological:  Positive for dizziness and headaches. Negative for numbness, memory loss and weakness.  Hematological:  Negative for bruising/bleeding tendency.  Psychiatric/Behavioral:  Negative for confusion.    PMFS History:  Patient Active Problem List   Diagnosis Date Noted   Anaphylactic shock due to adverse food reaction 02/01/2020   Moderate persistent asthma without complication 79/11/8331   Complication of anesthesia    Fibromyalgia 02/14/2018   History of gastroesophageal reflux (GERD) 02/14/2018   History of asthma 02/14/2018   Rheumatoid arthritis involving multiple sites with positive rheumatoid factor ([place]) 11/05/2016   High risk medication use 11/05/2016   LPRD (laryngopharyngeal reflux disease) 09/20/2015   Seasonal and perennial allergic rhinoconjunctivitis 09/20/2015   Acute sinusitis 07/29/2015   S/P total hysterectomy 05/25/2015    Past Medical History:  Diagnosis  Date   Anemia    Asthma    Complication of anesthesia    Eczema    Esophageal dysphagia    GERD (gastroesophageal reflux disease)    PONV (postoperative nausea and vomiting)    Rheumatoid arthritis ([place])     Family History  Problem Relation Age of Onset   Asthma Mother    Eczema Mother     Sarcoidosis Mother    Hypertension Mother    Cancer Father    Hypotension Father    Congestive Heart Failure Father    Colon cancer Father    Multiple myeloma Father    Asthma Sister    GER disease Sister    Allergic rhinitis Neg Hx    Angioedema Neg Hx    Immunodeficiency Neg Hx    Urticaria Neg Hx    Esophageal cancer Neg Hx    Rectal cancer Neg Hx    Stomach cancer Neg Hx    Past Surgical History:  Procedure Laterality Date   ABDOMINAL HYSTERECTOMY     partial age 65   BILATERAL SALPINGECTOMY Bilateral 05/25/2015   Procedure: BILATERAL SALPINGECTOMY;  Surgeon: Servando Salina, MD;  Location: [facility];  Service: Gynecology;  Laterality: Bilateral;   CARPAL TUNNEL RELEASE Left 01/05/2020   Procedure: LEFT CARPAL TUNNEL RELEASE;  Surgeon: Daryll Brod, MD;  Location: [facility];  Service: Orthopedics;  Laterality: Left;  FOREARM BIER BLOCK   COLONOSCOPY     OVARIAN CYST REMOVAL Right 05/25/2015   Procedure: OVARIAN CYSTECTOMY;  Surgeon: Servando Salina, MD;  Location: [facility];  Service: Gynecology;  Laterality: Right;   TONSILLECTOMY     TUBAL LIGATION     Social History   Social History Narrative   Not on file   Immunization History  Administered Date(s) Administered   Influenza Inj Mdck Quad Pf 10/19/2017   Influenza Split 11/25/2014   Influenza, Quadrivalent, Recombinant, Inj, Pf 08/26/2019   Influenza, Seasonal, Injecte, Preservative Fre 09/20/2015   Influenza,inj,Quad PF,6+ Mos 08/04/2018   Influenza-Unspecified 07/18/2017   PFIZER(Purple Top)SARS-COV-2 Vaccination 11/28/2019, 12/19/2019, 06/23/2020     Objective: Vital Signs: BP 132/85 (BP Location: Right Arm, Patient Position: Sitting)   Pulse 71   Ht 5' 1.25" (1.556 m)   Wt 193 lb 3.2 oz (87.6 kg)   LMP 05/19/2015   BMI 36.21 kg/m    Physical Exam Vitals and nursing note reviewed.  Constitutional:      Appearance: She is well-developed.  HENT:     Head: Normocephalic and atraumatic.      Mouth/Throat:     Comments: No parotid swelling or tenderness noted. Eyes:     Conjunctiva/sclera: Conjunctivae normal.  Pulmonary:     Effort: Pulmonary effort is normal.  Abdominal:     Palpations: Abdomen is soft.  Musculoskeletal:     Cervical back: Normal range of motion.  Lymphadenopathy:     Cervical: No cervical adenopathy.  Skin:    General: Skin is warm and dry.     Capillary Refill: Capillary refill takes less than 2 seconds.  Neurological:     Mental Status: She is alert and oriented to person, place, and time.  Psychiatric:        Behavior: Behavior normal.     Musculoskeletal Exam: C-spine, thoracic spine, lumbar spine have good range of motion with no discomfort.  Shoulder joints have good range of motion with no discomfort.  Elbow joints have good range of motion with no tenderness or inflammation.  Tenderness and  mild synovitis on the ulnar aspect of the right wrist.  No tenderness or synovitis over MCP joints.  Complete fist formation bilaterally.  PIP and DIP thickening consistent with osteoarthritis of both hands.  Hip joints have good range of motion with no discomfort.  Knee joints have good range of motion with no warmth or effusion.  Ankle joints have good range of motion with some tenderness bilaterally.    CDAI Exam: CDAI Score: 0.8  Patient Global: 4 mm; Provider Global: 4 mm Swollen: 0 ; Tender: 0  Joint Exam 06/30/2021   No joint exam has been documented for this visit   There is currently no information documented on the homunculus. Go to the Rheumatology activity and complete the homunculus joint exam.  Investigation: No additional findings.  Imaging: No results found.  Recent Labs: Lab Results  Component Value Date   WBC 4.0 05/03/2021   HGB 12.3 05/03/2021   PLT 236 05/03/2021   NA 142 05/03/2021   K 4.4 05/03/2021   CL 106 05/03/2021   CO2 28 05/03/2021   GLUCOSE 91 05/03/2021   BUN 12 05/03/2021   CREATININE 0.74 05/03/2021    BILITOT 0.3 05/03/2021   ALKPHOS 82 05/03/2021   AST 18 05/03/2021   ALT 20 05/03/2021   PROT 7.3 05/03/2021   ALBUMIN 4.0 05/03/2021   CALCIUM 9.6 05/03/2021   GFRAA 118 03/16/2021   QFTBGOLDPLUS NEGATIVE 10/13/2020    Speciality Comments: PLQ Eye Exam: 10/26/2020  WNL at [facility] Follow up in 1 year  Procedures:  No procedures performed Allergies: Aspirin, Augmentin [amoxicillin-pot clavulanate], and Peanut-containing drug products   Assessment / Plan:     Visit Diagnoses: Rheumatoid arthritis involving multiple sites with positive rheumatoid factor ([place]): She has tenderness and mild synovitis over the ulnar aspect of the right wrist.  She is not experiencing any other joint pain or inflammation at this time.  She has intermittent myalgias and muscle tenderness due to fibromyalgia but overall her rheumatoid arthritis has been well controlled.  She is currently on Orencia 125 mg sq injections once weekly, Rasuvo 20 mg sq injections once weekly, and Plaquenil 200 mg 1 tablet by mouth twice daily Monday through Friday.  She is tolerating these medications without any side effects and has not missed any doses recently.  She will remain on the current treatment regimen.  She was advised to notify us if she develops increased joint pain or joint swelling.  She will follow-up in the office 3 months.   High risk medication use -CBC and CMP within normal limits on 05/03/2021.  She will be due to update lab work in November and every 3 months to monitor for drug toxicity.  Standing orders for CBC and CMP remain in place.  TB Gold negative on 10/13/2020 and will continue to be monitored yearly.  PLQ Eye Exam: 10/26/2020 WNL at [facility] Follow up in 1 year.  Plan: CBC with Differential/Platelet, COMPLETE METABOLIC PANEL WITH GFR, CBC with Differential/Platelet, COMPLETE METABOLIC PANEL WITH GFR Discussed the importance of holding Orencia and Rasuvo if she develops signs or symptoms of an infection and  to resume once the infection is completely cleared.  She voiced understanding. She reports having a recent sinus infection for which she took a leftover Z-Pak that she had at home.  Her symptoms have continued to linger.  She has noticed intermittent dizziness and headaches.  She was advised to schedule an appointment with her PCP today for further evaluation.  Advised to hold Orencia and Rasuvo since she is continuing to experience symptoms of sinusitis.  Fibromyalgia: She continues to experience intermittent fibromyalgia flares.  She has generalized hyperalgesia and some positive tender points on examination today.  Discussed the importance of regular exercise and good sleep hygiene.  Positive ANA (antinuclear antibody) -07-18-2020: ANA is positive-low, nonspecific titer. RNP remains positive: She has not been exhibiting any clinical features of systemic lupus in the past.  She has started to notice increased sicca symptoms including dry mouth, dry eyes, dry skin, and vaginal dryness.  The following lab work will be updated today.  Discussed the use of over-the-counter products including refresh gel drops, Biotene toothpaste and oral rinse, and XyliMelts.  I also discussed the importance of keeping her water intake up throughout the day and suggested using a humidifier in her bedroom at night.  Plan: Urinalysis, Routine w reflex microscopic, Anti-DNA antibody, double-stranded, Sedimentation rate, C3 and C4, RNP Antibody, Sjogrens syndrome-A extractable nuclear antibody, Sjogrens syndrome-B extractable nuclear antibody, Anti-Smith antibody, Beta-2 glycoprotein antibodies, Cardiolipin antibodies, IgG, IgM, IgA, Lupus Anticoagulant Eval w/Reflex, ANA  Anti-RNP antibodies present - RNP was 7.5 on Jul 18, 2020.  Discussed the possibility of next connective tissue disease.  She is been experiencing increased sicca symptoms but otherwise has not developed any new or worsening symptoms concerning for mixed connective  tissue disease.  The following lab work will be updated today.  Plan: Urinalysis, Routine w reflex microscopic, Anti-DNA antibody, double-stranded, Sedimentation rate, C3 and C4, RNP Antibody, Sjogrens syndrome-A extractable nuclear antibody, Sjogrens syndrome-B extractable nuclear antibody, Anti-Smith antibody, Beta-2 glycoprotein antibodies, Cardiolipin antibodies, IgG, IgM, IgA, Lupus Anticoagulant Eval w/Reflex, ANA  Sicca syndrome ([facility]) -She has been experiencing increased mouth dryness, eye dryness, skin dryness, and vaginal dryness over the past 1 month.  She has been unable to identify a trigger for her symptoms.  She has no parotid swelling or tenderness on examination today.  She recently purchased Biotene oral rinse but has not noticed much improvement in her symptoms.  She has been using over-the-counter eyedrops for dry eyes with minimal improvement in her symptoms.  Discussed the possibility of secondary Sjogren's syndrome.  We will obtain the following lab work today.  Discussed the use of over-the-counter products for symptomatic relief.  She was encouraged to try Systane or refresh eyedrops and continue to use Biotene oral rinse and to try XyliMelts.  I also discussed the use of a humidifier and to keep her water intake up throughout the day.  She would not be a good candidate for pilocarpine due to history of asthma.  She was advised to notify us if she develops any new or worsening symptoms.  Plan: Sedimentation rate, Sjogrens syndrome-A extractable nuclear antibody, Sjogrens syndrome-B extractable nuclear antibody, ANA  Osteopenia of spine: DEXA 08/24/2019: AP spine BMD 0.909 with T score -1.3.  She will be due to update DEXA this December.  A future order will be placed today.  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    Orders: Orders Placed This Encounter  Procedures   CBC with Differential/Platelet   COMPLETE METABOLIC PANEL WITH GFR   CBC with Differential/Platelet   COMPLETE METABOLIC PANEL WITH GFR   Urinalysis, Routine w reflex microscopic   Anti-DNA antibody, double-stranded  Sedimentation rate   C3 and C4   RNP Antibody   Sjogrens syndrome-A extractable nuclear antibody   Sjogrens syndrome-B extractable nuclear antibody   Anti-Smith antibody   Beta-2 glycoprotein antibodies   Cardiolipin antibodies, IgG, IgM, IgA   Lupus Anticoagulant Eval w/Reflex   ANA    No orders of the defined types were placed in this encounter.    Follow-Up Instructions: Return in about 3 months (around 09/30/2021) for Rheumatoid arthritis, Fibromyalgia.   Ofilia Neas, PA-C  Note - This record has been created using Dragon software.  Chart creation errors have been sought, but may not always  have been located. Such creation errors do not reflect on  the standard of medical care.

## 2021-06-30 ENCOUNTER — Encounter: Payer: Self-pay | Admitting: Physician Assistant

## 2021-06-30 ENCOUNTER — Ambulatory Visit: Payer: 59 | Admitting: Physician Assistant

## 2021-06-30 ENCOUNTER — Other Ambulatory Visit: Payer: Self-pay

## 2021-06-30 VITALS — BP 132/85 | HR 71 | Ht 61.25 in | Wt 193.2 lb

## 2021-06-30 DIAGNOSIS — R7689 Other specified abnormal immunological findings in serum: Secondary | ICD-10-CM

## 2021-06-30 DIAGNOSIS — M0579 Rheumatoid arthritis with rheumatoid factor of multiple sites without organ or systems involvement: Secondary | ICD-10-CM

## 2021-06-30 DIAGNOSIS — R768 Other specified abnormal immunological findings in serum: Secondary | ICD-10-CM | POA: Diagnosis not present

## 2021-06-30 DIAGNOSIS — R778 Other specified abnormalities of plasma proteins: Secondary | ICD-10-CM

## 2021-06-30 DIAGNOSIS — M3500 Sicca syndrome, unspecified: Secondary | ICD-10-CM

## 2021-06-30 DIAGNOSIS — M65331 Trigger finger, right middle finger: Secondary | ICD-10-CM

## 2021-06-30 DIAGNOSIS — Z8719 Personal history of other diseases of the digestive system: Secondary | ICD-10-CM

## 2021-06-30 DIAGNOSIS — Z8709 Personal history of other diseases of the respiratory system: Secondary | ICD-10-CM

## 2021-06-30 DIAGNOSIS — M797 Fibromyalgia: Secondary | ICD-10-CM

## 2021-06-30 DIAGNOSIS — Z79899 Other long term (current) drug therapy: Secondary | ICD-10-CM

## 2021-06-30 DIAGNOSIS — M8588 Other specified disorders of bone density and structure, other site: Secondary | ICD-10-CM

## 2021-06-30 DIAGNOSIS — Z8659 Personal history of other mental and behavioral disorders: Secondary | ICD-10-CM

## 2021-06-30 DIAGNOSIS — Z8619 Personal history of other infectious and parasitic diseases: Secondary | ICD-10-CM

## 2021-06-30 NOTE — Patient Instructions (Signed)
Standing Labs We placed an order today for your standing lab work.   Please have your standing labs drawn in January and every 3 months   If possible, please have your labs drawn 2 weeks prior to your appointment so that the provider can discuss your results at your appointment.  Please note that you may see your imaging and lab results in MyChart before we have reviewed them. We may be awaiting multiple results to interpret others before contacting you. Please allow our office up to 72 hours to thoroughly review all of the results before contacting the office for clarification of your results.  We have open lab daily: Monday through Thursday from 1:30-4:30 PM and Friday from 1:30-4:00 PM at the office of Dr. Shaili Deveshwar, [facility].   Please be advised, all patients with office appointments requiring lab work will take precedent over walk-in lab work.  If possible, please come for your lab work on Monday and Friday afternoons, as you may experience shorter wait times. The office is located at [address], [address], [place], [place] [address] No appointment is necessary.   Labs are drawn by [facility]. Please bring your co-pay at the time of your lab draw.  You may receive a bill from [facility] for your lab work.  If you wish to have your labs drawn at another location, please call the office 24 hours in advance to send orders.  If you have any questions regarding directions or hours of operation,  please call [phone].   As a reminder, please drink plenty of water prior to coming for your lab work. Thanks!  

## 2021-07-03 NOTE — Progress Notes (Signed)
Beta-2 glycoprotein negative and anti-cardiolipin antibodies negative.

## 2021-07-03 NOTE — Progress Notes (Signed)
CBC and CMP WNL.   UA revealed 1+ leukocytes.  Negative for nitrites and bacteria.  ESR WNL.  Complements WNL.  dsDNA is negative.  Smith ab, Ro antibody, and La antibody are negative.  RNP remains positive but is a lower titer.

## 2021-07-04 NOTE — Progress Notes (Signed)
ANA remains positive but is a low titer with nonspecific patterns.   Recommend repeating ANA and RNP in 1 year or sooner if she develops any new or worsening symptoms.  No medication changes are recommended at this time.

## 2021-07-05 ENCOUNTER — Other Ambulatory Visit: Payer: Self-pay | Admitting: *Deleted

## 2021-07-05 DIAGNOSIS — M3500 Sicca syndrome, unspecified: Secondary | ICD-10-CM

## 2021-07-05 DIAGNOSIS — M0579 Rheumatoid arthritis with rheumatoid factor of multiple sites without organ or systems involvement: Secondary | ICD-10-CM

## 2021-07-05 DIAGNOSIS — R768 Other specified abnormal immunological findings in serum: Secondary | ICD-10-CM

## 2021-07-05 DIAGNOSIS — R778 Other specified abnormalities of plasma proteins: Secondary | ICD-10-CM

## 2021-07-05 DIAGNOSIS — Z79899 Other long term (current) drug therapy: Secondary | ICD-10-CM

## 2021-07-05 LAB — URINALYSIS, ROUTINE W REFLEX MICROSCOPIC
Bacteria, UA: NONE SEEN /HPF
Bilirubin Urine: NEGATIVE
Glucose, UA: NEGATIVE
Hgb urine dipstick: NEGATIVE
Hyaline Cast: NONE SEEN /LPF
Ketones, ur: NEGATIVE
Nitrite: NEGATIVE
Protein, ur: NEGATIVE
RBC / HPF: NONE SEEN /HPF (ref 0–2)
Specific Gravity, Urine: 1.023 (ref 1.001–1.035)
pH: 5.5 (ref 5.0–8.0)

## 2021-07-05 LAB — CBC WITH DIFFERENTIAL/PLATELET
Absolute Monocytes: 224 cells/uL (ref 200–950)
Basophils Absolute: 60 cells/uL (ref 0–200)
Basophils Relative: 1.4 %
Eosinophils Absolute: 159 cells/uL (ref 15–500)
Eosinophils Relative: 3.7 %
HCT: 42 % (ref 35.0–45.0)
Hemoglobin: 13.7 g/dL (ref 11.7–15.5)
Lymphs Abs: 1763 cells/uL (ref 850–3900)
MCH: 28.3 pg (ref 27.0–33.0)
MCHC: 32.6 g/dL (ref 32.0–36.0)
MCV: 86.8 fL (ref 80.0–100.0)
MPV: 11.3 fL (ref 7.5–12.5)
Monocytes Relative: 5.2 %
Neutro Abs: 2094 cells/uL (ref 1500–7800)
Neutrophils Relative %: 48.7 %
Platelets: 240 10*3/uL (ref 140–400)
RBC: 4.84 10*6/uL (ref 3.80–5.10)
RDW: 11.7 % (ref 11.0–15.0)
Total Lymphocyte: 41 %
WBC: 4.3 10*3/uL (ref 3.8–10.8)

## 2021-07-05 LAB — BETA-2 GLYCOPROTEIN ANTIBODIES
Beta-2 Glyco 1 IgA: <2 U/mL
Beta-2 Glyco 1 IgM: <2 U/mL
Beta-2 Glyco I IgG: <2 U/mL

## 2021-07-05 LAB — CARDIOLIPIN ANTIBODIES, IGG, IGM, IGA
Anticardiolipin IgA: <2 APL-U/mL
Anticardiolipin IgG: <2 GPL-U/mL
Anticardiolipin IgM: <2 MPL-U/mL

## 2021-07-05 LAB — LUPUS ANTICOAGULANT EVAL W/ REFLEX
PTT-LA Screen: 36 s (ref <=40)
dRVVT: 32 s (ref <=45)

## 2021-07-05 LAB — COMPLETE METABOLIC PANEL WITH GFR
AG Ratio: 1.5 (calc) (ref 1.0–2.5)
ALT: 13 U/L (ref 6–29)
AST: 15 U/L (ref 10–35)
Albumin: 4.5 g/dL (ref 3.6–5.1)
Alkaline phosphatase (APISO): 89 U/L (ref 37–153)
BUN: 14 mg/dL (ref 7–25)
CO2: 29 mmol/L (ref 20–32)
Calcium: 9.5 mg/dL (ref 8.6–10.4)
Chloride: 104 mmol/L (ref 98–110)
Creat: 0.58 mg/dL (ref 0.50–1.03)
Globulin: 3 g/dL (calc) (ref 1.9–3.7)
Glucose, Bld: 103 mg/dL — ABNORMAL HIGH (ref 65–99)
Potassium: 4.6 mmol/L (ref 3.5–5.3)
Sodium: 140 mmol/L (ref 135–146)
Total Bilirubin: 0.4 mg/dL (ref 0.2–1.2)
Total Protein: 7.5 g/dL (ref 6.1–8.1)
eGFR: 109 mL/min/{1.73_m2} (ref >=60)

## 2021-07-05 LAB — ANTI-NUCLEAR AB-TITER (ANA TITER)
ANA TITER: 1:40 {titer} — ABNORMAL HIGH
ANA Titer 1: 1:80 {titer} — ABNORMAL HIGH

## 2021-07-05 LAB — C3 AND C4
C3 Complement: 176 mg/dL (ref 83–193)
C4 Complement: 36 mg/dL (ref 15–57)

## 2021-07-05 LAB — ANA: Anti Nuclear Antibody (ANA): POSITIVE — AB

## 2021-07-05 LAB — SEDIMENTATION RATE: Sed Rate: 9 mm/h (ref 0–30)

## 2021-07-05 LAB — SJOGRENS SYNDROME-B EXTRACTABLE NUCLEAR ANTIBODY: SSB (La) (ENA) Antibody, IgG: <1 AI

## 2021-07-05 LAB — ANTI-DNA ANTIBODY, DOUBLE-STRANDED: ds DNA Ab: <1 IU/mL

## 2021-07-05 LAB — ANTI-SMITH ANTIBODY: ENA SM Ab Ser-aCnc: <1 AI

## 2021-07-05 LAB — SJOGRENS SYNDROME-A EXTRACTABLE NUCLEAR ANTIBODY: SSA (Ro) (ENA) Antibody, IgG: <1 AI

## 2021-07-05 LAB — RNP ANTIBODY: Ribonucleic Protein(ENA) Antibody, IgG: 2.8 AI — AB

## 2021-07-06 NOTE — Progress Notes (Signed)
Lupus anticoagulant not detected.

## 2021-08-05 ENCOUNTER — Other Ambulatory Visit: Payer: Self-pay | Admitting: Physician Assistant

## 2021-08-05 DIAGNOSIS — M0579 Rheumatoid arthritis with rheumatoid factor of multiple sites without organ or systems involvement: Secondary | ICD-10-CM

## 2021-08-07 NOTE — Telephone Encounter (Signed)
Next Visit: 09/22/2021  Last Visit: 06/30/2021  Last Fill: 04/19/2021  DX: Rheumatoid arthritis involving multiple sites with positive rheumatoid factor   Current Dose per office note 06/30/2021: Orencia 125 mg sq injections once weekly    Labs: 06/30/2021 CBC and CMP WNL.  TB Gold: 10/13/2020 Neg   Okay to refill Rasuvo?

## 2021-08-17 NOTE — Telephone Encounter (Signed)
[facility] regarding patient's Rasuvo rx to clarify if copay was $90 after copay card savings. Per reps, patient's copay card was never communicated. They filled 3 month supply in July 2022 and most recently 08/14/21. Per rep, copay card information is not on rx notes though it was escribed. Patient did not communicate copay card information to pharmacy  Order # [id number]  Called patient to advise that copay card information was provided to pharmacy and they requested 2-3 business days for billing department to reprocess claims with copay card. Provider her [place] phone number to follow -up [phone]  Knox Saliva, PharmD, MPH, BCPS Clinical Pharmacist (Rheumatology and Pulmonology)

## 2021-08-30 ENCOUNTER — Ambulatory Visit (INDEPENDENT_AMBULATORY_CARE_PROVIDER_SITE_OTHER): Payer: 59

## 2021-08-30 ENCOUNTER — Other Ambulatory Visit: Payer: Self-pay

## 2021-08-30 DIAGNOSIS — J4540 Moderate persistent asthma, uncomplicated: Secondary | ICD-10-CM | POA: Diagnosis not present

## 2021-09-07 ENCOUNTER — Other Ambulatory Visit: Payer: Self-pay | Admitting: Physician Assistant

## 2021-09-07 NOTE — Telephone Encounter (Signed)
Next Visit: 09/22/2021  Last Visit: 06/30/2021  Labs: 06/30/2021 CBC and CMP WNL.    Eye exam:  10/26/2020  WNL    Current Dose per office note 06/30/2021: Plaquenil 200 mg 1 tablet by mouth twice daily Monday through Friday  YQ:MVHQIONGEX arthritis involving multiple sites with positive rheumatoid factor   Last Fill: 04/10/2021  Okay to refill Plaquenil?

## 2021-09-08 ENCOUNTER — Encounter (HOSPITAL_COMMUNITY): Payer: Self-pay

## 2021-09-08 ENCOUNTER — Other Ambulatory Visit: Payer: Self-pay

## 2021-09-08 ENCOUNTER — Ambulatory Visit (HOSPITAL_COMMUNITY)
Admission: EM | Admit: 2021-09-08 | Discharge: 2021-09-08 | Disposition: A | Discharge status: Home / Self Care | Payer: 59 | Attending: Emergency Medicine | Admitting: Emergency Medicine

## 2021-09-08 DIAGNOSIS — J4540 Moderate persistent asthma, uncomplicated: Secondary | ICD-10-CM | POA: Insufficient documentation

## 2021-09-08 DIAGNOSIS — R519 Headache, unspecified: Secondary | ICD-10-CM | POA: Insufficient documentation

## 2021-09-08 DIAGNOSIS — R0981 Nasal congestion: Secondary | ICD-10-CM | POA: Insufficient documentation

## 2021-09-08 DIAGNOSIS — U071 COVID-19: Secondary | ICD-10-CM | POA: Diagnosis not present

## 2021-09-08 DIAGNOSIS — B349 Viral infection, unspecified: Secondary | ICD-10-CM | POA: Diagnosis not present

## 2021-09-08 LAB — POC INFLUENZA A AND B ANTIGEN (URGENT CARE ONLY)
INFLUENZA A ANTIGEN, POC: NEGATIVE
INFLUENZA B ANTIGEN, POC: NEGATIVE

## 2021-09-08 NOTE — ED Triage Notes (Signed)
Pt c/o cough, nasal congestion, sinus pressure, and headache since last night.

## 2021-09-08 NOTE — ED Provider Notes (Signed)
Julie [name]    CSN: [id number] Arrival date & time: 09/08/21  1349      History   Chief Complaint Chief Complaint  Patient presents with   Cough    HPI Julie [name] is a 55 y.o. female.   55 year old female,Julie [name], presents to urgent care chief complaint of cough, nasal congestion, sinus pressure,headache since last night.  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  Patient has had her COVID vaccination has not had her flu vaccination.  Patient took Tylenol prior to arrival.  The history is provided by the patient. No language interpreter was used.   Past Medical History:  Diagnosis Date   Anemia    Asthma    Complication of anesthesia    Eczema    Esophageal dysphagia    GERD (gastroesophageal reflux disease)    PONV (postoperative nausea and vomiting)    Rheumatoid arthritis ([place])     Patient Active Problem List   Diagnosis Date Noted   Viral illness 09/08/2021   Anaphylactic shock due to adverse food reaction 02/01/2020   Moderate persistent asthma without complication 09/05/7587   Complication of anesthesia    Fibromyalgia 02/14/2018   History of gastroesophageal reflux (GERD) 02/14/2018   History of asthma 02/14/2018   Rheumatoid arthritis involving multiple sites with positive rheumatoid factor ([place]) 11/05/2016   High risk medication use 11/05/2016   LPRD (laryngopharyngeal reflux disease) 09/20/2015   Seasonal and perennial allergic rhinoconjunctivitis 09/20/2015   Acute sinusitis 07/29/2015   S/P total hysterectomy 05/25/2015    Past Surgical History:  Procedure Laterality Date   ABDOMINAL HYSTERECTOMY     partial age 36   BILATERAL SALPINGECTOMY Bilateral 05/25/2015   Procedure: BILATERAL SALPINGECTOMY;  Surgeon: Servando Salina, MD;  Location: [facility];  Service: Gynecology;  Laterality: Bilateral;   CARPAL TUNNEL RELEASE Left 01/05/2020   Procedure: LEFT CARPAL  TUNNEL RELEASE;  Surgeon: Daryll Brod, MD;  Location: [facility];  Service: Orthopedics;  Laterality: Left;  FOREARM BIER BLOCK   COLONOSCOPY     OVARIAN CYST REMOVAL Right 05/25/2015   Procedure: OVARIAN CYSTECTOMY;  Surgeon: Servando Salina, MD;  Location: [facility];  Service: Gynecology;  Laterality: Right;   TONSILLECTOMY     TUBAL LIGATION      OB History   No obstetric history on file.      Home Medications    Prior to Admission medications   Medication Sig Start Date End Date Taking? Authorizing Provider  albuterol (PROAIR HFA) 108 (90 Base) MCG/ACT inhaler Inhale 2 puffs into the lungs every 4 (four) hours as needed. 03/29/21   Ambs, Kathrine Cords, FNP  B-D TB SYRINGE 1CC/27GX1/2" 27G X 1/2" 1 ML MISC Use to inject Methotrexate weekly. 01/30/21   Bo Merino, MD  cetirizine (ZYRTEC) 10 MG tablet Zyrtec 10 mg tablet 03/29/21   Ambs, Kathrine Cords, FNP  diclofenac Sodium (VOLTAREN) 1 % GEL Apply 2g to 4g to affected area up to 4 times daily as needed. 10/29/19   Ofilia Neas, PA-C  DUPIXENT 300 MG/2ML prefilled syringe INJECT $RemoveBefore'300MG'jjPPbHgViausi$  SUBCUTANEOUSLY EVERY OTHER WEEK 12/12/20   Valentina Shaggy, MD  EPINEPHrine 0.3 mg/0.3 mL IJ SOAJ injection Inject 0.3 mLs (0.3 mg total) into the muscle as needed for anaphylaxis. 11/17/19   Valentina Shaggy, MD  escitalopram (LEXAPRO) 20 MG tablet Take 20 mg by mouth daily. 01/14/17   [provider]  famotidine (PEPCID)  40 MG tablet Take 40 mg by mouth daily. Patient not taking: Reported on 06/30/2021 10/09/20   [provider]  folic acid (FOLVITE) 1 MG tablet Take 1 tablet (1 mg total) by mouth 2 (two) times daily. 01/30/21   Bo Merino, MD  hydroxychloroquine (PLAQUENIL) 200 MG tablet TAKE 1 TABLET TWICE A DAY MONDAY -FRIDAY 09/07/21   Bo Merino, MD  montelukast (SINGULAIR) 10 MG tablet Take 10 mg by mouth daily. 08/27/19   [provider]  Multiple Vitamins-Minerals (DAILY MULTIVITAMIN PO) Take 1  tablet by mouth daily.     [provider]  Nutritional Supplements (ESTROVEN PO) Take by mouth.    [provider]  omeprazole (PRILOSEC) 40 MG capsule TAKE ONE CAPSULE BY MOUTH EVERY MORNING 06/05/21   Ambs, Kathrine Cords, FNP  ORENCIA CLICKJECT 195 MG/ML SOAJ INJECT $RemoveBef'125MG'IcmBqbvtlF$  SUBCUTANEOUSLY WEEKLY 08/07/21   Ofilia Neas, PA-C  RASUVO 20 MG/0.4ML SOAJ INJECT $RemoveBef'20MG'aSwZrJgsDb$  SUBCUTANEOUSLY  ONCE WEEKLY 05/30/21   Bo Merino, MD    Family History Family History  Problem Relation Age of Onset   Asthma Mother    Eczema Mother    Sarcoidosis Mother    Hypertension Mother    Cancer Father    Hypotension Father    Congestive Heart Failure Father    Colon cancer Father    Multiple myeloma Father    Asthma Sister    GER disease Sister    Allergic rhinitis Neg Hx    Angioedema Neg Hx    Immunodeficiency Neg Hx    Urticaria Neg Hx    Esophageal cancer Neg Hx    Rectal cancer Neg Hx    Stomach cancer Neg Hx     Social History Social History   Tobacco Use   Smoking status: Never   Smokeless tobacco: Never  Vaping Use   Vaping Use: Never used  Substance Use Topics   Alcohol use: No   Drug use: No     Allergies   Aspirin, Augmentin [amoxicillin-pot clavulanate], and Peanut-containing drug products   Review of Systems Review of Systems  Constitutional:  Positive for fever.  HENT:  Positive for congestion and sinus pressure.   Respiratory:  Positive for cough.   All other systems reviewed and are negative.   Physical Exam Triage Vital Signs ED Triage Vitals [09/08/21 1429]  Enc Vitals Group     BP 133/80     Pulse Rate 72     Resp 18     Temp 99.6 F (37.6 C)     Temp Source Oral     SpO2 95 %     Weight      Height      Head Circumference      Peak Flow      Pain Score 8     Pain Loc      Pain Edu?      Excl. in [place]?    No data found.  Updated Vital Signs BP 133/80 (BP Location: Right Arm)    Pulse 72    Temp 99.6 F (37.6 C) (Oral)    Resp 18     LMP 05/19/2015    SpO2 95%   Visual Acuity Right Eye Distance:   Left Eye Distance:   Bilateral Distance:    Right Eye Near:   Left Eye Near:    Bilateral Near:     Physical Exam Vitals and nursing note reviewed.  Constitutional:      General: She is  not in acute distress.    Appearance: She is well-developed.  HENT:     Head: Normocephalic.     Right Ear: Tympanic membrane is retracted.     Left Ear: Tympanic membrane is retracted.     Nose: Congestion present.     Mouth/Throat:     Lips: Pink.     Mouth: Mucous membranes are moist.     Pharynx: Oropharynx is clear.  Eyes:     General: Lids are normal.     Conjunctiva/sclera: Conjunctivae normal.     Pupils: Pupils are equal, round, and reactive to light.  Neck:     Trachea: No tracheal deviation.  Cardiovascular:     Rate and Rhythm: Normal rate and regular rhythm.     Pulses: Normal pulses.     Heart sounds: Normal heart sounds. No murmur heard. Pulmonary:     Effort: Pulmonary effort is normal.     Breath sounds: Normal breath sounds and air entry.  Abdominal:     General: Bowel sounds are normal.     Palpations: Abdomen is soft.     Tenderness: There is no abdominal tenderness.  Musculoskeletal:        General: Normal range of motion.     Cervical back: Normal range of motion.  Lymphadenopathy:     Cervical: No cervical adenopathy.  Skin:    General: Skin is warm and dry.     Findings: No rash.  Neurological:     General: No focal deficit present.     Mental Status: She is alert and oriented to person, place, and time.     GCS: GCS eye subscore is 4. GCS verbal subscore is 5. GCS motor subscore is 6.  Psychiatric:        Attention and Perception: Attention normal.        Mood and Affect: Mood normal.        Speech: Speech normal.        Behavior: Behavior normal. Behavior is cooperative.     UC Treatments / Results  Labs (all labs ordered are listed, but only abnormal results are displayed) Labs  Reviewed  SARS CORONAVIRUS 2 (TAT 6-24 HRS)  POC INFLUENZA A AND B ANTIGEN (URGENT CARE ONLY)    EKG   Radiology No results found.  Procedures Procedures (including critical care time)  Medications Ordered in UC Medications - No data to display  Initial Impression / Assessment and Plan / UC Course  I have reviewed the triage vital signs and the nursing notes.  Pertinent labs & imaging results that were available during my care of the patient were reviewed by me and considered in my medical decision making (see chart for details).     Ddx: Influenza, viral illness, allergies Final Clinical Impressions(s) / UC Diagnoses   Final diagnoses:  Viral illness     Discharge Instructions      Your flu test was negative. Most likely this is a viral illness and will need to run its course, no antibiotics or tamiflu needed at today's visit. We will test you for covid, please quarantine until results known, check my chart for results. Rest, push fluids, may alternate Tylenol ibuprofen as label directed.  Follow-up with PCP in 2 to 3 days if no improvement.  If worsening symptoms go to the ER(chest pain, shortness of breath, etc)     ED Prescriptions   None    PDMP not reviewed this encounter.   Tori Milks, NP  09/08/21 1918 ° °

## 2021-09-08 NOTE — Progress Notes (Signed)
Office Visit Note  Patient: Julie [name]             Date of Birth: Aug 15, 1966           MRN: [id number]             PCP: Lin Landsman, MD Referring: Lin Landsman, MD Visit Date: 09/22/2021 Occupation: @GUAROCC @  Subjective:  Medication management    History of Present Illness: Julie [name] is a 55 y.o. female with a history of rheumatoid arthritis.  She states she developed COVID on December 23.  She was seen at the urgent care and was given back Slo-Bid and prednisone.  She states she continues to have wheezing.  She was also given Tessalon Perles and some inhalers.  She is still using inhaler 3 times a day.  She states she has not completely recovered from the chest infection.  She continued Plaquenil but she has been holding Orencia and methotrexate.  She has not noticed any flare of her joint symptoms.  She has been experiencing some discomfort in her ankles.  She had a flare of fibromyalgia which is improving gradually.  Activities of Daily Living:  Patient reports morning stiffness for 0  none .   Patient Denies nocturnal pain.  Difficulty dressing/grooming: Denies Difficulty climbing stairs: Denies Difficulty getting out of chair: Denies Difficulty using hands for taps, buttons, cutlery, and/or writing: Denies  Review of Systems  Constitutional:  Positive for fatigue. Negative for night sweats, weight gain and weight loss.  HENT:  Positive for mouth dryness. Negative for mouth sores, trouble swallowing, trouble swallowing and nose dryness.   Eyes:  Positive for dryness. Negative for pain, redness and visual disturbance.  Respiratory:  Positive for shortness of breath. Negative for cough and difficulty breathing.   Cardiovascular:  Negative for chest pain, palpitations, hypertension, irregular heartbeat and swelling in legs/feet.  Gastrointestinal:  Negative for blood in stool, constipation and diarrhea.  Endocrine: Positive for heat intolerance, excessive thirst  and increased urination.  Genitourinary:  Negative for difficulty urinating and vaginal dryness.  Musculoskeletal:  Positive for joint pain and joint pain. Negative for gait problem, joint swelling, myalgias, muscle weakness, morning stiffness, muscle tenderness and myalgias.  Skin:  Negative for color change, rash, hair loss, skin tightness, ulcers and sensitivity to sunlight.  Allergic/Immunologic: Positive for susceptible to infections.  Neurological:  Negative for dizziness, numbness, memory loss, night sweats and weakness.  Hematological:  Negative for bruising/bleeding tendency and swollen glands.  Psychiatric/Behavioral:  Negative for depressed mood and sleep disturbance. The patient is not nervous/anxious.    PMFS History:  Patient Active Problem List   Diagnosis Date Noted   Viral illness 09/08/2021   Anaphylactic shock due to adverse food reaction 02/01/2020   Moderate persistent asthma without complication 19/62/2297   Complication of anesthesia    Fibromyalgia 02/14/2018   History of gastroesophageal reflux (GERD) 02/14/2018   History of asthma 02/14/2018   Rheumatoid arthritis involving multiple sites with positive rheumatoid factor ([place]) 11/05/2016   High risk medication use 11/05/2016   LPRD (laryngopharyngeal reflux disease) 09/20/2015   Seasonal and perennial allergic rhinoconjunctivitis 09/20/2015   Acute sinusitis 07/29/2015   S/P total hysterectomy 05/25/2015    Past Medical History:  Diagnosis Date   Anemia    Asthma    Complication of anesthesia    Eczema    Esophageal dysphagia    GERD (gastroesophageal reflux disease)    PONV (postoperative nausea and vomiting)    Rheumatoid  arthritis ([employer])     Family History  Problem Relation Age of Onset   Asthma Mother    Eczema Mother    Sarcoidosis Mother    Hypertension Mother    Cancer Father    Hypotension Father    Congestive Heart Failure Father    Colon cancer Father    Multiple myeloma Father     Asthma Sister    GER disease Sister    Allergic rhinitis Neg Hx    Angioedema Neg Hx    Immunodeficiency Neg Hx    Urticaria Neg Hx    Esophageal cancer Neg Hx    Rectal cancer Neg Hx    Stomach cancer Neg Hx    Past Surgical History:  Procedure Laterality Date   ABDOMINAL HYSTERECTOMY     partial age 26   BILATERAL SALPINGECTOMY Bilateral 05/25/2015   Procedure: BILATERAL SALPINGECTOMY;  Surgeon: Servando Salina, MD;  Location: [facility];  Service: Gynecology;  Laterality: Bilateral;   CARPAL TUNNEL RELEASE Left 01/05/2020   Procedure: LEFT CARPAL TUNNEL RELEASE;  Surgeon: Daryll Brod, MD;  Location: [facility];  Service: Orthopedics;  Laterality: Left;  FOREARM BIER BLOCK   COLONOSCOPY     OVARIAN CYST REMOVAL Right 05/25/2015   Procedure: OVARIAN CYSTECTOMY;  Surgeon: Servando Salina, MD;  Location: [facility];  Service: Gynecology;  Laterality: Right;   TONSILLECTOMY     TUBAL LIGATION     Social History   Social History Narrative   Not on file   Immunization History  Administered Date(s) Administered   Influenza Inj Mdck Quad Pf 10/19/2017   Influenza Split 11/25/2014   Influenza, Quadrivalent, Recombinant, Inj, Pf 08/26/2019   Influenza, Seasonal, Injecte, Preservative Fre 09/20/2015   Influenza,inj,Quad PF,6+ Mos 08/04/2018   Influenza-Unspecified 07/18/2017   PFIZER(Purple Top)SARS-COV-2 Vaccination 11/28/2019, 12/19/2019, 06/23/2020     Objective: Vital Signs: BP 126/88 (BP Location: Left Arm, Patient Position: Sitting, Cuff Size: Small)    Pulse 79    Resp 13    Ht 5' 1.25" (1.556 m)    Wt 188 lb 12.8 oz (85.6 kg)    LMP 05/19/2015    BMI 35.38 kg/m    Physical Exam Vitals and nursing note reviewed.  Constitutional:      Appearance: She is well-developed.  HENT:     Head: Normocephalic and atraumatic.  Eyes:     Conjunctiva/sclera: Conjunctivae normal.  Cardiovascular:     Rate and Rhythm: Normal rate and regular rhythm.     Heart sounds:  Normal heart sounds.  Pulmonary:     Effort: Pulmonary effort is normal.     Breath sounds: Normal breath sounds.  Abdominal:     General: Bowel sounds are normal.     Palpations: Abdomen is soft.  Musculoskeletal:     Cervical back: Normal range of motion.  Lymphadenopathy:     Cervical: No cervical adenopathy.  Skin:    General: Skin is warm and dry.     Capillary Refill: Capillary refill takes less than 2 seconds.  Neurological:     Mental Status: She is alert and oriented to person, place, and time.  Psychiatric:        Behavior: Behavior normal.     Musculoskeletal Exam: C-spine was in good range of motion.  Shoulder joints, elbow joints, wrist joints, MCPs PIPs and DIPs with good range of motion with no synovitis.  Hip joints, knee joints, ankles, MTPs and PIPs with good range of motion with no synovitis.  CDAI Exam: CDAI Score: 0.4  Patient Global: 2 mm; Provider Global: 2 mm Swollen: 0 ; Tender: 0  Joint Exam 09/22/2021   No joint exam has been documented for this visit   There is currently no information documented on the homunculus. Go to the Rheumatology activity and complete the homunculus joint exam.  Investigation: No additional findings.  Imaging: No results found.  Recent Labs: Lab Results  Component Value Date   WBC 4.3 06/30/2021   HGB 13.7 06/30/2021   PLT 240 06/30/2021   NA 140 06/30/2021   K 4.6 06/30/2021   CL 104 06/30/2021   CO2 29 06/30/2021   GLUCOSE 103 (H) 06/30/2021   BUN 14 06/30/2021   CREATININE 0.58 06/30/2021   BILITOT 0.4 06/30/2021   ALKPHOS 82 05/03/2021   AST 15 06/30/2021   ALT 13 06/30/2021   PROT 7.5 06/30/2021   ALBUMIN 4.0 05/03/2021   CALCIUM 9.5 06/30/2021   GFRAA 118 03/16/2021   QFTBGOLDPLUS NEGATIVE 10/13/2020    Speciality Comments: PLQ Eye Exam: 10/26/2020  WNL at [facility] Follow up in 1 year  Procedures:  No procedures performed Allergies: Aspirin, Augmentin [amoxicillin-pot clavulanate], and  Peanut-containing drug products   Assessment / Plan:     Visit Diagnoses: Rheumatoid arthritis involving multiple sites with positive rheumatoid factor ([place])- patient had no synovitis on examination.  She has been holding Orencia and methotrexate due to recent COVID-19 infection.She still has cough and congestion.  Patient was advised to to restart Orencia and Rasuvo 2 weeks after she becomes asymptomatic.  Patient should notify us if she develops a flare.  High risk medication use - Orencia 125 mg sq injections once weekly, Rasuvo 20 mg sq injections once weekly, and Plaquenil 200 mg 1 tablet by mouth twice daily Monday through Friday.  - Plan: CBC with Differential/Platelet, COMPLETE METABOLIC PANEL WITH GFR, QuantiFERON-TB Gold Plus today and then every 3 months to monitor for drug toxicity.  Information regarding immunization was placed in the AVS.  She was advised to hold Orencia and methotrexate in case she develops an infection and resume after the infection resolves.  Fibromyalgia-she had a flare of fibromyalgia after she developed COVID-19 infection.  Symptoms are gradually resolving.  She had few tender points on my exam today.  Stretching exercises were advised.  Positive ANA (antinuclear antibody) - 07/13/20: ANA is positive-low, nonspecific titer. RNP remains positive: There is no history of oral ulcers, nasal ulcers, malar rash, photosensitivity, Raynaud's or lymphadenopathy.  Anti-RNP antibodies present-no clinical features of mixed connective tissue disease.  Sicca syndrome (HCC)-she has mild sicca symptoms which are manageable with over-the-counter medications.  Osteopenia of spine - DEXA 08/24/2019: AP spine BMD 0.909 with T score -1.3.  Use of calcium rich diet, vitamin D supplement and regular exercise was emphasized.  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   Orders: Orders Placed This Encounter  Procedures   CBC with Differential/Platelet   COMPLETE METABOLIC PANEL WITH GFR   QuantiFERON-TB Gold Plus   No orders of the defined types were placed in this encounter.    Follow-Up Instructions: Return in about 5 months (around 02/20/2022) for Rheumatoid arthritis.   Bo Merino, MD  Note - This record has been created using Editor, commissioning.  Chart creation errors have been sought, but may not  always  have been located. Such creation errors do not reflect on  the standard of medical care.

## 2021-09-08 NOTE — Discharge Instructions (Addendum)
Your flu test was negative. Most likely this is a viral illness and will need to run its course, no antibiotics or tamiflu needed at today's visit. We will test you for covid, please quarantine until results known, check my chart for results. Rest, push fluids, may alternate Tylenol ibuprofen as label directed.  Follow-up with PCP in 2 to 3 days if no improvement.  If worsening symptoms go to the ER(chest pain, shortness of breath, etc)

## 2021-09-09 LAB — SARS CORONAVIRUS 2 (TAT 6-24 HRS): SARS Coronavirus 2: POSITIVE — AB

## 2021-09-12 ENCOUNTER — Telehealth: Payer: Self-pay | Admitting: Internal Medicine

## 2021-09-12 MED ORDER — BENZONATATE 100 MG PO CAPS: 100.0000 mg | ORAL_CAPSULE | Freq: Three times a day (TID) | ORAL | 0 refills | Status: DC | PRN

## 2021-09-12 MED ORDER — NIRMATRELVIR/RITONAVIR (PAXLOVID)TABLET
3.0000 | ORAL_TABLET | Freq: Two times a day (BID) | ORAL | 0 refills | Status: AC
Start: 2021-09-12 — End: 2021-09-17

## 2021-09-12 MED ORDER — PREDNISONE 20 MG PO TABS: 20.0000 mg | ORAL_TABLET | Freq: Every day | ORAL | 0 refills | Status: AC

## 2021-09-12 NOTE — Telephone Encounter (Signed)
The following prescriptions were sent to the pharmacy 1.  Paxlovid 2.  Tessalon Perles 3.  Prednisone Patient was given return precautions.  She is currently experiencing some chest tightness but denies any audible wheezing.

## 2021-09-13 ENCOUNTER — Ambulatory Visit: Payer: 59

## 2021-09-22 ENCOUNTER — Ambulatory Visit: Payer: 59 | Admitting: Rheumatology

## 2021-09-22 ENCOUNTER — Encounter: Payer: Self-pay | Admitting: Rheumatology

## 2021-09-22 ENCOUNTER — Other Ambulatory Visit: Payer: Self-pay

## 2021-09-22 VITALS — BP 126/88 | HR 79 | Resp 13 | Ht 61.25 in | Wt 188.8 lb

## 2021-09-22 DIAGNOSIS — Z79899 Other long term (current) drug therapy: Secondary | ICD-10-CM | POA: Diagnosis not present

## 2021-09-22 DIAGNOSIS — M0579 Rheumatoid arthritis with rheumatoid factor of multiple sites without organ or systems involvement: Secondary | ICD-10-CM | POA: Diagnosis not present

## 2021-09-22 DIAGNOSIS — M3500 Sicca syndrome, unspecified: Secondary | ICD-10-CM

## 2021-09-22 DIAGNOSIS — Z8659 Personal history of other mental and behavioral disorders: Secondary | ICD-10-CM

## 2021-09-22 DIAGNOSIS — R7689 Other specified abnormal immunological findings in serum: Secondary | ICD-10-CM

## 2021-09-22 DIAGNOSIS — Z8719 Personal history of other diseases of the digestive system: Secondary | ICD-10-CM

## 2021-09-22 DIAGNOSIS — Z8619 Personal history of other infectious and parasitic diseases: Secondary | ICD-10-CM

## 2021-09-22 DIAGNOSIS — R768 Other specified abnormal immunological findings in serum: Secondary | ICD-10-CM | POA: Diagnosis not present

## 2021-09-22 DIAGNOSIS — M797 Fibromyalgia: Secondary | ICD-10-CM

## 2021-09-22 DIAGNOSIS — M8588 Other specified disorders of bone density and structure, other site: Secondary | ICD-10-CM

## 2021-09-22 DIAGNOSIS — Z8709 Personal history of other diseases of the respiratory system: Secondary | ICD-10-CM

## 2021-09-22 DIAGNOSIS — R778 Other specified abnormalities of plasma proteins: Secondary | ICD-10-CM

## 2021-09-22 NOTE — Patient Instructions (Addendum)
Standing Labs We placed an order today for your standing lab work.   Please have your standing labs drawn in April and every 3 months  If possible, please have your labs drawn 2 weeks prior to your appointment so that the provider can discuss your results at your appointment.  Please note that you may see your imaging and lab results in [facility] before we have reviewed them. We may be awaiting multiple results to interpret others before contacting you. Please allow our office up to 72 hours to thoroughly review all of the results before contacting the office for clarification of your results.  We have open lab daily: Monday through Thursday from 1:30-4:30 PM and Friday from 1:30-4:00 PM at the office of Dr. Bo Merino, [facility].   Please be advised, all patients with office appointments requiring lab work will take precedent over walk-in lab work.  If possible, please come for your lab work on Monday and Friday afternoons, as you may experience shorter wait times. The office is located at [address] [place], [place], [place] [address] No appointment is necessary.   Labs are drawn by [facility]. Please bring your co-pay at the time of your lab draw.  You may receive a bill from [facility] for your lab work.  If you wish to have your labs drawn at another location, please call the office 24 hours in advance to send orders.  If you have any questions regarding directions or hours of operation,  please call [phone].   As a reminder, please drink plenty of water prior to coming for your lab work. Thanks!   Vaccines You are taking a medication(s) that can suppress your immune system.  The following immunizations are recommended: Flu annually Covid-19  Td/Tdap (tetanus, diphtheria, pertussis) every 10 years Pneumonia (Prevnar 15 then Pneumovax 23 at least 1 year apart.  Alternatively, can take Prevnar 20 without needing additional dose) Shingrix: 2 doses from 4 weeks  to 6 months apart  Please check with your PCP to make sure you are up to date.   COVID-19 vaccine recommendations:   COVID-19 vaccine is recommended for everyone (unless you are allergic to a vaccine component), even if you are on a medication that suppresses your immune system.   If you are on Methotrexate, Cellcept (mycophenolate), Rinvoq, Morrie Sheldon, and Olumiant- hold the medication for 1 week after each vaccine. Hold Methotrexate for 2 weeks after the single dose COVID-19 vaccine.   If you are on Orencia subcutaneous injection - hold medication one week prior to and one week after the first COVID-19 vaccine dose (only).   Do not take Tylenol or any anti-inflammatory medications (NSAIDs) 24 hours prior to the COVID-19 vaccination.   There is no direct evidence about the efficacy of the COVID-19 vaccine in individuals who are on medications that suppress the immune system.   Even if you are fully vaccinated, and you are on any medications that suppress your immune system, please continue to wear a mask, maintain at least six feet social distance and practice hand hygiene.   If you develop a COVID-19 infection, please contact your PCP or our office to determine if you need monoclonal antibody infusion.  The booster vaccine is now available for immunocompromised patients.   Please see the following web sites for updated information.   [email or web address]-Patient-[email or web address]  If you have signs or symptoms of an infection or start antibiotics: First, call your PCP for workup of your infection. Hold your medication through the  infection, until you complete your antibiotics, and until symptoms resolve if you take the following: Injectable medication (Actemra, Benlysta, Cimzia, Cosentyx, Enbrel, Humira, Kevzara, Orencia, Remicade, Simponi, Stelara, Taltz, Tremfya) Methotrexate Leflunomide (Arava) Mycophenolate (Cellcept) Morrie Sheldon, Olumiant, or  Rinvoq

## 2021-09-24 NOTE — Progress Notes (Signed)
CBC and CMP are normal.

## 2021-09-26 LAB — QUANTIFERON-TB GOLD PLUS
Mitogen-NIL: >10 IU/mL
NIL: 0.05 IU/mL
QuantiFERON-TB Gold Plus: NEGATIVE
TB1-NIL: 0.01 IU/mL
TB2-NIL: <0 IU/mL

## 2021-09-26 LAB — CBC WITH DIFFERENTIAL/PLATELET
Absolute Monocytes: 420 cells/uL (ref 200–950)
Basophils Absolute: 42 cells/uL (ref 0–200)
Basophils Relative: 1 %
Eosinophils Absolute: 130 cells/uL (ref 15–500)
Eosinophils Relative: 3.1 %
HCT: 40.7 % (ref 35.0–45.0)
Hemoglobin: 13.6 g/dL (ref 11.7–15.5)
Lymphs Abs: 1991 cells/uL (ref 850–3900)
MCH: 28.9 pg (ref 27.0–33.0)
MCHC: 33.4 g/dL (ref 32.0–36.0)
MCV: 86.6 fL (ref 80.0–100.0)
MPV: 11.2 fL (ref 7.5–12.5)
Monocytes Relative: 10 %
Neutro Abs: 1617 cells/uL (ref 1500–7800)
Neutrophils Relative %: 38.5 %
Platelets: 258 10*3/uL (ref 140–400)
RBC: 4.7 10*6/uL (ref 3.80–5.10)
RDW: 11.8 % (ref 11.0–15.0)
Total Lymphocyte: 47.4 %
WBC: 4.2 10*3/uL (ref 3.8–10.8)

## 2021-09-26 LAB — COMPLETE METABOLIC PANEL WITH GFR
AG Ratio: 1.5 (calc) (ref 1.0–2.5)
ALT: 11 U/L (ref 6–29)
AST: 14 U/L (ref 10–35)
Albumin: 4.3 g/dL (ref 3.6–5.1)
Alkaline phosphatase (APISO): 78 U/L (ref 37–153)
BUN: 14 mg/dL (ref 7–25)
CO2: 30 mmol/L (ref 20–32)
Calcium: 9.6 mg/dL (ref 8.6–10.4)
Chloride: 102 mmol/L (ref 98–110)
Creat: 0.72 mg/dL (ref 0.50–1.03)
Globulin: 2.9 g/dL (calc) (ref 1.9–3.7)
Glucose, Bld: 91 mg/dL (ref 65–99)
Potassium: 5 mmol/L (ref 3.5–5.3)
Sodium: 140 mmol/L (ref 135–146)
Total Bilirubin: 0.3 mg/dL (ref 0.2–1.2)
Total Protein: 7.2 g/dL (ref 6.1–8.1)
eGFR: 100 mL/min/{1.73_m2} (ref >=60)

## 2021-09-26 NOTE — Progress Notes (Signed)
TB Gold is negative.

## 2021-10-03 ENCOUNTER — Encounter: Payer: Self-pay | Admitting: Allergy & Immunology

## 2021-10-03 ENCOUNTER — Other Ambulatory Visit: Payer: Self-pay

## 2021-10-03 ENCOUNTER — Ambulatory Visit: Payer: 59 | Admitting: Allergy & Immunology

## 2021-10-03 VITALS — BP 118/78 | HR 91 | Temp 98.0°F | Resp 16 | Ht 61.5 in | Wt 189.6 lb

## 2021-10-03 DIAGNOSIS — J3089 Other allergic rhinitis: Secondary | ICD-10-CM

## 2021-10-03 DIAGNOSIS — J302 Other seasonal allergic rhinitis: Secondary | ICD-10-CM | POA: Diagnosis not present

## 2021-10-03 DIAGNOSIS — R0602 Shortness of breath: Secondary | ICD-10-CM

## 2021-10-03 DIAGNOSIS — J4540 Moderate persistent asthma, uncomplicated: Secondary | ICD-10-CM

## 2021-10-03 DIAGNOSIS — H1010 Acute atopic conjunctivitis, unspecified eye: Secondary | ICD-10-CM

## 2021-10-03 DIAGNOSIS — K219 Gastro-esophageal reflux disease without esophagitis: Secondary | ICD-10-CM | POA: Diagnosis not present

## 2021-10-03 DIAGNOSIS — H1013 Acute atopic conjunctivitis, bilateral: Secondary | ICD-10-CM

## 2021-10-03 DIAGNOSIS — T7800XD Anaphylactic reaction due to unspecified food, subsequent encounter: Secondary | ICD-10-CM

## 2021-10-03 MED ORDER — ALBUTEROL SULFATE HFA 108 (90 BASE) MCG/ACT IN AERS: 2.0000 | INHALATION_SPRAY | Freq: Four times a day (QID) | RESPIRATORY_TRACT | 1 refills | Status: DC | PRN

## 2021-10-03 MED ORDER — AZELASTINE HCL 0.1 % NA SOLN: 1.0000 | Freq: Two times a day (BID) | NASAL | 5 refills | Status: DC

## 2021-10-03 MED ORDER — BUDESONIDE 0.5 MG/2ML IN SUSP: 0.5000 mg | Freq: Two times a day (BID) | RESPIRATORY_TRACT | 5 refills | Status: DC

## 2021-10-03 MED ORDER — OMEPRAZOLE 40 MG PO CPDR: 40.0000 mg | DELAYED_RELEASE_CAPSULE | Freq: Every morning | ORAL | 5 refills | Status: DC

## 2021-10-03 MED ORDER — ALBUTEROL SULFATE (2.5 MG/3ML) 0.083% IN NEBU: 2.5000 mg | INHALATION_SOLUTION | RESPIRATORY_TRACT | 1 refills | Status: DC | PRN

## 2021-10-03 NOTE — Progress Notes (Signed)
FOLLOW UP  Date of Service/Encounter:  10/03/21   Assessment:   Chronic rhinosinusitis    Moderate persistent asthma - with worsening symptoms since contracting COVID19 in the last month   Rheumatoid arthritis - on MTX, folic acid, and Plaquenil   Difficulty using the Dupixent autoinjector due to coexisting RA   History of migraines   Xerostomia - with positive ENA RNP Ab  Plan/Recommendations:   1. Moderate persistent asthma - complicated with recent [id number] infection - Lung testing looks lower today (in the 50-60% range), but it did improve 7% with the Xopenex treatment. - I am going to temporarily put you on Pulmicort nebs mixed with albuterol BID.  - We are going to get you back on the [place].  - This is not going to be a long term medication (I hope).  - Daily controller medication(s): Pulmicort 0.5mg  + albuterol neb solution TWICE DAILY via nebulizer and Dupizent 300mg  every 2 weeks  - Prior to physical activity: ProAir 2 puffs 10-15 minutes before physical activity - Rescue medications: ProAir 4 puffs every 4-6 hours as needed or albuterol nebulizer one vial puffs every 4-6 hours as needed  - Asthma control goals:  * Full participation in all desired activities (may need albuterol before activity) * Albuterol use two time or less a week on average (not counting use with activity) * Cough interfering with sleep two time or less a month * Oral steroids no more than once a year * No hospitalizations  2. LPRD (laryngopharyngeal reflux disease) - Continue with omeprazole daily.  - Continue with famotidine daily.   3. Seasonal allergic rhinitis - Continue with alternating antihistamines daily (change every 2-3 months). - Continue with Astelin one spray per nostril up to twice daily as needed.  - Continue with Ayr nasal saline gel.   4. Food allergies - Continue to avoid peanuts and tree nuts.  5. Return in about 6 weeks (around 11/14/2021).   Subjective:    Julie [name] is a 56 y.o. female presenting today for follow up of  Chief Complaint  Patient presents with   Asthma    Uses her inhaler up to 3 times a day    Other    23rd of December was positive for covid and still having issues with her breathing. Went back to work on 09/19/21.    Cough    Constant coughing     Julie [name] has a history of the following: Patient Active Problem List   Diagnosis Date Noted   Viral illness 09/08/2021   Anaphylactic shock due to adverse food reaction 02/01/2020   Moderate persistent asthma without complication 38/25/0539   Complication of anesthesia    Fibromyalgia 02/14/2018   History of gastroesophageal reflux (GERD) 02/14/2018   History of asthma 02/14/2018   Rheumatoid arthritis involving multiple sites with positive rheumatoid factor ([place]) 11/05/2016   High risk medication use 11/05/2016   LPRD (laryngopharyngeal reflux disease) 09/20/2015   Seasonal and perennial allergic rhinoconjunctivitis 09/20/2015   Acute sinusitis 07/29/2015   S/P total hysterectomy 05/25/2015    History obtained from: chart review and patient.  Julie is a 56 y.o. female presenting for a follow up visit.  She was last seen in July 2022.  At that time, Webb Silversmith recommended continuing with montelukast as well as albuterol as needed.  She remained on Dupixent every 2 weeks.  She had [place] to use during respiratory flares.  For her allergic rhinitis, she continued on shots.  She was started on Zyrtec 10 mg daily, to replace levocetirizine.  Alternating antihistamines were recommended.  She was continued on Astelin.  She also continue to avoid peanuts and tree nuts.  Since last visit, she has done well for the most part. She ad COVID in the last month and was placed on Paxlovid and a course of prednisone. 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 Her last Dupixent was over one month ago. She does not have a nebulizer at all.    Asthma/Respiratory  Symptom History: She is not on a controller medication and she has never been on this to her knowledge. She did have the prednisone.  She has had a lot of shortness of breath and reports difficulty taking a full breath.  She does feel better than she did when she was in the midst of COVID, but her symptoms have not completely resolved.  She denies any fevers.  She does have occasional chest pain, but this is slowly getting better.  She does not have a nebulizer at all at home.  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  She feels much better when she is on it.  She remains on Astelin as well as alternating antihistamines.  She changes every 2 to 3 months.  He has not seen Dr. Constance Holster in quite some time.  She has not had a sinus infection in a while.  Food Allergy Symptom History: She continues to avoid peanuts and tree nuts. Has had no accidental exposures.  She might need an EpiPen.  She is giving herself two shots already including Orencia. She is also on Plaquenil.   Otherwise, there have been no changes to her past medical history, surgical history, family history, or social history.    Review of Systems  Constitutional: Negative.  Negative for chills, fever, malaise/fatigue and weight loss.  HENT:  Positive for congestion. Negative for ear discharge, ear pain and sinus pain.   Eyes:  Negative for pain, discharge and redness.  Respiratory:  Positive for cough and shortness of breath. Negative for sputum production and wheezing.   Cardiovascular: Negative.  Negative for chest pain and palpitations.  Gastrointestinal:  Negative for abdominal pain, constipation, diarrhea, heartburn, nausea and vomiting.  Skin: Negative.  Negative for itching and rash.  Neurological:  Negative for dizziness and headaches.  Endo/Heme/Allergies:  Negative for environmental allergies. Does not bruise/bleed easily.      Objective:   Blood pressure  118/78, pulse 91, temperature 98 F (36.7 C), resp. rate 16, height 5' 1.5" (1.562 m), weight 189 lb 9.6 oz (86 kg), last menstrual period 05/19/2015, SpO2 97 %. Body mass index is 35.24 kg/m.   Physical Exam:  Physical Exam Vitals reviewed.  Constitutional:      Appearance: She is well-developed.  HENT:     Head: Normocephalic and atraumatic.     Right Ear: Tympanic membrane, ear canal and external ear normal.     Left Ear: Tympanic membrane, ear canal and external ear normal.     Nose: No nasal deformity, septal deviation, mucosal edema or rhinorrhea.     Right Turbinates: Enlarged, swollen and pale.     Left Turbinates: Enlarged, swollen and pale.     Right Sinus: No maxillary sinus tenderness or frontal sinus tenderness.     Left Sinus: No maxillary sinus tenderness or frontal sinus tenderness.     Comments: No nasal polyps noted.  Mouth/Throat:     Lips: Pink.     Mouth: Mucous membranes are moist. Mucous membranes are not pale and not dry.     Pharynx: Uvula midline.     Comments: Cobblestoning present in the posterior oropharynx.  Eyes:     General: Lids are normal. No allergic shiner.       Right eye: No discharge.        Left eye: No discharge.     Conjunctiva/sclera: Conjunctivae normal.     Right eye: Right conjunctiva is not injected. No chemosis.    Left eye: Left conjunctiva is not injected. No chemosis.    Pupils: Pupils are equal, round, and reactive to light.  Cardiovascular:     Rate and Rhythm: Normal rate and regular rhythm.     Heart sounds: Normal heart sounds.  Pulmonary:     Effort: Pulmonary effort is normal. No tachypnea, accessory muscle usage or respiratory distress.     Breath sounds: Normal breath sounds. No wheezing, rhonchi or rales.     Comments: Decreased air movement in the bases.  Chest:     Chest wall: No tenderness.  Lymphadenopathy:     Cervical: No cervical adenopathy.  Skin:    Coloration: Skin is not pale.     Findings: No  abrasion, erythema, petechiae or rash. Rash is not papular, urticarial or vesicular.  Neurological:     Mental Status: She is alert.  Psychiatric:        Behavior: Behavior is cooperative.     Diagnostic studies:    Spirometry: results abnormal (FEV1: 1.35/65%, FVC: 1.51/59%, FEV1/FVC: 89%).    Spirometry consistent with possible restrictive disease. This is all likely secondary to a recent COVID19 infection. She had 4 puffs of Xopenex and her FEV1 increased 7% and her FVC increased 9%.  Allergy Studies: none        Salvatore Marvel, MD  Allergy and [facility]

## 2021-10-03 NOTE — Patient Instructions (Addendum)
1. Moderate persistent asthma - complicated with recent HENID78 infection - Lung testing looks lower today (in the 50-60% range), but it did improve 7% with the Xopenex treatment. - I am going to temporarily put you on Pulmicort nebs mixed with albuterol BID.  - We are going to get you back on the [place].  - This is not going to be a long term medication (I hope).  - Daily controller medication(s): Pulmicort 0.5mg  + albuterol neb solution TWICE DAILY via nebulizer and Dupizent 300mg  every 2 weeks  - Prior to physical activity: ProAir 2 puffs 10-15 minutes before physical activity - Rescue medications: ProAir 4 puffs every 4-6 hours as needed or albuterol nebulizer one vial puffs every 4-6 hours as needed  - Asthma control goals:  * Full participation in all desired activities (may need albuterol before activity) * Albuterol use two time or less a week on average (not counting use with activity) * Cough interfering with sleep two time or less a month * Oral steroids no more than once a year * No hospitalizations  2. LPRD (laryngopharyngeal reflux disease) - Continue with omeprazole daily.  - Continue with famotidine daily.   3. Seasonal allergic rhinitis - Continue with alternating antihistamines daily (change every 2-3 months). - Continue with Astelin one spray per nostril up to twice daily as needed.  - Continue with Ayr nasal saline gel.   4. Food allergies - Continue to avoid peanuts and tree nuts.  5. Return in about 6 weeks (around 11/14/2021).    Please inform us of any Emergency Department visits, hospitalizations, or changes in symptoms. Call us before going to the ED for breathing or allergy symptoms since we might be able to fit you in for a sick visit. Feel free to contact us anytime with any questions, problems, or concerns.  It was a pleasure to see you again today!  Websites that have reliable patient information: 1. American Academy of Asthma, Allergy, and  Immunology: www.aaaai.org 2. Food Allergy Research and Education (FARE): foodallergy.org 3. Mothers of Asthmatics: http://www.asthmacommunitynetwork.org 4. American College of Allergy, Asthma, and Immunology: www.acaai.org   COVID-19 Vaccine Information can be found at: ShippingScam.co.uk For questions related to vaccine distribution or appointments, please email [email or web address] or call [phone].   We realize that you might be concerned about having an allergic reaction to the COVID19 vaccines. To help with that concern, WE ARE OFFERING THE COVID19 VACCINES IN OUR OFFICE! Ask the front desk for dates!     Like [place] on [place] and Instagram for our latest updates!      A healthy democracy works best when [employer] participate! Make sure you are registered to vote! If you have moved or changed any of your contact information, you will need to get this updated before voting!  In some cases, you MAY be able to register to vote online: [email or web address]

## 2021-10-09 ENCOUNTER — Encounter: Payer: Self-pay | Admitting: Allergy & Immunology

## 2021-10-09 ENCOUNTER — Telehealth: Payer: Self-pay | Admitting: *Deleted

## 2021-10-09 ENCOUNTER — Telehealth: Payer: Self-pay | Admitting: Allergy & Immunology

## 2021-10-09 NOTE — Telephone Encounter (Signed)
Patient states she spoke to Dr. Ernst Bowler about taking benzonatate for her cough as she had used that the first time she tested positive for COVID.   Patient states Dr. Ernst Bowler was supposed to send it in for her to [facility] [place] [address]  Best contact number: [phone]

## 2021-10-10 MED ORDER — BENZONATATE 100 MG PO CAPS: 100.0000 mg | ORAL_CAPSULE | Freq: Three times a day (TID) | ORAL | 1 refills | Status: DC | PRN

## 2021-10-10 NOTE — Telephone Encounter (Signed)
Sent it in.   Salvatore Marvel, MD Allergy and [facility]

## 2021-10-10 NOTE — Telephone Encounter (Signed)
Patient was notified.

## 2021-10-10 NOTE — Telephone Encounter (Signed)
Patient called and said that the benzonatate will run out before the month. [phone]

## 2021-10-10 NOTE — Telephone Encounter (Signed)
Do we need to send in more?

## 2021-10-11 NOTE — Telephone Encounter (Signed)
Attempted to call patient to discuss with her, there was an issue with getting through on the phone despite trying to call multiple times, will need to try and call again.

## 2021-10-11 NOTE — Telephone Encounter (Signed)
I guess you can send in more. If she is needing it multiple times a day for a more, we should be more aggressive with figuring out the cause of the cough.   Salvatore Marvel, MD Allergy and [facility] [place]

## 2021-10-12 ENCOUNTER — Other Ambulatory Visit: Payer: Self-pay | Admitting: *Deleted

## 2021-10-12 MED ORDER — FAMOTIDINE 40 MG PO TABS: 40.0000 mg | ORAL_TABLET | Freq: Two times a day (BID) | ORAL | 5 refills | Status: DC

## 2021-10-12 NOTE — Telephone Encounter (Signed)
Called and spoke with patient and she meant that she did not have enough famotidine to last her the month because she take it twice a day. New prescription has been sent in. Patient is aware and verbalized understanding.

## 2021-10-15 ENCOUNTER — Other Ambulatory Visit: Payer: Self-pay | Admitting: Rheumatology

## 2021-10-15 DIAGNOSIS — M0579 Rheumatoid arthritis with rheumatoid factor of multiple sites without organ or systems involvement: Secondary | ICD-10-CM

## 2021-10-16 ENCOUNTER — Telehealth: Payer: Self-pay | Admitting: Pharmacist

## 2021-10-16 NOTE — Telephone Encounter (Signed)
Next Visit: 02/20/2022  Last Visit: 09/22/2021  Last Fill: 05/30/2021  DX: Rheumatoid arthritis involving multiple sites with positive rheumatoid factor  Current Dose per office note 09/22/2021: Rasuvo 20 mg sq injections once weekly  Labs: 09/22/2021 CBC and CMP are normal.  Okay to refill Rasuvo?

## 2021-10-16 NOTE — Telephone Encounter (Signed)
Received fax from [facility] regarding Orencia prior authorization requiring renewal. Submitted via CMM.  Key: [id number]  Knox Saliva, PharmD, MPH, BCPS Clinical Pharmacist (Rheumatology and Pulmonology)

## 2021-10-17 NOTE — Telephone Encounter (Signed)
Received notification from [facility] regarding a prior authorization for [facility]. Authorization has been APPROVED from 10/16/21 to 10/16/22.    Authorization # Key: [id number] - [place] Case ID: [id number]

## 2021-10-18 ENCOUNTER — Other Ambulatory Visit: Payer: Self-pay

## 2021-10-18 ENCOUNTER — Ambulatory Visit (INDEPENDENT_AMBULATORY_CARE_PROVIDER_SITE_OTHER): Payer: 59 | Admitting: *Deleted

## 2021-10-18 DIAGNOSIS — J4540 Moderate persistent asthma, uncomplicated: Secondary | ICD-10-CM

## 2021-10-31 ENCOUNTER — Other Ambulatory Visit: Payer: Self-pay | Admitting: Rheumatology

## 2021-10-31 DIAGNOSIS — M0579 Rheumatoid arthritis with rheumatoid factor of multiple sites without organ or systems involvement: Secondary | ICD-10-CM

## 2021-10-31 MED ORDER — ORENCIA CLICKJECT 125 MG/ML ~~LOC~~ SOAJ: SUBCUTANEOUS | 0 refills | Status: DC

## 2021-10-31 NOTE — Telephone Encounter (Signed)
Next Visit: 02/20/2022   Last Visit: 09/22/2021   Last Fill: 08/07/2021  DX: Rheumatoid arthritis involving multiple sites with positive rheumatoid factor   Current Dose per office note 09/22/2021:Orencia 125 mg sq injections once weekly  Labs: 09/22/2021 CBC and CMP are normal.  TB Gold: 09/22/2021 Neg    Okay to refill Orencia?

## 2021-10-31 NOTE — Telephone Encounter (Signed)
Patient called the office requesting a refill of Orencia to be sent in to [facility]. Patient states she is almost out of medication.

## 2021-11-01 ENCOUNTER — Other Ambulatory Visit: Payer: Self-pay

## 2021-11-01 ENCOUNTER — Ambulatory Visit (INDEPENDENT_AMBULATORY_CARE_PROVIDER_SITE_OTHER): Payer: 59

## 2021-11-01 DIAGNOSIS — J4540 Moderate persistent asthma, uncomplicated: Secondary | ICD-10-CM

## 2021-11-03 ENCOUNTER — Inpatient Hospital Stay: Payer: 59 | Attending: Hematology and Oncology | Admitting: Hematology and Oncology

## 2021-11-03 ENCOUNTER — Other Ambulatory Visit: Payer: Self-pay

## 2021-11-03 ENCOUNTER — Inpatient Hospital Stay: Payer: 59

## 2021-11-03 ENCOUNTER — Encounter: Payer: Self-pay | Admitting: Hematology and Oncology

## 2021-11-03 VITALS — BP 146/85 | HR 71 | Temp 97.5°F | Resp 16 | Ht 61.0 in | Wt 188.6 lb

## 2021-11-03 DIAGNOSIS — D472 Monoclonal gammopathy: Secondary | ICD-10-CM

## 2021-11-03 DIAGNOSIS — M069 Rheumatoid arthritis, unspecified: Secondary | ICD-10-CM | POA: Diagnosis not present

## 2021-11-03 DIAGNOSIS — D509 Iron deficiency anemia, unspecified: Secondary | ICD-10-CM | POA: Insufficient documentation

## 2021-11-03 DIAGNOSIS — M0579 Rheumatoid arthritis with rheumatoid factor of multiple sites without organ or systems involvement: Secondary | ICD-10-CM

## 2021-11-03 DIAGNOSIS — Z800 Family history of malignant neoplasm of digestive organs: Secondary | ICD-10-CM | POA: Insufficient documentation

## 2021-11-03 DIAGNOSIS — Z8616 Personal history of COVID-19: Secondary | ICD-10-CM | POA: Diagnosis not present

## 2021-11-03 DIAGNOSIS — M797 Fibromyalgia: Secondary | ICD-10-CM | POA: Insufficient documentation

## 2021-11-03 DIAGNOSIS — J45909 Unspecified asthma, uncomplicated: Secondary | ICD-10-CM | POA: Insufficient documentation

## 2021-11-03 LAB — CMP (CANCER CENTER ONLY)
ALT: 13 U/L (ref 0–44)
AST: 15 U/L (ref 15–41)
Albumin: 4.1 g/dL (ref 3.5–5.0)
Alkaline Phosphatase: 74 U/L (ref 38–126)
Anion gap: 6 (ref 5–15)
BUN: 15 mg/dL (ref 6–20)
CO2: 28 mmol/L (ref 22–32)
Calcium: 9 mg/dL (ref 8.9–10.3)
Chloride: 107 mmol/L (ref 98–111)
Creatinine: 0.62 mg/dL (ref 0.44–1.00)
GFR, Estimated: >60 mL/min (ref >60)
Glucose, Bld: 109 mg/dL — ABNORMAL HIGH (ref 70–99)
Potassium: 4.4 mmol/L (ref 3.5–5.1)
Sodium: 141 mmol/L (ref 135–145)
Total Bilirubin: 0.4 mg/dL (ref 0.3–1.2)
Total Protein: 7.2 g/dL (ref 6.5–8.1)

## 2021-11-03 LAB — CBC WITH DIFFERENTIAL (CANCER CENTER ONLY)
Abs Immature Granulocytes: 0.01 10*3/uL (ref 0.00–0.07)
Basophils Absolute: 0 10*3/uL (ref 0.0–0.1)
Basophils Relative: 1 %
Eosinophils Absolute: 0.1 10*3/uL (ref 0.0–0.5)
Eosinophils Relative: 4 %
HCT: 36.9 % (ref 36.0–46.0)
Hemoglobin: 12.4 g/dL (ref 12.0–15.0)
Immature Granulocytes: 0 %
Lymphocytes Relative: 43 %
Lymphs Abs: 1.5 10*3/uL (ref 0.7–4.0)
MCH: 28.6 pg (ref 26.0–34.0)
MCHC: 33.6 g/dL (ref 30.0–36.0)
MCV: 85 fL (ref 80.0–100.0)
Monocytes Absolute: 0.2 10*3/uL (ref 0.1–1.0)
Monocytes Relative: 6 %
Neutro Abs: 1.7 10*3/uL (ref 1.7–7.7)
Neutrophils Relative %: 46 %
Platelet Count: 233 10*3/uL (ref 150–400)
RBC: 4.34 MIL/uL (ref 3.87–5.11)
RDW: 11.5 % (ref 11.5–15.5)
WBC Count: 3.6 10*3/uL — ABNORMAL LOW (ref 4.0–10.5)
nRBC: 0 % (ref 0.0–0.2)

## 2021-11-03 NOTE — Progress Notes (Signed)
[facility] CONSULT NOTE  Patient Care Team: Lin Landsman, MD as PCP - General (Family Medicine)  CHIEF COMPLAINTS/PURPOSE OF CONSULTATION:  Abnormal SPEP  ASSESSMENT & PLAN:    This is a pleasant 56 year old female patient with a past medical history significant for rheumatoid arthritis, asthma, iron deficiency anemia referred to hematology for evaluation of abnormal labs. 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   She is here for follow-up.  Since last visit no new health concerns. Physical examination unremarkable, no palpable lymphadenopathy or hepatosplenomegaly. CBC reviewed, mild leukopenia otherwise no issues.  Last labs from August with IgG kappa monoclonal gammopathy, M protein measuring 0.4 mg/dL, otherwise no major concerns. If her labs from today show stable M protein, she can return to clinic in 1 year with repeat labs.  Age-appropriate cancer screening advised.  HISTORY OF PRESENTING ILLNESS:   Julie [name] 56 y.o. female is here because of abnormal SPEP.  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   Since we last saw her, she feels fatigued, cant sleep well. She had COVID in December, had chronic cough from COVID, feeling better with inhaler. No change in bowel habits No new bone pains. She has some pain from fibromyalgia. No other B symptoms.  Rest of the pertinent 10 point ROS reviewed and negative.  MEDICAL HISTORY:  Past Medical History:  Diagnosis Date   Anemia    Asthma    Complication of anesthesia    Eczema    Esophageal dysphagia    GERD (gastroesophageal reflux disease)    PONV (postoperative nausea and vomiting)    Rheumatoid arthritis ([place])     SURGICAL  HISTORY: Past Surgical History:  Procedure Laterality Date   ABDOMINAL HYSTERECTOMY     partial age 34   BILATERAL SALPINGECTOMY Bilateral 05/25/2015   Procedure: BILATERAL SALPINGECTOMY;  Surgeon: Servando Salina, MD;  Location: [facility];  Service: Gynecology;  Laterality: Bilateral;   CARPAL TUNNEL RELEASE Left 01/05/2020   Procedure: LEFT CARPAL TUNNEL RELEASE;  Surgeon: Daryll Brod, MD;  Location: [facility];  Service: Orthopedics;  Laterality: Left;  FOREARM BIER BLOCK   COLONOSCOPY     OVARIAN CYST REMOVAL Right 05/25/2015   Procedure: OVARIAN CYSTECTOMY;  Surgeon: Servando Salina, MD;  Location: [facility];  Service: Gynecology;  Laterality: Right;   TONSILLECTOMY     TUBAL LIGATION      SOCIAL HISTORY: Social History   Socioeconomic History   Marital status: Married    Spouse name: Not on file   Number of children: Not on file   Years of education: Not on file   Highest education level: Not on file  Occupational History   Not on file  Tobacco Use   Smoking status: Never   Smokeless tobacco: Never  Vaping Use   Vaping Use: Never used  Substance and Sexual Activity   Alcohol use: No   Drug use: No   Sexual activity: Not on file  Other Topics Concern   Not on file  Social History Narrative   Not on file   Social Determinants of Health   Financial Resource Strain: Not on file  Food Insecurity: Not on file  Transportation Needs: Not on file  Physical Activity: Not on  file  Stress: Not on file  Social Connections: Not on file  Intimate Partner Violence: Not on file    FAMILY HISTORY: Family History  Problem Relation Age of Onset   Asthma Mother    Eczema Mother    Sarcoidosis Mother    Hypertension Mother    Cancer Father    Hypotension Father    Congestive Heart Failure Father    Colon cancer Father    Multiple myeloma Father    Asthma Sister    GER disease Sister    Allergic rhinitis Neg Hx    Angioedema Neg Hx    Immunodeficiency Neg  Hx    Urticaria Neg Hx    Esophageal cancer Neg Hx    Rectal cancer Neg Hx    Stomach cancer Neg Hx     ALLERGIES:  is allergic to aspirin, augmentin [amoxicillin-pot clavulanate], and peanut-containing drug products.  MEDICATIONS:  Current Outpatient Medications  Medication Sig Dispense Refill   Abatacept (ORENCIA CLICKJECT) 952 MG/ML SOAJ INJECT 125MG SUBCUTANEOUSLY WEEKLY 12 mL 0   albuterol (PROVENTIL) (2.5 MG/3ML) 0.083% nebulizer solution Take 3 mLs (2.5 mg total) by nebulization every 4 (four) hours as needed for wheezing or shortness of breath. 75 mL 1   albuterol (VENTOLIN HFA) 108 (90 Base) MCG/ACT inhaler Inhale 2 puffs into the lungs every 6 (six) hours as needed for wheezing or shortness of breath. 18 g 1   azelastine (ASTELIN) 0.1 % nasal spray Place 1 spray into both nostrils 2 (two) times daily. Use in each nostril as directed 30 mL 5   B-D TB SYRINGE 1CC/27GX1/2" 27G X 1/2" 1 ML MISC Use to inject Methotrexate weekly. 12 each 3   benzonatate (TESSALON) 100 MG capsule Take 1 capsule (100 mg total) by mouth 3 (three) times daily as needed for cough. 21 capsule 1   budesonide (PULMICORT) 0.5 MG/2ML nebulizer solution Take 2 mLs (0.5 mg total) by nebulization 2 (two) times daily. 2 mL 5   cetirizine (ZYRTEC) 10 MG tablet Zyrtec 10 mg tablet 30 tablet 5   diclofenac Sodium (VOLTAREN) 1 % GEL Apply 2g to 4g to affected area up to 4 times daily as needed. 400 g 4   DUPIXENT 300 MG/2ML prefilled syringe INJECT 300MG SUBCUTANEOUSLY EVERY OTHER WEEK 4 mL 11   EPINEPHrine 0.3 mg/0.3 mL IJ SOAJ injection Inject 0.3 mLs (0.3 mg total) into the muscle as needed for anaphylaxis. 2 each 2   escitalopram (LEXAPRO) 20 MG tablet Take 20 mg by mouth daily.  5   famotidine (PEPCID) 40 MG tablet Take 40 mg by mouth daily.     famotidine (PEPCID) 40 MG tablet Take 1 tablet (40 mg total) by mouth 2 (two) times daily. 60 tablet 5   folic acid (FOLVITE) 1 MG tablet Take 1 tablet (1 mg total) by  mouth 2 (two) times daily. 180 tablet 3   hydroxychloroquine (PLAQUENIL) 200 MG tablet TAKE 1 TABLET TWICE A DAY MONDAY -FRIDAY 40 tablet 2   levocetirizine (XYZAL) 5 MG tablet Take 5 mg by mouth at bedtime.     montelukast (SINGULAIR) 10 MG tablet Take 10 mg by mouth daily.     Multiple Vitamins-Minerals (DAILY MULTIVITAMIN PO) Take 1 tablet by mouth daily.      Nutritional Supplements (ESTROVEN PO) Take by mouth. (Patient not taking: Reported on 10/03/2021)     omeprazole (PRILOSEC) 40 MG capsule Take 1 capsule (40 mg total) by mouth every morning. 30 capsule 5  RASUVO 20 MG/0.4ML SOAJ INJECT 20MG SUBCUTANEOUSLY  ONCE WEEKLY 4.8 mL 0   Current Facility-Administered Medications  Medication Dose Route Frequency Provider Last Rate Last Admin   Dupilumab SOSY 300 mg  300 mg Subcutaneous Q14 Days Dara Hoyer, FNP   300 mg at 11/01/21 1413    PHYSICAL EXAMINATION: ECOG PERFORMANCE STATUS: 0 - Asymptomatic  Vitals:   11/03/21 0848  BP: (!) 146/85  Pulse: 71  Resp: 16  Temp: (!) 97.5 F (36.4 C)  SpO2: 98%   Filed Weights   11/03/21 0848  Weight: 188 lb 9.6 oz (85.5 kg)   Physical Exam Constitutional:      Appearance: Normal appearance.  HENT:     Head: Normocephalic and atraumatic.  Cardiovascular:     Rate and Rhythm: Normal rate and regular rhythm.  Pulmonary:     Effort: Pulmonary effort is normal.     Breath sounds: Normal breath sounds.  Abdominal:     General: Abdomen is flat.     Palpations: Abdomen is soft.  Musculoskeletal:        General: No swelling or tenderness.  Skin:    General: Skin is warm and dry.  Neurological:     General: No focal deficit present.     Mental Status: She is alert.  Psychiatric:        Mood and Affect: Mood normal.        Behavior: Behavior normal.     LABORATORY DATA:  I have reviewed the data as listed Lab Results  Component Value Date   WBC 3.6 (L) 11/03/2021   HGB 12.4 11/03/2021   HCT 36.9 11/03/2021   MCV 85.0  11/03/2021   PLT 233 11/03/2021     Chemistry      Component Value Date/Time   NA 140 09/22/2021 1037   NA 140 10/02/2016 1602   K 5.0 09/22/2021 1037   CL 102 09/22/2021 1037   CO2 30 09/22/2021 1037   BUN 14 09/22/2021 1037   BUN 8 10/02/2016 1602   CREATININE 0.72 09/22/2021 1037      Component Value Date/Time   CALCIUM 9.6 09/22/2021 1037   ALKPHOS 82 05/03/2021 1439   AST 14 09/22/2021 1037   AST 18 05/03/2021 1439   ALT 11 09/22/2021 1037   ALT 20 05/03/2021 1439   BILITOT 0.3 09/22/2021 1037   BILITOT 0.3 05/03/2021 1439      RADIOGRAPHIC STUDIES: I have personally reviewed the radiological images as listed and agreed with the findings in the report. No results found.   I have reviewed pertinent labs I spent 20 minutes in the care of this patient including history and physical, review of records, discussion about MGUS and surveillance recommendations.    Benay Pike, MD 11/03/2021 9:03 AM

## 2021-11-04 LAB — IGG, IGA, IGM
IgA: 154 mg/dL (ref 87–352)
IgG (Immunoglobin G), Serum: 1307 mg/dL (ref 586–1602)
IgM (Immunoglobulin M), Srm: 108 mg/dL (ref 26–217)

## 2021-11-06 ENCOUNTER — Other Ambulatory Visit: Payer: Self-pay | Admitting: Family Medicine

## 2021-11-06 LAB — KAPPA/LAMBDA LIGHT CHAINS
Kappa free light chain: 27.9 mg/L — ABNORMAL HIGH (ref 3.3–19.4)
Kappa, lambda light chain ratio: 2.56 — ABNORMAL HIGH (ref 0.26–1.65)
Lambda free light chains: 10.9 mg/L (ref 5.7–26.3)

## 2021-11-07 ENCOUNTER — Encounter: Payer: Self-pay | Admitting: Hematology and Oncology

## 2021-11-08 LAB — PROTEIN ELECTROPHORESIS, SERUM, WITH REFLEX
A/G Ratio: 1.1 (ref 0.7–1.7)
Albumin ELP: 3.8 g/dL (ref 2.9–4.4)
Alpha-1-Globulin: 0.2 g/dL (ref 0.0–0.4)
Alpha-2-Globulin: 0.8 g/dL (ref 0.4–1.0)
Beta Globulin: 1 g/dL (ref 0.7–1.3)
Gamma Globulin: 1.3 g/dL (ref 0.4–1.8)
Globulin, Total: 3.4 g/dL (ref 2.2–3.9)
M-Spike, %: 0.6 g/dL — ABNORMAL HIGH
SPEP Interpretation: 0
Total Protein ELP: 7.2 g/dL (ref 6.0–8.5)

## 2021-11-08 LAB — IMMUNOFIXATION REFLEX, SERUM
IgA: 157 mg/dL (ref 87–352)
IgG (Immunoglobin G), Serum: 1327 mg/dL (ref 586–1602)
IgM (Immunoglobulin M), Srm: 116 mg/dL (ref 26–217)

## 2021-11-09 ENCOUNTER — Telehealth: Payer: Self-pay | Admitting: Hematology and Oncology

## 2021-11-09 NOTE — Telephone Encounter (Signed)
Scheduled appointment per 02/22 los. Left message with appointment times.

## 2021-11-10 ENCOUNTER — Other Ambulatory Visit: Payer: Self-pay | Admitting: *Deleted

## 2021-11-10 DIAGNOSIS — M0579 Rheumatoid arthritis with rheumatoid factor of multiple sites without organ or systems involvement: Secondary | ICD-10-CM

## 2021-11-14 ENCOUNTER — Ambulatory Visit (INDEPENDENT_AMBULATORY_CARE_PROVIDER_SITE_OTHER): Payer: 59 | Admitting: Allergy & Immunology

## 2021-11-14 ENCOUNTER — Encounter: Payer: Self-pay | Admitting: Allergy & Immunology

## 2021-11-14 VITALS — BP 136/86 | HR 90 | Temp 97.9°F | Resp 18 | Ht 61.0 in | Wt 190.0 lb

## 2021-11-14 DIAGNOSIS — R5383 Other fatigue: Secondary | ICD-10-CM

## 2021-11-14 DIAGNOSIS — K219 Gastro-esophageal reflux disease without esophagitis: Secondary | ICD-10-CM

## 2021-11-14 DIAGNOSIS — J4540 Moderate persistent asthma, uncomplicated: Secondary | ICD-10-CM | POA: Diagnosis not present

## 2021-11-14 DIAGNOSIS — J302 Other seasonal allergic rhinitis: Secondary | ICD-10-CM | POA: Diagnosis not present

## 2021-11-14 DIAGNOSIS — T7800XD Anaphylactic reaction due to unspecified food, subsequent encounter: Secondary | ICD-10-CM

## 2021-11-14 DIAGNOSIS — H1013 Acute atopic conjunctivitis, bilateral: Secondary | ICD-10-CM

## 2021-11-14 DIAGNOSIS — U099 Post covid-19 condition, unspecified: Secondary | ICD-10-CM

## 2021-11-14 MED ORDER — MONTELUKAST SODIUM 10 MG PO TABS
10.0000 mg | ORAL_TABLET | Freq: Every day | ORAL | 2 refills | Status: DC
Start: 2021-11-14 — End: 2022-02-05

## 2021-11-14 NOTE — Patient Instructions (Addendum)
1. Moderate persistent asthma - complicated with recent VCBSW96 infection - Lung testing looks a LOT better today. - I think this is going to just take time to resolve.  - We are going to continue with your nebulized medications for now.  - We are going to get a vitamin D level as well as a B12 level. - You can start a B12 or vitamin B complex supplement now to see if this can help with your fatigue. - I would wait for the vitamin D supplement since it is a fat soluble vitamin (and therefore easier to overdose). - We will contact you with the results of the labs and go from there.  - Daily controller medication(s): Pulmicort 0.5mg  + albuterol neb solution 1-2 TIMES DAILY via nebulizer and Dupixent 300mg  every 2 weeks  - Prior to physical activity: ProAir 2 puffs 10-15 minutes before physical activity - Rescue medications: ProAir 4 puffs every 4-6 hours as needed or albuterol nebulizer one vial puffs every 4-6 hours as needed  - Asthma control goals:  * Full participation in all desired activities (may need albuterol before activity) * Albuterol use two time or less a week on average (not counting use with activity) * Cough interfering with sleep two time or less a month * Oral steroids no more than once a year * No hospitalizations  2. LPRD (laryngopharyngeal reflux disease) - Continue with omeprazole daily.  - Continue with famotidine daily.   3. Seasonal allergic rhinitis - Continue with alternating antihistamines daily (change every 2-3 months). - Continue with Astelin one spray per nostril up to twice daily as needed.  - Continue with Ayr nasal saline gel.   4. Food allergies - Continue to avoid peanuts and tree nuts.  5. Return in about 3 months (around 02/11/2022).    Please inform us of any Emergency Department visits, hospitalizations, or changes in symptoms. Call us before going to the ED for breathing or allergy symptoms since we might be able to fit you in for a sick visit.  Feel free to contact us anytime with any questions, problems, or concerns.  It was a pleasure to see you again today!  Websites that have reliable patient information: 1. American Academy of Asthma, Allergy, and Immunology: www.aaaai.org 2. Food Allergy Research and Education (FARE): foodallergy.org 3. Mothers of Asthmatics: http://www.asthmacommunitynetwork.org 4. American College of Allergy, Asthma, and Immunology: www.acaai.org   COVID-19 Vaccine Information can be found at: ShippingScam.[email or web address].uk For questions related to vaccine distribution or appointments, please email [email or web address] or call [phone].   We realize that you might be concerned about having an allergic reaction to the COVID19 vaccines. To help with that concern, WE ARE OFFERING THE COVID19 VACCINES IN OUR OFFICE! Ask the front desk for dates!     Like [place] on [place] and Instagram for our latest updates!      A healthy democracy works best when [employer] participate! Make sure you are registered to vote! If you have moved or changed any of your contact information, you will need to get this updated before voting!  In some cases, you MAY be able to register to vote online: [email or web address]

## 2021-11-14 NOTE — Progress Notes (Signed)
FOLLOW UP  Date of Service/Encounter:  11/14/21   Assessment:   Chronic rhinosinusitis    Moderate persistent asthma - with worsening symptoms since contracting COVID19 in the last month   Rheumatoid arthritis - on MTX, folic acid, and Plaquenil   Difficulty using the Dupixent autoinjector due to coexisting RA   History of migraines   Xerostomia - with positive ENA RNP Ab  Long COVID syndrome  Plan/Recommendations:   1. Moderate persistent asthma - complicated with recent OACZY60 infection - Lung testing looks a LOT better today. - I think this is going to just take time to resolve.  - We are going to continue with your nebulized medications for now.  - We are going to get a vitamin D level as well as a B12 level. - You can start a B12 or vitamin B complex supplement now to see if this can help with your fatigue. - I would wait for the vitamin D supplement since it is a fat soluble vitamin (and therefore easier to overdose). - We will contact you with the results of the labs and go from there.  - Daily controller medication(s): Pulmicort 0.75m + albuterol neb solution 1-2 TIMES DAILY via nebulizer and Dupixent 3022mevery 2 weeks  - Prior to physical activity: ProAir 2 puffs 10-15 minutes before physical activity - Rescue medications: ProAir 4 puffs every 4-6 hours as needed or albuterol nebulizer one vial puffs every 4-6 hours as needed  - Asthma control goals:  * Full participation in all desired activities (may need albuterol before activity) * Albuterol use two time or less a week on average (not counting use with activity) * Cough interfering with sleep two time or less a month * Oral steroids no more than once a year * No hospitalizations  2. LPRD (laryngopharyngeal reflux disease) - Continue with omeprazole daily.  - Continue with famotidine daily.   3. Seasonal allergic rhinitis - Continue with alternating antihistamines daily (change every 2-3 months). -  Continue with Astelin one spray per nostril up to twice daily as needed.  - Continue with Ayr nasal saline gel.   4. Food allergies - Continue to avoid peanuts and tree nuts.  5. Return in about 3 months (around 02/11/2022).    Subjective:   Julie DENISA ENTERLINEs a [age].o. female presenting today for follow up of  Chief Complaint  Patient presents with   Follow-up    Julie [name] has a history of the following: Patient Active Problem List   Diagnosis Date Noted   Viral illness 09/08/2021   Anaphylactic shock due to adverse food reaction 02/01/2020   Moderate persistent asthma without complication 0363/09/6008 Complication of anesthesia    Fibromyalgia 02/14/2018   History of gastroesophageal reflux (GERD) 02/14/2018   History of asthma 02/14/2018   Rheumatoid arthritis involving multiple sites with positive rheumatoid factor (HCIronton02/19/2018   High risk medication use 11/05/2016   LPRD (laryngopharyngeal reflux disease) 09/20/2015   Seasonal and perennial allergic rhinoconjunctivitis 09/20/2015   Acute sinusitis 07/29/2015   S/P total hysterectomy 05/25/2015    History obtained from: chart review and patient.  Julie [name] a [age].o. female presenting for a follow up visit.  She was last seen in January 2023.  At that time, she had a recent COVID-19 infection and was experiencing shortness of breath.  Her lung testing looked lower in the 50 to 60% range but it did improve with Xopenex treatment.  We  started her on Pulmicort mixed with albuterol twice daily.  We also got her back into detail.  For her reflux, we continue with omeprazole famotidine.  For her allergic rhinitis, we continued with alternating antihistamines as well as Astelin.  Since last visit, she has mostly done well. She did just go with her husband to have surgery for an injury at work. She unfortunately got out of the surgical center late and was late arriving for her appointment today.   Asthma/Respiratory  Symptom History: She tells me today that her breathing is less of an issue than it was before. But she is still having the coughing and brain fog. She has been diagnosed with COVID brain.  She just continues to have COVID brain and feels old. She has been doing the Pulmicort once daily. This makes her feel "so drunk". She takes her Dupixent next week and this has helped. She has decreased her Pulmicort down to once daily because of this sensation.   Allergic Rhinitis Symptom History: She has remained on her antihistamine - levocetirizine - as well as Astelin.  She has not needed antibiotics since the last visit. Overall allergy symptoms are well controlled.   Food Allergy Symptom History: She remains on omeprazole as well as famotidine. She has not had any breakthrough symptoms at all.  She reports that she was diagnosed with "possible fibromyalgia or rheumatoid" by her eye doctor. She also carries a diagnosis of MGUS.  She has been seeing oncology since February 2022.  She had an SPEP that was ordered at that noted a faint IgG kappa band.  Evidently there is a 1% risk of conversion to multiple myeloma.  Evidently her dad passed away from multiple myeloma so she is very concerned about her risks.  Otherwise, there have been no changes to her past medical history, surgical history, family history, or social history.    Review of Systems  Constitutional:  Positive for malaise/fatigue. Negative for chills, fever and weight loss.  HENT:  Positive for congestion. Negative for ear discharge and ear pain.   Eyes:  Negative for pain, discharge and redness.  Respiratory:  Negative for cough, sputum production, shortness of breath and wheezing.   Cardiovascular: Negative.  Negative for chest pain and palpitations.  Gastrointestinal:  Negative for abdominal pain, constipation, diarrhea, heartburn, nausea and vomiting.  Skin: Negative.  Negative for itching and rash.  Neurological:  Negative for dizziness  and headaches.  Endo/Heme/Allergies:  Negative for environmental allergies. Does not bruise/bleed easily.      Objective:   Blood pressure 136/86, pulse 90, temperature 97.9 F (36.6 C), resp. rate 18, height 5' 1" (1.549 m), weight 190 lb (86.2 kg), last menstrual period 05/19/2015, SpO2 98 %. Body mass index is 35.9 kg/m.    Physical Exam Vitals reviewed.  Constitutional:      Appearance: She is well-developed.  HENT:     Head: Normocephalic and atraumatic.     Right Ear: Tympanic membrane, ear canal and external ear normal.     Left Ear: Tympanic membrane, ear canal and external ear normal.     Nose: Mucosal edema and rhinorrhea present. No nasal deformity or septal deviation.     Right Turbinates: Enlarged, swollen and pale.     Left Turbinates: Enlarged, swollen and pale.     Right Sinus: No maxillary sinus tenderness or frontal sinus tenderness.     Left Sinus: No maxillary sinus tenderness or frontal sinus tenderness.     Comments: No  nasal polyps noted.     Mouth/Throat:     Lips: Pink.     Mouth: Mucous membranes are moist. Mucous membranes are not pale and not dry.     Pharynx: Uvula midline.     Comments: Cobblestoning present in the posterior oropharynx.  Eyes:     General: Lids are normal. No allergic shiner.       Right eye: No discharge.        Left eye: No discharge.     Conjunctiva/sclera: Conjunctivae normal.     Right eye: Right conjunctiva is not injected. No chemosis.    Left eye: Left conjunctiva is not injected. No chemosis.    Pupils: Pupils are equal, round, and reactive to light.  Cardiovascular:     Rate and Rhythm: Normal rate and regular rhythm.     Heart sounds: Normal heart sounds.  Pulmonary:     Effort: Pulmonary effort is normal. No tachypnea, accessory muscle usage or respiratory distress.     Breath sounds: Normal breath sounds. No wheezing, rhonchi or rales.     Comments: Decreased air movement in the bases.  Chest:     Chest wall:  No tenderness.  Lymphadenopathy:     Cervical: No cervical adenopathy.  Skin:    Coloration: Skin is not pale.     Findings: No abrasion, erythema, petechiae or rash. Rash is not papular, urticarial or vesicular.  Neurological:     Mental Status: She is alert.  Psychiatric:        Behavior: Behavior is cooperative.     Diagnostic studies:    Spirometry: results normal (FEV1: 1.56/75%, FVC: 2.02/78%, FEV1/FVC: 77%).    Spirometry consistent with normal pattern. Overall values are higher than those obtained at the last visit.   We ordered labs for vitamin B12 and vitamin D.      Salvatore Marvel, MD  [facility] and [place] [facility] [place]

## 2021-11-15 ENCOUNTER — Encounter: Payer: Self-pay | Admitting: Allergy & Immunology

## 2021-11-16 ENCOUNTER — Ambulatory Visit: Payer: 59

## 2021-11-24 ENCOUNTER — Telehealth: Payer: Self-pay | Admitting: Gastroenterology

## 2021-11-24 ENCOUNTER — Other Ambulatory Visit: Payer: Self-pay | Admitting: Family Medicine

## 2021-11-24 DIAGNOSIS — Z1231 Encounter for screening mammogram for malignant neoplasm of breast: Secondary | ICD-10-CM

## 2021-11-24 NOTE — Telephone Encounter (Signed)
Hey Dr. Silverio Decamp,  ? ?Patient called in to transfer care back to you for burning sensation in her rectum. She was a previous patient of yours back in 2020 before transferring to [facility]. She would like to transfer care back to you because of distance. Her records from [facility] are in [place]. Could you please review and advise on scheduling? ? ?Thank you ?

## 2021-11-24 NOTE — Telephone Encounter (Signed)
Request received to transfer GI care from outside practice to [facility].  We appreciate the interest to transfer back to our practice, however at this time due to high demand from patients without established GI providers we cannot accommodate this transfer.  Ability to accommodate future transfer requests may change over time and the patient can contact us again in 6-12 months if still interested in being seen at [facility].     ? ?

## 2021-11-29 ENCOUNTER — Encounter: Payer: Self-pay | Admitting: Allergy & Immunology

## 2021-11-29 ENCOUNTER — Telehealth: Payer: Self-pay | Admitting: Allergy & Immunology

## 2021-11-29 NOTE — Telephone Encounter (Signed)
Patient states she has been struggling with a sinus infection. Patient states this has been going on since last Thursday. Patient stated she feels congested, and it is behind her nose and running into her throat - her sinuses are bleeding as well and the cold air is not helping. Patient states she has been using her azelastine spray - it has caused a burning sensation.  ? ?Patient has not called PCP to discuss.  ? ?Best contact number: [phone] ?

## 2021-11-30 LAB — B12 AND FOLATE PANEL
Folate: >20 ng/mL (ref >3.0)
Vitamin B-12: 1013 pg/mL (ref 232–1245)

## 2021-11-30 LAB — VITAMIN D 1,25 DIHYDROXY
Vitamin D 1, 25 (OH)2 Total: 35 pg/mL
Vitamin D2 1, 25 (OH)2: <10 pg/mL
Vitamin D3 1, 25 (OH)2: 29 pg/mL

## 2021-11-30 MED ORDER — PREDNISONE 10 MG PO TABS: ORAL_TABLET | ORAL | 0 refills | Status: DC

## 2021-11-30 NOTE — Telephone Encounter (Signed)
I called the patient an confirmed the pharmacy. Patient saw that her lab results have been posted and would like for Dr. Ernst Bowler to review them.  ?

## 2021-11-30 NOTE — Telephone Encounter (Signed)
Spoke with patient. Advised of recommendation. Expressed understanding.  ?

## 2021-11-30 NOTE — Telephone Encounter (Signed)
I pended some prednisone. Let's see if that helps at all. Can you call to confirm the pharmacy and sign on my behalf? ? ?Salvatore Marvel, MD ?[facility] [place] [facility] [place] ? ?

## 2021-12-08 ENCOUNTER — Other Ambulatory Visit: Payer: Self-pay | Admitting: Rheumatology

## 2021-12-08 ENCOUNTER — Telehealth: Payer: Self-pay

## 2021-12-08 NOTE — Telephone Encounter (Signed)
Patient called stating she had her plaquenil eye exam earlier this week at [facility].  ?

## 2021-12-08 NOTE — Telephone Encounter (Signed)
Next Visit: 02/20/2022 ?  ?Last Visit: 09/22/2021 ?  ?Last Fill: 09/07/2021 ? ?DX: Rheumatoid arthritis involving multiple sites with positive rheumatoid factor ?  ?Current Dose per office note 09/22/2021: Plaquenil 200 mg 1 tablet by mouth twice daily Monday through Friday. ? ?Labs: 09/22/2021 CBC and CMP are normal. ? ?PLQ Eye Exam: 10/26/2020  WNL ? ?Left message to advise patient she is due to update her PLQ eye exam.  ?  ?Okay to refill PLQ?  ?

## 2021-12-08 NOTE — Telephone Encounter (Signed)
Spoke with [facility] and they will fax office notes.  ?

## 2021-12-13 ENCOUNTER — Other Ambulatory Visit: Payer: Self-pay | Admitting: Allergy & Immunology

## 2021-12-14 ENCOUNTER — Encounter: Payer: Self-pay | Admitting: Allergy & Immunology

## 2021-12-14 DIAGNOSIS — R053 Chronic cough: Secondary | ICD-10-CM

## 2021-12-20 ENCOUNTER — Ambulatory Visit (INDEPENDENT_AMBULATORY_CARE_PROVIDER_SITE_OTHER): Payer: 59

## 2021-12-20 DIAGNOSIS — J4540 Moderate persistent asthma, uncomplicated: Secondary | ICD-10-CM

## 2022-01-03 ENCOUNTER — Ambulatory Visit (INDEPENDENT_AMBULATORY_CARE_PROVIDER_SITE_OTHER): Payer: 59

## 2022-01-03 DIAGNOSIS — J4540 Moderate persistent asthma, uncomplicated: Secondary | ICD-10-CM

## 2022-01-07 ENCOUNTER — Other Ambulatory Visit: Payer: Self-pay

## 2022-01-07 ENCOUNTER — Ambulatory Visit (HOSPITAL_BASED_OUTPATIENT_CLINIC_OR_DEPARTMENT_OTHER): Payer: 59 | Attending: Family Medicine | Admitting: Internal Medicine

## 2022-01-07 VITALS — Ht 61.0 in | Wt 190.0 lb

## 2022-01-07 DIAGNOSIS — G4700 Insomnia, unspecified: Secondary | ICD-10-CM | POA: Insufficient documentation

## 2022-01-07 DIAGNOSIS — R5383 Other fatigue: Secondary | ICD-10-CM

## 2022-01-07 DIAGNOSIS — R0681 Apnea, not elsewhere classified: Secondary | ICD-10-CM | POA: Diagnosis not present

## 2022-01-07 DIAGNOSIS — G4733 Obstructive sleep apnea (adult) (pediatric): Secondary | ICD-10-CM

## 2022-01-13 DIAGNOSIS — R0681 Apnea, not elsewhere classified: Secondary | ICD-10-CM

## 2022-01-13 NOTE — Procedures (Signed)
? ? ?Patient Name: Julie [name], Julie [name] ?Study Date: 01/07/2022 ?Gender: Female ?D.O.B: [birth year] ?Age (years): 70 ?Referring Provider: Lin Landsman ?Height (inches): 61 ?Interpreting Physician: Baird Lyons MD, ABSM ?Weight (lbs): 190 ?RPSGT: Jorge Ny ?BMI: 36 ?MRN: [id number] ?Neck Size: 13.50 ? ?CLINICAL INFORMATION ?Sleep Study Type: Split Night CPAP ?Indication for sleep study: Fatigue, Snoring, Witnessed Apneas ?Epworth Sleepiness Score: 11 ? ?SLEEP STUDY TECHNIQUE ?As per the AASM Manual for the Scoring of Sleep and Associated Events v2.3 (April 2016) with a hypopnea requiring 4% desaturations. ? ?The channels recorded and monitored were frontal, central and occipital EEG, electrooculogram (EOG), submentalis EMG (chin), nasal and oral airflow, thoracic and abdominal wall motion, anterior tibialis EMG, snore microphone, electrocardiogram, and pulse oximetry. Continuous positive airway pressure (CPAP) was initiated when the patient met split night criteria and was titrated according to treat sleep-disordered breathing. ? ?MEDICATIONS ?Medications self-administered by patient taken the night of the study : FAMOTIDINE, FOLIC ACID, MONTELUKAST, CETIRIZINE ? ?RESPIRATORY PARAMETERS ?Diagnostic ? ?Total AHI (/hr): 35.9 RDI (/hr): 50.4 OA Index (/hr): 0.9 CA Index (/hr): 0.0 ?REM AHI (/hr): 63.0 NREM AHI (/hr): 28.1 Supine AHI (/hr): 69.8 Non-supine AHI (/hr): 34 ?Min O2 Sat (%): 73.0 Mean O2 (%): 94.9 Time below 88% (min): 6.7  ? ?Titration ? ?Optimal Pressure (cm):  ?AHI at Optimal Pressure (/hr): N/A Min O2 at Optimal Pressure (%): 89.0 ?Supine % at Optimal (%): N/A Sleep % at Optimal (%): N/A  ? ?SLEEP ARCHITECTURE ?The recording time for the entire night was 404.4 minutes. ? ?During a baseline period of 158.3 minutes, the patient slept for 128.6 minutes in REM and nonREM, yielding a sleep efficiency of 81.2%%. Sleep onset after lights out was 3.2 minutes with a REM latency of 72.5 minutes. The patient spent  8.6%% of the night in stage N1 sleep, 46.3%% in stage N2 sleep, 21.4%% in stage N3 and 23.7% in REM. ? ?During the titration period of 237.7 minutes, the patient slept for 226.7 minutes in REM and nonREM, yielding a sleep efficiency of 95.4%%. Sleep onset after CPAP initiation was 0.0 minutes with a REM latency of 73.0 minutes. The patient spent 2.4%% of the night in stage N1 sleep, 50.3%% in stage N2 sleep, 21.4%% in stage N3 and 25.9% in REM. ? ?CARDIAC DATA ?The 2 lead EKG demonstrated sinus rhythm. The mean heart rate was 78.2 beats per minute. Other EKG findings include: PVCs. ? ?LEG MOVEMENT DATA ?The total Periodic Limb Movements of Sleep (PLMS) were 0. The PLMS index was 0.0 . ? ?IMPRESSIONS ?- Severe obstructive sleep apnea occurred during the diagnostic portion of the study (AHI = 35.9/hour). ?- CPAP titrated to 12 cwp. ?- No significant central sleep apnea occurred during the diagnostic portion of the study (CAI = 0.0/hour). ?- Oxygen desaturation was noted during the diagnostic portion of the study (Min O2 = 73.0%). Minimum O2 saturation on CPAP 12 was 91%. ?- The patient snored with moderate snoring volume during the diagnostic portion of the study. ?- EKG findings include PVCs. ?- Clinically significant periodic limb movements did not occur during sleep. ? ?DIAGNOSIS ?- Obstructive Sleep Apnea (G47.33) ? ?RECOMMENDATIONS ?- Suggest trial of CPAP 12 or autopap 5-15. ?- Patient wore a small/medium [employer] Evora full face mask with heated humidity. ?- Be careful with alcohol, sedatives and other CNS depressants that may worsen sleep apnea and disrupt normal sleep architecture. ?- Sleep hygiene should be reviewed to assess factors that may improve sleep quality. ?- Weight management and regular exercise  should be initiated or continued. ? ?[Electronically signed] 01/13/2022 11:17 AM ? ?Baird Lyons MD, ABSM ?Diplomate, Tax adviser of Sleep Medicine ?NPI: [id number] ? ?   ? ? ? ? ? ? ? ? ? ? ? ? ? ? ? ? ? ? ? ? ? ?Xander Jutras ?Diplomate, Tax adviser of Sleep Medicine ? ?ELECTRONICALLY SIGNED ON:  01/13/2022, 11:12 AM ?[place] ?PH: [phone]   FX: [phone] ?ACCREDITED BY THE AMERICAN ACADEMY OF SLEEP MEDICINE ?

## 2022-01-17 ENCOUNTER — Ambulatory Visit (INDEPENDENT_AMBULATORY_CARE_PROVIDER_SITE_OTHER): Payer: 59

## 2022-01-17 DIAGNOSIS — J4540 Moderate persistent asthma, uncomplicated: Secondary | ICD-10-CM

## 2022-01-22 ENCOUNTER — Other Ambulatory Visit: Payer: Self-pay | Admitting: Physician Assistant

## 2022-01-22 DIAGNOSIS — M0579 Rheumatoid arthritis with rheumatoid factor of multiple sites without organ or systems involvement: Secondary | ICD-10-CM

## 2022-01-22 NOTE — Telephone Encounter (Signed)
Next Visit: 02/20/2022 ?  ?Last Visit: 09/22/2021 ?  ?Last Fill: 10/31/2021 ? ?DX: Rheumatoid arthritis involving multiple sites with positive rheumatoid factor ?  ?Current Dose per office note 09/22/2021:Orencia 125 mg sq injections once weekly ? ?Labs: 11/03/2021 Glucose 109, WBC 3.6 ? ?TB Gold: 09/22/2021 Neg  ?

## 2022-01-24 ENCOUNTER — Other Ambulatory Visit: Payer: Self-pay | Admitting: Family Medicine

## 2022-01-30 NOTE — Addendum Note (Signed)
Addended by: Valentina Shaggy on: 01/30/2022 06:16 PM ? ? Modules accepted: Orders ? ?

## 2022-01-31 ENCOUNTER — Encounter: Payer: Self-pay | Admitting: Diagnostic Neuroimaging

## 2022-01-31 ENCOUNTER — Ambulatory Visit (INDEPENDENT_AMBULATORY_CARE_PROVIDER_SITE_OTHER): Payer: 59

## 2022-01-31 ENCOUNTER — Ambulatory Visit: Payer: 59 | Admitting: Diagnostic Neuroimaging

## 2022-01-31 ENCOUNTER — Ambulatory Visit
Admission: RE | Admit: 2022-01-31 | Discharge: 2022-01-31 | Disposition: A | Discharge status: Home / Self Care | Payer: 59 | Source: Ambulatory Visit | Attending: Allergy & Immunology | Admitting: Allergy & Immunology

## 2022-01-31 VITALS — BP 138/91 | HR 76 | Ht 61.25 in | Wt 189.0 lb

## 2022-01-31 DIAGNOSIS — G4733 Obstructive sleep apnea (adult) (pediatric): Secondary | ICD-10-CM | POA: Diagnosis not present

## 2022-01-31 DIAGNOSIS — J4540 Moderate persistent asthma, uncomplicated: Secondary | ICD-10-CM

## 2022-01-31 DIAGNOSIS — R4189 Other symptoms and signs involving cognitive functions and awareness: Secondary | ICD-10-CM | POA: Diagnosis not present

## 2022-01-31 DIAGNOSIS — U099 Post covid-19 condition, unspecified: Secondary | ICD-10-CM

## 2022-01-31 DIAGNOSIS — R5383 Other fatigue: Secondary | ICD-10-CM

## 2022-01-31 NOTE — Progress Notes (Signed)
? ?[facility] ? ?PATIENT: Julie [name] ?DOB: 02-13-1966 ? ?REFERRING CLINICIAN: Valentina Shaggy, * ?HISTORY FROM: patient  ?REASON FOR VISIT: new consult ? ? ?HISTORICAL ? ?CHIEF COMPLAINT:  ?Chief Complaint  ?Patient presents with  ? New Patient (Initial Visit)  ?  RM 7 reports in December of 2022 she was d/x with covid. Since this diagnosis she has been struggling with long covid sx ( coughing, headaches, brain fog, hoarseness of voice). Taste has returned to normal. Pt was advised by her allergist to have f/u to discuss these symptoms.   ? ? ?HISTORY OF PRESENT ILLNESS:  ? 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  Patient had COVID in December 2022.  Since that time she is having chronic symptoms of persistent headache, cough, brain fog, memory problems.  Has seen PCP, allergist and pulmonologist.  Also was recently diagnosed with severe sleep apnea, waiting to be set up for CPAP.  Has history of rheumatoid arthritis, connective tissue disorder, fibromyalgia, asthma, reflux and seasonal allergies. ? ? ?REVIEW OF SYSTEMS: Full 14 system review of systems performed and negative with exception of: as per HPI. ? ?ALLERGIES: ?Allergies  ?Allergen Reactions  ? Aspirin Other (See Comments)  ?  pt couldn't take while on methotrexate, but is no longer taking  ? Augmentin [Amoxicillin-Pot Clavulanate] Other (See Comments)  ?  G I upset  ? Peanut-Containing Drug Products   ? ? ?HOME MEDICATIONS: ?Outpatient Medications Prior to Visit  ?Medication Sig Dispense Refill  ? Abatacept (ORENCIA CLICKJECT) 462 MG/ML SOAJ INJECT 125MG SUBCUTANEOUSLY ONCE WEEKLY 4 mL 2  ? albuterol (PROVENTIL) (2.5 MG/3ML) 0.083% nebulizer solution Take 3 mLs (2.5 mg total) by nebulization every 4 (four) hours as needed for wheezing or shortness of breath. 75 mL 1  ? albuterol (VENTOLIN HFA) 108 (90 Base) MCG/ACT inhaler Inhale 2 puffs into the lungs every 6 (six) hours as needed for wheezing or  shortness of breath. 18 g 1  ? azelastine (ASTELIN) 0.1 % nasal spray Place 1 spray into both nostrils 2 (two) times daily. Use in each nostril as directed 30 mL 5  ? B-D TB SYRINGE 1CC/27GX1/2" 27G X 1/2" 1 ML MISC Use to inject Methotrexate weekly. 12 each 3  ? benzonatate (TESSALON) 100 MG capsule Take 1 capsule (100 mg total) by mouth 3 (three) times daily as needed for cough. 21 capsule 1  ? budesonide (PULMICORT) 0.5 MG/2ML nebulizer solution Take 2 mLs (0.5 mg total) by nebulization 2 (two) times daily. 2 mL 5  ? cetirizine (ZYRTEC) 10 MG tablet TAKE 1 TABLET EVERY DAY 30 tablet 5  ? diclofenac Sodium (VOLTAREN) 1 % GEL Apply 2g to 4g to affected area up to 4 times daily as needed. 400 g 4  ? DUPIXENT 300 MG/2ML prefilled syringe INJECT 300MG SUBCUTANEOUSLY EVERY OTHER WEEK 4 mL 11  ? EPINEPHrine 0.3 mg/0.3 mL IJ SOAJ injection Inject 0.3 mLs (0.3 mg total) into the muscle as needed for anaphylaxis. 2 each 2  ? escitalopram (LEXAPRO) 20 MG tablet Take 20 mg by mouth daily.  5  ? famotidine (PEPCID) 40 MG tablet Take 40 mg by mouth daily.    ? famotidine (PEPCID) 40 MG tablet Take 1 tablet (40 mg total) by mouth 2 (two) times daily. 60 tablet 5  ? folic acid (FOLVITE) 1 MG tablet Take 1 tablet (1 mg total) by mouth 2 (two) times daily. 180 tablet 3  ? hydroxychloroquine (PLAQUENIL) 200 MG tablet TAKE 1  TABLET TWICE A DAY MONDAY -FRIDAY 40 tablet 2  ? levocetirizine (XYZAL) 5 MG tablet Take 5 mg by mouth at bedtime.    ? montelukast (SINGULAIR) 10 MG tablet Take 1 tablet (10 mg total) by mouth daily. 30 tablet 2  ? Multiple Vitamins-Minerals (DAILY MULTIVITAMIN PO) Take 1 tablet by mouth daily.     ? Nutritional Supplements (ESTROVEN PO) Take by mouth.    ? omeprazole (PRILOSEC) 40 MG capsule Take 1 capsule (40 mg total) by mouth every morning. 30 capsule 5  ? predniSONE (DELTASONE) 10 MG tablet Take two tablets (65m) twice daily for three days, then one tablet (139m twice daily for three days, then STOP. 18  tablet 0  ? RASUVO 20 MG/0.4ML SOAJ INJECT 20MG SUBCUTANEOUSLY  ONCE WEEKLY 4.8 mL 0  ? ?Facility-Administered Medications Prior to Visit  ?Medication Dose Route Frequency Provider Last Rate Last Admin  ? Dupilumab SOSY 300 mg  300 mg Subcutaneous Q14 Days AmDara HoyerFNP   300 mg at 01/17/22 1421  ? ? ?PAST MEDICAL HISTORY: ?Past Medical History:  ?Diagnosis Date  ? Anemia   ? Asthma   ? Complication of anesthesia   ? Eczema   ? Esophageal dysphagia   ? GERD (gastroesophageal reflux disease)   ? PONV (postoperative nausea and vomiting)   ? Rheumatoid arthritis ([place]  ? ? ?PAST SURGICAL HISTORY: ?Past Surgical History:  ?Procedure Laterality Date  ? ABDOMINAL HYSTERECTOMY    ? partial age 56? BILATERAL SALPINGECTOMY Bilateral 05/25/2015  ? Procedure: BILATERAL SALPINGECTOMY;  Surgeon: ShServando SalinaMD;  [facility];  Service: Gynecology;  Laterality: Bilateral;  ? CARPAL TUNNEL RELEASE Left 01/05/2020  ? Procedure: LEFT CARPAL TUNNEL RELEASE;  Surgeon: KuDaryll BrodMD;  [facility]: [facility];  Laterality: Left;  FOREARM BIER BLOCK  ? COLONOSCOPY    ? OVARIAN CYST REMOVAL Right 05/25/2015  ? Procedure: OVARIAN CYSTECTOMY;  Surgeon: ShServando SalinaMD;  [facility];  Service: Gynecology;  Laterality: Right;  ? TONSILLECTOMY    ? TUBAL LIGATION    ? ? ?FAMILY HISTORY: ?Family History  ?Problem Relation Age of Onset  ? Asthma Mother   ? Eczema Mother   ? Sarcoidosis Mother   ? Hypertension Mother   ? Cancer Father   ? Hypotension Father   ? Congestive Heart Failure Father   ? Colon cancer Father   ? Multiple myeloma Father   ? Asthma Sister   ? GER disease Sister   ? Allergic rhinitis Neg Hx   ? Angioedema Neg Hx   ? Immunodeficiency Neg Hx   ? Urticaria Neg Hx   ? Esophageal cancer Neg Hx   ? Rectal cancer Neg Hx   ? Stomach cancer Neg Hx   ? ? ?SOCIAL HISTORY: ?Social History  ? ?Socioeconomic History  ? Marital status: Married  ?  Spouse name: Not on file  ?  Number of children: Not on file  ? Years of education: Not on file  ? Highest education level: Not on file  ?Occupational History  ? Not on file  ?Tobacco Use  ? Smoking status: Never  ? Smokeless tobacco: Never  ?Vaping Use  ? Vaping Use: Never used  ?Substance and Sexual Activity  ? Alcohol use: No  ? Drug use: No  ? Sexual activity: Not on file  ?Other Topics Concern  ? Not on file  ?Social History Narrative  ? Right handed   ?  Caffeine- 2 cups per day   ? ?Social Determinants of Health  ? ?Financial Resource Strain: Not on file  ?Food Insecurity: Not on file  ?Transportation Needs: Not on file  ?Physical Activity: Not on file  ?Stress: Not on file  ?Social Connections: Not on file  ?Intimate Partner Violence: Not on file  ? ? ? ?PHYSICAL EXAM ? ?GENERAL EXAM/CONSTITUTIONAL: ?Vitals:  ?Vitals:  ? 01/31/22 0858  ?BP: (!) 138/91  ?Pulse: 76  ?Weight: 189 lb (85.7 kg)  ?Height: 5' 1.25" (1.556 m)  ? ?Body mass index is 35.42 kg/m?. ?Wt Readings from Last 3 Encounters:  ?01/31/22 189 lb (85.7 kg)  ?01/07/22 190 lb (86.2 kg)  ?11/14/21 190 lb (86.2 kg)  ? ?Patient is in no distress; well developed, nourished and groomed; neck is supple ? ?CARDIOVASCULAR: ?Examination of carotid arteries is normal; no carotid bruits ?Regular rate and rhythm, no murmurs ?Examination of peripheral vascular system by observation and palpation is normal ? ?EYES: ?Ophthalmoscopic exam of optic discs and posterior segments is normal; no papilledema or hemorrhages ?No results found. ? ?MUSCULOSKELETAL: ?Gait, strength, tone, movements noted in Neurologic exam below ? ?NEUROLOGIC: ?MENTAL STATUS:  ?   ? View : No data to display.  ?  ?  ?  ? ?awake, alert, oriented to person, place and time ?recent and remote memory intact ?normal attention and concentration ?language fluent, comprehension intact, naming intact ?fund of knowledge appropriate ? ?CRANIAL NERVE:  ?2nd - no papilledema on fundoscopic exam ?2nd, 3rd, 4th, 6th - pupils equal and  reactive to light, visual fields full to confrontation, extraocular muscles intact, no nystagmus ?5th - facial sensation symmetric ?7th - facial strength symmetric ?8th - hearing intact ?9th - palate eleva

## 2022-01-31 NOTE — Patient Instructions (Signed)
POST-COVID SYNDROME (persistent cough, brain fog, memory loss, fatigue) ?- continue supportive care and compensation strategies ?- safety / supervision issues reviewed ?- daily physical activity / exercise (at least 15-30 minutes) ?- eat more plants / vegetables ?- increase social activities, brain stimulation, games, puzzles, hobbies, crafts, arts, music ?- aim for at least 7-8 hours sleep per night (or more) ?- avoid smoking and alcohol ?- caregiver resources provided ?- caution with medications, finances, driving ?- also recent diagnosis of severe OSA --> follow up for CPAP treatment ?

## 2022-02-02 ENCOUNTER — Encounter: Payer: Self-pay | Admitting: Allergy & Immunology

## 2022-02-05 ENCOUNTER — Other Ambulatory Visit: Payer: Self-pay | Admitting: Allergy & Immunology

## 2022-02-05 ENCOUNTER — Other Ambulatory Visit: Payer: Self-pay | Admitting: Rheumatology

## 2022-02-05 NOTE — Telephone Encounter (Signed)
Next Visit: 02/20/2022   Last Visit: 09/22/2021   Last Fill: 01/30/2021  DX: Rheumatoid arthritis involving multiple sites with positive rheumatoid factor   Current Dose per office note 09/22/2021: not discussed  Okay to refill Folic Acid?

## 2022-02-06 MED ORDER — PANTOPRAZOLE SODIUM 40 MG PO TBEC: 40.0000 mg | DELAYED_RELEASE_TABLET | Freq: Every day | ORAL | 3 refills | Status: DC

## 2022-02-07 NOTE — Progress Notes (Deleted)
Office Visit Note  Patient: Julie [name]             Date of Birth: [birth year]           MRN: [id number]             PCP: Lin Landsman, MD Referring: Lin Landsman, MD Visit Date: 02/20/2022 Occupation: @GUAROCC @  Subjective:    History of Present Illness: Julie M Giusto is a 56 y.o. female with history of seropositive rheumatoid arthritis. She remains on Orencia 125 mg sq injections once weekly, Rasuvo 20 mg sq injections once weekly, and Plaquenil 200 mg 1 tablet by mouth twice daily Monday through Friday.   TB gold negative on 09/22/21.  CBC and CMP updated on 11/03/21.  She is due to update lab work.  Orders released.  Discussed the importance of holding orencia and rasuvo if she develops signs or symptoms of an infection and to resume once the infection has completely cleared.  PLQ Eye Exam: 12/08/2021 WNL at [facility] Follow up in 1 year   Activities of Daily Living:  Patient reports morning stiffness for *** {minute/hour:19697}.   Patient {ACTIONS;DENIES/REPORTS:21021675::"Denies"} nocturnal pain.  Difficulty dressing/grooming: {ACTIONS;DENIES/REPORTS:21021675::"Denies"} Difficulty climbing stairs: {ACTIONS;DENIES/REPORTS:21021675::"Denies"} Difficulty getting out of chair: {ACTIONS;DENIES/REPORTS:21021675::"Denies"} Difficulty using hands for taps, buttons, cutlery, and/or writing: {ACTIONS;DENIES/REPORTS:21021675::"Denies"}  No Rheumatology ROS completed.   PMFS History:  Patient Active Problem List   Diagnosis Date Noted   Witnessed episode of apnea 01/07/2022   Viral illness 09/08/2021   Anaphylactic shock due to adverse food reaction 02/01/2020   Moderate persistent asthma without complication 71/24/5809   Complication of anesthesia    Fibromyalgia 02/14/2018   History of gastroesophageal reflux (GERD) 02/14/2018   History of asthma 02/14/2018   Rheumatoid arthritis involving multiple sites with positive rheumatoid factor ([place]) 11/05/2016   High risk  medication use 11/05/2016   LPRD (laryngopharyngeal reflux disease) 09/20/2015   Seasonal and perennial allergic rhinoconjunctivitis 09/20/2015   Acute sinusitis 07/29/2015   S/P total hysterectomy 05/25/2015    Past Medical History:  Diagnosis Date   Anemia    Asthma    Complication of anesthesia    Eczema    Esophageal dysphagia    GERD (gastroesophageal reflux disease)    PONV (postoperative nausea and vomiting)    Rheumatoid arthritis ([place])     Family History  Problem Relation Age of Onset   Asthma Mother    Eczema Mother    Sarcoidosis Mother    Hypertension Mother    Cancer Father    Hypotension Father    Congestive Heart Failure Father    Colon cancer Father    Multiple myeloma Father    Asthma Sister    GER disease Sister    Allergic rhinitis Neg Hx    Angioedema Neg Hx    Immunodeficiency Neg Hx    Urticaria Neg Hx    Esophageal cancer Neg Hx    Rectal cancer Neg Hx    Stomach cancer Neg Hx    Past Surgical History:  Procedure Laterality Date   ABDOMINAL HYSTERECTOMY     partial age 84   BILATERAL SALPINGECTOMY Bilateral 05/25/2015   Procedure: BILATERAL SALPINGECTOMY;  Surgeon: Servando Salina, MD;  Location: [facility];  Service: Gynecology;  Laterality: Bilateral;   CARPAL TUNNEL RELEASE Left 01/05/2020   Procedure: LEFT CARPAL TUNNEL RELEASE;  Surgeon: Daryll Brod, MD;  Location: [facility];  Service: Orthopedics;  Laterality: Left;  FOREARM BIER BLOCK   COLONOSCOPY  OVARIAN CYST REMOVAL Right 05/25/2015   Procedure: OVARIAN CYSTECTOMY;  Surgeon: Servando Salina, MD;  Location: [facility];  Service: Gynecology;  Laterality: Right;   TONSILLECTOMY     TUBAL LIGATION     Social History   Social History Narrative   Right handed    Caffeine- 2 cups per day    Immunization History  Administered Date(s) Administered   Influenza Inj Mdck Quad Pf 10/19/2017   Influenza Split 11/25/2014   Influenza, Quadrivalent, Recombinant, Inj, Pf  08/26/2019   Influenza, Seasonal, Injecte, Preservative Fre 09/20/2015   Influenza,inj,Quad PF,6+ Mos 08/04/2018   Influenza-Unspecified 07/18/2017   PFIZER(Purple Top)SARS-COV-2 Vaccination 11/28/2019, 12/19/2019, 06/23/2020     Objective: Vital Signs: LMP 05/19/2015    Physical Exam Vitals and nursing note reviewed.  Constitutional:      Appearance: She is well-developed.  HENT:     Head: Normocephalic and atraumatic.  Eyes:     Conjunctiva/sclera: Conjunctivae normal.  Cardiovascular:     Rate and Rhythm: Normal rate and regular rhythm.     Heart sounds: Normal heart sounds.  Pulmonary:     Effort: Pulmonary effort is normal.     Breath sounds: Normal breath sounds.  Abdominal:     General: Bowel sounds are normal.     Palpations: Abdomen is soft.  Musculoskeletal:     Cervical back: Normal range of motion.  Skin:    General: Skin is warm and dry.     Capillary Refill: Capillary refill takes less than 2 seconds.  Neurological:     Mental Status: She is alert and oriented to person, place, and time.  Psychiatric:        Behavior: Behavior normal.     Musculoskeletal Exam: ***  CDAI Exam: CDAI Score: -- Patient Global: --; Provider Global: -- Swollen: --; Tender: -- Joint Exam 02/20/2022   No joint exam has been documented for this visit   There is currently no information documented on the homunculus. Go to the Rheumatology activity and complete the homunculus joint exam.  Investigation: No additional findings.  Imaging: DG Chest 2 View  Result Date: 02/01/2022 CLINICAL DATA:  Chronic cough following COVID EXAM: CHEST - 2 VIEW COMPARISON:  10/08/2018 FINDINGS: The heart size and mediastinal contours are within normal limits. Both lungs are clear. The visualized skeletal structures are unremarkable. IMPRESSION: No active cardiopulmonary disease. Electronically Signed   By: Inez Catalina M.D.   On: 02/01/2022 03:55   SLEEP STUDY DOCUMENTS  Result Date:  01/16/2022 Ordered by an unspecified provider.  SLEEP STUDY DOCUMENTS  Result Date: 01/10/2022 Ordered by an unspecified provider.   Recent Labs: Lab Results  Component Value Date   WBC 3.6 (L) 11/03/2021   HGB 12.4 11/03/2021   PLT 233 11/03/2021   NA 141 11/03/2021   K 4.4 11/03/2021   CL 107 11/03/2021   CO2 28 11/03/2021   GLUCOSE 109 (H) 11/03/2021   BUN 15 11/03/2021   CREATININE 0.62 11/03/2021   BILITOT 0.4 11/03/2021   ALKPHOS 74 11/03/2021   AST 15 11/03/2021   ALT 13 11/03/2021   PROT 7.2 11/03/2021   ALBUMIN 4.1 11/03/2021   CALCIUM 9.0 11/03/2021   GFRAA 118 03/16/2021   QFTBGOLDPLUS NEGATIVE 09/22/2021    Speciality Comments: PLQ Eye Exam: 12/08/2021  WNL at [facility] Follow up in 1 year  Procedures:  No procedures performed Allergies: Aspirin, Augmentin [amoxicillin-pot clavulanate], and Peanut-containing drug products   Assessment / Plan:     Visit Diagnoses:  Rheumatoid arthritis involving multiple sites with positive rheumatoid factor (HCC)  High risk medication use  Fibromyalgia  Positive ANA (antinuclear antibody)  Anti-RNP antibodies present  Sicca syndrome (HCC)  Osteopenia of spine  History of Helicobacter pylori infection  History of asthma  History of gastroesophageal reflux (GERD)  History of depression  Abnormal SPEP  Orders: No orders of the defined types were placed in this encounter.  No orders of the defined types were placed in this encounter.   Face-to-face time spent with patient was *** minutes. Greater than 50% of time was spent in counseling and coordination of care.  Follow-Up Instructions: No follow-ups on file.   Ofilia Neas, PA-C  Note - This record has been created using Dragon software.  Chart creation errors have been sought, but may not always  have been located. Such creation errors do not reflect on  the standard of medical care.

## 2022-02-13 ENCOUNTER — Ambulatory Visit: Payer: 59

## 2022-02-13 ENCOUNTER — Ambulatory Visit (INDEPENDENT_AMBULATORY_CARE_PROVIDER_SITE_OTHER): Payer: 59 | Admitting: Allergy & Immunology

## 2022-02-13 VITALS — BP 130/90 | HR 90 | Temp 98.0°F | Resp 16 | Ht 61.0 in | Wt 191.6 lb

## 2022-02-13 DIAGNOSIS — J4540 Moderate persistent asthma, uncomplicated: Secondary | ICD-10-CM

## 2022-02-13 DIAGNOSIS — H1013 Acute atopic conjunctivitis, bilateral: Secondary | ICD-10-CM

## 2022-02-13 DIAGNOSIS — K219 Gastro-esophageal reflux disease without esophagitis: Secondary | ICD-10-CM | POA: Diagnosis not present

## 2022-02-13 DIAGNOSIS — J302 Other seasonal allergic rhinitis: Secondary | ICD-10-CM

## 2022-02-13 DIAGNOSIS — J383 Other diseases of vocal cords: Secondary | ICD-10-CM | POA: Diagnosis not present

## 2022-02-13 DIAGNOSIS — R5383 Other fatigue: Secondary | ICD-10-CM

## 2022-02-13 DIAGNOSIS — H1010 Acute atopic conjunctivitis, unspecified eye: Secondary | ICD-10-CM

## 2022-02-13 DIAGNOSIS — R053 Chronic cough: Secondary | ICD-10-CM

## 2022-02-13 DIAGNOSIS — R490 Dysphonia: Secondary | ICD-10-CM

## 2022-02-13 NOTE — Patient Instructions (Addendum)
1. Moderate persistent asthma - complicated with recent RAQTM22 infection - Lung testing looks good today. - We are going to refer you to see Dr. Johnna Acosta, an ENT that specializes in vocal cord dysfunction (she is at [facility]).  - We are also going to refer you to see Pulmonology here in [place].  - We can try to get you in at the same time as your mother to save some trips.  - We are not going to make any medication changes.  - Daily controller medication(s): Pulmicort 0.'5mg'$  + albuterol neb solution 1-2 TIMES DAILY via nebulizer and Dupixent '300mg'$  every 2 weeks  - Prior to physical activity: ProAir 2 puffs 10-15 minutes before physical activity - Rescue medications: ProAir 4 puffs every 4-6 hours as needed or albuterol nebulizer one vial puffs every 4-6 hours as needed  - Asthma control goals:  * Full participation in all desired activities (may need albuterol before activity) * Albuterol use two time or less a week on average (not counting use with activity) * Cough interfering with sleep two time or less a month * Oral steroids no more than once a year * No hospitalizations  2. LPRD (laryngopharyngeal reflux disease) - Continue with omeprazole daily.  - Continue with pantoprazole at night.   3. Seasonal allergic rhinitis - Continue with alternating antihistamines daily (change every 2-3 months). - Continue with Astelin one spray per nostril up to twice daily as needed.  - Continue with Ayr nasal saline gel.   4. Food allergies - Continue to avoid peanuts and tree nuts.  5. Return in about 3 months (around 05/16/2022).    Please inform us of any Emergency Department visits, hospitalizations, or changes in symptoms. Call us before going to the ED for breathing or allergy symptoms since we might be able to fit you in for a sick visit. Feel free to contact us anytime with any questions, problems, or concerns.  It was a pleasure to see you again today!  Websites  that have reliable patient information: 1. American Academy of Asthma, Allergy, and Immunology: www.aaaai.org 2. Food Allergy Research and Education (FARE): foodallergy.org 3. Mothers of Asthmatics: http://www.asthmacommunitynetwork.org 4. American College of Allergy, Asthma, and Immunology: www.acaai.org   COVID-19 Vaccine Information can be found at: ShippingScam.co.uk For questions related to vaccine distribution or appointments, please email [email or web address] or call [phone].   We realize that you might be concerned about having an allergic reaction to the COVID19 vaccines. To help with that concern, WE ARE OFFERING THE COVID19 VACCINES IN OUR OFFICE! Ask the front desk for dates!     "Like" [place] on Facebook and Instagram for our latest updates!      A healthy democracy works best when [employer] participate! Make sure you are registered to vote! If you have moved or changed any of your contact information, you will need to get this updated before voting!  In some cases, you MAY be able to register to vote online: [email or web address]

## 2022-02-13 NOTE — Progress Notes (Signed)
FOLLOW UP  Date of Service/Encounter:  02/13/22   Assessment:   Chronic rhinosinusitis    Moderate persistent asthma - with worsening symptoms since contracting COVID19 in the last month   Rheumatoid arthritis - on MTX, folic acid, and Plaquenil   Difficulty using the Dupixent autoinjector due to coexisting RA   History of migraines   Xerostomia - with positive ENA RNP Ab   Long COVID syndrome  Plan/Recommendations:    There are no Patient Instructions on file for this visit.   Subjective:   [place] MISHIKA FLIPPEN is a 56 y.o. female presenting today for follow up of  Chief Complaint  Patient presents with  . Asthma    Been ok. Very winded since Long Covid from last year. Does her breathing treatments at night.  . Allergic Rhinitis     Flare up due to the season.   . Cough    Has aggregated cough that just won't go away since having COVID    [place] M Fairbank has a history of the following: Patient Active Problem List   Diagnosis Date Noted  . Witnessed episode of apnea 01/07/2022  . Viral illness 09/08/2021  . Anaphylactic shock due to adverse food reaction 02/01/2020  . Moderate persistent asthma without complication 38/06/1750  . Complication of anesthesia   . Fibromyalgia 02/14/2018  . History of gastroesophageal reflux (GERD) 02/14/2018  . History of asthma 02/14/2018  . Rheumatoid arthritis involving multiple sites with positive rheumatoid factor ([place]) 11/05/2016  . High risk medication use 11/05/2016  . LPRD (laryngopharyngeal reflux disease) 09/20/2015  . Seasonal and perennial allergic rhinoconjunctivitis 09/20/2015  . Acute sinusitis 07/29/2015  . S/P total hysterectomy 05/25/2015    History obtained from: chart review and {Persons; PED relatives w/patient:19415::"patient"}.  [place] is a 56 y.o. female presenting for {Blank single:19197::"a food challenge","a drug challenge","skin testing","a sick visit","an evaluation of ***","a follow up  visit"}.  She went to see Neurology.   She had a coughing fit years ago with Dr. Neldon Mc and was referred for voice therapy.   Asthma/Respiratory Symptom History: She remains on the Pulmicort and albuterol daily. She is doing it only in the morning. She thinks that she feels so winded and she can tell a difference. She was feel that her shot "wearing off" and she feels better when her shot is given.   {Blank single:19197::"Allergic Rhinitis Symptom History: ***"," "}  {Blank single:19197::"Food Allergy Symptom History: ***"," "}  {Blank single:19197::"Skin Symptom History: ***"," "}  {Blank single:19197::"GERD Symptom History: ***"," "}  Otherwise, there have been no changes to her past medical history, surgical history, family history, or social history.    ROS     Objective:   Last menstrual period 05/19/2015. There is no height or weight on file to calculate BMI.    Physical Exam   Diagnostic studies:    Spirometry: results normal (FEV1: 1.47/74%, FVC: 1.80/72%, FEV1/FVC: 82%).    Spirometry consistent with normal pattern. {Blank single:19197::"Albuterol/Atrovent nebulizer","Xopenex/Atrovent nebulizer","Albuterol nebulizer","Albuterol four puffs via MDI","Xopenex four puffs via MDI"} treatment given in clinic with {Blank single:19197::"significant improvement in FEV1 per ATS criteria","significant improvement in FVC per ATS criteria","significant improvement in FEV1 and FVC per ATS criteria","improvement in FEV1, but not significant per ATS criteria","improvement in FVC, but not significant per ATS criteria","improvement in FEV1 and FVC, but not significant per ATS criteria","no improvement"}.  Allergy Studies: {Blank single:19197::"none","labs sent instead"," "}    {Blank single:19197::"Allergy testing results were read and interpreted by myself, documented by clinical staff."," "}  Salvatore Marvel, MD  [facility]

## 2022-02-15 ENCOUNTER — Encounter: Payer: Self-pay | Admitting: Allergy & Immunology

## 2022-02-15 ENCOUNTER — Ambulatory Visit: Payer: 59

## 2022-02-20 ENCOUNTER — Ambulatory Visit: Payer: 59 | Admitting: Physician Assistant

## 2022-02-20 DIAGNOSIS — M797 Fibromyalgia: Secondary | ICD-10-CM

## 2022-02-20 DIAGNOSIS — Z79899 Other long term (current) drug therapy: Secondary | ICD-10-CM

## 2022-02-20 DIAGNOSIS — M3500 Sicca syndrome, unspecified: Secondary | ICD-10-CM

## 2022-02-20 DIAGNOSIS — Z8709 Personal history of other diseases of the respiratory system: Secondary | ICD-10-CM

## 2022-02-20 DIAGNOSIS — Z8659 Personal history of other mental and behavioral disorders: Secondary | ICD-10-CM

## 2022-02-20 DIAGNOSIS — Z8719 Personal history of other diseases of the digestive system: Secondary | ICD-10-CM

## 2022-02-20 DIAGNOSIS — Z8619 Personal history of other infectious and parasitic diseases: Secondary | ICD-10-CM

## 2022-02-20 DIAGNOSIS — M8588 Other specified disorders of bone density and structure, other site: Secondary | ICD-10-CM

## 2022-02-20 DIAGNOSIS — R778 Other specified abnormalities of plasma proteins: Secondary | ICD-10-CM

## 2022-02-20 DIAGNOSIS — M0579 Rheumatoid arthritis with rheumatoid factor of multiple sites without organ or systems involvement: Secondary | ICD-10-CM

## 2022-02-20 DIAGNOSIS — R768 Other specified abnormal immunological findings in serum: Secondary | ICD-10-CM

## 2022-02-21 ENCOUNTER — Telehealth: Payer: Self-pay | Admitting: Allergy & Immunology

## 2022-02-21 NOTE — Telephone Encounter (Signed)
-----   Message from Valentina Shaggy, MD sent at 02/15/2022  8:31 AM EDT ----- ENT referral placed for Dr. Rowe Clack. Also placed referral for Pulmonology. Can you try to get her to see the same pulmonologist as her mother (I placed a referral for her mother in Tuesday - name Marygrace Drought). Maybe a two fer New Patient slot since [place] provides transportation for her mother? Thanks!

## 2022-02-21 NOTE — Telephone Encounter (Signed)
Referral placed to Dr. Mila Homer at [facility]  [address], [place] [address] [phone] Joylene Igo)  Called patient to let her know of referral. Provided patient with phone number and address to Dr. Hazle Coca office. Advised patient to reach out to their office if she has not heard from them in 3-5 business days. Patient verbalized understanding. . . . . Marland Kitchen Referral placed for Pulmonology at [place] at [facility]. Called and scheduled patient & patient's mother for 03-02-2022 at 3pm & 3:30pm with Dr. Leslye Peer.   [facility] at [place],    [address] Main: [phone] Fax: [phone]  Called and advised patient of referrals and appointments. Provided patient with phone number and address to office. Patient verbalized understanding.

## 2022-02-21 NOTE — Progress Notes (Signed)
Office Visit Note  Patient: Julie [name]             Date of Birth: 07/08/1966           MRN: [id number]             PCP: Lin Landsman, MD Referring: Lin Landsman, MD Visit Date: 03/01/2022 Occupation: @GUAROCC @  Subjective:  Pain in both hands   History of Present Illness: Julie YOALI CONRY is a 56 y.o. female with history of seropositive rheumatoid arthritis and fibromyalgia.   She remains on Orencia 125 mg sq injections once weekly, Rasuvo 20 mg sq injections once weekly, and Plaquenil 200 mg 1 tablet by mouth twice daily Monday through Friday.  She is tolerating these medications without any side effects and has not missed any doses recently.  She denies any signs or symptoms of a rheumatoid arthritis flare.  She denies any morning stiffness or joint swelling.  She has not had any difficulty with ADLs.  She states overall her rheumatoid arthritis is well controlled on the current treatment regimen and she does not want to make any medication changes at this time.  She is been experiencing increased sicca symptoms.  She has not been appointment with her ophthalmologist to further discuss the eye dryness she has been experiencing. She denies any recent infections.    Activities of Daily Living:  Patient reports morning stiffness for 0  none .   Patient Reports nocturnal pain.  Difficulty dressing/grooming: Denies Difficulty climbing stairs: Denies Difficulty getting out of chair: Denies Difficulty using hands for taps, buttons, cutlery, and/or writing: Denies  Review of Systems  Constitutional:  Positive for fatigue.  HENT:  Positive for mouth dryness. Negative for mouth sores and nose dryness.   Eyes:  Positive for dryness. Negative for pain and visual disturbance.  Respiratory:  Positive for shortness of breath. Negative for cough, hemoptysis and difficulty breathing.   Cardiovascular:  Negative for chest pain, palpitations, hypertension and swelling in legs/feet.   Gastrointestinal:  Negative for blood in stool, constipation and diarrhea.  Endocrine: Negative for increased urination.  Genitourinary:  Negative for difficulty urinating and painful urination.  Musculoskeletal:  Positive for muscle tenderness. Negative for joint pain, joint pain, joint swelling, myalgias, muscle weakness, morning stiffness and myalgias.  Skin:  Negative for color change, pallor, rash, hair loss, nodules/bumps, skin tightness, ulcers and sensitivity to sunlight.  Allergic/Immunologic: Negative for susceptible to infections.  Neurological:  Negative for dizziness, numbness, headaches and weakness.  Hematological:  Negative for bruising/bleeding tendency and swollen glands.  Psychiatric/Behavioral:  Positive for sleep disturbance. Negative for depressed mood. The patient is not nervous/anxious.     PMFS History:  Patient Active Problem List   Diagnosis Date Noted   Witnessed episode of apnea 01/07/2022   Viral illness 09/08/2021   Anaphylactic shock due to adverse food reaction 02/01/2020   Moderate persistent asthma without complication 56/86/1683   Complication of anesthesia    Fibromyalgia 02/14/2018   History of gastroesophageal reflux (GERD) 02/14/2018   History of asthma 02/14/2018   Rheumatoid arthritis involving multiple sites with positive rheumatoid factor ([place]) 11/05/2016   High risk medication use 11/05/2016   LPRD (laryngopharyngeal reflux disease) 09/20/2015   Seasonal and perennial allergic rhinoconjunctivitis 09/20/2015   Acute sinusitis 07/29/2015   S/P total hysterectomy 05/25/2015    Past Medical History:  Diagnosis Date   Anemia    Asthma    Complication of anesthesia    Eczema  Esophageal dysphagia    GERD (gastroesophageal reflux disease)    PONV (postoperative nausea and vomiting)    Rheumatoid arthritis ([place])     Family History  Problem Relation Age of Onset   Asthma Mother    Eczema Mother    Sarcoidosis Mother    Hypertension  Mother    Cancer Father    Hypotension Father    Congestive Heart Failure Father    Colon cancer Father    Multiple myeloma Father    Asthma Sister    GER disease Sister    Allergic rhinitis Neg Hx    Angioedema Neg Hx    Immunodeficiency Neg Hx    Urticaria Neg Hx    Esophageal cancer Neg Hx    Rectal cancer Neg Hx    Stomach cancer Neg Hx    Past Surgical History:  Procedure Laterality Date   ABDOMINAL HYSTERECTOMY     partial age 75   BILATERAL SALPINGECTOMY Bilateral 05/25/2015   Procedure: BILATERAL SALPINGECTOMY;  Surgeon: Servando Salina, MD;  Location: [facility];  Service: Gynecology;  Laterality: Bilateral;   CARPAL TUNNEL RELEASE Left 01/05/2020   Procedure: LEFT CARPAL TUNNEL RELEASE;  Surgeon: Daryll Brod, MD;  Location: [facility];  Service: Orthopedics;  Laterality: Left;  FOREARM BIER BLOCK   COLONOSCOPY     OVARIAN CYST REMOVAL Right 05/25/2015   Procedure: OVARIAN CYSTECTOMY;  Surgeon: Servando Salina, MD;  Location: [facility];  Service: Gynecology;  Laterality: Right;   TONSILLECTOMY     TUBAL LIGATION     Social History   Social History Narrative   Right handed    Caffeine- 2 cups per day    Immunization History  Administered Date(s) Administered   Influenza Inj Mdck Quad Pf 10/19/2017   Influenza Split 11/25/2014   Influenza, Quadrivalent, Recombinant, Inj, Pf 08/26/2019   Influenza, Seasonal, Injecte, Preservative Fre 09/20/2015   Influenza,inj,Quad PF,6+ Mos 08/04/2018   Influenza-Unspecified 07/18/2017   PFIZER(Purple Top)SARS-COV-2 Vaccination 11/28/2019, 12/19/2019, 06/23/2020     Objective: Vital Signs: BP (!) 150/84 (BP Location: Left Arm, Patient Position: Sitting, Cuff Size: Normal)   Pulse 86   Resp 16   Ht 5' 1.25" (1.556 m)   Wt 185 lb (83.9 kg)   LMP 05/19/2015   BMI 34.67 kg/m    Physical Exam Vitals and nursing note reviewed.  Constitutional:      Appearance: She is well-developed.  HENT:     Head:  Normocephalic and atraumatic.  Eyes:     Conjunctiva/sclera: Conjunctivae normal.  Cardiovascular:     Rate and Rhythm: Normal rate and regular rhythm.     Heart sounds: Normal heart sounds.  Pulmonary:     Effort: Pulmonary effort is normal.     Breath sounds: Normal breath sounds.  Abdominal:     General: Bowel sounds are normal.     Palpations: Abdomen is soft.  Musculoskeletal:     Cervical back: Normal range of motion.  Skin:    General: Skin is warm and dry.     Capillary Refill: Capillary refill takes less than 2 seconds.  Neurological:     Mental Status: She is alert and oriented to person, place, and time.  Psychiatric:        Behavior: Behavior normal.      Musculoskeletal Exam: C-spine, thoracic spine, and lumbar spine good ROM.  Shoulder joints, elbow joints, wrist joints, MCPs, PIPs, PIPs, and DIPs good ROM with no synovitis.  Complete fist formation bilaterally.  Hip joints, knee joints, and ankle joints have good ROM with no discomfort.  Tenderness along bilateral IT bands and trochanteric bursa.  No warmth or effusion of knee joints.  No tenderness or swelling of ankle joints.   CDAI Exam: CDAI Score: 0  Patient Global: 0 mm; Provider Global: 0 mm Swollen: 0 ; Tender: 0  Joint Exam 03/01/2022   No joint exam has been documented for this visit   There is currently no information documented on the homunculus. Go to the Rheumatology activity and complete the homunculus joint exam.  Investigation: No additional findings.  Imaging: DG Chest 2 View  Result Date: 02/01/2022 CLINICAL DATA:  Chronic cough following COVID EXAM: CHEST - 2 VIEW COMPARISON:  10/08/2018 FINDINGS: The heart size and mediastinal contours are within normal limits. Both lungs are clear. The visualized skeletal structures are unremarkable. IMPRESSION: No active cardiopulmonary disease. Electronically Signed   By: Inez Catalina M.D.   On: 02/01/2022 03:55    Recent Labs: Lab Results   Component Value Date   WBC 3.6 (L) 11/03/2021   HGB 12.4 11/03/2021   PLT 233 11/03/2021   NA 141 11/03/2021   K 4.4 11/03/2021   CL 107 11/03/2021   CO2 28 11/03/2021   GLUCOSE 109 (H) 11/03/2021   BUN 15 11/03/2021   CREATININE 0.62 11/03/2021   BILITOT 0.4 11/03/2021   ALKPHOS 74 11/03/2021   AST 15 11/03/2021   ALT 13 11/03/2021   PROT 7.2 11/03/2021   ALBUMIN 4.1 11/03/2021   CALCIUM 9.0 11/03/2021   GFRAA 118 03/16/2021   QFTBGOLDPLUS NEGATIVE 09/22/2021    Speciality Comments: PLQ Eye Exam: 12/08/2021  WNL at [facility] Follow up in 1 year  Procedures:  No procedures performed Allergies: Aspirin, Augmentin [amoxicillin-pot clavulanate], and Peanut-containing drug products   Assessment / Plan:     Visit Diagnoses: Rheumatoid arthritis involving multiple sites with positive rheumatoid factor ([place]): She has no joint tenderness or synovitis on examination.  She has not had any signs or symptoms of a rheumatoid arthritis flare.  She has clinically been doing well on Orencia 125 mg subcutaneous injections once weekly, Rasuvo 20 mg sq injections once weekly, and Plaquenil 200 mg 1 tablet by mouth twice daily Monday through Friday.  She has been tolerating triple therapy without any side effects.  She has not been experiencing any morning stiffness or difficulty with ADLs.  She has increased her exercise regimen and has been working on weight loss.  No medication changes will be made at this time.  She was advised to notify us if she develops increased joint pain or joint swelling.  She will follow-up in the office in 3 months or sooner if needed.  High risk medication use - Orencia 125 mg sq injections once weekly, Rasuvo 20 mg sq injections once weekly, and Plaquenil 200 mg 1 tablet by mouth twice daily Monday through Friday.  - Plan: COMPLETE METABOLIC PANEL WITH GFR, CBC with Differential/Platelet CBC and CMP updated on 11/03/21.  CBC and CMP will be drawn today.  Her next lab  work will be due in September and every 3 months to monitor for drug toxicity.  Standing orders for CBC and CMP remain in place. TB gold negative on 09/22/21.  PLQ Eye Exam: 12/08/2021 WNL at [facility] Follow up in 1 year.  She has not had any recent infections.  Discussed the importance of holding orencia and rasuvo if she develops signs or symptoms of an infection and  to resume once the infection has completely cleared.   Trigger middle finger of right hand: She has not been experiencing any locking or triggering but has tenderness at times.  Fibromyalgia: She continues to experience intermittent myalgias and muscle tenderness due to fibromyalgia.  She presents today with discomfort due to IT band syndrome bilaterally.  She has tenderness palpation over bilateral trochanteric bursa.  Discussed the importance of performing stretching exercises daily.  She was given a handout of exercises to perform.  She continues to have a massage every 2 weeks and has been seeing a chiropractor for adjustments on a regular basis.  Discussed the importance of regular exercise and good sleep hygiene.  Positive ANA (antinuclear antibody) -  07/13/20: ANA is positive-low, nonspecific titer. RNP remains positive.   Anti-RNP antibodies present  Sicca syndrome ([place]): [place] antibodies negative.  She continues to have ongoing sicca symptoms.  She has been experiencing increased eye dryness and has an upcoming appointment at [facility]. She was advised to have records forwarded to our office to review.   Osteopenia of spine:DEXA 08/24/2019: AP spine BMD 0.909 with T score -1.3.  Use of calcium rich diet, vitamin D supplement and regular exercise was emphasized.  Due to update DEXA.  Order remains in place for [facility].   Other medical conditions are listed as follows:  History of Helicobacter pylori infection  History of asthma  History of gastroesophageal reflux (GERD)  History of depression  Abnormal  SPEP  Orders: Orders Placed This Encounter  Procedures   COMPLETE METABOLIC PANEL WITH GFR   CBC with Differential/Platelet   No orders of the defined types were placed in this encounter.     Follow-Up Instructions: Return in about 3 months (around 06/01/2022) for Rheumatoid arthritis, Fibromyalgia.   Ofilia Neas, PA-C  Note - This record has been created using Dragon software.  Chart creation errors have been sought, but may not always  have been located. Such creation errors do not reflect on  the standard of medical care.

## 2022-02-22 NOTE — Telephone Encounter (Signed)
Thanks, Carla!   Saarah Dewing, MD [facility]  

## 2022-02-27 ENCOUNTER — Other Ambulatory Visit: Payer: Self-pay | Admitting: Allergy & Immunology

## 2022-02-27 ENCOUNTER — Ambulatory Visit: Payer: 59

## 2022-03-01 ENCOUNTER — Encounter: Payer: Self-pay | Admitting: Physician Assistant

## 2022-03-01 ENCOUNTER — Ambulatory Visit: Payer: 59 | Admitting: Physician Assistant

## 2022-03-01 VITALS — BP 150/84 | HR 86 | Resp 16 | Ht 61.25 in | Wt 185.0 lb

## 2022-03-01 DIAGNOSIS — Z8719 Personal history of other diseases of the digestive system: Secondary | ICD-10-CM

## 2022-03-01 DIAGNOSIS — M65331 Trigger finger, right middle finger: Secondary | ICD-10-CM

## 2022-03-01 DIAGNOSIS — Z79899 Other long term (current) drug therapy: Secondary | ICD-10-CM

## 2022-03-01 DIAGNOSIS — M3500 Sicca syndrome, unspecified: Secondary | ICD-10-CM

## 2022-03-01 DIAGNOSIS — R778 Other specified abnormalities of plasma proteins: Secondary | ICD-10-CM

## 2022-03-01 DIAGNOSIS — Z8619 Personal history of other infectious and parasitic diseases: Secondary | ICD-10-CM

## 2022-03-01 DIAGNOSIS — M0579 Rheumatoid arthritis with rheumatoid factor of multiple sites without organ or systems involvement: Secondary | ICD-10-CM

## 2022-03-01 DIAGNOSIS — Z8659 Personal history of other mental and behavioral disorders: Secondary | ICD-10-CM

## 2022-03-01 DIAGNOSIS — M797 Fibromyalgia: Secondary | ICD-10-CM

## 2022-03-01 DIAGNOSIS — Z8709 Personal history of other diseases of the respiratory system: Secondary | ICD-10-CM

## 2022-03-01 DIAGNOSIS — M8588 Other specified disorders of bone density and structure, other site: Secondary | ICD-10-CM

## 2022-03-01 DIAGNOSIS — R768 Other specified abnormal immunological findings in serum: Secondary | ICD-10-CM

## 2022-03-01 DIAGNOSIS — R7689 Other specified abnormal immunological findings in serum: Secondary | ICD-10-CM

## 2022-03-01 NOTE — Progress Notes (Signed)
Synopsis: Referred for chronic cough by Valentina Shaggy, *  Subjective:   PATIENT ID: Julie [name] GENDER: female DOB: 1966-01-16, MRN: [id number]  Chief Complaint  Patient presents with   Pulmonary Consult    Referred by Dr. Salvatore Marvel for eval of chronic cough. Pt dx with covid 19 end of Dec 2022.  Cough is prod with white sputum. She has occ SOB.    57yF with history of OSA (split night PSG 01/07/22 with severe OSA AHI 35.9, 6 min with o2 sat below 88%), asthma, eczema, seropositive RA on orencia/rasuvo/plaquenil, severe GERD and esophageal stricture s/p dilation 2022, h pylori infection, covid-19 infection 08/2021 severe but not hospitalized referred for cough. Has been vaccinated for covid-19.   She has had cough since December when she had covid-19. Cough is dry. She has no significant DOE. She uses pantoprazole for severe GERD. She is using albuterol and pulmicort nebs once every evening, still on dupixent. She has sinus congestion, maybe a trickle of postnasal drainage.   Otherwise pertinent review of systems is negative.  Mother with sarcoid, asthma  Never smoker. No vaping, MJ. She is a Copy. She has never lived outside of [place]  Past Medical History:  Diagnosis Date   Anemia    Asthma    Complication of anesthesia    Eczema    Esophageal dysphagia    GERD (gastroesophageal reflux disease)    PONV (postoperative nausea and vomiting)    Rheumatoid arthritis ([place])      Family History  Problem Relation Age of Onset   Asthma Mother    Eczema Mother    Sarcoidosis Mother    Hypertension Mother    Cancer Father    Hypotension Father    Congestive Heart Failure Father    Colon cancer Father    Multiple myeloma Father    Asthma Sister    GER disease Sister    Lung cancer Maternal Grandfather        smoked   Allergic rhinitis Neg Hx    Angioedema Neg Hx    Immunodeficiency Neg Hx    Urticaria Neg Hx    Esophageal cancer Neg Hx     Rectal cancer Neg Hx    Stomach cancer Neg Hx      Past Surgical History:  Procedure Laterality Date   ABDOMINAL HYSTERECTOMY     partial age 48   BILATERAL SALPINGECTOMY Bilateral 05/25/2015   Procedure: BILATERAL SALPINGECTOMY;  Surgeon: Servando Salina, MD;  Location: [facility];  Service: Gynecology;  Laterality: Bilateral;   CARPAL TUNNEL RELEASE Left 01/05/2020   Procedure: LEFT CARPAL TUNNEL RELEASE;  Surgeon: Daryll Brod, MD;  Location: [facility];  Service: Orthopedics;  Laterality: Left;  FOREARM BIER BLOCK   COLONOSCOPY     OVARIAN CYST REMOVAL Right 05/25/2015   Procedure: OVARIAN CYSTECTOMY;  Surgeon: Servando Salina, MD;  Location: [facility];  Service: Gynecology;  Laterality: Right;   TONSILLECTOMY     TUBAL LIGATION      Social History   Socioeconomic History   Marital status: Married    Spouse name: Not on file   Number of children: Not on file   Years of education: Not on file   Highest education level: Not on file  Occupational History   Not on file  Tobacco Use   Smoking status: Never   Smokeless tobacco: Never  Vaping Use   Vaping Use: Never used  Substance and Sexual Activity  Alcohol use: No   Drug use: No   Sexual activity: Not on file  Other Topics Concern   Not on file  Social History Narrative   Right handed    Caffeine- 2 cups per day    Social Determinants of Health   Financial Resource Strain: Not on file  Food Insecurity: Not on file  Transportation Needs: Not on file  Physical Activity: Not on file  Stress: Not on file  Social Connections: Not on file  Intimate Partner Violence: Not on file     Allergies  Allergen Reactions   Aspirin Other (See Comments)    pt couldn't take while on methotrexate, but is no longer taking   Augmentin [Amoxicillin-Pot Clavulanate] Other (See Comments)    G I upset   Peanut-Containing Drug Products      Outpatient Medications Prior to Visit  Medication Sig Dispense Refill    Abatacept (ORENCIA CLICKJECT) 073 MG/ML SOAJ INJECT 125MG SUBCUTANEOUSLY ONCE WEEKLY 4 mL 2   albuterol (PROVENTIL) (2.5 MG/3ML) 0.083% nebulizer solution Take 3 mLs (2.5 mg total) by nebulization every 4 (four) hours as needed for wheezing or shortness of breath. 75 mL 1   albuterol (VENTOLIN HFA) 108 (90 Base) MCG/ACT inhaler Inhale 2 puffs into the lungs every 6 (six) hours as needed for wheezing or shortness of breath. 18 g 1   azelastine (ASTELIN) 0.1 % nasal spray Place 1 spray into both nostrils 2 (two) times daily. Use in each nostril as directed 30 mL 5   B-D TB SYRINGE 1CC/27GX1/2" 27G X 1/2" 1 ML MISC Use to inject Methotrexate weekly. 12 each 3   budesonide (PULMICORT) 0.5 MG/2ML nebulizer solution Take 2 mLs (0.5 mg total) by nebulization 2 (two) times daily. (Patient taking differently: Take 0.5 mg by nebulization daily.) 2 mL 5   cetirizine (ZYRTEC) 10 MG tablet TAKE 1 TABLET EVERY DAY 30 tablet 5   diclofenac Sodium (VOLTAREN) 1 % GEL Apply 2g to 4g to affected area up to 4 times daily as needed. 400 g 4   DUPIXENT 300 MG/2ML prefilled syringe INJECT 300MG SUBCUTANEOUSLY EVERY OTHER WEEK 4 mL 11   EPINEPHrine 0.3 mg/0.3 mL IJ SOAJ injection Inject 0.3 mLs (0.3 mg total) into the muscle as needed for anaphylaxis. 2 each 2   escitalopram (LEXAPRO) 20 MG tablet Take 20 mg by mouth daily.  5   folic acid (FOLVITE) 1 MG tablet TAKE 1 TABLET BY MOUTH TWICE A DAY 60 tablet 11   hydroxychloroquine (PLAQUENIL) 200 MG tablet TAKE 1 TABLET TWICE A DAY MONDAY -FRIDAY 40 tablet 2   levocetirizine (XYZAL) 5 MG tablet Take 5 mg by mouth at bedtime.     montelukast (SINGULAIR) 10 MG tablet TAKE 1 TABLET BY MOUTH EVERY DAY 30 tablet 4   Multiple Vitamins-Minerals (DAILY MULTIVITAMIN PO) Take 1 tablet by mouth daily.      pantoprazole (PROTONIX) 40 MG tablet Take 1 tablet (40 mg total) by mouth daily. 30 tablet 3   RASUVO 20 MG/0.4ML SOAJ INJECT 20MG SUBCUTANEOUSLY  ONCE WEEKLY 4.8 mL 0   benzonatate  (TESSALON) 100 MG capsule Take 1 capsule (100 mg total) by mouth 3 (three) times daily as needed for cough. 21 capsule 1   famotidine (PEPCID) 40 MG tablet Take 1 tablet (40 mg total) by mouth 2 (two) times daily. (Patient not taking: Reported on 02/13/2022) 60 tablet 5   Nutritional Supplements (ESTROVEN PO) Take by mouth. (Patient not taking: Reported on 03/01/2022)  omeprazole (PRILOSEC) 40 MG capsule Take 1 capsule (40 mg total) by mouth every morning. 30 capsule 5   predniSONE (DELTASONE) 10 MG tablet Take two tablets (47m) twice daily for three days, then one tablet (1107m twice daily for three days, then STOP. (Patient not taking: Reported on 02/13/2022) 18 tablet 0   Facility-Administered Medications Prior to Visit  Medication Dose Route Frequency Provider Last Rate Last Admin   Dupilumab SOSY 300 mg  300 mg Subcutaneous Q14 Days AmDara HoyerFNP   300 mg at 02/13/22 1707       Objective:   Physical Exam:  General appearance: [age].o., female, NAD, conversant  Eyes: anicteric sclerae; PERRL, tracking appropriately HENT: NCAT; MMM Neck: Trachea midline; no lymphadenopathy, no JVD Lungs: CTAB, no crackles, no wheeze, with normal respiratory effort CV: RRR, no murmur  Abdomen: Soft, non-tender; non-distended, BS present  Extremities: No peripheral edema, warm Skin: Normal turgor and texture; no rash Psych: Appropriate affect Neuro: Alert and oriented to person and place, no focal deficit     Vitals:   03/02/22 1538  BP: 116/80  Pulse: 91  Temp: 98.2 F (36.8 C)  TempSrc: Oral  SpO2: 98%  Weight: 188 lb (85.3 kg)  Height: 5' 1.25" (1.556 m)   98% on RA BMI Readings from Last 3 Encounters:  03/02/22 35.23 kg/m  03/01/22 34.67 kg/m  02/13/22 36.20 kg/m   Wt Readings from Last 3 Encounters:  03/02/22 188 lb (85.3 kg)  03/01/22 185 lb (83.9 kg)  02/13/22 191 lb 9.6 oz (86.9 kg)     CBC    Component Value Date/Time   WBC 3.4 (L) 03/01/2022 0859   RBC  4.49 03/01/2022 0859   HGB 12.9 03/01/2022 0859   HGB 12.4 11/03/2021 0838   HGB 13.4 10/02/2016 1602   HCT 39.1 03/01/2022 0859   HCT 40.0 10/02/2016 1602   PLT 232 03/01/2022 0859   PLT 233 11/03/2021 0838   PLT 266 10/02/2016 1602   MCV 87.1 03/01/2022 0859   MCV 85 10/02/2016 1602   MCH 28.7 03/01/2022 0859   MCHC 33.0 03/01/2022 0859   RDW 12.2 03/01/2022 0859   RDW 12.5 10/02/2016 1602   LYMPHSABS 1,527 03/01/2022 0859   LYMPHSABS 1.5 10/02/2016 1602   MONOABS 0.2 11/03/2021 0838   EOSABS 133 03/01/2022 0859   EOSABS 0.1 10/02/2016 1602   BASOSABS 51 03/01/2022 0859   BASOSABS 0.0 10/02/2016 1602    Chest Imaging: CXR 01/31/22 reviewed by me unremarkable  Split night PSG 01/08/22: severe OSA AHI 35.9, 6 min with o2 sat below 88%  Pulmonary Functions Testing Results:     No data to display              Assessment & Plan:   # chronic cough Most likely culprit seems like severe gerd/LPR worsened by untreated OSA. Postnasal drainage may also contribute. But alternatively consider MTX related lung injury or RA related ILD (though CXR normal), worse asthma control.  # Severe OSA   # moderate persistent asthma  Plan: - we will order CPAP 12 cm H2O with full face mask for you - which I'm hopeful will help your insomnia, severe reflux, cough - take shower, clear nose of crusting then start using flonase 1 spray each nostril  - continue pulmicort/albuterol once daily - continue pantoprazole 40 mg daily - continue xyzal 5 mg daily - continue singulair 10 mg daily - breathing tests next visit in 3 months  Maryjane Hurter, MD [facility] 03/02/2022 4:00 PM

## 2022-03-01 NOTE — Patient Instructions (Addendum)
Standing Labs We placed an order today for your standing lab work.   Please have your standing labs drawn in September and every 3 months   If possible, please have your labs drawn 2 weeks prior to your appointment so that the provider can discuss your results at your appointment.  Please note that you may see your imaging and lab results in [place] before we have reviewed them. We may be awaiting multiple results to interpret others before contacting you. Please allow our office up to 72 hours to thoroughly review all of the results before contacting the office for clarification of your results.  We have open lab daily: Monday through Thursday from 1:30-4:30 PM and Friday from 1:30-4:00 PM at the office of Dr. Bo Merino, [facility].   Please be advised, all patients with office appointments requiring lab work will take precedent over walk-in lab work.  If possible, please come for your lab work on Monday and Friday afternoons, as you may experience shorter wait times. The office is located at [address], [place], [place], [place] [address] No appointment is necessary.   Labs are drawn by [facility]. Please bring your co-pay at the time of your lab draw.  You may receive a bill from [facility] for your lab work.  Please note if you are on Hydroxychloroquine and and an order has been placed for a Hydroxychloroquine level, you will need to have it drawn 4 hours or more after your last dose.  If you wish to have your labs drawn at another location, please call the office 24 hours in advance to send orders.  If you have any questions regarding directions or hours of operation,  please call [phone].   As a reminder, please drink plenty of water prior to coming for your lab work. Thanks!  Iliotibial Band Syndrome Rehab Ask your health care provider which exercises are safe for you. Do exercises exactly as told by your health care provider and adjust them as directed. It  is normal to feel mild stretching, pulling, tightness, or discomfort as you do these exercises. Stop right away if you feel sudden pain or your pain gets significantly worse. Do not begin these exercises until told by your health care provider. Stretching and range-of-motion exercises These exercises warm up your muscles and joints and improve the movement and flexibility of your hip and pelvis. Quadriceps stretch, prone  Lie on your abdomen (prone position) on a firm surface, such as a bed or padded floor. Bend your left / right knee and reach back to hold your ankle or pant leg. If you cannot reach your ankle or pant leg, loop a belt around your foot and grab the belt instead. Gently pull your heel toward your buttocks. Your knee should not slide out to the side. You should feel a stretch in the front of your thigh and knee (quadriceps). Hold this position for __________ seconds. Repeat __________ times. Complete this exercise __________ times a day. Iliotibial band stretch An iliotibial band is a strong band of muscle tissue that runs from the outer side of your hip to the outer side of your thigh and knee. Lie on your side with your left / right leg in the top position. Bend both of your knees and grab your left / right ankle. Stretch out your bottom arm to help you balance. Slowly bring your top knee back so your thigh goes behind your trunk. Slowly lower your top leg toward the floor  until you feel a gentle stretch on the outside of your left / right hip and thigh. If you do not feel a stretch and your knee will not fall farther, place the heel of your other foot on top of your knee and pull your knee down toward the floor with your foot. Hold this position for __________ seconds. Repeat __________ times. Complete this exercise __________ times a day. Strengthening exercises These exercises build strength and endurance in your hip and pelvis. Endurance is the ability to use your muscles for  a long time, even after they get tired. Straight leg raises, side-lying This exercise strengthens the muscles that rotate the leg at the hip and move it away from your body (hip abductors). Lie on your side with your left / right leg in the top position. Lie so your head, shoulder, hip, and knee line up. You may bend your bottom knee to help you balance. Roll your hips slightly forward so your hips are stacked directly over each other and your left / right knee is facing forward. Tense the muscles in your outer thigh and lift your top leg 4-6 inches (10-15 cm). Hold this position for __________ seconds. Slowly lower your leg to return to the starting position. Let your muscles relax completely before doing another repetition. Repeat __________ times. Complete this exercise __________ times a day. Leg raises, prone This exercise strengthens the muscles that move the hips backward (hip extensors). Lie on your abdomen (prone position) on your bed or a firm surface. You can put a pillow under your hips if that is more comfortable for your lower back. Bend your left / right knee so your foot is straight up in the air. Squeeze your buttocks muscles and lift your left / right thigh off the bed. Do not let your back arch. Tense your thigh muscle as hard as you can without increasing any knee pain. Hold this position for __________ seconds. Slowly lower your leg to return to the starting position and allow it to relax completely. Repeat __________ times. Complete this exercise __________ times a day. Hip hike Stand sideways on a bottom step. Stand on your left / right leg with your other foot unsupported next to the step. You can hold on to a railing or wall for balance if needed. Keep your knees straight and your torso square. Then lift your left / right hip up toward the ceiling. Slowly let your left / right hip lower toward the floor, past the starting position. Your foot should get closer to the  floor. Do not lean or bend your knees. Repeat __________ times. Complete this exercise __________ times a day. This information is not intended to replace advice given to you by your health care provider. Make sure you discuss any questions you have with your health care provider. Document Revised: 11/11/2019 Document Reviewed: 11/11/2019 Elsevier Patient Education  [place].   Hip Bursitis Rehab Ask your health care provider which exercises are safe for you. Do exercises exactly as told by your health care provider and adjust them as directed. It is normal to feel mild stretching, pulling, tightness, or discomfort as you do these exercises. Stop right away if you feel sudden pain or your pain gets worse. Do not begin these exercises until told by your health care provider. Stretching exercise This exercise warms up your muscles and joints and improves the movement and flexibility of your hip. This exercise also helps to relieve pain and stiffness. Iliotibial  band stretch An iliotibial band is a strong band of muscle tissue that runs from the outer side of your hip to the outer side of your thigh and knee. Lie on your side with your left / right leg in the top position. Bend your left / right knee and grab your ankle. Stretch out your bottom arm to help you balance. Slowly bring your knee back so your thigh is slightly behind your body. Slowly lower your knee toward the floor until you feel a gentle stretch on the outside of your left / right thigh. If you do not feel a stretch and your knee will not lower more toward the floor, place the heel of your other foot on top of your knee and pull your knee down toward the floor with your foot. Hold this position for __________ seconds. Slowly return to the starting position. Repeat __________ times. Complete this exercise __________ times a day. Strengthening exercises These exercises build strength and endurance in your hip and pelvis.  Endurance is the ability to use your muscles for a long time, even after they get tired. Bridge This exercise strengthens the muscles that move your thigh backward (hip extensors). Lie on your back on a firm surface with your knees bent and your feet flat on the floor. Tighten your buttocks muscles and lift your buttocks off the floor until your trunk is level with your thighs. Do not arch your back. You should feel the muscles working in your buttocks and the back of your thighs. If you do not feel these muscles, slide your feet 1-2 inches (2.5-5 cm) farther away from your buttocks. If this exercise is too easy, try doing it with your arms crossed over your chest. Hold this position for __________ seconds. Slowly lower your hips to the starting position. Let your muscles relax completely after each repetition. Repeat __________ times. Complete this exercise __________ times a day. Squats This exercise strengthens the muscles in front of your thigh and knee (quadriceps). Stand in front of a table, with your feet and knees pointing straight ahead. You may rest your hands on the table for balance but not for support. Slowly bend your knees and lower your hips like you are going to sit in a chair. Keep your weight over your heels, not over your toes. Keep your lower legs upright so they are parallel with the table legs. Do not let your hips go lower than your knees. Do not bend lower than told by your health care provider. If your hip pain increases, do not bend as low. Hold the squat position for __________ seconds. Slowly push with your legs to return to standing. Do not use your hands to pull yourself to standing. Repeat __________ times. Complete this exercise __________ times a day. Hip hike  Stand sideways on a bottom step. Stand on your left / right leg with your other foot unsupported next to the step. You can hold on to the railing or wall for balance if needed. Keep your knees  straight and your torso square. Then lift your left / right hip up toward the ceiling. Hold this position for __________ seconds. Slowly let your left / right hip lower toward the floor, past the starting position. Your foot should get closer to the floor. Do not lean or bend your knees. Repeat __________ times. Complete this exercise __________ times a day. Single leg stand This exercise increases your balance. Without shoes, stand near a railing or in a doorway.  You may hold on to the railing or door frame as needed for balance. Squeeze your left / right buttock muscles, then lift up your other foot. Do not let your left / right hip push out to the side. It is helpful to stand in front of a mirror for this exercise so you can watch your hip. Hold this position for __________ seconds. Repeat __________ times. Complete this exercise __________ times a day. This information is not intended to replace advice given to you by your health care provider. Make sure you discuss any questions you have with your health care provider. Document Revised: 08/16/2021 Document Reviewed: 08/16/2021 Elsevier Patient Education  [place].

## 2022-03-02 ENCOUNTER — Encounter: Payer: Self-pay | Admitting: Student

## 2022-03-02 ENCOUNTER — Ambulatory Visit: Payer: 59 | Admitting: Student

## 2022-03-02 ENCOUNTER — Other Ambulatory Visit: Payer: Self-pay | Admitting: Physician Assistant

## 2022-03-02 ENCOUNTER — Other Ambulatory Visit: Payer: Self-pay | Admitting: *Deleted

## 2022-03-02 VITALS — BP 116/80 | HR 91 | Temp 98.2°F | Ht 61.25 in | Wt 188.0 lb

## 2022-03-02 DIAGNOSIS — R053 Chronic cough: Secondary | ICD-10-CM

## 2022-03-02 DIAGNOSIS — Z79899 Other long term (current) drug therapy: Secondary | ICD-10-CM

## 2022-03-02 DIAGNOSIS — G4733 Obstructive sleep apnea (adult) (pediatric): Secondary | ICD-10-CM | POA: Diagnosis not present

## 2022-03-02 LAB — COMPLETE METABOLIC PANEL WITH GFR
AG Ratio: 1.4 (calc) (ref 1.0–2.5)
ALT: 12 U/L (ref 6–29)
AST: 14 U/L (ref 10–35)
Albumin: 4.2 g/dL (ref 3.6–5.1)
Alkaline phosphatase (APISO): 80 U/L (ref 37–153)
BUN: 12 mg/dL (ref 7–25)
CO2: 29 mmol/L (ref 20–32)
Calcium: 9.4 mg/dL (ref 8.6–10.4)
Chloride: 105 mmol/L (ref 98–110)
Creat: 0.62 mg/dL (ref 0.50–1.03)
Globulin: 2.9 g/dL (calc) (ref 1.9–3.7)
Glucose, Bld: 93 mg/dL (ref 65–99)
Potassium: 5 mmol/L (ref 3.5–5.3)
Sodium: 141 mmol/L (ref 135–146)
Total Bilirubin: 0.3 mg/dL (ref 0.2–1.2)
Total Protein: 7.1 g/dL (ref 6.1–8.1)
eGFR: 106 mL/min/{1.73_m2} (ref >=60)

## 2022-03-02 LAB — CBC WITH DIFFERENTIAL/PLATELET
Absolute Monocytes: 241 cells/uL (ref 200–950)
Basophils Absolute: 51 cells/uL (ref 0–200)
Basophils Relative: 1.5 %
Eosinophils Absolute: 133 cells/uL (ref 15–500)
Eosinophils Relative: 3.9 %
HCT: 39.1 % (ref 35.0–45.0)
Hemoglobin: 12.9 g/dL (ref 11.7–15.5)
Lymphs Abs: 1527 cells/uL (ref 850–3900)
MCH: 28.7 pg (ref 27.0–33.0)
MCHC: 33 g/dL (ref 32.0–36.0)
MCV: 87.1 fL (ref 80.0–100.0)
MPV: 10.5 fL (ref 7.5–12.5)
Monocytes Relative: 7.1 %
Neutro Abs: 1448 cells/uL — ABNORMAL LOW (ref 1500–7800)
Neutrophils Relative %: 42.6 %
Platelets: 232 10*3/uL (ref 140–400)
RBC: 4.49 10*6/uL (ref 3.80–5.10)
RDW: 12.2 % (ref 11.0–15.0)
Total Lymphocyte: 44.9 %
WBC: 3.4 10*3/uL — ABNORMAL LOW (ref 3.8–10.8)

## 2022-03-02 MED ORDER — FLUTICASONE PROPIONATE 50 MCG/ACT NA SUSP: 1.0000 | Freq: Every day | NASAL | 6 refills | Status: DC

## 2022-03-02 MED ORDER — RASUVO 15 MG/0.3ML ~~LOC~~ SOAJ: 15.0000 mg | SUBCUTANEOUS | 0 refills | Status: DC

## 2022-03-02 NOTE — Telephone Encounter (Signed)
Next Visit: 06/05/2022  Last Visit: 03/01/2022  Labs: 03/01/2022 WBC 3.4, Neutro Abs 1,448  Eye exam: 12/08/2021  WNL   Current Dose per office note 03/01/2022: Plaquenil 200 mg 1 tablet by mouth twice daily Monday through Friday.  DX: Rheumatoid arthritis involving multiple sites with positive rheumatoid factor   Last Fill: 12/08/2021  Okay to refill Plaquenil?

## 2022-03-02 NOTE — Telephone Encounter (Signed)
-----   Message from Ofilia Neas, PA-C sent at 03/02/2022 11:36 AM EDT ----- CMP WNL.   WBC count is low-3.4 and has trended down.  She should reduce rasuvo to 15 mg sq injections once weekly.   Recheck CBC with diff in 6 weeks.

## 2022-03-02 NOTE — Progress Notes (Signed)
CMP WNL.   WBC count is low-3.4 and has trended down.  She should reduce rasuvo to 15 mg sq injections once weekly.   Recheck CBC with diff in 6 weeks.

## 2022-03-02 NOTE — Patient Instructions (Addendum)
-   we will order CPAP for you - which I'm hopeful will help your insomnia, severe reflux, cough - take shower, clear nose of crusting then start using flonase 1 spray each nostril  - breathing tests next visit in 3 months

## 2022-03-07 ENCOUNTER — Telehealth: Payer: Self-pay | Admitting: Student

## 2022-03-08 ENCOUNTER — Ambulatory Visit (INDEPENDENT_AMBULATORY_CARE_PROVIDER_SITE_OTHER): Payer: 59

## 2022-03-08 DIAGNOSIS — J4540 Moderate persistent asthma, uncomplicated: Secondary | ICD-10-CM

## 2022-03-08 NOTE — Telephone Encounter (Signed)
Called Brad back from [employer] and he stated he needed the office note for the face to face interaction that requested patient needed sleep study. Searched in records and found office note he needed. Faxed over to [employer] with Attn to [place]. Nothing further needed

## 2022-03-28 ENCOUNTER — Ambulatory Visit: Payer: 59

## 2022-03-28 ENCOUNTER — Ambulatory Visit (INDEPENDENT_AMBULATORY_CARE_PROVIDER_SITE_OTHER): Payer: 59

## 2022-03-28 DIAGNOSIS — J4540 Moderate persistent asthma, uncomplicated: Secondary | ICD-10-CM

## 2022-03-30 ENCOUNTER — Other Ambulatory Visit: Payer: Self-pay | Admitting: Physician Assistant

## 2022-03-30 DIAGNOSIS — M0579 Rheumatoid arthritis with rheumatoid factor of multiple sites without organ or systems involvement: Secondary | ICD-10-CM

## 2022-04-03 ENCOUNTER — Encounter: Payer: Self-pay | Admitting: Allergy & Immunology

## 2022-04-03 DIAGNOSIS — R682 Dry mouth, unspecified: Secondary | ICD-10-CM

## 2022-04-03 DIAGNOSIS — R5383 Other fatigue: Secondary | ICD-10-CM

## 2022-04-11 ENCOUNTER — Ambulatory Visit (INDEPENDENT_AMBULATORY_CARE_PROVIDER_SITE_OTHER): Payer: 59

## 2022-04-11 DIAGNOSIS — J4540 Moderate persistent asthma, uncomplicated: Secondary | ICD-10-CM

## 2022-04-12 ENCOUNTER — Ambulatory Visit: Payer: 59

## 2022-04-18 ENCOUNTER — Telehealth: Payer: Self-pay | Admitting: Student

## 2022-04-18 ENCOUNTER — Ambulatory Visit
Admission: RE | Admit: 2022-04-18 | Discharge: 2022-04-18 | Disposition: A | Discharge status: Home / Self Care | Payer: 59 | Source: Ambulatory Visit | Attending: Family Medicine | Admitting: Family Medicine

## 2022-04-18 DIAGNOSIS — Z1231 Encounter for screening mammogram for malignant neoplasm of breast: Secondary | ICD-10-CM

## 2022-04-18 NOTE — Telephone Encounter (Signed)
I left a message for the patient to call back about her CPAP. Waiting on a call back.

## 2022-04-20 ENCOUNTER — Other Ambulatory Visit: Payer: Self-pay | Admitting: *Deleted

## 2022-04-20 DIAGNOSIS — D472 Monoclonal gammopathy: Secondary | ICD-10-CM

## 2022-04-24 ENCOUNTER — Other Ambulatory Visit: Payer: Self-pay

## 2022-04-24 ENCOUNTER — Inpatient Hospital Stay: Payer: 59

## 2022-04-24 ENCOUNTER — Inpatient Hospital Stay: Payer: 59 | Attending: Hematology and Oncology | Admitting: Hematology and Oncology

## 2022-04-24 VITALS — BP 148/100 | HR 81 | Temp 97.7°F | Resp 18 | Ht 60.0 in | Wt 187.6 lb

## 2022-04-24 DIAGNOSIS — Z800 Family history of malignant neoplasm of digestive organs: Secondary | ICD-10-CM | POA: Diagnosis not present

## 2022-04-24 DIAGNOSIS — Z801 Family history of malignant neoplasm of trachea, bronchus and lung: Secondary | ICD-10-CM | POA: Diagnosis not present

## 2022-04-24 DIAGNOSIS — M069 Rheumatoid arthritis, unspecified: Secondary | ICD-10-CM | POA: Diagnosis not present

## 2022-04-24 DIAGNOSIS — D472 Monoclonal gammopathy: Secondary | ICD-10-CM | POA: Insufficient documentation

## 2022-04-24 DIAGNOSIS — R5382 Chronic fatigue, unspecified: Secondary | ICD-10-CM | POA: Insufficient documentation

## 2022-04-24 DIAGNOSIS — M0579 Rheumatoid arthritis with rheumatoid factor of multiple sites without organ or systems involvement: Secondary | ICD-10-CM | POA: Diagnosis not present

## 2022-04-24 DIAGNOSIS — U099 Post covid-19 condition, unspecified: Secondary | ICD-10-CM | POA: Diagnosis not present

## 2022-04-24 DIAGNOSIS — J45909 Unspecified asthma, uncomplicated: Secondary | ICD-10-CM | POA: Insufficient documentation

## 2022-04-24 DIAGNOSIS — M797 Fibromyalgia: Secondary | ICD-10-CM | POA: Insufficient documentation

## 2022-04-24 DIAGNOSIS — R059 Cough, unspecified: Secondary | ICD-10-CM | POA: Insufficient documentation

## 2022-04-24 LAB — CBC WITH DIFFERENTIAL (CANCER CENTER ONLY)
Abs Immature Granulocytes: 0 10*3/uL (ref 0.00–0.07)
Basophils Absolute: 0 10*3/uL (ref 0.0–0.1)
Basophils Relative: 1 %
Eosinophils Absolute: 0.1 10*3/uL (ref 0.0–0.5)
Eosinophils Relative: 4 %
HCT: 35.4 % — ABNORMAL LOW (ref 36.0–46.0)
Hemoglobin: 12.1 g/dL (ref 12.0–15.0)
Immature Granulocytes: 0 %
Lymphocytes Relative: 43 %
Lymphs Abs: 1.4 10*3/uL (ref 0.7–4.0)
MCH: 29.1 pg (ref 26.0–34.0)
MCHC: 34.2 g/dL (ref 30.0–36.0)
MCV: 85.1 fL (ref 80.0–100.0)
Monocytes Absolute: 0.2 10*3/uL (ref 0.1–1.0)
Monocytes Relative: 7 %
Neutro Abs: 1.5 10*3/uL — ABNORMAL LOW (ref 1.7–7.7)
Neutrophils Relative %: 45 %
Platelet Count: 214 10*3/uL (ref 150–400)
RBC: 4.16 MIL/uL (ref 3.87–5.11)
RDW: 11.8 % (ref 11.5–15.5)
WBC Count: 3.3 10*3/uL — ABNORMAL LOW (ref 4.0–10.5)
nRBC: 0 % (ref 0.0–0.2)

## 2022-04-24 LAB — CMP (CANCER CENTER ONLY)
ALT: 13 U/L (ref 0–44)
AST: 14 U/L — ABNORMAL LOW (ref 15–41)
Albumin: 4.2 g/dL (ref 3.5–5.0)
Alkaline Phosphatase: 79 U/L (ref 38–126)
Anion gap: 4 — ABNORMAL LOW (ref 5–15)
BUN: 12 mg/dL (ref 6–20)
CO2: 31 mmol/L (ref 22–32)
Calcium: 8.8 mg/dL — ABNORMAL LOW (ref 8.9–10.3)
Chloride: 106 mmol/L (ref 98–111)
Creatinine: 0.62 mg/dL (ref 0.44–1.00)
GFR, Estimated: >60 mL/min (ref >60)
Glucose, Bld: 118 mg/dL — ABNORMAL HIGH (ref 70–99)
Potassium: 4.1 mmol/L (ref 3.5–5.1)
Sodium: 141 mmol/L (ref 135–145)
Total Bilirubin: 0.4 mg/dL (ref 0.3–1.2)
Total Protein: 7.4 g/dL (ref 6.5–8.1)

## 2022-04-24 NOTE — Progress Notes (Signed)
[facility] CONSULT NOTE  Patient Care Team: Lin Landsman, MD as PCP - General (Family Medicine)  CHIEF COMPLAINTS/PURPOSE OF CONSULTATION:  Abnormal SPEP  ASSESSMENT & PLAN:    This is a pleasant 56 year old female patient with a past medical history significant for rheumatoid arthritis, asthma, iron deficiency anemia referred to hematology for evaluation of abnormal labs.  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   She is here for follow-up.  Since last visit no new health concerns. Physical examination unremarkable, no palpable lymphadenopathy or hepatosplenomegaly. 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  If MGUS labs remained stable, she can return to clinic in 6 months.  HISTORY OF PRESENTING ILLNESS:   Julie [name] 56 y.o. female is here because of abnormal SPEP.  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   Since we last saw her, she continues to complain of chronic cough from [place] She also reports chronic fatigue.  No B symptoms.  She lost weight intentionally, 14 pounds.  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  She has been reporting some dry eyes No new bone pains. She has some pain from fibromyalgia. Rest of the pertinent 10 point ROS reviewed and negative.  MEDICAL HISTORY:  Past Medical History:  Diagnosis Date   Anemia    Asthma    Complication of anesthesia    Eczema    Esophageal dysphagia    GERD (gastroesophageal reflux disease)    PONV  (postoperative nausea and vomiting)    Rheumatoid arthritis ([place])     SURGICAL HISTORY: Past Surgical History:  Procedure Laterality Date   ABDOMINAL HYSTERECTOMY     partial age 68   BILATERAL SALPINGECTOMY Bilateral 05/25/2015   Procedure: BILATERAL SALPINGECTOMY;  Surgeon: Servando Salina, MD;  Location: [facility];  Service: Gynecology;  Laterality: Bilateral;   CARPAL TUNNEL RELEASE Left 01/05/2020   Procedure: LEFT CARPAL TUNNEL RELEASE;  Surgeon: Daryll Brod, MD;  Location: [facility];  Service: Orthopedics;  Laterality: Left;  FOREARM BIER BLOCK   COLONOSCOPY     OVARIAN CYST REMOVAL Right 05/25/2015   Procedure: OVARIAN CYSTECTOMY;  Surgeon: Servando Salina, MD;  Location: [facility];  Service: Gynecology;  Laterality: Right;   TONSILLECTOMY     TUBAL LIGATION      SOCIAL HISTORY: Social History   Socioeconomic History   Marital status: Married    Spouse name: Not on file   Number of children: Not on file   Years of education: Not on file   Highest education level: Not on file  Occupational History   Not on file  Tobacco Use   Smoking status: Never   Smokeless tobacco: Never  Vaping Use   Vaping Use: Never used  Substance and Sexual Activity   Alcohol use: No   Drug use: No   Sexual activity: Not on file  Other Topics Concern   Not on file  Social History Narrative   Right handed    Caffeine- 2 cups per day    Social Determinants of Health  Financial Resource Strain: Not on file  Food Insecurity: Not on file  Transportation Needs: Not on file  Physical Activity: Not on file  Stress: Not on file  Social Connections: Not on file  Intimate Partner Violence: Not on file    FAMILY HISTORY: Family History  Problem Relation Age of Onset   Asthma Mother    Eczema Mother    Sarcoidosis Mother    Hypertension Mother    Cancer Father    Hypotension Father    Congestive Heart Failure Father    Colon cancer Father    Multiple myeloma Father     Asthma Sister    GER disease Sister    Lung cancer Maternal Grandfather        smoked   Allergic rhinitis Neg Hx    Angioedema Neg Hx    Immunodeficiency Neg Hx    Urticaria Neg Hx    Esophageal cancer Neg Hx    Rectal cancer Neg Hx    Stomach cancer Neg Hx     ALLERGIES:  is allergic to peanut-containing drug products, aspirin, and augmentin [amoxicillin-pot clavulanate].  MEDICATIONS:  Current Outpatient Medications  Medication Sig Dispense Refill   Abatacept (ORENCIA CLICKJECT) 242 MG/ML SOAJ INJECT 125MG SUBCUTANEOUSLY ONCE WEEKLY 4 mL 2   albuterol (PROVENTIL) (2.5 MG/3ML) 0.083% nebulizer solution Take 3 mLs (2.5 mg total) by nebulization every 4 (four) hours as needed for wheezing or shortness of breath. 75 mL 1   albuterol (VENTOLIN HFA) 108 (90 Base) MCG/ACT inhaler Inhale 2 puffs into the lungs every 6 (six) hours as needed for wheezing or shortness of breath. 18 g 1   azelastine (ASTELIN) 0.1 % nasal spray Place 1 spray into both nostrils 2 (two) times daily. Use in each nostril as directed 30 mL 5   B-D TB SYRINGE 1CC/27GX1/2" 27G X 1/2" 1 ML MISC Use to inject Methotrexate weekly. 12 each 3   budesonide (PULMICORT) 0.5 MG/2ML nebulizer solution Take 2 mLs (0.5 mg total) by nebulization 2 (two) times daily. (Patient taking differently: Take 0.5 mg by nebulization daily.) 2 mL 5   cetirizine (ZYRTEC) 10 MG tablet TAKE 1 TABLET EVERY DAY 30 tablet 5   diclofenac Sodium (VOLTAREN) 1 % GEL Apply 2g to 4g to affected area up to 4 times daily as needed. 400 g 4   DUPIXENT 300 MG/2ML prefilled syringe INJECT 300MG SUBCUTANEOUSLY EVERY OTHER WEEK 4 mL 11   EPINEPHrine 0.3 mg/0.3 mL IJ SOAJ injection Inject 0.3 mLs (0.3 mg total) into the muscle as needed for anaphylaxis. 2 each 2   escitalopram (LEXAPRO) 20 MG tablet Take 20 mg by mouth daily.  5   fluticasone (FLONASE) 50 MCG/ACT nasal spray Place 1 spray into both nostrils daily. 16 g 6   folic acid (FOLVITE) 1 MG tablet TAKE 1  TABLET BY MOUTH TWICE A DAY 60 tablet 11   hydroxychloroquine (PLAQUENIL) 200 MG tablet TAKE 1 TABLET TWICE A DAY MONDAY -FRIDAY 40 tablet 2   levocetirizine (XYZAL) 5 MG tablet Take 5 mg by mouth at bedtime.     Methotrexate, PF, (RASUVO) 15 MG/0.3ML SOAJ Inject 15 mg into the skin once a week. 3.6 mL 0   montelukast (SINGULAIR) 10 MG tablet TAKE 1 TABLET BY MOUTH EVERY DAY 30 tablet 4   Multiple Vitamins-Minerals (DAILY MULTIVITAMIN PO) Take 1 tablet by mouth daily.      pantoprazole (PROTONIX) 40 MG tablet Take 1 tablet (40 mg total) by mouth daily. 30 tablet  3   Current Facility-Administered Medications  Medication Dose Route Frequency Provider Last Rate Last Admin   Dupilumab SOSY 300 mg  300 mg Subcutaneous Q14 Days Dara Hoyer, FNP   300 mg at 04/11/22 1651    PHYSICAL EXAMINATION: ECOG PERFORMANCE STATUS: 0 - Asymptomatic  Vitals:   04/24/22 0917  BP: (!) 148/100  Pulse: 81  Resp: 18  Temp: 97.7 F (36.5 C)  SpO2: 100%   Filed Weights   04/24/22 0917  Weight: 187 lb 9.6 oz (85.1 kg)   Physical Exam Constitutional:      Appearance: Normal appearance.  HENT:     Head: Normocephalic and atraumatic.  Cardiovascular:     Rate and Rhythm: Normal rate and regular rhythm.  Pulmonary:     Effort: Pulmonary effort is normal.     Breath sounds: Normal breath sounds.  Abdominal:     General: Abdomen is flat.     Palpations: Abdomen is soft.  Musculoskeletal:        General: No swelling or tenderness.  Skin:    General: Skin is warm and dry.  Neurological:     General: No focal deficit present.     Mental Status: She is alert.  Psychiatric:        Mood and Affect: Mood normal.        Behavior: Behavior normal.      LABORATORY DATA:  I have reviewed the data as listed Lab Results  Component Value Date   WBC 3.3 (L) 04/24/2022   HGB 12.1 04/24/2022   HCT 35.4 (L) 04/24/2022   MCV 85.1 04/24/2022   PLT 214 04/24/2022     Chemistry      Component Value  Date/Time   NA 141 04/24/2022 0901   NA 140 10/02/2016 1602   K 4.1 04/24/2022 0901   CL 106 04/24/2022 0901   CO2 31 04/24/2022 0901   BUN 12 04/24/2022 0901   BUN 8 10/02/2016 1602   CREATININE 0.62 04/24/2022 0901   CREATININE 0.62 03/01/2022 0859      Component Value Date/Time   CALCIUM 8.8 (L) 04/24/2022 0901   ALKPHOS 79 04/24/2022 0901   AST 14 (L) 04/24/2022 0901   ALT 13 04/24/2022 0901   BILITOT 0.4 04/24/2022 0901     Labs from February 2023 with an M spike of 0.6 g/dL normal CMP kappa lambda ratio of 2.56.  No evidence of immunoparesis Most recent CBC from June with no anemia, mild leukopenia and neutropenia noted. CMP with no evidence of AKI  CBC from today reviewed, no concerns  RADIOGRAPHIC STUDIES: I have personally reviewed the radiological images as listed and agreed with the findings in the report. MM 3D SCREEN BREAST BILATERAL  Result Date: 04/19/2022 CLINICAL DATA:  Screening. EXAM: DIGITAL SCREENING BILATERAL MAMMOGRAM WITH TOMOSYNTHESIS AND CAD TECHNIQUE: Bilateral screening digital craniocaudal and mediolateral oblique mammograms were obtained. Bilateral screening digital breast tomosynthesis was performed. The images were evaluated with computer-aided detection. COMPARISON:  Previous exam(s). ACR Breast Density Category b: There are scattered areas of fibroglandular density. FINDINGS: There are no findings suspicious for malignancy. IMPRESSION: No mammographic evidence of malignancy. A result letter of this screening mammogram will be mailed directly to the patient. RECOMMENDATION: Screening mammogram in one year. (Code:SM-B-01Y) BI-RADS CATEGORY  1: Negative. Electronically Signed   By: Ammie Ferrier M.D.   On: 04/19/2022 12:46     I have reviewed pertinent labs.   I spent 20 minutes in the care of  this patient including history and physical, review of records, discussion about MGUS and surveillance recommendations.    Benay Pike, MD 04/24/2022 10:25  AM

## 2022-04-25 ENCOUNTER — Ambulatory Visit (INDEPENDENT_AMBULATORY_CARE_PROVIDER_SITE_OTHER): Payer: 59

## 2022-04-25 DIAGNOSIS — J4540 Moderate persistent asthma, uncomplicated: Secondary | ICD-10-CM | POA: Diagnosis not present

## 2022-04-25 LAB — KAPPA/LAMBDA LIGHT CHAINS
Kappa free light chain: 21.5 mg/L — ABNORMAL HIGH (ref 3.3–19.4)
Kappa, lambda light chain ratio: 2.29 — ABNORMAL HIGH (ref 0.26–1.65)
Lambda free light chains: 9.4 mg/L (ref 5.7–26.3)

## 2022-04-27 LAB — MULTIPLE MYELOMA PANEL, SERUM
Albumin SerPl Elph-Mcnc: 3.6 g/dL (ref 2.9–4.4)
Albumin/Glob SerPl: 1.2 (ref 0.7–1.7)
Alpha 1: 0.2 g/dL (ref 0.0–0.4)
Alpha2 Glob SerPl Elph-Mcnc: 0.7 g/dL (ref 0.4–1.0)
B-Globulin SerPl Elph-Mcnc: 1 g/dL (ref 0.7–1.3)
Gamma Glob SerPl Elph-Mcnc: 1.2 g/dL (ref 0.4–1.8)
Globulin, Total: 3.1 g/dL (ref 2.2–3.9)
IgA: 136 mg/dL (ref 87–352)
IgG (Immunoglobin G), Serum: 1143 mg/dL (ref 586–1602)
IgM (Immunoglobulin M), Srm: 100 mg/dL (ref 26–217)
M Protein SerPl Elph-Mcnc: 0.4 g/dL — ABNORMAL HIGH
Total Protein ELP: 6.7 g/dL (ref 6.0–8.5)

## 2022-05-02 ENCOUNTER — Telehealth: Payer: Self-pay | Admitting: *Deleted

## 2022-05-02 NOTE — Telephone Encounter (Addendum)
-----   Message from Benay Pike, MD sent at 04/30/2022  8:56 PM EDT ----- MGUS labs appear stable. If you can please let her know.  Thanks  This RN contacted pt and informed of above - questions answered. No further needs at this time.

## 2022-05-07 DIAGNOSIS — M0579 Rheumatoid arthritis with rheumatoid factor of multiple sites without organ or systems involvement: Secondary | ICD-10-CM

## 2022-05-08 ENCOUNTER — Other Ambulatory Visit: Payer: Self-pay | Admitting: Physician Assistant

## 2022-05-08 MED ORDER — ORENCIA CLICKJECT 125 MG/ML ~~LOC~~ SOAJ
SUBCUTANEOUS | 2 refills | Status: DC
Start: 2022-05-08 — End: 2022-08-02

## 2022-05-08 NOTE — Telephone Encounter (Signed)
Next Visit: 06/05/2022  Last Visit: 03/01/2022  Last Fill: 01/22/2022  DX: Rheumatoid arthritis involving multiple sites with positive rheumatoid factor   Current Dose per office note 03/01/2022: Orencia 125 mg sq injections once weekly  Labs: 04/24/2022 Glucose 118, Calcium 8.8, AST 14, Anion gap 4, WBC 3.3, Hct 35.4, Neutro Abs 1.5  TB Gold: 09/22/2021 Neg    Okay to refill Orencia?

## 2022-05-09 ENCOUNTER — Ambulatory Visit (INDEPENDENT_AMBULATORY_CARE_PROVIDER_SITE_OTHER): Payer: 59

## 2022-05-09 DIAGNOSIS — J4540 Moderate persistent asthma, uncomplicated: Secondary | ICD-10-CM | POA: Diagnosis not present

## 2022-05-12 ENCOUNTER — Other Ambulatory Visit: Payer: Self-pay | Admitting: Allergy & Immunology

## 2022-05-14 MED ORDER — PANTOPRAZOLE SODIUM 40 MG PO TBEC
40.0000 mg | DELAYED_RELEASE_TABLET | Freq: Every day | ORAL | 0 refills | Status: DC
Start: 2022-05-14 — End: 2022-06-13

## 2022-05-17 ENCOUNTER — Encounter: Payer: Self-pay | Admitting: Allergy & Immunology

## 2022-05-17 ENCOUNTER — Ambulatory Visit: Payer: 59 | Admitting: Allergy & Immunology

## 2022-05-17 DIAGNOSIS — J309 Allergic rhinitis, unspecified: Secondary | ICD-10-CM

## 2022-05-22 NOTE — Progress Notes (Unsigned)
Office Visit Note  Patient: Julie [name]             Date of Birth: 06-11-1966           MRN: [id number]             PCP: Lin Landsman, MD Referring: Lin Landsman, MD Visit Date: 06/05/2022 Occupation: @GUAROCC @  Subjective:  Sicca symptoms  History of Present Illness: Julie [name] is a 56 y.o. female with history of seropositive rheumatoid arthritis and fibromyalgia.  She remains on Orencia 125 mg sq injections once weekly, Rasuvo 15 mg sq injections once weekly, and Plaquenil 200 mg 1 tablet by mouth twice daily Monday through Friday.  She has been tolerating triple therapy without any side effects and has not missed any doses recently.  She has not noticed any increased joint pain or recent flares since having to reduce the dose of Rasuvo due to neutropenia in June 2023.  She has intermittent discomfort in both hands which is mostly due to underlying osteoarthritis.  Most of her discomfort has been in the right [facility] joint.  Patient reports that she continues to have massages 1-2 times per month and sees her chiropractor on a monthly basis to alleviate her discomfort from fibromyalgia. She states that her biggest concern has been increased sicca symptoms.  She has been using xiidra dry eyedrops prescribed by her ophthalmologist and using Biotene oral rinse for mouth dryness. No recent or recurrent infections.    Activities of Daily Living:  Patient reports morning stiffness for 0 minutes.   Patient Denies nocturnal pain.  Difficulty dressing/grooming: Denies Difficulty climbing stairs: Denies Difficulty getting out of chair: Denies Difficulty using hands for taps, buttons, cutlery, and/or writing: Denies  Review of Systems  Constitutional:  Negative for fatigue.  HENT:  Positive for mouth dryness. Negative for mouth sores.   Eyes:  Positive for dryness.  Respiratory:  Positive for shortness of breath.   Cardiovascular:  Negative for chest pain and palpitations.   Gastrointestinal:  Negative for blood in stool, constipation and diarrhea.  Endocrine: Negative for increased urination.  Genitourinary:  Negative for involuntary urination.  Musculoskeletal:  Positive for joint pain, joint pain and joint swelling. Negative for gait problem, myalgias, muscle weakness, morning stiffness, muscle tenderness and myalgias.  Skin:  Negative for color change, rash, hair loss and sensitivity to sunlight.  Allergic/Immunologic: Negative for susceptible to infections.  Neurological:  Positive for headaches. Negative for dizziness.  Hematological:  Negative for swollen glands.  Psychiatric/Behavioral:  Positive for sleep disturbance. Negative for depressed mood. The patient is not nervous/anxious.     PMFS History:  Patient Active Problem List   Diagnosis Date Noted   Witnessed episode of apnea 01/07/2022   Viral illness 09/08/2021   Anaphylactic shock due to adverse food reaction 02/01/2020   Moderate persistent asthma without complication 94/49/6759   Complication of anesthesia    Fibromyalgia 02/14/2018   History of gastroesophageal reflux (GERD) 02/14/2018   History of asthma 02/14/2018   Rheumatoid arthritis involving multiple sites with positive rheumatoid factor ([place]) 11/05/2016   High risk medication use 11/05/2016   LPRD (laryngopharyngeal reflux disease) 09/20/2015   Seasonal and perennial allergic rhinoconjunctivitis 09/20/2015   Acute sinusitis 07/29/2015   S/P total hysterectomy 05/25/2015    Past Medical History:  Diagnosis Date   Anemia    Asthma    Complication of anesthesia    Eczema    Esophageal dysphagia    GERD (gastroesophageal reflux  disease)    PONV (postoperative nausea and vomiting)    Rheumatoid arthritis ([place])     Family History  Problem Relation Age of Onset   Asthma Mother    Eczema Mother    Sarcoidosis Mother    Hypertension Mother    Cancer Father    Hypotension Father    Congestive Heart Failure Father     Colon cancer Father    Multiple myeloma Father    Asthma Sister    GER disease Sister    Lung cancer Maternal Grandfather        smoked   Allergic rhinitis Neg Hx    Angioedema Neg Hx    Immunodeficiency Neg Hx    Urticaria Neg Hx    Esophageal cancer Neg Hx    Rectal cancer Neg Hx    Stomach cancer Neg Hx    Past Surgical History:  Procedure Laterality Date   ABDOMINAL HYSTERECTOMY     partial age 41   BILATERAL SALPINGECTOMY Bilateral 05/25/2015   Procedure: BILATERAL SALPINGECTOMY;  Surgeon: Servando Salina, MD;  Location: [facility];  Service: Gynecology;  Laterality: Bilateral;   CARPAL TUNNEL RELEASE Left 01/05/2020   Procedure: LEFT CARPAL TUNNEL RELEASE;  Surgeon: Daryll Brod, MD;  Location: [facility];  Service: Orthopedics;  Laterality: Left;  FOREARM BIER BLOCK   COLONOSCOPY     OVARIAN CYST REMOVAL Right 05/25/2015   Procedure: OVARIAN CYSTECTOMY;  Surgeon: Servando Salina, MD;  Location: [facility];  Service: Gynecology;  Laterality: Right;   TONSILLECTOMY     TUBAL LIGATION     Social History   Social History Narrative   Right handed    Caffeine- 2 cups per day    Immunization History  Administered Date(s) Administered   Influenza Inj Mdck Quad Pf 10/19/2017   Influenza Split 11/25/2014   Influenza, Quadrivalent, Recombinant, Inj, Pf 08/26/2019   Influenza, Seasonal, Injecte, Preservative Fre 09/20/2015   Influenza,inj,Quad PF,6+ Mos 08/04/2018   Influenza-Unspecified 07/18/2017   PFIZER(Purple Top)SARS-COV-2 Vaccination 11/28/2019, 12/19/2019, 06/23/2020     Objective: Vital Signs: BP 124/84 (BP Location: Left Arm, Patient Position: Sitting, Cuff Size: Normal)   Pulse 75   Resp 17   Ht 5' 1.25" (1.556 m)   Wt 181 lb 3.2 oz (82.2 kg)   LMP 05/19/2015   BMI 33.96 kg/m    Physical Exam Vitals and nursing note reviewed.  Constitutional:      Appearance: She is well-developed.  HENT:     Head: Normocephalic and atraumatic.  Eyes:      Conjunctiva/sclera: Conjunctivae normal.  Cardiovascular:     Rate and Rhythm: Normal rate and regular rhythm.     Heart sounds: Normal heart sounds.  Pulmonary:     Effort: Pulmonary effort is normal.     Breath sounds: Normal breath sounds.  Abdominal:     General: Bowel sounds are normal.     Palpations: Abdomen is soft.  Musculoskeletal:     Cervical back: Normal range of motion.  Skin:    General: Skin is warm and dry.     Capillary Refill: Capillary refill takes less than 2 seconds.  Neurological:     Mental Status: She is alert and oriented to person, place, and time.  Psychiatric:        Behavior: Behavior normal.      Musculoskeletal Exam: C-spine, thoracic spine, lumbar spine have good range of motion.  Shoulder joints, elbow joints, and wrist joints have good range of motion  with no synovitis.  Some tenderness over the right CMC joint.  No tenderness or synovitis over MCP or PIP joints.  She was able to make a complete fist bilaterally.  Hip joints have good range of motion with no groin pain.  Knee joints have good range of motion with no warmth or effusion.  Ankle joints have good range of motion with no tenderness or joint swelling.  No tenderness over MTP joints.  CDAI Exam: CDAI Score: 0.4  Patient Global: 2 mm; Provider Global: 2 mm Swollen: 0 ; Tender: 0  Joint Exam 06/05/2022   No joint exam has been documented for this visit   There is currently no information documented on the homunculus. Go to the Rheumatology activity and complete the homunculus joint exam.  Investigation: No additional findings.  Imaging: No results found.  Recent Labs: Lab Results  Component Value Date   WBC 3.3 (L) 04/24/2022   HGB 12.1 04/24/2022   PLT 214 04/24/2022   NA 141 04/24/2022   K 4.1 04/24/2022   CL 106 04/24/2022   CO2 31 04/24/2022   GLUCOSE 118 (H) 04/24/2022   BUN 12 04/24/2022   CREATININE 0.62 04/24/2022   BILITOT 0.4 04/24/2022   ALKPHOS 79 04/24/2022    AST 14 (L) 04/24/2022   ALT 13 04/24/2022   PROT 7.4 04/24/2022   ALBUMIN 4.2 04/24/2022   CALCIUM 8.8 (L) 04/24/2022   GFRAA 118 03/16/2021   QFTBGOLDPLUS NEGATIVE 09/22/2021    Speciality Comments: PLQ Eye Exam: 12/08/2021  WNL at [facility] Follow up in 1 year  Procedures:  No procedures performed Allergies: Peanut-containing drug products, Aspirin, and Augmentin [amoxicillin-pot clavulanate]      Assessment / Plan:     Visit Diagnoses: Rheumatoid arthritis involving multiple sites with positive rheumatoid factor ([place]): She has no joint tenderness or synovitis on examination today.  She has not had any signs or symptoms of a rheumatoid arthritis flare.  She has clinically been doing well on triple therapy: Orencia 125 mg sq injections once weekly, Rasuvo 15 mg sq injections once weekly, Plaquenil 200 mg 1 tablet by mouth twice daily Monday through Friday.  The dose of Rasuvo was reduced in June 2023 due to neutropenia.  She has not noticed any new or worsening symptoms on the reduced dose of rasuvo.  Recommend updating x-rays of both hands and feet at her follow-up visit to assess for radiographic progression. She will remain on triple therapy as prescribed. A refill of plaquenil was sent to the pharmacy.  She was advised to notify us if she develops signs or symptoms of recurrent flares.  She will follow-up in 3 months or sooner if needed.  High risk medication use - Orencia 125 mg sq injections once weekly, Rasuvo 15 mg sq injections once weekly, and Plaquenil 200 mg 1 tablet by mouth twice daily Monday through Friday.  Reduced dose of Rasuvo due to neutropenia in June 2023.- Plan: CBC with Differential/Platelet, COMPLETE METABOLIC PANEL WITH GFR CBC and CMP updated on 04/24/22. Neutropenic-WBC 3.3, absolute neutrophils 1.5.  CBC and CMP will be updated today.  Her next lab work will be due in December and every 3 months to monitor for drug toxicity. TB gold negative on 09/22/21.  PLQ  Eye Exam: 12/08/2021 WNL at [facility] Follow up in 1 year.  No recent or recurrent infections. Discussed the importance of holding orencia and rasuvo if she develops signs or symptoms of an infection and to resume once the  infection has completely cleared.   Trigger middle finger of right hand: Resolved.  Fibromyalgia: She experiences intermittent myalgias and muscle tenderness due to fibromyalgia.  She has been getting massage 1-2 times per month and been seeing a chiropractor on a monthly basis.  Discussed the importance of regular exercise and good sleep hygiene.  Also discussed the importance of stress management.  Positive ANA (antinuclear antibody) - 07/13/20: ANA is positive-low, nonspecific titer. RNP remains positive. Complements WNL. dsDNA negative.   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   Sicca syndrome (HCC) - [place] and [place] antibodies negative.  She continues to have chronic sicca symptoms.  She has been using xiidra eyedrops prescribed by her ophthalmologist as well as Biotene oral rinse for mouth dryness.  Osteopenia of spine - DEXA 08/24/2019: AP spine BMD 0.909 with T score -1.3.  Due to update DEXA.  Future order for DEXA remains in place.  Other medical conditions are listed as follows:  History of Helicobacter pylori infection  History of gastroesophageal reflux (GERD)  History of asthma  History of depression  Abnormal SPEP  Orders: Orders Placed This Encounter  Procedures   CBC with Differential/Platelet   COMPLETE METABOLIC PANEL WITH GFR   Meds ordered this encounter  Medications   hydroxychloroquine (PLAQUENIL) 200 MG tablet    Sig: TAKE 1 TABLET TWICE A DAY MONDAY -FRIDAY    Dispense:  40 tablet    Refill:  2     Follow-Up Instructions: Return in about 3 months (around 09/04/2022) for Rheumatoid arthritis, Fibromyalgia.   Ofilia Neas, PA-C  Note - This record has been created using Dragon software.  Chart creation errors have been  sought, but may not always  have been located. Such creation errors do not reflect on  the standard of medical care.

## 2022-05-30 ENCOUNTER — Ambulatory Visit (INDEPENDENT_AMBULATORY_CARE_PROVIDER_SITE_OTHER): Payer: 59 | Admitting: *Deleted

## 2022-05-30 DIAGNOSIS — J4540 Moderate persistent asthma, uncomplicated: Secondary | ICD-10-CM

## 2022-05-31 ENCOUNTER — Other Ambulatory Visit: Payer: Self-pay | Admitting: Rheumatology

## 2022-05-31 MED ORDER — RASUVO 15 MG/0.3ML ~~LOC~~ SOAJ: 15.0000 mg | SUBCUTANEOUS | 0 refills | Status: DC

## 2022-05-31 NOTE — Telephone Encounter (Signed)
Next Visit: 06/05/2022  Last Visit: 03/01/2022  Last Fill: 03/02/2022  DX: Rheumatoid arthritis involving multiple sites with positive rheumatoid factor   Current Dose per lab note 03/01/2022: reduce rasuvo to 15 mg sq injections once weekly.   Labs: 04/24/2022 Glucose 118, Calcium 8.8, AST 14, Anion Gap 4, WBC 3.3, Hct 35.4, Neutro Abs 1.5  Okay to refill Rasuvo?

## 2022-05-31 NOTE — Telephone Encounter (Signed)
Patient states she called [facility] to request a refill of her Rasuvo and was told they sent a request to the office on 05/08/22 and it was declined.  Patient requested a return call.

## 2022-06-05 ENCOUNTER — Encounter: Payer: Self-pay | Admitting: Physician Assistant

## 2022-06-05 ENCOUNTER — Other Ambulatory Visit: Payer: Self-pay | Admitting: Physician Assistant

## 2022-06-05 ENCOUNTER — Ambulatory Visit: Payer: 59 | Attending: Physician Assistant | Admitting: Physician Assistant

## 2022-06-05 VITALS — BP 124/84 | HR 75 | Resp 17 | Ht 61.25 in | Wt 181.2 lb

## 2022-06-05 DIAGNOSIS — M0579 Rheumatoid arthritis with rheumatoid factor of multiple sites without organ or systems involvement: Secondary | ICD-10-CM | POA: Diagnosis not present

## 2022-06-05 DIAGNOSIS — R768 Other specified abnormal immunological findings in serum: Secondary | ICD-10-CM

## 2022-06-05 DIAGNOSIS — M3500 Sicca syndrome, unspecified: Secondary | ICD-10-CM

## 2022-06-05 DIAGNOSIS — Z8659 Personal history of other mental and behavioral disorders: Secondary | ICD-10-CM

## 2022-06-05 DIAGNOSIS — Z79899 Other long term (current) drug therapy: Secondary | ICD-10-CM

## 2022-06-05 DIAGNOSIS — M65331 Trigger finger, right middle finger: Secondary | ICD-10-CM | POA: Diagnosis not present

## 2022-06-05 DIAGNOSIS — Z8709 Personal history of other diseases of the respiratory system: Secondary | ICD-10-CM

## 2022-06-05 DIAGNOSIS — M797 Fibromyalgia: Secondary | ICD-10-CM | POA: Diagnosis not present

## 2022-06-05 DIAGNOSIS — Z8719 Personal history of other diseases of the digestive system: Secondary | ICD-10-CM

## 2022-06-05 DIAGNOSIS — Z8619 Personal history of other infectious and parasitic diseases: Secondary | ICD-10-CM

## 2022-06-05 DIAGNOSIS — R7689 Other specified abnormal immunological findings in serum: Secondary | ICD-10-CM

## 2022-06-05 DIAGNOSIS — M8588 Other specified disorders of bone density and structure, other site: Secondary | ICD-10-CM

## 2022-06-05 DIAGNOSIS — R778 Other specified abnormalities of plasma proteins: Secondary | ICD-10-CM

## 2022-06-05 MED ORDER — HYDROXYCHLOROQUINE SULFATE 200 MG PO TABS: ORAL_TABLET | ORAL | 2 refills | Status: DC

## 2022-06-05 NOTE — Patient Instructions (Addendum)
Standing Labs We placed an order today for your standing lab work.   Please have your standing labs drawn in December and every 3 months   If possible, please have your labs drawn 2 weeks prior to your appointment so that the provider can discuss your results at your appointment.  Please note that you may see your imaging and lab results in [place] before we have reviewed them. We may be awaiting multiple results to interpret others before contacting you. Please allow our office up to 72 hours to thoroughly review all of the results before contacting the office for clarification of your results.  We currently have open lab daily: Monday through Thursday from 1:30 PM-4:30 PM and Friday from 1:30 PM- 4:00 PM If possible, please come for your lab work on Monday, Thursday or Friday afternoons, as you may experience shorter wait times.   Effective July 18, 2022 the new lab hours will change to: Monday through Thursday from 1:30 PM-5:00 PM and Friday from 8:30 AM-12:00 PM If possible, please come for your lab work on Monday and Thursday afternoons, as you may experience shorter wait times.  Please be advised, all patients with office appointments requiring lab work will take precedent over walk-in lab work.    The office is located at [address], [place], [place], [place] [address] No appointment is necessary.   Labs are drawn by [facility]. Please bring your co-pay at the time of your lab draw.  You may receive a bill from [facility] for your lab work.  Please note if you are on Hydroxychloroquine and and an order has been placed for a Hydroxychloroquine level, you will need to have it drawn 4 hours or more after your last dose.  If you wish to have your labs drawn at another location, please call the office 24 hours in advance to send orders.  If you have any questions regarding directions or hours of operation,  please call [phone].   As a reminder, please drink plenty of water prior  to coming for your lab work. Thanks!  If you have signs or symptoms of an infection or start antibiotics: First, call your PCP for workup of your infection. Hold your medication through the infection, until you complete your antibiotics, and until symptoms resolve if you take the following: Injectable medication (Actemra, Benlysta, Cimzia, Cosentyx, Enbrel, Humira, Kevzara, Orencia, Remicade, Simponi, Stelara, Taltz, Tremfya) Methotrexate Leflunomide (Arava) Mycophenolate (Cellcept) Morrie Sheldon, Olumiant, or Rinvoq  Vaccines You are taking a medication(s) that can suppress your immune system.  The following immunizations are recommended: Flu annually Covid-19  Td/Tdap (tetanus, diphtheria, pertussis) every 10 years Pneumonia (Prevnar 15 then Pneumovax 23 at least 1 year apart.  Alternatively, can take Prevnar 20 without needing additional dose) Shingrix: 2 doses from 4 weeks to 6 months apart  Please check with your PCP to make sure you are up to date.

## 2022-06-06 LAB — CBC WITH DIFFERENTIAL/PLATELET
Absolute Monocytes: 277 cells/uL (ref 200–950)
Basophils Absolute: 31 cells/uL (ref 0–200)
Basophils Relative: 0.8 %
Eosinophils Absolute: 98 cells/uL (ref 15–500)
Eosinophils Relative: 2.5 %
HCT: 38.7 % (ref 35.0–45.0)
Hemoglobin: 12.7 g/dL (ref 11.7–15.5)
Lymphs Abs: 1498 cells/uL (ref 850–3900)
MCH: 28.6 pg (ref 27.0–33.0)
MCHC: 32.8 g/dL (ref 32.0–36.0)
MCV: 87.2 fL (ref 80.0–100.0)
MPV: 10.7 fL (ref 7.5–12.5)
Monocytes Relative: 7.1 %
Neutro Abs: 1997 cells/uL (ref 1500–7800)
Neutrophils Relative %: 51.2 %
Platelets: 227 10*3/uL (ref 140–400)
RBC: 4.44 10*6/uL (ref 3.80–5.10)
RDW: 12.1 % (ref 11.0–15.0)
Total Lymphocyte: 38.4 %
WBC: 3.9 10*3/uL (ref 3.8–10.8)

## 2022-06-06 LAB — COMPLETE METABOLIC PANEL WITH GFR
AG Ratio: 1.6 (calc) (ref 1.0–2.5)
ALT: 16 U/L (ref 6–29)
AST: 15 U/L (ref 10–35)
Albumin: 4.4 g/dL (ref 3.6–5.1)
Alkaline phosphatase (APISO): 88 U/L (ref 37–153)
BUN: 13 mg/dL (ref 7–25)
CO2: 29 mmol/L (ref 20–32)
Calcium: 9.8 mg/dL (ref 8.6–10.4)
Chloride: 106 mmol/L (ref 98–110)
Creat: 0.6 mg/dL (ref 0.50–1.03)
Globulin: 2.8 g/dL (calc) (ref 1.9–3.7)
Glucose, Bld: 95 mg/dL (ref 65–99)
Potassium: 4.8 mmol/L (ref 3.5–5.3)
Sodium: 142 mmol/L (ref 135–146)
Total Bilirubin: 0.4 mg/dL (ref 0.2–1.2)
Total Protein: 7.2 g/dL (ref 6.1–8.1)
eGFR: 107 mL/min/{1.73_m2} (ref >=60)

## 2022-06-06 NOTE — Progress Notes (Signed)
CBC and CMP WNL

## 2022-06-12 ENCOUNTER — Ambulatory Visit (INDEPENDENT_AMBULATORY_CARE_PROVIDER_SITE_OTHER): Payer: 59 | Admitting: Allergy & Immunology

## 2022-06-12 ENCOUNTER — Ambulatory Visit: Payer: 59

## 2022-06-12 ENCOUNTER — Encounter: Payer: Self-pay | Admitting: Allergy & Immunology

## 2022-06-12 VITALS — BP 108/70 | HR 85 | Temp 98.3°F | Resp 18 | Ht 61.25 in | Wt 181.8 lb

## 2022-06-12 DIAGNOSIS — J4540 Moderate persistent asthma, uncomplicated: Secondary | ICD-10-CM

## 2022-06-12 DIAGNOSIS — J383 Other diseases of vocal cords: Secondary | ICD-10-CM

## 2022-06-12 DIAGNOSIS — R490 Dysphonia: Secondary | ICD-10-CM

## 2022-06-12 DIAGNOSIS — K219 Gastro-esophageal reflux disease without esophagitis: Secondary | ICD-10-CM | POA: Diagnosis not present

## 2022-06-12 DIAGNOSIS — R5383 Other fatigue: Secondary | ICD-10-CM

## 2022-06-12 DIAGNOSIS — J302 Other seasonal allergic rhinitis: Secondary | ICD-10-CM

## 2022-06-12 DIAGNOSIS — J3089 Other allergic rhinitis: Secondary | ICD-10-CM

## 2022-06-12 MED ORDER — ALBUTEROL SULFATE HFA 108 (90 BASE) MCG/ACT IN AERS
2.0000 | INHALATION_SPRAY | Freq: Four times a day (QID) | RESPIRATORY_TRACT | 1 refills | Status: DC | PRN
Start: 2022-06-12 — End: 2023-10-08

## 2022-06-12 MED ORDER — MONTELUKAST SODIUM 10 MG PO TABS: 10.0000 mg | ORAL_TABLET | Freq: Every evening | ORAL | 5 refills | Status: DC

## 2022-06-12 MED ORDER — MUPIROCIN 2 % EX OINT: 1.0000 | TOPICAL_OINTMENT | Freq: Two times a day (BID) | CUTANEOUS | 3 refills | Status: DC

## 2022-06-12 MED ORDER — FLUTICASONE PROPIONATE 50 MCG/ACT NA SUSP: 1.0000 | Freq: Every day | NASAL | 6 refills | Status: DC

## 2022-06-12 MED ORDER — EPINEPHRINE 0.3 MG/0.3ML IJ SOAJ: 0.3000 mg | INTRAMUSCULAR | 2 refills | Status: DC | PRN

## 2022-06-12 MED ORDER — AZELASTINE HCL 0.1 % NA SOLN: 1.0000 | Freq: Two times a day (BID) | NASAL | 5 refills | Status: DC

## 2022-06-12 MED ORDER — ALBUTEROL SULFATE (2.5 MG/3ML) 0.083% IN NEBU
2.5000 mg | INHALATION_SOLUTION | RESPIRATORY_TRACT | 2 refills | Status: DC | PRN
Start: 2022-06-12 — End: 2023-10-08

## 2022-06-12 MED ORDER — CETIRIZINE HCL 10 MG PO TABS
10.0000 mg | ORAL_TABLET | Freq: Every morning | ORAL | 5 refills | Status: DC
Start: 2022-06-12 — End: 2023-05-10

## 2022-06-12 MED ORDER — BUDESONIDE 0.5 MG/2ML IN SUSP
0.5000 mg | Freq: Two times a day (BID) | RESPIRATORY_TRACT | 2 refills | Status: DC
Start: 2022-06-12 — End: 2022-10-02

## 2022-06-12 MED ORDER — OMEPRAZOLE 40 MG PO CPDR: 40.0000 mg | DELAYED_RELEASE_CAPSULE | Freq: Two times a day (BID) | ORAL | 5 refills | Status: DC

## 2022-06-12 MED ORDER — LEVOCETIRIZINE DIHYDROCHLORIDE 5 MG PO TABS: 5.0000 mg | ORAL_TABLET | Freq: Every evening | ORAL | 5 refills | Status: DC

## 2022-06-12 NOTE — Progress Notes (Unsigned)
Synopsis: Referred for chronic cough by Lin Landsman, MD  Subjective:   PATIENT ID: Julie [name] GENDER: female DOB: Mar 17, 1966, MRN: [id number]  No chief complaint on file.  55yF with history of OSA (split night PSG 01/07/22 with severe OSA AHI 35.9, 6 min with o2 sat below 88%), asthma, eczema, seropositive RA on orencia/rasuvo/plaquenil, severe GERD and esophageal stricture s/p dilation 2022, h pylori infection, covid-19 infection 08/2021 severe but not hospitalized referred for cough. Has been vaccinated for covid-19.   She has had cough since December when she had covid-19. Cough is dry. She has no significant DOE. She uses pantoprazole for severe GERD. She is using albuterol and pulmicort nebs once every evening, still on dupixent. She has sinus congestion, maybe a trickle of postnasal drainage.   Mother with sarcoid, asthma  Never smoker. No vaping, MJ. She is a Copy. She has never lived outside of [place]  Interval HPI: Seen by Dr. 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  Otherwise pertinent review of systems is negative.  Past Medical History:  Diagnosis Date   Anemia    Asthma    Complication of anesthesia    Eczema    Esophageal dysphagia    GERD (gastroesophageal reflux disease)    PONV (postoperative nausea and vomiting)    Rheumatoid arthritis ([place])      Family History  Problem Relation Age of Onset   Asthma Mother    Eczema Mother    Sarcoidosis Mother    Hypertension Mother    Cancer Father    Hypotension Father    Congestive Heart Failure Father    Colon cancer Father    Multiple myeloma Father    Asthma Sister    GER disease Sister    Lung cancer Maternal Grandfather        smoked   Allergic rhinitis Neg Hx    Angioedema Neg Hx    Immunodeficiency Neg Hx    Urticaria Neg Hx     Esophageal cancer Neg Hx    Rectal cancer Neg Hx    Stomach cancer Neg Hx      Past Surgical History:  Procedure Laterality Date   ABDOMINAL HYSTERECTOMY     partial age 56   BILATERAL SALPINGECTOMY Bilateral 05/25/2015   Procedure: BILATERAL SALPINGECTOMY;  Surgeon: Servando Salina, MD;  Location: [facility];  Service: Gynecology;  Laterality: Bilateral;   CARPAL TUNNEL RELEASE Left 01/05/2020   Procedure: LEFT CARPAL TUNNEL RELEASE;  Surgeon: Daryll Brod, MD;  Location: [facility];  Service: Orthopedics;  Laterality: Left;  FOREARM BIER BLOCK   COLONOSCOPY     OVARIAN CYST REMOVAL Right 05/25/2015   Procedure: OVARIAN CYSTECTOMY;  Surgeon: Servando Salina, MD;  Location: [facility];  Service: Gynecology;  Laterality: Right;   TONSILLECTOMY     TUBAL LIGATION      Social History   Socioeconomic History   Marital status: Married    Spouse name: Not on file   Number of children: Not on file   Years of education: Not on file   Highest education level: Not on file  Occupational History   Not on file  Tobacco Use   Smoking status: Never    Passive exposure: Never   Smokeless tobacco: Never  Vaping Use   Vaping Use: Never used  Substance and Sexual Activity   Alcohol use: No  Drug use: No   Sexual activity: Not on file  Other Topics Concern   Not on file  Social History Narrative   Right handed    Caffeine- 2 cups per day    Social Determinants of Health   Financial Resource Strain: Not on file  Food Insecurity: Not on file  Transportation Needs: Not on file  Physical Activity: Not on file  Stress: Not on file  Social Connections: Not on file  Intimate Partner Violence: Not on file     Allergies  Allergen Reactions   Peanut-Containing Drug Products    Aspirin Other (See Comments)    pt couldn't take while on methotrexate, but is no longer taking   Augmentin [Amoxicillin-Pot Clavulanate] Other (See Comments)    G I upset     Outpatient Medications  Prior to Visit  Medication Sig Dispense Refill   Abatacept (ORENCIA CLICKJECT) 809 MG/ML SOAJ INJECT 125MG SUBCUTANEOUSLY ONCE WEEKLY 4 mL 2   albuterol (PROVENTIL) (2.5 MG/3ML) 0.083% nebulizer solution Take 3 mLs (2.5 mg total) by nebulization every 4 (four) hours as needed for wheezing or shortness of breath. 75 mL 1   albuterol (VENTOLIN HFA) 108 (90 Base) MCG/ACT inhaler Inhale 2 puffs into the lungs every 6 (six) hours as needed for wheezing or shortness of breath. 18 g 1   azelastine (ASTELIN) 0.1 % nasal spray Place 1 spray into both nostrils 2 (two) times daily. Use in each nostril as directed 30 mL 5   B-D TB SYRINGE 1CC/27GX1/2" 27G X 1/2" 1 ML MISC Use to inject Methotrexate weekly. (Patient not taking: Reported on 06/05/2022) 12 each 3   budesonide (PULMICORT) 0.5 MG/2ML nebulizer solution Take 2 mLs (0.5 mg total) by nebulization 2 (two) times daily. (Patient taking differently: Take 0.5 mg by nebulization daily.) 2 mL 5   cetirizine (ZYRTEC) 10 MG tablet TAKE 1 TABLET EVERY DAY 30 tablet 5   diclofenac Sodium (VOLTAREN) 1 % GEL Apply 2g to 4g to affected area up to 4 times daily as needed. 400 g 4   DUPIXENT 300 MG/2ML prefilled syringe INJECT 300MG SUBCUTANEOUSLY EVERY OTHER WEEK 4 mL 11   EPINEPHrine 0.3 mg/0.3 mL IJ SOAJ injection Inject 0.3 mLs (0.3 mg total) into the muscle as needed for anaphylaxis. 2 each 2   escitalopram (LEXAPRO) 20 MG tablet Take 20 mg by mouth daily. (Patient not taking: Reported on 06/05/2022)  5   fluticasone (FLONASE) 50 MCG/ACT nasal spray Place 1 spray into both nostrils daily. 16 g 6   folic acid (FOLVITE) 1 MG tablet TAKE 1 TABLET BY MOUTH TWICE A DAY 60 tablet 11   hydroxychloroquine (PLAQUENIL) 200 MG tablet TAKE 1 TABLET TWICE A DAY MONDAY -FRIDAY 40 tablet 2   levocetirizine (XYZAL) 5 MG tablet Take 5 mg by mouth at bedtime.     Methotrexate, PF, (RASUVO) 15 MG/0.3ML SOAJ Inject 15 mg into the skin once a week. 3.6 mL 0   montelukast (SINGULAIR)  10 MG tablet TAKE 1 TABLET BY MOUTH EVERY DAY 30 tablet 4   Multiple Vitamins-Minerals (DAILY MULTIVITAMIN PO) Take 1 tablet by mouth daily.      pantoprazole (PROTONIX) 40 MG tablet Take 1 tablet (40 mg total) by mouth daily. 30 tablet 0   Facility-Administered Medications Prior to Visit  Medication Dose Route Frequency Provider Last Rate Last Admin   Dupilumab SOSY 300 mg  300 mg Subcutaneous Q14 Days Dara Hoyer, FNP   300 mg at 05/30/22 1046  Objective:   Physical Exam:  General appearance: 56 y.o., female, NAD, conversant  Eyes: anicteric sclerae; PERRL, tracking appropriately HENT: NCAT; MMM Neck: Trachea midline; no lymphadenopathy, no JVD Lungs: CTAB, no crackles, no wheeze, with normal respiratory effort CV: RRR, no murmur  Abdomen: Soft, non-tender; non-distended, BS present  Extremities: No peripheral edema, warm Skin: Normal turgor and texture; no rash Psych: Appropriate affect Neuro: Alert and oriented to person and place, no focal deficit     There were no vitals filed for this visit.    on RA BMI Readings from Last 3 Encounters:  06/05/22 33.96 kg/m  04/24/22 36.64 kg/m  03/02/22 35.23 kg/m   Wt Readings from Last 3 Encounters:  06/05/22 181 lb 3.2 oz (82.2 kg)  04/24/22 187 lb 9.6 oz (85.1 kg)  03/02/22 188 lb (85.3 kg)     CBC    Component Value Date/Time   WBC 3.9 06/05/2022 0914   RBC 4.44 06/05/2022 0914   HGB 12.7 06/05/2022 0914   HGB 12.1 04/24/2022 0901   HGB 13.4 10/02/2016 1602   HCT 38.7 06/05/2022 0914   HCT 40.0 10/02/2016 1602   PLT 227 06/05/2022 0914   PLT 214 04/24/2022 0901   PLT 266 10/02/2016 1602   MCV 87.2 06/05/2022 0914   MCV 85 10/02/2016 1602   MCH 28.6 06/05/2022 0914   MCHC 32.8 06/05/2022 0914   RDW 12.1 06/05/2022 0914   RDW 12.5 10/02/2016 1602   LYMPHSABS 1,498 06/05/2022 0914   LYMPHSABS 1.5 10/02/2016 1602   MONOABS 0.2 04/24/2022 0901   EOSABS 98 06/05/2022 0914   EOSABS 0.1 10/02/2016 1602    BASOSABS 31 06/05/2022 0914   BASOSABS 0.0 10/02/2016 1602    Chest Imaging: CXR 01/31/22 reviewed by me unremarkable  Split night PSG 01/08/22: severe OSA AHI 35.9, 6 min with o2 sat below 88%  Pulmonary Functions Testing Results:     No data to display              Assessment & Plan:   # chronic cough Most likely culprit seems like severe gerd/LPR worsened by untreated OSA. Postnasal drainage may also contribute. But alternatively consider MTX related lung injury or RA related ILD (though CXR normal), worse asthma control.  # Severe OSA   # moderate persistent asthma, eosinophilic asthma  Plan: - we will order CPAP 12 cm H2O with full face mask for you - which I'm hopeful will help your insomnia, severe reflux, cough - take shower, clear nose of crusting then start using flonase 1 spray each nostril  - continue pulmicort/albuterol once daily - continue pantoprazole 40 mg daily - continue xyzal 5 mg daily - continue singulair 10 mg daily - dupixent per allergy - breathing tests next visit in 3 months     Maryjane Hurter, MD [facility] 06/12/2022 1:51 PM

## 2022-06-12 NOTE — Progress Notes (Signed)
FOLLOW UP  Date of Service/Encounter:  06/12/22   Assessment:   Chronic rhinosinusitis    Moderate persistent asthma - with worsening symptoms since contracting COVID19 in the last month   Rheumatoid arthritis - on MTX, folic acid, and Plaquenil   Difficulty using the Dupixent autoinjector due to coexisting RA (receives injections in our office)   History of migraines   Xerostomia - with positive ENA RNP Ab   Long COVID syndrome  Monoclonal gammopathy of unknown significance - followed by Dr. Chryl Heck  Plan/Recommendations:    There are no Patient Instructions on file for this visit.   Subjective:   Julie [name] is a 56 y.o. female presenting today for follow up of  Chief Complaint  Patient presents with  . Asthma    Says she is ok.    Julie [name] has a history of the following: Patient Active Problem List   Diagnosis Date Noted  . Witnessed episode of apnea 01/07/2022  . Viral illness 09/08/2021  . Anaphylactic shock due to adverse food reaction 02/01/2020  . Moderate persistent asthma without complication 85/88/5027  . Complication of anesthesia   . Fibromyalgia 02/14/2018  . History of gastroesophageal reflux (GERD) 02/14/2018  . History of asthma 02/14/2018  . Rheumatoid arthritis involving multiple sites with positive rheumatoid factor ([place]) 11/05/2016  . High risk medication use 11/05/2016  . LPRD (laryngopharyngeal reflux disease) 09/20/2015  . Seasonal and perennial allergic rhinoconjunctivitis 09/20/2015  . Acute sinusitis 07/29/2015  . S/P total hysterectomy 05/25/2015    History obtained from: chart review and {Persons; PED relatives w/patient:19415::"patient"}.  Julie is a 56 y.o. female presenting for {Blank single:19197::"a food challenge","a drug challenge","skin testing","a sick visit","an evaluation of ***","a follow up visit"}.  She was last seen in May 2023.  At that time, lung testing looked fairly good.  We referred her to  see Dr. Rowe Clack for evaluation of vocal cord dysfunction.  We also referred her to see pulmonology here in [place].  For controller medication, we continue with Pulmicort and albuterol 1-2 times daily via neb as well as Dupixent 300 mg every 2 weeks.  For her reflux, we continue with omeprazole in the morning and pantoprazole at night.  For her allergic rhinitis, we continue with alternating antihistamines as well as Astelin and Ayr nasal gel as needed.  She continue to avoid peanuts and tree nuts.  In the interim, she did see Dr. Leslye Peer from pulmonary medicine.  He recommended ordering a CPAP to help with insomnia and reflux as well as cough.  She was continued on Pulmicort and albuterol daily as well as pantoprazole, Xyzal, and Singulair.  He felt that a lot of her cough was related to severe GERD or reflux worsened by untreated sleep apnea.  She also saw Dr. Rowe Clack around a week ago, where she was diagnosed with muscle tension dysphonia.  At that time, she was started on fluconazole for 2 weeks to see if this helped at all.  She was referred for cough suppression therapy as well as voice therapy. This is going to be arranged for televisits, but was not sure that this   Since the last visit, she has done well.   Asthma/Respiratory Symptom History: She has been on the Diflucan for a few days. She has been taking it regularly.  She does feel that it is not as hard to talk with the Diflucan on board. She did feel a lot of pulling throughout her neck from using  the outside muscles to talk (because her throat was so sore and raw). She does feel that the CPAP is causing her to have more dry throat and dry mouth. She does feel that she is less tired and her headaches are improved.   {Blank single:19197::"Allergic Rhinitis Symptom History: ***"," "}  {Blank single:19197::"Food Allergy Symptom History: ***"," "}  Skin Symptom History: ***  GERD Symptom History: She does not think that the  Protonix is doing well.  She thinks that the omeprazole is doing better. She has been drinking as much water   She is now on Orencia for her rheumatoid arthritis.  Otherwise, there have been no changes to her past medical history, surgical history, family history, or social history.    ROS     Objective:   Blood pressure 108/70, pulse 85, temperature 98.3 F (36.8 C), temperature source Temporal, resp. rate 18, height 5' 1.25" (1.556 m), weight 181 lb 12.8 oz (82.5 kg), last menstrual period 05/19/2015, SpO2 99 %. Body mass index is 34.07 kg/m.    Physical Exam   Diagnostic studies:    Spirometry: results normal (FEV1: 1.54/74%, FVC: 1.96/76%, FEV1/FVC: 79%).    Spirometry consistent with normal pattern.   Allergy Studies: {Blank single:19197::"none","labs sent instead"," "}    {Blank single:19197::"Allergy testing results were read and interpreted by myself, documented by clinical staff."," "}      Salvatore Marvel, MD  Allergy and [place] [facility]

## 2022-06-12 NOTE — Patient Instructions (Addendum)
1. Moderate persistent asthma - complicated with recent KCLEX51 infection - Lung testing looks good today. - Continue to follow up Dr. Rowe Clack.  - Continue to follow with Dr. Verlee Monte.  - We are not going to make any medication changes.  - Try stopping the Pulmicort (budesonide) since this is a steroid and might be making your Candida yeast infection worse.  - Daily controller medication(s): albuterol neb solution 1-2 TIMES DAILY via nebulizer and Dupixent '300mg'$  every 2 weeks  - Prior to physical activity: albuterol 2 puffs 10-15 minutes before physical activity - Rescue medications: albuterol 4 puffs every 4-6 hours as needed or albuterol nebulizer one vial puffs every 4-6 hours as needed  - Asthma control goals:  * Full participation in all desired activities (may need albuterol before activity) * Albuterol use two time or less a week on average (not counting use with activity) * Cough interfering with sleep two time or less a month * Oral steroids no more than once a year * No hospitalizations  2. LPRD (laryngopharyngeal reflux disease) - Continue with omeprazole but increase to TWICE DAILY.  - Stop the pantoprazole..   3. Seasonal allergic rhinitis - Continue with alternating antihistamines daily (change every 2-3 months). - Continue with Astelin one spray per nostril up to twice daily as needed.  - Continue with Ayr nasal saline gel.  - You can also use Bactroban ointment twice daily as needed.   4. Food allergies - Continue to avoid peanuts and tree nuts.  5. Return in about 3 months (around 09/11/2022).    Please inform us of any Emergency Department visits, hospitalizations, or changes in symptoms. Call us before going to the ED for breathing or allergy symptoms since we might be able to fit you in for a sick visit. Feel free to contact us anytime with any questions, problems, or concerns.  It was a pleasure to see you again today!  Websites that have reliable patient  information: 1. American Academy of Asthma, Allergy, and Immunology: www.aaaai.org 2. Food Allergy Research and Education (FARE): foodallergy.org 3. Mothers of Asthmatics: http://www.asthmacommunitynetwork.org 4. American College of Allergy, Asthma, and Immunology: www.acaai.org   COVID-19 Vaccine Information can be found at: ShippingScam.co.uk For questions related to vaccine distribution or appointments, please email [email or web address] or call [phone].   We realize that you might be concerned about having an allergic reaction to the COVID19 vaccines. To help with that concern, WE ARE OFFERING THE COVID19 VACCINES IN OUR OFFICE! Ask the front desk for dates!     "Like" [place] on Facebook and Instagram for our latest updates!      A healthy democracy works best when [employer] participate! Make sure you are registered to vote! If you have moved or changed any of your contact information, you will need to get this updated before voting!  In some cases, you MAY be able to register to vote online: [email or web address]

## 2022-06-13 ENCOUNTER — Ambulatory Visit (INDEPENDENT_AMBULATORY_CARE_PROVIDER_SITE_OTHER): Payer: 59 | Admitting: Student

## 2022-06-13 ENCOUNTER — Encounter: Payer: Self-pay | Admitting: Allergy & Immunology

## 2022-06-13 ENCOUNTER — Encounter: Payer: Self-pay | Admitting: Student

## 2022-06-13 VITALS — BP 124/82 | HR 82 | Temp 98.2°F | Ht 61.4 in | Wt 183.0 lb

## 2022-06-13 DIAGNOSIS — R053 Chronic cough: Secondary | ICD-10-CM

## 2022-06-13 DIAGNOSIS — G4733 Obstructive sleep apnea (adult) (pediatric): Secondary | ICD-10-CM | POA: Diagnosis not present

## 2022-06-13 DIAGNOSIS — Z9989 Dependence on other enabling machines and devices: Secondary | ICD-10-CM | POA: Diagnosis not present

## 2022-06-13 LAB — PULMONARY FUNCTION TEST
DL/VA % pred: 143 %
DL/VA: 6.25 ml/min/mmHg/L
DLCO cor % pred: 107 %
DLCO cor: 20.47 ml/min/mmHg
DLCO unc % pred: 105 %
DLCO unc: 20.01 ml/min/mmHg
FEF 25-75 Post: 2.42 L/sec
FEF 25-75 Pre: 2.32 L/sec
FEF2575-%Change-Post: 4 %
FEF2575-%Pred-Post: 96 %
FEF2575-%Pred-Pre: 92 %
FEV1-%Change-Post: 0 %
FEV1-%Pred-Post: 71 %
FEV1-%Pred-Pre: 71 %
FEV1-Post: 1.77 L
FEV1-Pre: 1.78 L
FEV1FVC-%Change-Post: 4 %
FEV1FVC-%Pred-Pre: 107 %
FEV6-%Change-Post: -4 %
FEV6-%Pred-Post: 64 %
FEV6-%Pred-Pre: 67 %
FEV6-Post: 1.99 L
FEV6-Pre: 2.08 L
FEV6FVC-%Pred-Post: 103 %
FEV6FVC-%Pred-Pre: 103 %
FVC-%Change-Post: -4 %
FVC-%Pred-Post: 62 %
FVC-%Pred-Pre: 65 %
FVC-Post: 1.99 L
FVC-Pre: 2.08 L
Post FEV1/FVC ratio: 89 %
Post FEV6/FVC ratio: 100 %
Pre FEV1/FVC ratio: 86 %
Pre FEV6/FVC Ratio: 100 %
RV % pred: 115 %
RV: 1.98 L
TLC % pred: 87 %
TLC: 4.09 L

## 2022-06-13 NOTE — Patient Instructions (Signed)
Full PFT performed today. °

## 2022-06-13 NOTE — Patient Instructions (Signed)
-   keep up the good work with CPAP - agree with continuing omeprazole 40 mg twice daily before meals, singulair 10 mg daily, xyzal 5 mg daily - would lay off of pulmicort nebs due to thrush - take shower, clear nose of crusting then flonase 1 spray each nostril  - see you in 3 months or sooner to follow up on your progress with CPAP

## 2022-06-13 NOTE — Progress Notes (Signed)
Full PFT performed today. °

## 2022-06-27 ENCOUNTER — Ambulatory Visit (INDEPENDENT_AMBULATORY_CARE_PROVIDER_SITE_OTHER): Payer: 59

## 2022-06-27 DIAGNOSIS — J4540 Moderate persistent asthma, uncomplicated: Secondary | ICD-10-CM

## 2022-07-03 ENCOUNTER — Encounter: Payer: Self-pay | Admitting: Allergy & Immunology

## 2022-07-12 ENCOUNTER — Ambulatory Visit (INDEPENDENT_AMBULATORY_CARE_PROVIDER_SITE_OTHER): Payer: 59

## 2022-07-12 DIAGNOSIS — J4540 Moderate persistent asthma, uncomplicated: Secondary | ICD-10-CM

## 2022-07-24 MED ORDER — PREDNISONE 5 MG PO TABS: ORAL_TABLET | ORAL | 0 refills | Status: DC

## 2022-07-24 NOTE — Telephone Encounter (Signed)
Okay to send in prednisone 20 mg tapering by 5 mg every 4 days.  Take prednisone in the morning with food and avoid the use of NSAIDs.

## 2022-07-25 ENCOUNTER — Ambulatory Visit: Payer: 59

## 2022-08-01 ENCOUNTER — Other Ambulatory Visit: Payer: Self-pay | Admitting: Rheumatology

## 2022-08-02 ENCOUNTER — Other Ambulatory Visit: Payer: Self-pay | Admitting: Physician Assistant

## 2022-08-02 DIAGNOSIS — M0579 Rheumatoid arthritis with rheumatoid factor of multiple sites without organ or systems involvement: Secondary | ICD-10-CM

## 2022-08-02 NOTE — Telephone Encounter (Signed)
Next Visit: 09/05/2022  Last Visit: 06/05/2022  Last Fill: 05/08/2022  NU:[id number] arthritis involving multiple sites with positive rheumatoid factor   Current Dose per office note 06/05/2022: Orencia 125 mg sq injections once weekly   Labs: 06/05/2022 CBC and CMP WNL   TB Gold: 09/22/2021 Neg    Okay to refill Orencia?

## 2022-08-03 ENCOUNTER — Ambulatory Visit (INDEPENDENT_AMBULATORY_CARE_PROVIDER_SITE_OTHER): Payer: 59

## 2022-08-03 DIAGNOSIS — J4540 Moderate persistent asthma, uncomplicated: Secondary | ICD-10-CM | POA: Diagnosis not present

## 2022-08-14 MED ORDER — RASUVO 15 MG/0.3ML ~~LOC~~ SOAJ: 15.0000 mg | SUBCUTANEOUS | 0 refills | Status: DC

## 2022-08-14 NOTE — Addendum Note (Signed)
Addended by: Carole Binning on: 08/14/2022 12:07 PM   Modules accepted: Orders

## 2022-08-14 NOTE — Telephone Encounter (Signed)
Next Visit: 09/05/2022  Last Visit: 06/05/2022  Last Fill: 05/31/2022  DX:  Rheumatoid arthritis involving multiple sites with positive rheumatoid factor   Current Dose per office note 06/05/2022: Rasuvo 15 mg sq injections once weekly   Labs: 06/05/2022 CBC and CMP WNL   Okay to refill Rasuvo?

## 2022-08-15 ENCOUNTER — Ambulatory Visit (INDEPENDENT_AMBULATORY_CARE_PROVIDER_SITE_OTHER): Payer: 59

## 2022-08-15 DIAGNOSIS — J4540 Moderate persistent asthma, uncomplicated: Secondary | ICD-10-CM

## 2022-08-16 NOTE — Telephone Encounter (Signed)
Patient's copay card for Rasuvo reloads funds automatically with each calendar year. Nevertheless attempted ot re-enroll and response is that patient is already enrolled in program  Copay card company: [employer] phone: [phone]  Rasuvo copay card per chart review: BIN: [id number] PCN: [id number] GRP: [id number] ID: [id number]   Confirmed with pharmacy rep that they have been using the copay card per fill  Knox Saliva, PharmD, MPH, BCPS, CPP Clinical Pharmacist (Rheumatology and Pulmonology)

## 2022-08-23 NOTE — Progress Notes (Unsigned)
Office Visit Note  Patient: Julie [name]             Date of Birth: October 23, 1965           MRN: [id number]             PCP: Lin Landsman, MD Referring: Lin Landsman, MD Visit Date: 09/05/2022 Occupation: _0 @  Subjective:  Medication monitoring   History of Present Illness: Julie [name] is a 56 y.o. female with history of seropositive rheumatoid arthritis and fibromyalgia.  Patient remains on Orencia 125 mg sq injections once weekly, Rasuvo 15 mg sq injections once weekly, and Plaquenil 200 mg 1 tablet by mouth twice daily Monday through Friday. She is tolerating these medications without any side effects.  She denies any signs or symptoms of a flare.  Patient reports that she took a prednisone taper on 07/24/2022 for a flare in her hands which resolved her symptoms.  She has not had any recurrence.  She denies any soreness or joint swelling at this time.  Her energy level has improved since getting monthly vitamin infusions.      Activities of Daily Living:  Patient reports morning stiffness for 0 minutes.   Patient Denies nocturnal pain.  Difficulty dressing/grooming: Denies Difficulty climbing stairs: Denies Difficulty getting out of chair: Denies Difficulty using hands for taps, buttons, cutlery, and/or writing: Denies  Review of Systems  Constitutional:  Negative for fatigue.  HENT:  Positive for mouth dryness. Negative for mouth sores.   Eyes:  Positive for dryness.  Respiratory:  Negative for shortness of breath.   Cardiovascular:  Negative for chest pain and palpitations.  Gastrointestinal:  Negative for blood in stool, constipation and diarrhea.  Endocrine: Negative for increased urination.  Genitourinary:  Negative for involuntary urination.  Musculoskeletal:  Positive for joint pain and joint pain. Negative for gait problem, joint swelling, myalgias, muscle weakness, morning stiffness, muscle tenderness and myalgias.  Skin:  Negative for color change,  rash, hair loss and sensitivity to sunlight.  Allergic/Immunologic: Negative for susceptible to infections.  Neurological:  Positive for headaches. Negative for dizziness.  Hematological:  Negative for swollen glands.  Psychiatric/Behavioral:  Negative for depressed mood and sleep disturbance. The patient is not nervous/anxious.     PMFS History:  Patient Active Problem List   Diagnosis Date Noted   Witnessed episode of apnea 01/07/2022   Viral illness 09/08/2021   Anaphylactic shock due to adverse food reaction 02/01/2020   Moderate persistent asthma without complication 48/54/6270   Complication of anesthesia    Fibromyalgia 02/14/2018   History of gastroesophageal reflux (GERD) 02/14/2018   History of asthma 02/14/2018   Rheumatoid arthritis involving multiple sites with positive rheumatoid factor ([place]) 11/05/2016   High risk medication use 11/05/2016   LPRD (laryngopharyngeal reflux disease) 09/20/2015   Seasonal and perennial allergic rhinoconjunctivitis 09/20/2015   Acute sinusitis 07/29/2015   S/P total hysterectomy 05/25/2015    Past Medical History:  Diagnosis Date   Anemia    Asthma    Complication of anesthesia    Eczema    Esophageal dysphagia    GERD (gastroesophageal reflux disease)    PONV (postoperative nausea and vomiting)    Rheumatoid arthritis ([place])     Family History  Problem Relation Age of Onset   Asthma Mother    Eczema Mother    Sarcoidosis Mother    Hypertension Mother    Cancer Father    Hypotension Father    Congestive Heart Failure Father  Colon cancer Father    Multiple myeloma Father    Asthma Sister    GER disease Sister    Lung cancer Maternal Grandfather        smoked   Allergic rhinitis Neg Hx    Angioedema Neg Hx    Immunodeficiency Neg Hx    Urticaria Neg Hx    Esophageal cancer Neg Hx    Rectal cancer Neg Hx    Stomach cancer Neg Hx    Past Surgical History:  Procedure Laterality Date   ABDOMINAL HYSTERECTOMY      partial age 12   BILATERAL SALPINGECTOMY Bilateral 05/25/2015   Procedure: BILATERAL SALPINGECTOMY;  Surgeon: Servando Salina, MD;  Location: [facility];  Service: Gynecology;  Laterality: Bilateral;   CARPAL TUNNEL RELEASE Left 01/05/2020   Procedure: LEFT CARPAL TUNNEL RELEASE;  Surgeon: Daryll Brod, MD;  Location: [facility];  Service: Orthopedics;  Laterality: Left;  FOREARM BIER BLOCK   COLONOSCOPY     OVARIAN CYST REMOVAL Right 05/25/2015   Procedure: OVARIAN CYSTECTOMY;  Surgeon: Servando Salina, MD;  Location: [facility];  Service: Gynecology;  Laterality: Right;   TONSILLECTOMY     TUBAL LIGATION     Social History   Social History Narrative   Right handed    Caffeine- 2 cups per day    Immunization History  Administered Date(s) Administered   Influenza Inj Mdck Quad Pf 10/19/2017   Influenza Split 11/25/2014   Influenza, Quadrivalent, Recombinant, Inj, Pf 08/26/2019   Influenza, Seasonal, Injecte, Preservative Fre 09/20/2015   Influenza,inj,Quad PF,6+ Mos 08/04/2018   Influenza-Unspecified 07/18/2017   PFIZER(Purple Top)SARS-COV-2 Vaccination 11/28/2019, 12/19/2019, 06/23/2020     Objective: Vital Signs: BP (!) 169/83 (BP Location: Left Arm, Patient Position: Sitting, Cuff Size: Normal)   Pulse 76   Resp 16   Ht 5' 1.25" (1.556 m)   Wt 188 lb (85.3 kg)   LMP 05/19/2015   BMI 35.23 kg/m    Physical Exam Vitals and nursing note reviewed.  Constitutional:      Appearance: She is well-developed.  HENT:     Head: Normocephalic and atraumatic.  Eyes:     Conjunctiva/sclera: Conjunctivae normal.  Cardiovascular:     Rate and Rhythm: Normal rate and regular rhythm.     Heart sounds: Normal heart sounds.  Pulmonary:     Effort: Pulmonary effort is normal.     Breath sounds: Normal breath sounds.  Abdominal:     General: Bowel sounds are normal.     Palpations: Abdomen is soft.  Musculoskeletal:     Cervical back: Normal range of motion.  Skin:     General: Skin is warm and dry.     Capillary Refill: Capillary refill takes less than 2 seconds.  Neurological:     Mental Status: She is alert and oriented to person, place, and time.  Psychiatric:        Behavior: Behavior normal.      Musculoskeletal Exam: C-spine, thoracic spine, and lumbar spine good ROM.  No midline spinal tenderness or SI joint tenderness upon palpation.  Shoulder joints, elbow joints, wrist joints, MCPs, PIPs, and DIPs good ROM with no synovitis.  Complete fist formation bilaterally.  Hip joints, knee joints, and ankle joints have good ROM with no discomfort.  No warmth or effusion of knee joints.  No tenderness or swelling of ankle joints.   CDAI Exam: CDAI Score: -- Patient Global: 2 mm; Provider Global: 2 mm Swollen: --; Tender: -- Joint  Exam 09/05/2022   No joint exam has been documented for this visit   There is currently no information documented on the homunculus. Go to the Rheumatology activity and complete the homunculus joint exam.  Investigation: No additional findings.  Imaging: No results found.  Recent Labs: Lab Results  Component Value Date   WBC 3.9 06/05/2022   HGB 12.7 06/05/2022   PLT 227 06/05/2022   NA 142 06/05/2022   K 4.8 06/05/2022   CL 106 06/05/2022   CO2 29 06/05/2022   GLUCOSE 95 06/05/2022   BUN 13 06/05/2022   CREATININE 0.60 06/05/2022   BILITOT 0.4 06/05/2022   ALKPHOS 79 04/24/2022   AST 15 06/05/2022   ALT 16 06/05/2022   PROT 7.2 06/05/2022   ALBUMIN 4.2 04/24/2022   CALCIUM 9.8 06/05/2022   GFRAA 118 03/16/2021   QFTBGOLDPLUS NEGATIVE 09/22/2021    Speciality Comments: PLQ Eye Exam: 12/08/2021  WNL at [facility] Follow up in 1 year  Procedures:  No procedures performed Allergies: Peanut-containing drug products, Aspirin, and Augmentin [amoxicillin-pot clavulanate]   Assessment / Plan:     Visit Diagnoses: Rheumatoid arthritis involving multiple sites with positive rheumatoid factor ([place]): She has  no synovitis on examination today.  Overall her rheumatoid arthritis remains well-controlled on Orencia 125 mg subcutaneous injections once weekly, Rasuvo 15 mg sq injections once weekly, and Plaquenil 200 mg 1 tablet by mouth twice daily Monday through Friday.  She has been tolerating triple therapy without any side effects and has not missed any doses recently.  She had a flare at the beginning of November and both hands requiring a prednisone taper which resolved her symptoms.  She has not had any recurrence of a flare since then.  She has no inflammation on examination today.  No medication changes will be made at this time.  A refill of Plaquenil was sent to the pharmacy.  She was advised to notify us if she develops signs or symptoms of a flare.  She will follow-up in the office in 3 months or sooner if needed.  High risk medication use - Orencia 125 mg sq injections once weekly, Rasuvo 15 mg sq injections once weekly, and Plaquenil 200 mg 1 tablet by mouth twice daily Monday through Friday.  PLQ Eye Exam: 12/08/2021 WNL at [facility] Follow up in 1 year.  CBC and CMP were drawn on 06/05/2022.  Orders for CBC and CMP were released today.  Her next lab work will be due in March and every 3 months to monitor for drug toxicity. TB Gold negative on 09/22/2021. - Plan: CBC with Differential/Platelet, COMPLETE METABOLIC PANEL WITH GFR, QuantiFERON-TB Gold Plus No recent or recurrent infections.  Discussed the importance of holding Orencia and Rasuvo if she develops signs or symptoms of an infection and to resume once the infection is completely cleared.  Screening for tuberculosis -Order for TB gold released today.  Plan: QuantiFERON-TB Gold Plus  Trigger middle finger of right hand - Resolved.  Fibromyalgia: She experiences intermittent myalgias and muscle tenderness due to fibromyalgia.  She has been getting vitamin infusions once monthly which she feels has helped with her generalized pain and fatigue.   Discussed the importance of regular exercise and good sleep hygiene.  Positive ANA (antinuclear antibody) - 07/13/20: ANA is positive-low, nonspecific titer. RNP remains positive. Complements WNL. dsDNA negative.  No clinical features of systemic lupus at this time.   Anti-RNP antibodies present  Sicca syndrome (HCC) - [place] antibodies  negative.  She continues to have chronic sicca symptoms which have been tolerable.   Osteopenia of spine - DEXA 08/24/2019: AP spine BMD 0.909 with T score -1.3.  Due to update DEXA.  Future order for DEXA placed today.   Other medical conditions are listed as follows:  History of Helicobacter pylori infection  History of asthma  History of gastroesophageal reflux (GERD)  Abnormal SPEP  History of depression   Orders: Orders Placed This Encounter  Procedures   CBC with Differential/Platelet   COMPLETE METABOLIC PANEL WITH GFR   QuantiFERON-TB Gold Plus   No orders of the defined types were placed in this encounter.    Follow-Up Instructions: Return in about 3 months (around 12/05/2022) for Rheumatoid arthritis, Fibromyalgia.   Ofilia Neas, PA-C  Note - This record has been created using Dragon software.  Chart creation errors have been sought, but may not always  have been located. Such creation errors do not reflect on  the standard of medical care.

## 2022-08-26 NOTE — Progress Notes (Unsigned)
Synopsis: Referred for chronic cough by Lin Landsman, MD  Subjective:   PATIENT ID: Julie [name] GENDER: female DOB: [birth year], MRN: [id number]  No chief complaint on file.  56yF with history of OSA (split night PSG 01/07/22 with severe OSA AHI 35.9, 6 min with o2 sat below 88%), asthma, eczema, seropositive RA on orencia/rasuvo/plaquenil, severe GERD and esophageal stricture s/p dilation 2022, h pylori infection, covid-19 infection 08/2021 severe but not hospitalized referred for cough. Has been vaccinated for covid-19.   She has had cough since December when she had covid-19. Cough is dry. She has no significant DOE. She uses pantoprazole for severe GERD. She is using albuterol and pulmicort nebs once every evening, still on dupixent. She has sinus congestion, maybe a trickle of postnasal drainage.   Mother with sarcoid, asthma  Never smoker. No vaping, MJ. She is a Copy. She has never lived outside of [place]  Interval HPI: Seen by Dr. Rowe Clack at [facility] 9/20 and found to have candida laryngitis, muscle tension dysphonia, laryngeal spasm referred for cough suppression therapy, voice therapy and started on diflucan. Slight improvement in cough sine then. Seems to notice cough primarily in context of eating.  PFT with nonspecific ventilatory defect.  ---------------------------------------- Continued on CPAP 12, flonase, singulair, ppi, antihistamine, holding ICS  Otherwise pertinent review of systems is negative.  Past Medical History:  Diagnosis Date   Anemia    Asthma    Complication of anesthesia    Eczema    Esophageal dysphagia    GERD (gastroesophageal reflux disease)    PONV (postoperative nausea and vomiting)    Rheumatoid arthritis ([place])      Family History  Problem Relation Age of Onset   Asthma Mother    Eczema Mother    Sarcoidosis Mother    Hypertension Mother    Cancer Father    Hypotension Father    Congestive Heart Failure Father     Colon cancer Father    Multiple myeloma Father    Asthma Sister    GER disease Sister    Lung cancer Maternal Grandfather        smoked   Allergic rhinitis Neg Hx    Angioedema Neg Hx    Immunodeficiency Neg Hx    Urticaria Neg Hx    Esophageal cancer Neg Hx    Rectal cancer Neg Hx    Stomach cancer Neg Hx      Past Surgical History:  Procedure Laterality Date   ABDOMINAL HYSTERECTOMY     partial age 81   BILATERAL SALPINGECTOMY Bilateral 05/25/2015   Procedure: BILATERAL SALPINGECTOMY;  Surgeon: Servando Salina, MD;  Location: [facility];  Service: Gynecology;  Laterality: Bilateral;   CARPAL TUNNEL RELEASE Left 01/05/2020   Procedure: LEFT CARPAL TUNNEL RELEASE;  Surgeon: Daryll Brod, MD;  Location: [facility];  Service: Orthopedics;  Laterality: Left;  FOREARM BIER BLOCK   COLONOSCOPY     OVARIAN CYST REMOVAL Right 05/25/2015   Procedure: OVARIAN CYSTECTOMY;  Surgeon: Servando Salina, MD;  Location: [facility];  Service: Gynecology;  Laterality: Right;   TONSILLECTOMY     TUBAL LIGATION      Social History   Socioeconomic History   Marital status: Married    Spouse name: Not on file   Number of children: Not on file   Years of education: Not on file   Highest education level: Not on file  Occupational History   Not on file  Tobacco Use  Smoking status: Never    Passive exposure: Never   Smokeless tobacco: Never  Vaping Use   Vaping Use: Never used  Substance and Sexual Activity   Alcohol use: No   Drug use: No   Sexual activity: Yes  Other Topics Concern   Not on file  Social History Narrative   Right handed    Caffeine- 2 cups per day    Social Determinants of Health   Financial Resource Strain: Not on file  Food Insecurity: Not on file  Transportation Needs: Not on file  Physical Activity: Not on file  Stress: Not on file  Social Connections: Not on file  Intimate Partner Violence: Not on file     Allergies  Allergen Reactions    Peanut-Containing Drug Products Anaphylaxis   Aspirin Other (See Comments)    pt couldn't take while on methotrexate, but is no longer taking   Augmentin [Amoxicillin-Pot Clavulanate] Other (See Comments)    G I upset     Outpatient Medications Prior to Visit  Medication Sig Dispense Refill   albuterol (PROVENTIL) (2.5 MG/3ML) 0.083% nebulizer solution Take 3 mLs (2.5 mg total) by nebulization every 4 (four) hours as needed for wheezing or shortness of breath. 150 mL 2   albuterol (VENTOLIN HFA) 108 (90 Base) MCG/ACT inhaler Inhale 2 puffs into the lungs every 6 (six) hours as needed for wheezing or shortness of breath. 18 g 1   azelastine (ASTELIN) 0.1 % nasal spray Place 1 spray into both nostrils 2 (two) times daily. Use in each nostril as directed 30 mL 5   B-D TB SYRINGE 1CC/27GX1/2" 27G X 1/2" 1 ML MISC Use to inject Methotrexate weekly. 12 each 3   budesonide (PULMICORT) 0.5 MG/2ML nebulizer solution Take 2 mLs (0.5 mg total) by nebulization 2 (two) times daily. 150 mL 2   cetirizine (ZYRTEC) 10 MG tablet Take 1 tablet (10 mg total) by mouth in the morning. TAKE 1 TABLET EVERY DAY 30 tablet 5   diclofenac Sodium (VOLTAREN) 1 % GEL Apply 2g to 4g to affected area up to 4 times daily as needed. 400 g 4   DUPIXENT 300 MG/2ML prefilled syringe INJECT 300MG SUBCUTANEOUSLY EVERY OTHER WEEK 4 mL 11   EPINEPHrine 0.3 mg/0.3 mL IJ SOAJ injection Inject 0.3 mg into the muscle as needed for anaphylaxis. 2 each 2   fluticasone (FLONASE) 50 MCG/ACT nasal spray Place 1 spray into both nostrils daily. 16 g 6   folic acid (FOLVITE) 1 MG tablet TAKE 1 TABLET BY MOUTH TWICE A DAY 60 tablet 11   hydroxychloroquine (PLAQUENIL) 200 MG tablet TAKE 1 TABLET TWICE A DAY MONDAY -FRIDAY 40 tablet 2   levocetirizine (XYZAL) 5 MG tablet Take 1 tablet (5 mg total) by mouth at bedtime. 30 tablet 5   Lifitegrast (XIIDRA) 5 % SOLN Place 1 drop into both eyes 2 times daily.     Methotrexate, PF, (RASUVO) 15 MG/0.3ML  SOAJ Inject 15 mg into the skin once a week. 3.6 mL 0   montelukast (SINGULAIR) 10 MG tablet Take 1 tablet (10 mg total) by mouth at bedtime. 30 tablet 5   Multiple Vitamins-Minerals (DAILY MULTIVITAMIN PO) Take 1 tablet by mouth daily.      mupirocin ointment (BACTROBAN) 2 % Apply 1 Application topically 2 (two) times daily. (Patient not taking: Reported on 06/13/2022) 22 g 3   omeprazole (PRILOSEC) 40 MG capsule Take 1 capsule (40 mg total) by mouth in the morning and at bedtime. [place]  capsule 5   ORENCIA CLICKJECT 229 MG/ML SOAJ INJECT 125MG SUBCUTANEOUSLY ONCE WEEKLY 4 mL 2   predniSONE (DELTASONE) 5 MG tablet Take 4 tabs po x 4 days, 3  tabs po x 4 days, 2  tabs po x 4 days, 1  tab po x 4 days 40 tablet 0   Facility-Administered Medications Prior to Visit  Medication Dose Route Frequency Provider Last Rate Last Admin   Dupilumab SOSY 300 mg  300 mg Subcutaneous Q14 Days Ambs, Kathrine Cords, FNP   300 mg at 08/15/22 1413       Objective:   Physical Exam:  General appearance: 56 y.o., female, NAD, conversant  Eyes: anicteric sclerae; PERRL, tracking appropriately HENT: NCAT; MMM Neck: Trachea midline; no lymphadenopathy, no JVD Lungs: CTAB, no crackles, no wheeze, with normal respiratory effort CV: RRR, no murmur  Abdomen: Soft, non-tender; non-distended, BS present  Extremities: No peripheral edema, warm Skin: Normal turgor and texture; no rash Psych: Appropriate affect Neuro: Alert and oriented to person and place, no focal deficit     There were no vitals filed for this visit.     on RA BMI Readings from Last 3 Encounters:  06/13/22 34.13 kg/m  06/12/22 34.07 kg/m  06/05/22 33.96 kg/m   Wt Readings from Last 3 Encounters:  06/13/22 183 lb (83 kg)  06/12/22 181 lb 12.8 oz (82.5 kg)  06/05/22 181 lb 3.2 oz (82.2 kg)     CBC    Component Value Date/Time   WBC 3.9 06/05/2022 0914   RBC 4.44 06/05/2022 0914   HGB 12.7 06/05/2022 0914   HGB 12.1 04/24/2022 0901   HGB  13.4 10/02/2016 1602   HCT 38.7 06/05/2022 0914   HCT 40.0 10/02/2016 1602   PLT 227 06/05/2022 0914   PLT 214 04/24/2022 0901   PLT 266 10/02/2016 1602   MCV 87.2 06/05/2022 0914   MCV 85 10/02/2016 1602   MCH 28.6 06/05/2022 0914   MCHC 32.8 06/05/2022 0914   RDW 12.1 06/05/2022 0914   RDW 12.5 10/02/2016 1602   LYMPHSABS 1,498 06/05/2022 0914   LYMPHSABS 1.5 10/02/2016 1602   MONOABS 0.2 04/24/2022 0901   EOSABS 98 06/05/2022 0914   EOSABS 0.1 10/02/2016 1602   BASOSABS 31 06/05/2022 0914   BASOSABS 0.0 10/02/2016 1602    Chest Imaging: CXR 01/31/22 reviewed by me unremarkable  Split night PSG 01/08/22: severe OSA AHI 35.9, 6 min with o2 sat below 88%  Pulmonary Functions Testing Results:    Latest Ref Rng & Units 06/13/2022    9:55 AM  PFT Results  FVC-Pre L 2.08   FVC-Predicted Pre % 65   FVC-Post L 1.99   FVC-Predicted Post % 62   Pre FEV1/FVC % % 86   Post FEV1/FCV % % 89   FEV1-Pre L 1.78   FEV1-Predicted Pre % 71   FEV1-Post L 1.77   DLCO uncorrected ml/min/mmHg 20.01   DLCO UNC% % 105   DLCO corrected ml/min/mmHg 20.47   DLCO COR %Predicted % 107   DLVA Predicted % 143   TLC L 4.09   TLC % Predicted % 87   RV % Predicted % 115    PFT 06/13/22 with nonspecific ventilatory defect, low ERV, flow volume loops look ok     Assessment & Plan:   # chronic cough On video stroboscopy 9/20 noted to have candida laryngitis, muscle tension dysphonia, laryngeal spasm and referred to speech, voice tx. Looks like there wasn't any clear evidence  of LPR. Postnasal drainage may also contribute to cough. PFTs aren't suggestive of ILD or deteriorating asthma control.   # Severe OSA   # moderate persistent asthma, eosinophilic asthma  Plan: - continue CPAP 12 cm H2O, if still borderline residual AHI next visit, consideration of autoPAP - voice tx, speech tx per ENT - take shower, clear nose of crusting, continue flonase 1 spray each nostril  - holding ICS in setting  candida laryngitis, completing course of fluconazole - continue albuterol daily/prn - continue pantoprazole 40 mg daily - continue xyzal 5 mg daily - continue singulair 10 mg daily - dupixent per allergy   RTC 3 months to review progress with CPAP  Maryjane Hurter, MD [facility] 08/26/2022 4:02 PM

## 2022-08-28 ENCOUNTER — Ambulatory Visit: Payer: 59 | Admitting: Student

## 2022-08-28 ENCOUNTER — Encounter: Payer: Self-pay | Admitting: Student

## 2022-08-28 VITALS — BP 138/82 | HR 88 | Temp 98.1°F | Ht 61.5 in | Wt 189.4 lb

## 2022-08-28 DIAGNOSIS — G4733 Obstructive sleep apnea (adult) (pediatric): Secondary | ICD-10-CM

## 2022-08-28 DIAGNOSIS — R053 Chronic cough: Secondary | ICD-10-CM | POA: Diagnosis not present

## 2022-08-28 NOTE — Patient Instructions (Signed)
-   keep up the good work with voice therapy, CPAP - agree with continuing omeprazole 40 mg twice daily before meals, singulair 10 mg daily, xyzal 5 mg daily - would still lay off of pulmicort nebs due to thrush - take shower, clear nose of crusting then flonase 1 spray each nostril  - see you in 6 months

## 2022-08-29 ENCOUNTER — Ambulatory Visit (INDEPENDENT_AMBULATORY_CARE_PROVIDER_SITE_OTHER): Payer: 59

## 2022-08-29 DIAGNOSIS — J4540 Moderate persistent asthma, uncomplicated: Secondary | ICD-10-CM | POA: Diagnosis not present

## 2022-09-05 ENCOUNTER — Other Ambulatory Visit: Payer: Self-pay | Admitting: Physician Assistant

## 2022-09-05 ENCOUNTER — Encounter: Payer: Self-pay | Admitting: Physician Assistant

## 2022-09-05 ENCOUNTER — Ambulatory Visit: Payer: 59 | Attending: Physician Assistant | Admitting: Physician Assistant

## 2022-09-05 ENCOUNTER — Other Ambulatory Visit (HOSPITAL_COMMUNITY): Payer: Self-pay

## 2022-09-05 VITALS — BP 169/83 | HR 76 | Resp 16 | Ht 61.25 in | Wt 188.0 lb

## 2022-09-05 DIAGNOSIS — Z8619 Personal history of other infectious and parasitic diseases: Secondary | ICD-10-CM

## 2022-09-05 DIAGNOSIS — R768 Other specified abnormal immunological findings in serum: Secondary | ICD-10-CM

## 2022-09-05 DIAGNOSIS — M8588 Other specified disorders of bone density and structure, other site: Secondary | ICD-10-CM

## 2022-09-05 DIAGNOSIS — M65331 Trigger finger, right middle finger: Secondary | ICD-10-CM | POA: Diagnosis not present

## 2022-09-05 DIAGNOSIS — M797 Fibromyalgia: Secondary | ICD-10-CM | POA: Diagnosis not present

## 2022-09-05 DIAGNOSIS — M3500 Sicca syndrome, unspecified: Secondary | ICD-10-CM

## 2022-09-05 DIAGNOSIS — Z8719 Personal history of other diseases of the digestive system: Secondary | ICD-10-CM

## 2022-09-05 DIAGNOSIS — Z8659 Personal history of other mental and behavioral disorders: Secondary | ICD-10-CM

## 2022-09-05 DIAGNOSIS — M0579 Rheumatoid arthritis with rheumatoid factor of multiple sites without organ or systems involvement: Secondary | ICD-10-CM | POA: Diagnosis not present

## 2022-09-05 DIAGNOSIS — Z8709 Personal history of other diseases of the respiratory system: Secondary | ICD-10-CM

## 2022-09-05 DIAGNOSIS — Z79899 Other long term (current) drug therapy: Secondary | ICD-10-CM

## 2022-09-05 DIAGNOSIS — Z111 Encounter for screening for respiratory tuberculosis: Secondary | ICD-10-CM

## 2022-09-05 DIAGNOSIS — R778 Other specified abnormalities of plasma proteins: Secondary | ICD-10-CM

## 2022-09-05 DIAGNOSIS — R7689 Other specified abnormal immunological findings in serum: Secondary | ICD-10-CM

## 2022-09-05 NOTE — Telephone Encounter (Signed)
Next Visit: 09/05/2022   Last Visit: 06/05/2022   Last Fill: 06/05/2022  VG:VSYVGCYOYO arthritis involving multiple sites with positive rheumatoid factor    Current Dose per office note 06/05/2022: Plaquenil 200 mg 1 tablet by mouth twice daily Monday through Friday.   Labs: 06/05/2022 CBC and CMP WNL    PLQ Eye Exam: 12/08/2021    Okay to refill PLQ?

## 2022-09-05 NOTE — Patient Instructions (Signed)
Standing Labs We placed an order today for your standing lab work.   Please have your standing labs drawn in March and every 3 months   Please have your labs drawn 2 weeks prior to your appointment so that the provider can discuss your lab results at your appointment.  Please note that you may see your imaging and lab results in MyChart before we have reviewed them. We will contact you once all results are reviewed. Please allow our office up to 72 hours to thoroughly review all of the results before contacting the office for clarification of your results.  Lab hours are:   Monday through Thursday from 8:00 am -12:30 pm and 1:00 pm-5:00 pm and Friday from 8:00 am-12:00 pm.  Please be advised, all patients with office appointments requiring lab work will take precedent over walk-in lab work.   Labs are drawn by [facility]. Please bring your co-pay at the time of your lab draw.  You may receive a bill from [facility] for your lab work.  Please note if you are on Hydroxychloroquine and and an order has been placed for a Hydroxychloroquine level, you will need to have it drawn 4 hours or more after your last dose.  If you wish to have your labs drawn at another location, please call the office 24 hours in advance so we can fax the orders.  The office is located at [address], [address], [place], [place] [address] No appointment is necessary.    If you have any questions regarding directions or hours of operation,  please call [phone].   As a reminder, please drink plenty of water prior to coming for your lab work. Thanks!  If you have signs or symptoms of an infection or start antibiotics: First, call your PCP for workup of your infection. Hold your medication through the infection, until you complete your antibiotics, and until symptoms resolve if you take the following: Injectable medication (Actemra, Benlysta, Cimzia, Cosentyx, Enbrel, Humira, Kevzara, Orencia, Remicade, Simponi,  Stelara, Taltz, Tremfya) Methotrexate Leflunomide (Arava) Mycophenolate (Cellcept) Xeljanz, Olumiant, or Rinvoq   Vaccines You are taking a medication(s) that can suppress your immune system.  The following immunizations are recommended: Flu annually Covid-19  Td/Tdap (tetanus, diphtheria, pertussis) every 10 years Pneumonia (Prevnar 15 then Pneumovax 23 at least 1 year apart.  Alternatively, can take Prevnar 20 without needing additional dose) Shingrix: 2 doses from 4 weeks to 6 months apart  Please check with your PCP to make sure you are up to date.   

## 2022-09-05 NOTE — Progress Notes (Signed)
CBC WNL

## 2022-09-06 NOTE — Progress Notes (Signed)
CMP WNL

## 2022-09-07 LAB — CBC WITH DIFFERENTIAL/PLATELET
Absolute Monocytes: 291 cells/uL (ref 200–950)
Basophils Absolute: 29 cells/uL (ref 0–200)
Basophils Relative: 0.7 %
Eosinophils Absolute: 103 cells/uL (ref 15–500)
Eosinophils Relative: 2.5 %
HCT: 36.3 % (ref 35.0–45.0)
Hemoglobin: 12.1 g/dL (ref 11.7–15.5)
Lymphs Abs: 1558 cells/uL (ref 850–3900)
MCH: 28.4 pg (ref 27.0–33.0)
MCHC: 33.3 g/dL (ref 32.0–36.0)
MCV: 85.2 fL (ref 80.0–100.0)
MPV: 10.7 fL (ref 7.5–12.5)
Monocytes Relative: 7.1 %
Neutro Abs: 2120 cells/uL (ref 1500–7800)
Neutrophils Relative %: 51.7 %
Platelets: 240 10*3/uL (ref 140–400)
RBC: 4.26 10*6/uL (ref 3.80–5.10)
RDW: 12.1 % (ref 11.0–15.0)
Total Lymphocyte: 38 %
WBC: 4.1 10*3/uL (ref 3.8–10.8)

## 2022-09-07 LAB — COMPLETE METABOLIC PANEL WITH GFR
AG Ratio: 1.6 (calc) (ref 1.0–2.5)
ALT: 25 U/L (ref 6–29)
AST: 17 U/L (ref 10–35)
Albumin: 4.2 g/dL (ref 3.6–5.1)
Alkaline phosphatase (APISO): 76 U/L (ref 37–153)
BUN: 13 mg/dL (ref 7–25)
CO2: 31 mmol/L (ref 20–32)
Calcium: 9.4 mg/dL (ref 8.6–10.4)
Chloride: 105 mmol/L (ref 98–110)
Creat: 0.57 mg/dL (ref 0.50–1.03)
Globulin: 2.6 g/dL (calc) (ref 1.9–3.7)
Glucose, Bld: 100 mg/dL — ABNORMAL HIGH (ref 65–99)
Potassium: 4.6 mmol/L (ref 3.5–5.3)
Sodium: 141 mmol/L (ref 135–146)
Total Bilirubin: 0.3 mg/dL (ref 0.2–1.2)
Total Protein: 6.8 g/dL (ref 6.1–8.1)
eGFR: 108 mL/min/{1.73_m2} (ref >=60)

## 2022-09-07 LAB — QUANTIFERON-TB GOLD PLUS
Mitogen-NIL: 8.3 IU/mL
NIL: 0.02 IU/mL
QuantiFERON-TB Gold Plus: NEGATIVE
TB1-NIL: 0 IU/mL
TB2-NIL: 0 IU/mL

## 2022-09-12 NOTE — Progress Notes (Signed)
TB gold negative

## 2022-09-14 ENCOUNTER — Telehealth: Payer: Self-pay | Admitting: *Deleted

## 2022-09-14 ENCOUNTER — Encounter: Payer: Self-pay | Admitting: *Deleted

## 2022-09-14 NOTE — Telephone Encounter (Signed)
Received DEXA results from [facility].  Date of Scan: 09/12/2022  Lowest T-score:-2.2  BMD:0.808  Lowest site measured:AP Spine  DX: Osteopenia  Significant changes in BMD and site measured (5% and above):-11 % Total AP Spine  Current Regimen:n/a  Recommendation:Calcium, Vitamin D and resistive exercises.  Statically significant decrease in AP Spine.   Reviewed by:Hazel Sams, PA-C  Next Appointment:  12/06/2022  Patient advised of results and recommendations.

## 2022-09-17 DIAGNOSIS — L439 Lichen planus, unspecified: Secondary | ICD-10-CM

## 2022-09-17 HISTORY — DX: Lichen planus, unspecified: L43.9

## 2022-09-18 ENCOUNTER — Ambulatory Visit: Payer: 59 | Admitting: Allergy & Immunology

## 2022-09-20 ENCOUNTER — Other Ambulatory Visit: Payer: Self-pay

## 2022-09-20 ENCOUNTER — Encounter: Payer: Self-pay | Admitting: Allergy & Immunology

## 2022-09-20 ENCOUNTER — Ambulatory Visit (INDEPENDENT_AMBULATORY_CARE_PROVIDER_SITE_OTHER): Payer: 59 | Admitting: Allergy & Immunology

## 2022-09-20 VITALS — BP 140/98 | HR 84 | Temp 98.2°F | Resp 16 | Ht 61.25 in | Wt 186.5 lb

## 2022-09-20 DIAGNOSIS — K219 Gastro-esophageal reflux disease without esophagitis: Secondary | ICD-10-CM

## 2022-09-20 DIAGNOSIS — J302 Other seasonal allergic rhinitis: Secondary | ICD-10-CM

## 2022-09-20 DIAGNOSIS — R490 Dysphonia: Secondary | ICD-10-CM | POA: Diagnosis not present

## 2022-09-20 DIAGNOSIS — J4540 Moderate persistent asthma, uncomplicated: Secondary | ICD-10-CM | POA: Diagnosis not present

## 2022-09-20 DIAGNOSIS — J383 Other diseases of vocal cords: Secondary | ICD-10-CM

## 2022-09-20 DIAGNOSIS — H1013 Acute atopic conjunctivitis, bilateral: Secondary | ICD-10-CM

## 2022-09-20 DIAGNOSIS — J3089 Other allergic rhinitis: Secondary | ICD-10-CM

## 2022-09-20 DIAGNOSIS — T7800XD Anaphylactic reaction due to unspecified food, subsequent encounter: Secondary | ICD-10-CM

## 2022-09-20 NOTE — Patient Instructions (Addendum)
1. Moderate persistent asthma - complicated with recent [id number] infection - Lung testing not done today.  - Continue to follow up Dr. Rowe Clack.  - Continue to follow with Dr. Verlee Monte.  - We are not going to make any medication changes.  - Daily controller medication(s): albuterol neb solution 1-2 TIMES DAILY via nebulizer and Dupixent '300mg'$  every 2 weeks  - Prior to physical activity: albuterol 2 puffs 10-15 minutes before physical activity - Rescue medications: albuterol 4 puffs every 4-6 hours as needed or albuterol nebulizer one vial puffs every 4-6 hours as needed  - Asthma control goals:  * Full participation in all desired activities (may need albuterol before activity) * Albuterol use two time or less a week on average (not counting use with activity) * Cough interfering with sleep two time or less a month * Oral steroids no more than once a year * No hospitalizations  2. LPRD (laryngopharyngeal reflux disease) - Continue with omeprazole TWICE DAILY.  - Add on Pepcid '40mg'$  twice daily for THREE WEEKS (I sent in liquid form) - You can try using Maalox per package directions for THREE WEEKS.  - This might help with your current hoarseness.  3. Seasonal allergic rhinitis - Continue with alternating antihistamines daily (change every 2-3 months). - Continue with Astelin one spray per nostril up to twice daily as needed.  - Continue with Ayr nasal saline gel.   4. Food allergies - Continue to avoid peanuts and tree nuts.  5. Return in about 6 months (around 03/21/2023).    Please inform us of any Emergency Department visits, hospitalizations, or changes in symptoms. Call us before going to the ED for breathing or allergy symptoms since we might be able to fit you in for a sick visit. Feel free to contact us anytime with any questions, problems, or concerns.  It was a pleasure to see you again today!  Websites that have reliable patient information: 1. American Academy of Asthma,  Allergy, and Immunology: www.aaaai.org 2. Food Allergy Research and Education (FARE): foodallergy.org 3. Mothers of Asthmatics: http://www.asthmacommunitynetwork.org 4. American College of Allergy, Asthma, and Immunology: www.acaai.org   COVID-19 Vaccine Information can be found at: ShippingScam.co.uk For questions related to vaccine distribution or appointments, please email [email or web address] or call [phone].   We realize that you might be concerned about having an allergic reaction to the COVID19 vaccines. To help with that concern, WE ARE OFFERING THE COVID19 VACCINES IN OUR OFFICE! Ask the front desk for dates!     "Like" [place] on Facebook and Instagram for our latest updates!      A healthy democracy works best when [employer] participate! Make sure you are registered to vote! If you have moved or changed any of your contact information, you will need to get this updated before voting!  In some cases, you MAY be able to register to vote online: [email or web address]

## 2022-09-20 NOTE — Progress Notes (Signed)
FOLLOW UP  Date of Service/Encounter:  09/20/22   Assessment:   Chronic rhinosinusitis (grasses, weeds, trees, indoor and outdoor molds, cat, dog, dust mite) - via blood work in March 2021    Moderate persistent asthma - with worsening symptoms since contracting COVID19 earlier in the year   Rheumatoid arthritis - on MTX, folic acid, and Plaquenil   Difficulty using the Dupixent autoinjector due to coexisting RA (receives injections in our office)   History of migraines   Xerostomia - with positive ENA RNP Ab   Long COVID syndrome   Monoclonal gammopathy of unknown significance - followed by Dr. Chryl Heck  Plan/Recommendations:   1. Moderate persistent asthma - complicated with recent [id number] infection - Lung testing not done today.  - Continue to follow up Dr. Rowe Clack.  - Continue to follow with Dr. Verlee Monte.  - We are not going to make any medication changes.  - Daily controller medication(s): albuterol neb solution 1-2 TIMES DAILY via nebulizer and Dupixent '300mg'$  every 2 weeks  - Prior to physical activity: albuterol 2 puffs 10-15 minutes before physical activity - Rescue medications: albuterol 4 puffs every 4-6 hours as needed or albuterol nebulizer one vial puffs every 4-6 hours as needed  - Asthma control goals:  * Full participation in all desired activities (may need albuterol before activity) * Albuterol use two time or less a week on average (not counting use with activity) * Cough interfering with sleep two time or less a month * Oral steroids no more than once a year * No hospitalizations  2. LPRD (laryngopharyngeal reflux disease) - Continue with omeprazole TWICE DAILY.  - Add on Pepcid '40mg'$  twice daily for THREE WEEKS (I sent in liquid form) - You can try using Maalox per package directions for THREE WEEKS.  - This might help with your current hoarseness.  3. Seasonal allergic rhinitis - Continue with alternating antihistamines daily (change every 2-3  months). - Continue with Astelin one spray per nostril up to twice daily as needed.  - Continue with Ayr nasal saline gel.   4. Food allergies - Continue to avoid peanuts and tree nuts.  5. Return in about 6 months (around 03/21/2023).   Subjective:   Julie [name] is a 57 y.o. female presenting today for follow up of  Chief Complaint  Patient presents with   Follow-up    No concerns    Julie [name] has a history of the following: Patient Active Problem List   Diagnosis Date Noted   Witnessed episode of apnea 01/07/2022   Viral illness 09/08/2021   Anaphylactic shock due to adverse food reaction 02/01/2020   Moderate persistent asthma without complication 17/40/8144   Complication of anesthesia    Fibromyalgia 02/14/2018   History of gastroesophageal reflux (GERD) 02/14/2018   History of asthma 02/14/2018   Rheumatoid arthritis involving multiple sites with positive rheumatoid factor ([place]) 11/05/2016   High risk medication use 11/05/2016   LPRD (laryngopharyngeal reflux disease) 09/20/2015   Seasonal and perennial allergic rhinoconjunctivitis 09/20/2015   Acute sinusitis 07/29/2015   S/P total hysterectomy 05/25/2015    History obtained from: chart review and patient.  Julie is a 57 y.o. female presenting for a follow up visit. She was last seen in September 2023. At that time, we continued with Dupixent every 2 weeks and albuterol 1-2 times daily. She continued to follow with Dr. Rowe Clack as well as Dr. Verlee Monte. For her GERD, we continued with omeprazole but increased to twice  daily. We stopped the Protonix. For her rhinitis, we continued with her alternating antihistamine as well as Astelin and Ayr nasal saline gel. She continued to avoid peanuts and tree nuts.   Since the last visit, she has not done well. She unfortunately is going through some marriage issues. Her husband has asked for a divorce and is not amenable to any kind of couples counseling. She subsequently  has a lot of stress in her life now and this is reflective of her elevated BP today.    Asthma/Respiratory Symptom History: Breathing seems to be worse when the weather shifted. She has been more hoarse lately with the colder weather. She is doing albuterol 2-3 times daily because she was home during that time and it was easiest. She had taken time off over the holidays. She is still hoarse despite those albuterol treatments.  She has an appointment with Dr. Rowe Clack on January 8th. She was doing voice therapy every 2-3 weeks. She is feeling that this is helping. She is going to discuss that on Januay 8th with Dr. Rowe Clack how long this needs to continue. ACT is 20 indicating excellent asthma control.   She remains on her CPAP which is managed by Dr. Verlee Monte. She feels that this is helping a lot with her fatigue.   Allergic Rhinitis Symptom History: She remains on the Astelin 1-2 times daily. She has been on the nasal saline gel to provide moisturization to her  nasal cavities. She has not been on antibiotics for any sinopulmonary infections. She alternates antihistamines every couple of months.   GERD Symptom History: She is wondering whether there is a reflux medication that is a liquid form.  We had stopped taking the famotidine with the omeprazole at the last visit, instead doing the omeprazole twice daily. She did have a stretching of her esophagus in the past.   Otherwise, there have been no changes to her past medical history, surgical history, family history, or social history.    Review of Systems  Constitutional:  Positive for malaise/fatigue. Negative for chills, fever and weight loss.  HENT: Negative.  Negative for congestion, ear discharge and ear pain.        Positive for postnasal drip.  Eyes:  Negative for pain, discharge and redness.  Respiratory:  Positive for cough. Negative for sputum production, shortness of breath and wheezing.        Positive for hoarseness.   Cardiovascular:  Negative.  Negative for chest pain and palpitations.  Gastrointestinal:  Negative for abdominal pain, constipation, diarrhea, heartburn, nausea and vomiting.  Skin: Negative.  Negative for itching and rash.  Neurological:  Negative for dizziness and headaches.  Endo/Heme/Allergies:  Positive for environmental allergies. Does not bruise/bleed easily.       Objective:   Blood pressure (!) 140/98, pulse 84, temperature 98.2 F (36.8 C), temperature source Temporal, resp. rate 16, height 5' 1.25" (1.556 m), weight 186 lb 8 oz (84.6 kg), last menstrual period 05/19/2015, SpO2 100 %. Body mass index is 34.95 kg/m.    Physical Exam Vitals reviewed.  Constitutional:      Appearance: Normal appearance. She is well-developed.  HENT:     Head: Normocephalic and atraumatic.     Right Ear: Tympanic membrane, ear canal and external ear normal.     Left Ear: Tympanic membrane, ear canal and external ear normal.     Nose: No nasal deformity, septal deviation, mucosal edema or rhinorrhea.     Right Turbinates: Enlarged, swollen and  pale.     Left Turbinates: Enlarged, swollen and pale.     Right Sinus: No maxillary sinus tenderness or frontal sinus tenderness.     Left Sinus: No maxillary sinus tenderness or frontal sinus tenderness.     Comments: No nasal polyps noted.     Mouth/Throat:     Lips: Pink.     Mouth: Mucous membranes are moist. Mucous membranes are not pale and not dry.     Pharynx: Uvula midline. Posterior oropharyngeal erythema present. No pharyngeal swelling or oropharyngeal exudate.     Comments: Cobblestoning present in the posterior oropharynx.  Eyes:     General: Lids are normal. Allergic shiner present.        Right eye: No discharge.        Left eye: No discharge.     Conjunctiva/sclera: Conjunctivae normal.     Right eye: Right conjunctiva is not injected. No chemosis.    Left eye: Left conjunctiva is not injected. No chemosis.    Pupils: Pupils are equal, round,  and reactive to light.  Cardiovascular:     Rate and Rhythm: Normal rate and regular rhythm.     Heart sounds: Normal heart sounds.  Pulmonary:     Effort: Pulmonary effort is normal. No tachypnea, accessory muscle usage or respiratory distress.     Breath sounds: Normal breath sounds. No wheezing, rhonchi or rales.     Comments: Decreased air movement in the bases. Hoarse voice.  Chest:     Chest wall: No tenderness.  Lymphadenopathy:     Cervical: No cervical adenopathy.  Skin:    Coloration: Skin is not pale.     Findings: No abrasion, erythema, petechiae or rash. Rash is not papular, urticarial or vesicular.  Neurological:     Mental Status: She is alert.  Psychiatric:        Behavior: Behavior is cooperative.      Diagnostic studies: none       Salvatore Marvel, MD  [facility]

## 2022-09-24 MED ORDER — FAMOTIDINE 40 MG/5ML PO SUSR: 40.0000 mg | Freq: Two times a day (BID) | ORAL | 0 refills | Status: DC

## 2022-10-02 ENCOUNTER — Encounter: Payer: Self-pay | Admitting: Allergy & Immunology

## 2022-10-04 ENCOUNTER — Ambulatory Visit (INDEPENDENT_AMBULATORY_CARE_PROVIDER_SITE_OTHER): Payer: 59

## 2022-10-04 DIAGNOSIS — J4540 Moderate persistent asthma, uncomplicated: Secondary | ICD-10-CM | POA: Diagnosis not present

## 2022-10-16 ENCOUNTER — Encounter: Payer: Self-pay | Admitting: Physician Assistant

## 2022-10-18 ENCOUNTER — Ambulatory Visit (INDEPENDENT_AMBULATORY_CARE_PROVIDER_SITE_OTHER): Payer: 59

## 2022-10-18 DIAGNOSIS — J4540 Moderate persistent asthma, uncomplicated: Secondary | ICD-10-CM | POA: Diagnosis not present

## 2022-10-19 ENCOUNTER — Other Ambulatory Visit: Payer: Self-pay | Admitting: Allergy & Immunology

## 2022-10-25 ENCOUNTER — Inpatient Hospital Stay: Payer: 59 | Attending: Hematology and Oncology | Admitting: Hematology and Oncology

## 2022-10-25 ENCOUNTER — Other Ambulatory Visit: Payer: Self-pay

## 2022-10-25 ENCOUNTER — Encounter: Payer: Self-pay | Admitting: Hematology and Oncology

## 2022-10-25 ENCOUNTER — Inpatient Hospital Stay: Payer: 59

## 2022-10-25 VITALS — BP 145/85 | HR 78 | Temp 97.5°F | Resp 14 | Wt 185.5 lb

## 2022-10-25 DIAGNOSIS — M069 Rheumatoid arthritis, unspecified: Secondary | ICD-10-CM | POA: Insufficient documentation

## 2022-10-25 DIAGNOSIS — D472 Monoclonal gammopathy: Secondary | ICD-10-CM | POA: Diagnosis not present

## 2022-10-25 DIAGNOSIS — Z800 Family history of malignant neoplasm of digestive organs: Secondary | ICD-10-CM | POA: Diagnosis not present

## 2022-10-25 DIAGNOSIS — D509 Iron deficiency anemia, unspecified: Secondary | ICD-10-CM | POA: Insufficient documentation

## 2022-10-25 DIAGNOSIS — J45909 Unspecified asthma, uncomplicated: Secondary | ICD-10-CM | POA: Insufficient documentation

## 2022-10-25 LAB — CBC WITH DIFFERENTIAL/PLATELET
Abs Immature Granulocytes: 0 10*3/uL (ref 0.00–0.07)
Basophils Absolute: 0 10*3/uL (ref 0.0–0.1)
Basophils Relative: 1 %
Eosinophils Absolute: 0.1 10*3/uL (ref 0.0–0.5)
Eosinophils Relative: 4 %
HCT: 38.3 % (ref 36.0–46.0)
Hemoglobin: 13.1 g/dL (ref 12.0–15.0)
Immature Granulocytes: 0 %
Lymphocytes Relative: 49 %
Lymphs Abs: 1.4 10*3/uL (ref 0.7–4.0)
MCH: 29.1 pg (ref 26.0–34.0)
MCHC: 34.2 g/dL (ref 30.0–36.0)
MCV: 85.1 fL (ref 80.0–100.0)
Monocytes Absolute: 0.2 10*3/uL (ref 0.1–1.0)
Monocytes Relative: 8 %
Neutro Abs: 1.1 10*3/uL — ABNORMAL LOW (ref 1.7–7.7)
Neutrophils Relative %: 38 %
Platelets: 225 10*3/uL (ref 150–400)
RBC: 4.5 MIL/uL (ref 3.87–5.11)
RDW: 11.8 % (ref 11.5–15.5)
WBC: 2.9 10*3/uL — ABNORMAL LOW (ref 4.0–10.5)
nRBC: 0 % (ref 0.0–0.2)

## 2022-10-25 LAB — COMPREHENSIVE METABOLIC PANEL
ALT: 17 U/L (ref 0–44)
AST: 15 U/L (ref 15–41)
Albumin: 4 g/dL (ref 3.5–5.0)
Alkaline Phosphatase: 78 U/L (ref 38–126)
Anion gap: 6 (ref 5–15)
BUN: 10 mg/dL (ref 6–20)
CO2: 29 mmol/L (ref 22–32)
Calcium: 9.2 mg/dL (ref 8.9–10.3)
Chloride: 105 mmol/L (ref 98–111)
Creatinine, Ser: 0.59 mg/dL (ref 0.44–1.00)
GFR, Estimated: >60 mL/min (ref >60)
Glucose, Bld: 99 mg/dL (ref 70–99)
Potassium: 4.2 mmol/L (ref 3.5–5.1)
Sodium: 140 mmol/L (ref 135–145)
Total Bilirubin: 0.4 mg/dL (ref 0.3–1.2)
Total Protein: 6.7 g/dL (ref 6.5–8.1)

## 2022-10-25 NOTE — Progress Notes (Signed)
[name] CONSULT NOTE  Patient Care Team: Lin Landsman, MD as PCP - General (Family Medicine)  CHIEF COMPLAINTS/PURPOSE OF CONSULTATION:  Abnormal SPEP  ASSESSMENT & PLAN:    This is a pleasant 57 year old female patient with a past medical history significant for rheumatoid arthritis, asthma, iron deficiency anemia referred to hematology for evaluation of abnormal labs. 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   She is here for follow-up.  Since last visit no new health concerns. Physical examination unremarkable, no palpable lymphadenopathy or hepatosplenomegaly. Labs from today pending. 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 I believe the dry eyes is likely from possibly sicca syndrome or Sjogren syndrome especially given her autoimmune predisposition and diagnosis of rheumatoid arthritis.  She should consult with her rheumatologist about recommendations for treatment.  Benay Pike MD   HISTORY OF PRESENTING ILLNESS:   Julie [name] 57 y.o. female is here because of abnormal SPEP.  This is a very pleasant 57 year old female patient with past medical history significant for rheumatoid arthritis, asthma, iron deficiency anemia referred to hematology for evaluation of follow up on low risk MGUS.  She is here for follow up. She is now on some new BP medication. Otherwise, she is without any B symptoms. No new bone pains. She is now on MTX, plaquenil, folic acid and Orencia for RA.  Rest of the pertinent 10 point ROS reviewed and negative.  MEDICAL HISTORY:  Past Medical History:  Diagnosis Date   Anemia    Asthma    Complication of anesthesia    Eczema    Esophageal dysphagia    GERD (gastroesophageal reflux disease)    PONV (postoperative nausea and vomiting)     Rheumatoid arthritis ([place])     SURGICAL HISTORY: Past Surgical History:  Procedure Laterality Date   ABDOMINAL HYSTERECTOMY     partial age 77   BILATERAL SALPINGECTOMY Bilateral 05/25/2015   Procedure: BILATERAL SALPINGECTOMY;  Surgeon: Servando Salina, MD;  Location: [facility];  Service: Gynecology;  Laterality: Bilateral;   CARPAL TUNNEL RELEASE Left 01/05/2020   Procedure: LEFT CARPAL TUNNEL RELEASE;  Surgeon: Daryll Brod, MD;  Location: [facility];  Service: Orthopedics;  Laterality: Left;  FOREARM BIER BLOCK   COLONOSCOPY     OVARIAN CYST REMOVAL Right 05/25/2015   Procedure: OVARIAN CYSTECTOMY;  Surgeon: Servando Salina, MD;  Location: [facility];  Service: Gynecology;  Laterality: Right;   TONSILLECTOMY     TUBAL LIGATION      SOCIAL HISTORY: Social History   Socioeconomic History   Marital status: Married    Spouse name: Not on file   Number of children: Not on file   Years of education: Not on file   Highest education level: Not on file  Occupational History   Not on file  Tobacco Use   Smoking status: Never    Passive exposure: Never   Smokeless tobacco: Never  Vaping Use   Vaping Use: Never used  Substance and Sexual Activity   Alcohol use: No   Drug use: No   Sexual activity: Yes  Other Topics Concern   Not on file  Social History Narrative   Right handed    Caffeine- 2 cups per day    Social Determinants of Health   Financial Resource Strain:  Not on file  Food Insecurity: Not on file  Transportation Needs: Not on file  Physical Activity: Not on file  Stress: Not on file  Social Connections: Not on file  Intimate Partner Violence: Not on file    FAMILY HISTORY: Family History  Problem Relation Age of Onset   Asthma Mother    Eczema Mother    Sarcoidosis Mother    Hypertension Mother    Cancer Father    Hypotension Father    Congestive Heart Failure Father    Colon cancer Father    Multiple myeloma Father    Asthma Sister     GER disease Sister    Lung cancer Maternal Grandfather        smoked   Allergic rhinitis Neg Hx    Angioedema Neg Hx    Immunodeficiency Neg Hx    Urticaria Neg Hx    Esophageal cancer Neg Hx    Rectal cancer Neg Hx    Stomach cancer Neg Hx     ALLERGIES:  is allergic to peanut-containing drug products, aspirin, and augmentin [amoxicillin-pot clavulanate].  MEDICATIONS:  Current Outpatient Medications  Medication Sig Dispense Refill   albuterol (PROVENTIL) (2.5 MG/3ML) 0.083% nebulizer solution Take 3 mLs (2.5 mg total) by nebulization every 4 (four) hours as needed for wheezing or shortness of breath. 150 mL 2   albuterol (VENTOLIN HFA) 108 (90 Base) MCG/ACT inhaler Inhale 2 puffs into the lungs every 6 (six) hours as needed for wheezing or shortness of breath. 18 g 1   azelastine (ASTELIN) 0.1 % nasal spray Place 1 spray into both nostrils 2 (two) times daily. Use in each nostril as directed 30 mL 5   B-D TB SYRINGE 1CC/27GX1/2" 27G X 1/2" 1 ML MISC Use to inject Methotrexate weekly. 12 each 3   cetirizine (ZYRTEC) 10 MG tablet Take 1 tablet (10 mg total) by mouth in the morning. TAKE 1 TABLET EVERY DAY 30 tablet 5   diclofenac Sodium (VOLTAREN) 1 % GEL Apply 2g to 4g to affected area up to 4 times daily as needed. 400 g 4   DUPIXENT 300 MG/2ML prefilled syringe INJECT '300MG'$  SUBCUTANEOUSLY EVERY OTHER WEEK 4 mL 11   EPINEPHrine 0.3 mg/0.3 mL IJ SOAJ injection Inject 0.3 mg into the muscle as needed for anaphylaxis. 2 each 2   famotidine (PEPCID) 40 MG/5ML suspension GIVE 5 MLS BY MOUTH TWICE A DAY 900 mL 1   fluticasone (FLONASE) 50 MCG/ACT nasal spray Place 1 spray into both nostrils daily. 16 g 6   folic acid (FOLVITE) 1 MG tablet TAKE 1 TABLET BY MOUTH TWICE A DAY 60 tablet 11   hydroxychloroquine (PLAQUENIL) 200 MG tablet TAKE 1 TABLET TWICE A DAY MONDAY -FRIDAY 40 tablet 2   levocetirizine (XYZAL) 5 MG tablet Take 1 tablet (5 mg total) by mouth at bedtime. 30 tablet 5    Lifitegrast (XIIDRA) 5 % SOLN Place 1 drop into both eyes 2 times daily.     Methotrexate, PF, (RASUVO) 15 MG/0.3ML SOAJ Inject 15 mg into the skin once a week. 3.6 mL 0   montelukast (SINGULAIR) 10 MG tablet Take 1 tablet (10 mg total) by mouth at bedtime. 30 tablet 5   Multiple Vitamins-Minerals (DAILY MULTIVITAMIN PO) Take 1 tablet by mouth daily.      mupirocin ointment (BACTROBAN) 2 % Apply 1 Application topically 2 (two) times daily. 22 g 3   omeprazole (PRILOSEC) 40 MG capsule Take 1 capsule (40 mg total) by mouth  in the morning and at bedtime. 60 capsule 5   ORENCIA CLICKJECT 142 MG/ML SOAJ INJECT '125MG'$  SUBCUTANEOUSLY ONCE WEEKLY 4 mL 2   Current Facility-Administered Medications  Medication Dose Route Frequency Provider Last Rate Last Admin   Dupilumab SOSY 300 mg  300 mg Subcutaneous Q14 Days Dara Hoyer, FNP   300 mg at 10/18/22 1741    PHYSICAL EXAMINATION: ECOG PERFORMANCE STATUS: 0 - Asymptomatic  Vitals:   10/25/22 0902  BP: (!) 145/85  Pulse: 78  Resp: 14  Temp: (!) 97.5 F (36.4 C)  SpO2: 100%   Filed Weights   10/25/22 0902  Weight: 185 lb 8 oz (84.1 kg)   Physical Exam Constitutional:      Appearance: Normal appearance.  HENT:     Head: Normocephalic and atraumatic.  Cardiovascular:     Rate and Rhythm: Normal rate and regular rhythm.  Pulmonary:     Effort: Pulmonary effort is normal.     Breath sounds: Normal breath sounds.  Abdominal:     General: Abdomen is flat.     Palpations: Abdomen is soft.  Musculoskeletal:        General: No swelling or tenderness.  Skin:    General: Skin is warm and dry.  Neurological:     General: No focal deficit present.     Mental Status: She is alert.  Psychiatric:        Mood and Affect: Mood normal.        Behavior: Behavior normal.      LABORATORY DATA:  I have reviewed the data as listed Lab Results  Component Value Date   WBC 4.1 09/05/2022   HGB 12.1 09/05/2022   HCT 36.3 09/05/2022   MCV 85.2  09/05/2022   PLT 240 09/05/2022     Chemistry      Component Value Date/Time   NA 141 09/05/2022 0847   NA 140 10/02/2016 1602   K 4.6 09/05/2022 0847   CL 105 09/05/2022 0847   CO2 31 09/05/2022 0847   BUN 13 09/05/2022 0847   BUN 8 10/02/2016 1602   CREATININE 0.57 09/05/2022 0847      Component Value Date/Time   CALCIUM 9.4 09/05/2022 0847   ALKPHOS 79 04/24/2022 0901   AST 17 09/05/2022 0847   AST 14 (L) 04/24/2022 0901   ALT 25 09/05/2022 0847   ALT 13 04/24/2022 0901   BILITOT 0.3 09/05/2022 0847   BILITOT 0.4 04/24/2022 0901     Labs from February 2023 with an M spike of 0.6 g/dL normal CMP kappa lambda ratio of 2.56.  No evidence of immunoparesis Most recent CBC from June with no anemia, mild leukopenia and neutropenia noted. CMP with no evidence of AKI  Labs from today pending.  RADIOGRAPHIC STUDIES: I have personally reviewed the radiological images as listed and agreed with the findings in the report. No results found.    I spent 20 minutes in the care of this patient including history and physical, review of records, discussion about MGUS and surveillance recommendations.    Benay Pike, MD 10/25/2022 9:11 AM

## 2022-10-26 LAB — IGG, IGA, IGM
IgA: 157 mg/dL (ref 87–352)
IgG (Immunoglobin G), Serum: 1286 mg/dL (ref 586–1602)
IgM (Immunoglobulin M), Srm: 88 mg/dL (ref 26–217)

## 2022-10-26 LAB — KAPPA/LAMBDA LIGHT CHAINS
Kappa free light chain: 22.8 mg/L — ABNORMAL HIGH (ref 3.3–19.4)
Kappa, lambda light chain ratio: 2.51 — ABNORMAL HIGH (ref 0.26–1.65)
Lambda free light chains: 9.1 mg/L (ref 5.7–26.3)

## 2022-10-30 ENCOUNTER — Other Ambulatory Visit: Payer: Self-pay | Admitting: Rheumatology

## 2022-10-30 DIAGNOSIS — M0579 Rheumatoid arthritis with rheumatoid factor of multiple sites without organ or systems involvement: Secondary | ICD-10-CM

## 2022-10-30 NOTE — Telephone Encounter (Signed)
Next Visit: 12/06/2022  Last Visit: 09/05/2022  Last Fill: 08/02/2022  [id number] arthritis involving multiple sites with positive rheumatoid factor   Current Dose per office note 09/05/2022: Orencia 125 mg sq injections once weekly   Labs: 10/25/2022 Glucose 100, WBC 2.9, Neutro Abs 1.1  TB Gold: 09/05/2022 Neg    Okay to refill Orencia?

## 2022-11-01 ENCOUNTER — Ambulatory Visit (INDEPENDENT_AMBULATORY_CARE_PROVIDER_SITE_OTHER): Payer: 59

## 2022-11-01 DIAGNOSIS — J4540 Moderate persistent asthma, uncomplicated: Secondary | ICD-10-CM

## 2022-11-02 ENCOUNTER — Telehealth: Payer: Self-pay | Admitting: Pharmacist

## 2022-11-02 LAB — PROTEIN ELECTROPHORESIS, SERUM, WITH REFLEX
A/G Ratio: 1.3 (ref 0.7–1.7)
Albumin ELP: 3.9 g/dL (ref 2.9–4.4)
Alpha-1-Globulin: 0.2 g/dL (ref 0.0–0.4)
Alpha-2-Globulin: 0.7 g/dL (ref 0.4–1.0)
Beta Globulin: 0.9 g/dL (ref 0.7–1.3)
Gamma Globulin: 1.2 g/dL (ref 0.4–1.8)
Globulin, Total: 3 g/dL (ref 2.2–3.9)
M-Spike, %: 0.4 g/dL — ABNORMAL HIGH
SPEP Interpretation: 0
Total Protein ELP: 6.9 g/dL (ref 6.0–8.5)

## 2022-11-02 LAB — IMMUNOFIXATION REFLEX, SERUM
IgA: 163 mg/dL (ref 87–352)
IgG (Immunoglobin G), Serum: 1333 mg/dL (ref 586–1602)
IgM (Immunoglobulin M), Srm: 94 mg/dL (ref 26–217)

## 2022-11-02 NOTE — Telephone Encounter (Signed)
Submitted a Prior Authorization RENEWAL request to [facility] for [facility] via CoverMyMeds. Will update once we receive a response.  Key: Juventino Slovak, PharmD, MPH, BCPS, CPP Clinical Pharmacist (Rheumatology and Pulmonology)

## 2022-11-05 ENCOUNTER — Ambulatory Visit: Payer: 59 | Admitting: Hematology and Oncology

## 2022-11-05 ENCOUNTER — Other Ambulatory Visit: Payer: 59

## 2022-11-06 ENCOUNTER — Other Ambulatory Visit: Payer: Self-pay | Admitting: Physician Assistant

## 2022-11-06 NOTE — Telephone Encounter (Signed)
Next Visit: 12/06/2022   Last Visit: 09/05/2022   Last Fill: 08/14/2022  [id number] arthritis involving multiple sites with positive rheumatoid factor    Current Dose per office note 09/05/2022: Rasuvo 15 mg sq injections once weekly   Labs: 10/25/2022 WBC 2.9, Neutro Abs 1.1  Okay to refill Rasuvo?

## 2022-11-07 NOTE — Telephone Encounter (Signed)
Received notification from [facility] regarding a prior authorization for [facility]. Authorization has been APPROVED from 11/02/2022 to 11/03/2023. Approval letter sent to scan center.  Patient must continue to fill through [facility]: [phone]   Authorization # [id number]  Knox Saliva, PharmD, MPH, BCPS, CPP Clinical Pharmacist (Rheumatology and Pulmonology)

## 2022-11-16 ENCOUNTER — Ambulatory Visit (INDEPENDENT_AMBULATORY_CARE_PROVIDER_SITE_OTHER): Payer: 59 | Admitting: *Deleted

## 2022-11-16 DIAGNOSIS — J4540 Moderate persistent asthma, uncomplicated: Secondary | ICD-10-CM | POA: Diagnosis not present

## 2022-11-27 NOTE — Progress Notes (Unsigned)
Office Visit Note  Patient: Julie [name]             Date of Birth: 07-Dec-1965           MRN: [id number]             PCP: Lin Landsman, MD Referring: Lin Landsman, MD Visit Date: 12/06/2022 Occupation: @GUAROCC @  Subjective:  Medication monitoring   History of Present Illness: Julie [name] is a 57 y.o. female with history of seropositive rheumatoid arthritis and fibromyalgia.  Patient remains on Orencia 125 mg sq injections once weekly, Rasuvo 15 mg sq inj weekly, Plaquenil 200 mg 1 tablet BID Monday-Friday.  She is tolerating combination therapy without any side effects.  She has not missed any doses recently.  She is scheduled for her next Plaquenil eye examination on 12/21/2022.  Patient states she is been having intermittent discomfort in the left wrist.  She attributes her recent symptoms due to weather changes as well as having red meat twice last week.  She is experiencing less tenderness and stiffness this morning.  She continues to experience interval myalgias and muscle tenderness due to fibromyalgia.  She has been trying to have a massage twice a month which has been helpful. Patient reports that she has been following up closely with her PCP for control of her blood pressure.  Her blood pressure was 103/67 today in the office. She has not had any recurrent infections recently.   Activities of Daily Living:  Patient reports morning stiffness for 0  none .   Patient Denies nocturnal pain.  Difficulty dressing/grooming: Denies Difficulty climbing stairs: Denies Difficulty getting out of chair: Denies Difficulty using hands for taps, buttons, cutlery, and/or writing: Denies  Review of Systems  Constitutional:  Positive for fatigue.  HENT:  Positive for mouth dryness. Negative for mouth sores and nose dryness.   Eyes:  Positive for dryness. Negative for pain and visual disturbance.  Respiratory:  Negative for cough, hemoptysis, shortness of breath and difficulty  breathing.   Cardiovascular:  Negative for chest pain, palpitations, hypertension and swelling in legs/feet.  Gastrointestinal:  Negative for blood in stool, constipation and diarrhea.  Endocrine: Negative for increased urination.  Genitourinary:  Negative for painful urination and involuntary urination.  Musculoskeletal:  Positive for joint pain, joint pain, joint swelling, myalgias and myalgias. Negative for gait problem, muscle weakness, morning stiffness and muscle tenderness.  Skin:  Positive for rash. Negative for color change, pallor, hair loss, nodules/bumps, skin tightness, ulcers and sensitivity to sunlight.  Allergic/Immunologic: Negative for susceptible to infections.  Neurological:  Positive for headaches. Negative for dizziness, numbness and weakness.  Hematological:  Negative for swollen glands.  Psychiatric/Behavioral:  Positive for sleep disturbance. Negative for depressed mood. The patient is not nervous/anxious.     PMFS History:  Patient Active Problem List   Diagnosis Date Noted   Witnessed episode of apnea 01/07/2022   Viral illness 09/08/2021   Anaphylactic shock due to adverse food reaction 02/01/2020   Moderate persistent asthma without complication [id number]   Complication of anesthesia    Fibromyalgia 02/14/2018   History of gastroesophageal reflux (GERD) 02/14/2018   History of asthma 02/14/2018   Rheumatoid arthritis involving multiple sites with positive rheumatoid factor ([place]) 11/05/2016   High risk medication use 11/05/2016   LPRD (laryngopharyngeal reflux disease) 09/20/2015   Seasonal and perennial allergic rhinoconjunctivitis 09/20/2015   Acute sinusitis 07/29/2015   S/P total hysterectomy 05/25/2015    Past Medical History:  Diagnosis  Date   Anemia    Asthma    Complication of anesthesia    Eczema    Esophageal dysphagia    GERD (gastroesophageal reflux disease)    Hypertension    PONV (postoperative nausea and vomiting)    Rheumatoid  arthritis ([place])     Family History  Problem Relation Age of Onset   Asthma Mother    Eczema Mother    Sarcoidosis Mother    Hypertension Mother    Cancer Father    Hypotension Father    Congestive Heart Failure Father    Colon cancer Father    Multiple myeloma Father    Asthma Sister    GER disease Sister    Lung cancer Maternal Grandfather        smoked   Allergic rhinitis Neg Hx    Angioedema Neg Hx    Immunodeficiency Neg Hx    Urticaria Neg Hx    Esophageal cancer Neg Hx    Rectal cancer Neg Hx    Stomach cancer Neg Hx    Past Surgical History:  Procedure Laterality Date   ABDOMINAL HYSTERECTOMY     partial age 36   BILATERAL SALPINGECTOMY Bilateral 05/25/2015   Procedure: BILATERAL SALPINGECTOMY;  Surgeon: Servando Salina, MD;  Location: [facility];  Service: Gynecology;  Laterality: Bilateral;   CARPAL TUNNEL RELEASE Left 01/05/2020   Procedure: LEFT CARPAL TUNNEL RELEASE;  Surgeon: Daryll Brod, MD;  Location: [facility];  Service: Orthopedics;  Laterality: Left;  FOREARM BIER BLOCK   COLONOSCOPY     OVARIAN CYST REMOVAL Right 05/25/2015   Procedure: OVARIAN CYSTECTOMY;  Surgeon: Servando Salina, MD;  Location: [facility];  Service: Gynecology;  Laterality: Right;   TONSILLECTOMY     TUBAL LIGATION     Social History   Social History Narrative   Right handed    Caffeine- 2 cups per day    Immunization History  Administered Date(s) Administered   Influenza Inj Mdck Quad Pf 10/19/2017   Influenza Split 11/25/2014   Influenza, Quadrivalent, Recombinant, Inj, Pf 08/26/2019   Influenza, Seasonal, Injecte, Preservative Fre 09/20/2015   Influenza,inj,Quad PF,6+ Mos 08/04/2018   Influenza-Unspecified 07/18/2017   PFIZER(Purple Top)SARS-COV-2 Vaccination 11/28/2019, 12/19/2019, 06/23/2020     Objective: Vital Signs: BP 103/67 (BP Location: Left Arm, Patient Position: Sitting, Cuff Size: Normal)   Pulse 78   Resp 15   Ht 5' 1.25" (1.556 m)   Wt 186 lb  (84.4 kg)   LMP 05/19/2015   BMI 34.86 kg/m    Physical Exam Vitals and nursing note reviewed.  Constitutional:      Appearance: She is well-developed.  HENT:     Head: Normocephalic and atraumatic.  Eyes:     Conjunctiva/sclera: Conjunctivae normal.  Cardiovascular:     Rate and Rhythm: Normal rate and regular rhythm.     Heart sounds: Normal heart sounds.  Pulmonary:     Effort: Pulmonary effort is normal.     Breath sounds: Normal breath sounds.  Abdominal:     General: Bowel sounds are normal.     Palpations: Abdomen is soft.  Musculoskeletal:     Cervical back: Normal range of motion.  Lymphadenopathy:     Cervical: No cervical adenopathy.  Skin:    General: Skin is warm and dry.     Capillary Refill: Capillary refill takes less than 2 seconds.  Neurological:     Mental Status: She is alert and oriented to person, place, and time.  Psychiatric:        Behavior: Behavior normal.      Musculoskeletal Exam: C-spine, thoracic spine, and lumbar spine good ROM.  Shoulder joints, elbow joints, wrist joints, MCPs, PIPs, and DIPs good ROM with no synovitis.  Tenderness over the right first MCP and left wrist joint.  Complete fist formation bilaterally.  Hip joints have good ROM.  Knee joints have good ROM with no warmth or effusion. Ankle joints have good ROM with no tenderness or joint swelling.   CDAI Exam: CDAI Score: 2.2  Patient Global: 1 mm; Provider Global: 1 mm Swollen: 0 ; Tender: 2  Joint Exam 12/06/2022      Right  Left  Wrist      Tender  MCP 1   Tender        Investigation: No additional findings.  Imaging: No results found.  Recent Labs: Lab Results  Component Value Date   WBC 2.9 (L) 10/25/2022   HGB 13.1 10/25/2022   PLT 225 10/25/2022   NA 140 10/25/2022   K 4.2 10/25/2022   CL 105 10/25/2022   CO2 29 10/25/2022   GLUCOSE 99 10/25/2022   BUN 10 10/25/2022   CREATININE 0.59 10/25/2022   BILITOT 0.4 10/25/2022   ALKPHOS 78 10/25/2022    AST 15 10/25/2022   ALT 17 10/25/2022   PROT 6.7 10/25/2022   ALBUMIN 4.0 10/25/2022   CALCIUM 9.2 10/25/2022   GFRAA 118 03/16/2021   QFTBGOLDPLUS NEGATIVE 09/05/2022    Speciality Comments: PLQ Eye Exam: 12/08/2021  WNL at [facility] Follow up in 1 year  Procedures:  No procedures performed Allergies: Peanut-containing drug products, Aspirin, and Augmentin [amoxicillin-pot clavulanate]   Assessment / Plan:     Visit Diagnoses: Rheumatoid arthritis involving multiple sites with positive rheumatoid factor ([place]): She has no synovitis on examination today.  She has been experiencing intermittent discomfort and stiffness in the left wrist that have no active inflammation currently.  Overall her rheumatoid arthritis remains well-controlled on triple therapy: Orencia 125 mg sq injections once weekly, Rasuvo 15 mg sq injections once weekly, and Plaquenil 200 mg 1 tablet by mouth twice daily Monday through Friday.  She is tolerating combination therapy without any side effects and has not missed any doses recently.  She has not had any recurrent infections.  No medication changes will be made at this time.  She plans on trying to follow a more anti-inflammatory diet.  Discussed trying to follow a Mediterranean style diet if possible. She was advised to notify us if she develops signs or symptoms of a flare.  She will follow-up in the office in 3 months or sooner if needed.  High risk medication use - Orencia 125 mg sq injections once weekly, Rasuvo 15 mg sq inj weekly,Plaquenil 200 mg 1 tablet BID Monday-Friday.  CBC and CMP updated on 10/25/22. Her next lab work will be due in May and every 3 months. TB gold negative 09/05/22.   Discussed the importance of holding orencia and rasuvo if she develops signs or symptoms of an infection and to resume once the infection has completely cleared.  PLQ Eye Exam: 12/08/2021 WNL at [facility] Follow up in 1 year. Her next eye exam is scheduled on 12/21/22.   -  Plan: CBC with Differential/Platelet, COMPLETE METABOLIC PANEL WITH GFR  Trigger middle finger of right hand: Asymptomatic at this time.    Fibromyalgia: She experiences intermittent myalgias and muscle tenderness due to fibromyalgia.  Discussed the importance  of regular exercise and good sleep hygiene.  Positive ANA (antinuclear antibody) - 07/13/20: ANA is positive-low, nonspecific titer. RNP remains positive. Complements WNL. dsDNA negative.  No clinical features of systemic lupus at this time.  Anti-RNP antibodies present  Sicca syndrome (HCC) - [place] antibodies negative: Chronic.  Using Xiidra eyedrops for dry eyes.  Osteopenia of spine - Previous DEXA 08/24/2019: AP spine BMD 0.909 with T score -1.3.   DEXA updated on 09/12/22: AP spine BMD 0.808 with T-score -2.2.  Statistically significant decrease in BMD AP spine.  Bilateral hip stable.  Test results were reviewed with the patient today in the office and all questions were addressed.  No recent falls or fractures. Discussed the importance of taking a calcium and vitamin D supplement daily.  I also discussed the importance of performing resistive exercises.  A future order for vitamin D will be placed today to be drawn with her next lab work.  Vitamin D deficiency - A future order for vitamin D placed today. Plan: VITAMIN D 25 Hydroxy (Vit-D Deficiency, Fractures)  Other medical conditions are listed as follows:   History of Helicobacter pylori infection  History of asthma  History of gastroesophageal reflux (GERD)  Abnormal SPEP  History of depression    Orders: Orders Placed This Encounter  Procedures   VITAMIN D 25 Hydroxy (Vit-D Deficiency, Fractures)   CBC with Differential/Platelet   COMPLETE METABOLIC PANEL WITH GFR   No orders of the defined types were placed in this encounter.   Follow-Up Instructions: Return in about 5 months (around 05/08/2023) for Rheumatoid arthritis, Fibromyalgia.   Ofilia Neas,  PA-C  Note - This record has been created using Dragon software.  Chart creation errors have been sought, but may not always  have been located. Such creation errors do not reflect on  the standard of medical care.

## 2022-12-05 ENCOUNTER — Ambulatory Visit (INDEPENDENT_AMBULATORY_CARE_PROVIDER_SITE_OTHER): Payer: 59

## 2022-12-05 DIAGNOSIS — J4540 Moderate persistent asthma, uncomplicated: Secondary | ICD-10-CM

## 2022-12-06 ENCOUNTER — Encounter: Payer: Self-pay | Admitting: Physician Assistant

## 2022-12-06 ENCOUNTER — Ambulatory Visit: Payer: 59 | Attending: Physician Assistant | Admitting: Physician Assistant

## 2022-12-06 VITALS — BP 103/67 | HR 78 | Resp 15 | Ht 61.25 in | Wt 186.0 lb

## 2022-12-06 DIAGNOSIS — Z79899 Other long term (current) drug therapy: Secondary | ICD-10-CM | POA: Diagnosis not present

## 2022-12-06 DIAGNOSIS — M65331 Trigger finger, right middle finger: Secondary | ICD-10-CM

## 2022-12-06 DIAGNOSIS — M797 Fibromyalgia: Secondary | ICD-10-CM

## 2022-12-06 DIAGNOSIS — Z8619 Personal history of other infectious and parasitic diseases: Secondary | ICD-10-CM

## 2022-12-06 DIAGNOSIS — E559 Vitamin D deficiency, unspecified: Secondary | ICD-10-CM

## 2022-12-06 DIAGNOSIS — R768 Other specified abnormal immunological findings in serum: Secondary | ICD-10-CM

## 2022-12-06 DIAGNOSIS — M3500 Sicca syndrome, unspecified: Secondary | ICD-10-CM

## 2022-12-06 DIAGNOSIS — Z8719 Personal history of other diseases of the digestive system: Secondary | ICD-10-CM

## 2022-12-06 DIAGNOSIS — R778 Other specified abnormalities of plasma proteins: Secondary | ICD-10-CM

## 2022-12-06 DIAGNOSIS — M8588 Other specified disorders of bone density and structure, other site: Secondary | ICD-10-CM

## 2022-12-06 DIAGNOSIS — M0579 Rheumatoid arthritis with rheumatoid factor of multiple sites without organ or systems involvement: Secondary | ICD-10-CM

## 2022-12-06 DIAGNOSIS — Z8709 Personal history of other diseases of the respiratory system: Secondary | ICD-10-CM

## 2022-12-06 DIAGNOSIS — Z8659 Personal history of other mental and behavioral disorders: Secondary | ICD-10-CM

## 2022-12-06 NOTE — Patient Instructions (Signed)
Standing Labs We placed an order today for your standing lab work.   Please have your standing labs drawn in May and every 3 months   Please have your labs drawn 2 weeks prior to your appointment so that the provider can discuss your lab results at your appointment, if possible.  Please note that you may see your imaging and lab results in MyChart before we have reviewed them. We will contact you once all results are reviewed. Please allow our office up to 72 hours to thoroughly review all of the results before contacting the office for clarification of your results.  WALK-IN LAB HOURS  Monday through Thursday from 8:00 am -12:30 pm and 1:00 pm-5:00 pm and Friday from 8:00 am-12:00 pm.  Patients with office visits requiring labs will be seen before walk-in labs.  You may encounter longer than normal wait times. Please allow additional time. Wait times may be shorter on  Monday and Thursday afternoons.  We do not book appointments for walk-in labs. We appreciate your patience and understanding with our staff.   Labs are drawn by [facility]. Please bring your co-pay at the time of your lab draw.  You may receive a bill from [facility] for your lab work.  Please note if you are on Hydroxychloroquine and and an order has been placed for a Hydroxychloroquine level,  you will need to have it drawn 4 hours or more after your last dose.  If you wish to have your labs drawn at another location, please call the office 24 hours in advance so we can fax the orders.  The office is located at [address], [address], [place], [place] [address]   If you have any questions regarding directions or hours of operation,  please call [phone].   As a reminder, please drink plenty of water prior to coming for your lab work. Thanks!  

## 2022-12-10 ENCOUNTER — Other Ambulatory Visit: Payer: Self-pay | Admitting: *Deleted

## 2022-12-10 ENCOUNTER — Telehealth: Payer: Self-pay

## 2022-12-10 MED ORDER — PREDNISONE 5 MG PO TABS: ORAL_TABLET | ORAL | 0 refills | Status: DC

## 2022-12-10 NOTE — Telephone Encounter (Signed)
Ok to send in prednisone taper starting at 20 mg tapering by 5 mg every 4 days.  Take prednisone in the morning with food and avoid the use NSAIDs.

## 2022-12-10 NOTE — Telephone Encounter (Signed)
Received fax from [facility] advising they have made several attempts to contact patient to fill her [place].  Called patient - DOB verified - advised of above notation. Patient advised she already has a box in her refrigerator but will call [facility] to verify/confirm their contact information.

## 2022-12-19 ENCOUNTER — Ambulatory Visit (INDEPENDENT_AMBULATORY_CARE_PROVIDER_SITE_OTHER): Payer: 59

## 2022-12-19 DIAGNOSIS — J4540 Moderate persistent asthma, uncomplicated: Secondary | ICD-10-CM | POA: Diagnosis not present

## 2022-12-31 ENCOUNTER — Other Ambulatory Visit: Payer: Self-pay | Admitting: *Deleted

## 2022-12-31 MED ORDER — DUPIXENT 300 MG/2ML ~~LOC~~ SOSY: PREFILLED_SYRINGE | SUBCUTANEOUS | 11 refills | Status: DC

## 2023-01-01 ENCOUNTER — Telehealth: Payer: Self-pay | Admitting: Allergy & Immunology

## 2023-01-01 MED ORDER — OMEPRAZOLE 40 MG PO CPDR: 40.0000 mg | DELAYED_RELEASE_CAPSULE | Freq: Two times a day (BID) | ORAL | 5 refills | Status: DC

## 2023-01-01 NOTE — Telephone Encounter (Signed)
[place] called in and states she needs a new Omeprazole prescription sent to [facility] on [address].  The one they have is no longer vaild.

## 2023-01-02 ENCOUNTER — Ambulatory Visit: Payer: 59

## 2023-01-07 ENCOUNTER — Encounter: Payer: Self-pay | Admitting: Allergy & Immunology

## 2023-01-07 ENCOUNTER — Encounter: Payer: Self-pay | Admitting: Hematology and Oncology

## 2023-01-08 NOTE — Telephone Encounter (Signed)
Please clarify if the patient has a dermatologist?

## 2023-01-08 NOTE — Telephone Encounter (Signed)
This seems like a visit... Any openings with the nurse practitioners??  Malachi Bonds, MD [facility]

## 2023-01-09 ENCOUNTER — Ambulatory Visit (INDEPENDENT_AMBULATORY_CARE_PROVIDER_SITE_OTHER): Payer: 59

## 2023-01-09 DIAGNOSIS — J4540 Moderate persistent asthma, uncomplicated: Secondary | ICD-10-CM

## 2023-01-09 NOTE — Telephone Encounter (Signed)
I would recommend evaluation by dermatology to confirm diagnosis

## 2023-01-11 ENCOUNTER — Other Ambulatory Visit: Payer: Self-pay | Admitting: Family Medicine

## 2023-01-11 DIAGNOSIS — Z1231 Encounter for screening mammogram for malignant neoplasm of breast: Secondary | ICD-10-CM

## 2023-01-20 ENCOUNTER — Other Ambulatory Visit: Payer: Self-pay | Admitting: Physician Assistant

## 2023-01-21 ENCOUNTER — Encounter: Payer: Self-pay | Admitting: *Deleted

## 2023-01-21 ENCOUNTER — Other Ambulatory Visit: Payer: Self-pay | Admitting: Physician Assistant

## 2023-01-21 DIAGNOSIS — M0579 Rheumatoid arthritis with rheumatoid factor of multiple sites without organ or systems involvement: Secondary | ICD-10-CM

## 2023-01-21 NOTE — Telephone Encounter (Signed)
Last Fill: 10/30/2022  Labs: 10/25/2022 WBC 2.9, Neutro Abs 1.1  TB Gold: 09/05/2022 Neg    Next Visit: 04/29/2023   Last Visit: 12/06/2022  DX: Rheumatoid arthritis involving multiple sites with positive rheumatoid factor   Current Dose per office note 12/06/2022: Orencia 125 mg sq injections once weekly   Message sent to patient via my chart to advise she is due to update labs.   Okay to refill Orencia?

## 2023-01-21 NOTE — Telephone Encounter (Signed)
Last Fill: 11/06/2022  Labs:  10/25/2022 WBC 2.9, Neutro Abs 1.1   Next Visit: 04/29/2023   Last Visit: 12/06/2022   DX: Rheumatoid arthritis involving multiple sites with positive rheumatoid factor   Current Dose per office note 12/06/2022: Rasuvo 15 mg sq inj weekly   Message sent to patient via my chart to advise she is due to update labs   Okay to refill Methotrexate?

## 2023-01-23 ENCOUNTER — Ambulatory Visit (INDEPENDENT_AMBULATORY_CARE_PROVIDER_SITE_OTHER): Payer: 59

## 2023-01-23 DIAGNOSIS — J4540 Moderate persistent asthma, uncomplicated: Secondary | ICD-10-CM | POA: Diagnosis not present

## 2023-01-24 ENCOUNTER — Other Ambulatory Visit: Payer: Self-pay | Admitting: Physician Assistant

## 2023-01-24 NOTE — Telephone Encounter (Signed)
Last Fill: 02/05/2022  Next Visit: 04/29/2023  Last Visit: 12/06/2022  Dx: Rheumatoid arthritis involving multiple sites with positive rheumatoid factor   Current Dose per office note on 12/06/2022: not mentioned  Okay to refill Folic Acid?

## 2023-02-07 ENCOUNTER — Ambulatory Visit: Payer: 59

## 2023-02-11 ENCOUNTER — Other Ambulatory Visit: Payer: Self-pay | Admitting: Physician Assistant

## 2023-02-11 DIAGNOSIS — M0579 Rheumatoid arthritis with rheumatoid factor of multiple sites without organ or systems involvement: Secondary | ICD-10-CM

## 2023-02-12 NOTE — Telephone Encounter (Signed)
Last Fill: 01/21/2023  Labs: 10/25/2022 WBC 2.9, Neutro Abs 1.1, I called patient, labs are due  TB Gold: 09/05/2022  TB gold negative   Next Visit: 04/29/2023   Last Visit: 12/06/2022  DX: Rheumatoid arthritis involving multiple sites with positive rheumatoid factor   Current Dose per office note 12/06/2022: Orencia 125 mg sq injections once weekly   Okay to refill Orencia?

## 2023-02-13 ENCOUNTER — Other Ambulatory Visit: Payer: Self-pay | Admitting: *Deleted

## 2023-02-13 ENCOUNTER — Ambulatory Visit (INDEPENDENT_AMBULATORY_CARE_PROVIDER_SITE_OTHER): Payer: 59 | Admitting: *Deleted

## 2023-02-13 DIAGNOSIS — E559 Vitamin D deficiency, unspecified: Secondary | ICD-10-CM

## 2023-02-13 DIAGNOSIS — J4540 Moderate persistent asthma, uncomplicated: Secondary | ICD-10-CM | POA: Diagnosis not present

## 2023-02-13 DIAGNOSIS — Z79899 Other long term (current) drug therapy: Secondary | ICD-10-CM

## 2023-02-13 LAB — CBC WITH DIFFERENTIAL/PLATELET
Absolute Monocytes: 336 cells/uL (ref 200–950)
Hemoglobin: 12.8 g/dL (ref 11.7–15.5)
Lymphs Abs: 1482 cells/uL (ref 850–3900)
MCHC: 33.1 g/dL (ref 32.0–36.0)
Neutro Abs: 4122 cells/uL (ref 1500–7800)
Neutrophils Relative %: 68.7 %
RBC: 4.49 10*6/uL (ref 3.80–5.10)
Total Lymphocyte: 24.7 %
WBC: 6 10*3/uL (ref 3.8–10.8)

## 2023-02-14 ENCOUNTER — Other Ambulatory Visit: Payer: Self-pay | Admitting: *Deleted

## 2023-02-14 DIAGNOSIS — E559 Vitamin D deficiency, unspecified: Secondary | ICD-10-CM

## 2023-02-14 LAB — COMPLETE METABOLIC PANEL WITH GFR
AG Ratio: 1.6 (calc) (ref 1.0–2.5)
ALT: 16 U/L (ref 6–29)
AST: 14 U/L (ref 10–35)
Albumin: 4.7 g/dL (ref 3.6–5.1)
Alkaline phosphatase (APISO): 86 U/L (ref 37–153)
BUN: 22 mg/dL (ref 7–25)
CO2: 28 mmol/L (ref 20–32)
Calcium: 9.4 mg/dL (ref 8.6–10.4)
Chloride: 103 mmol/L (ref 98–110)
Creat: 0.76 mg/dL (ref 0.50–1.03)
Globulin: 2.9 g/dL (calc) (ref 1.9–3.7)
Glucose, Bld: 109 mg/dL — ABNORMAL HIGH (ref 65–99)
Potassium: 5.2 mmol/L (ref 3.5–5.3)
Sodium: 138 mmol/L (ref 135–146)
Total Bilirubin: 0.3 mg/dL (ref 0.2–1.2)
Total Protein: 7.6 g/dL (ref 6.1–8.1)
eGFR: 93 mL/min/{1.73_m2} (ref >=60)

## 2023-02-14 LAB — CBC WITH DIFFERENTIAL/PLATELET
Basophils Absolute: 48 cells/uL (ref 0–200)
Basophils Relative: 0.8 %
Eosinophils Absolute: 12 cells/uL — ABNORMAL LOW (ref 15–500)
Eosinophils Relative: 0.2 %
HCT: 38.7 % (ref 35.0–45.0)
MCH: 28.5 pg (ref 27.0–33.0)
MCV: 86.2 fL (ref 80.0–100.0)
MPV: 10.9 fL (ref 7.5–12.5)
Monocytes Relative: 5.6 %
Platelets: 270 10*3/uL (ref 140–400)
RDW: 12.1 % (ref 11.0–15.0)

## 2023-02-14 LAB — VITAMIN D 25 HYDROXY (VIT D DEFICIENCY, FRACTURES): Vit D, 25-Hydroxy: 18 ng/mL — ABNORMAL LOW (ref 30–100)

## 2023-02-14 MED ORDER — VITAMIN D (ERGOCALCIFEROL) 1.25 MG (50000 UNIT) PO CAPS
50000.0000 [IU] | ORAL_CAPSULE | ORAL | 0 refills | Status: DC
Start: 2023-02-14 — End: 2023-09-24

## 2023-02-14 NOTE — Progress Notes (Signed)
Glucose is 109. Rest of CMP WNL.   Absolute eosinophils are borderline low. Rest of CBC WNL.  We will continue to monitor.  Vitamin D is low-18.  Please notify the patient and send in vitamin D 50,000 units once weekly x3 months.  Recheck vitamin D in 3 months.

## 2023-02-15 ENCOUNTER — Other Ambulatory Visit: Payer: Self-pay | Admitting: Physician Assistant

## 2023-02-15 ENCOUNTER — Other Ambulatory Visit: Payer: Self-pay | Admitting: Allergy & Immunology

## 2023-02-18 NOTE — Telephone Encounter (Signed)
Last Fill: 09/05/2022  Eye exam: 12/21/2022  WNL    Labs: 02/13/2023 Glucose is 109. Rest of CMP WNL.   Absolute eosinophils are borderline low. Rest of CBC WNL.   Next Visit: 04/29/2023  Last Visit: 12/06/2022  DX: Rheumatoid arthritis involving multiple sites with positive rheumatoid factor   Current Dose per office note 12/06/2022: Plaquenil 200 mg 1 tablet BID Monday-Friday   Okay to refill Plaquenil?

## 2023-02-28 ENCOUNTER — Ambulatory Visit (INDEPENDENT_AMBULATORY_CARE_PROVIDER_SITE_OTHER): Payer: 59

## 2023-02-28 DIAGNOSIS — J4540 Moderate persistent asthma, uncomplicated: Secondary | ICD-10-CM | POA: Diagnosis not present

## 2023-03-05 ENCOUNTER — Other Ambulatory Visit: Payer: Self-pay | Admitting: Physician Assistant

## 2023-03-05 DIAGNOSIS — M0579 Rheumatoid arthritis with rheumatoid factor of multiple sites without organ or systems involvement: Secondary | ICD-10-CM

## 2023-03-05 NOTE — Telephone Encounter (Signed)
Last Fill: 02/12/2023 (30 day supply)  Labs: 02/13/2023 Glucose is 109. Rest of CMP WNL.   Absolute eosinophils are borderline low. Rest of CBC WNL.  TB Gold: 09/05/2022 Neg    Next Visit: 04/29/2023  Last Visit: 12/06/2022  [id number] arthritis involving multiple sites with positive rheumatoid factor   Current Dose per office note 12/06/2022: Orencia 125 mg sq injections once weekly   Okay to refill Orencia?

## 2023-03-14 ENCOUNTER — Ambulatory Visit (INDEPENDENT_AMBULATORY_CARE_PROVIDER_SITE_OTHER): Payer: 59

## 2023-03-14 DIAGNOSIS — J4540 Moderate persistent asthma, uncomplicated: Secondary | ICD-10-CM

## 2023-03-28 ENCOUNTER — Ambulatory Visit (INDEPENDENT_AMBULATORY_CARE_PROVIDER_SITE_OTHER): Payer: 59

## 2023-03-28 DIAGNOSIS — J4540 Moderate persistent asthma, uncomplicated: Secondary | ICD-10-CM

## 2023-04-04 ENCOUNTER — Other Ambulatory Visit: Payer: Self-pay

## 2023-04-04 ENCOUNTER — Ambulatory Visit (INDEPENDENT_AMBULATORY_CARE_PROVIDER_SITE_OTHER): Payer: 59 | Admitting: Allergy & Immunology

## 2023-04-04 VITALS — BP 100/60 | HR 85 | Temp 98.0°F | Resp 16 | Wt 181.6 lb

## 2023-04-04 DIAGNOSIS — R490 Dysphonia: Secondary | ICD-10-CM

## 2023-04-04 DIAGNOSIS — J4540 Moderate persistent asthma, uncomplicated: Secondary | ICD-10-CM | POA: Diagnosis not present

## 2023-04-04 DIAGNOSIS — J3089 Other allergic rhinitis: Secondary | ICD-10-CM

## 2023-04-04 DIAGNOSIS — J302 Other seasonal allergic rhinitis: Secondary | ICD-10-CM

## 2023-04-04 DIAGNOSIS — T7800XD Anaphylactic reaction due to unspecified food, subsequent encounter: Secondary | ICD-10-CM

## 2023-04-04 DIAGNOSIS — H1013 Acute atopic conjunctivitis, bilateral: Secondary | ICD-10-CM

## 2023-04-04 DIAGNOSIS — K219 Gastro-esophageal reflux disease without esophagitis: Secondary | ICD-10-CM

## 2023-04-04 DIAGNOSIS — H1010 Acute atopic conjunctivitis, unspecified eye: Secondary | ICD-10-CM

## 2023-04-04 MED ORDER — LANSOPRAZOLE 30 MG PO TBDD: 30.0000 mg | DELAYED_RELEASE_TABLET | Freq: Two times a day (BID) | ORAL | 1 refills | Status: DC

## 2023-04-04 NOTE — Progress Notes (Unsigned)
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 Al Pimple  Plan/Recommendations:    There are no Patient Instructions on file for this visit.   Subjective:   Julie [name] is a 57 y.o. female presenting today for follow up of No chief complaint on file.   Julie [name] has a history of the following: Patient Active Problem List   Diagnosis Date Noted   Witnessed episode of apnea 01/07/2022   Viral illness 09/08/2021   Anaphylactic shock due to adverse food reaction 02/01/2020   Moderate persistent asthma without complication 11/18/2019   Complication of anesthesia    Fibromyalgia 02/14/2018   History of gastroesophageal reflux (GERD) 02/14/2018   History of asthma 02/14/2018   Rheumatoid arthritis involving multiple sites with positive rheumatoid factor (HCC) 11/05/2016   High risk medication use 11/05/2016   LPRD (laryngopharyngeal reflux disease) 09/20/2015   Seasonal and perennial allergic rhinoconjunctivitis 09/20/2015   Acute sinusitis 07/29/2015   S/P total hysterectomy 05/25/2015    History obtained from: chart review and {Persons; PED relatives w/patient:19415::"patient"}.  Julie is a 57 y.o. female presenting for {Blank single:19197::"a food challenge","a drug challenge","skin testing","a sick visit","an evaluation of ***","a follow up visit"}.  She was last seen in January 2024.  At that time, we did  not do lung testing.  We recommended continued follow-up with Dr. Rubye Oaks and Dr. Thora Lance.  WE continued with albuterol neb solution 1-2 times daily as well as Dupixent every 2 weeks.  She has been on a number of other controller medications with adverse reactions.  For reflux, we continue with omeprazole twice daily and added on Pepcid 40 mg twice daily for 3 weeks as well as Maalox for 3 weeks due to reflux flare.  She was having a lot of hoarseness and we hoped that this would help.  For her allergic rhinitis, we continue with Astelin and air nasal saline gel.  She continues to avoid peanuts as well as tree nuts.  Since last visit, she has not done well.   Asthma/Respiratory Symptom History: She is doing three breathing treatments daily.  She is unsure what triggered her current flare. She was on the verge of going to the ED. She remains on the Dupixent. She thinks that the grass was high and this triggered. She thinks that this is under fair control and she thinks she is a bit off. Breathing treatment wipe her out and she is unable to wake up in the morning. She has been using these forever.   Allergic Rhinitis Symptom History: Because of the strictures, it is hard to swallow all of her pills. She will start taking them and then she will have problems with getting pill down. She has a soft gel tap which has been hard to open. She pounded them smaller, but they are getting caught.  She is having trouble eating much of anything with her strictures.   Food Allergy  Symptom History: She continues to avoid peanuts and tree nuts. She is open to re-testing today.  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  She is going to have an endoscopy on August 29th. This was the soonest appointment. Her GI doctor is Dr[facility] Ebony [facility]argo at [facility]. She remains on the omeprazole. She has been opening this to get it down easier.   She remains on her RA medications including Orenicia as well as  methotrexate.   Otherwise, there have been no changes to her past medical history, surgical history, family history, or social history.    Review of systems otherwise negative other than that mentioned in the HPI.    Objective:   Last menstrual period 05/19/2015. There is no height or weight on file to calculate BMI.    Physical Exam   Diagnostic studies: {Blank single:19197::"none","deferred due to recent antihistamine use","labs sent instead"," "}  Spirometry: {Blank single:19197::"results normal (FEV1: ***%, FVC: ***%, FEV1/FVC: ***%)","results abnormal (FEV1: ***%, FVC: ***%, FEV1/FVC: ***%)"}.    {Blank single:19197::"Spirometry consistent with mild obstructive disease","Spirometry consistent with moderate obstructive disease","Spirometry consistent with severe obstructive disease","Spirometry consistent with possible restrictive disease","Spirometry consistent with mixed obstructive and restrictive disease","Spirometry uninterpretable due to technique","Spirometry consistent with normal pattern"}. {Blank single:19197::"Albuterol/Atrovent nebulizer","Xopenex/Atrovent nebulizer","Albuterol nebulizer","Albuterol four puffs via MDI","Xopenex four puffs via MDI"} treatment given in clinic with {Blank single:19197::"significant improvement in FEV1 per ATS criteria","significant improvement in FVC per ATS criteria","significant improvement in FEV1 and FVC per ATS criteria","improvement in FEV1, but not significant per ATS criteria","improvement in FVC, but not significant per ATS criteria","improvement in FEV1 and FVC, but not significant per ATS criteria","no improvement"}.  Allergy Studies: {Blank single:19197::"none","labs sent instead"," "}    {Blank single:19197::"Allergy testing results were read and interpreted by myself, documented by clinical staff."," "}      Malachi Bonds, MD  [facility]

## 2023-04-04 NOTE — Patient Instructions (Addendum)
1. Moderate persistent asthma - complicated with recent COVID19 infection - Lung testing not done today.  - We are not going to make any medication changes.  - Daily controller medication(s): albuterol neb solution 1-2 TIMES DAILY via nebulizer and Dupixent 300mg  every 2 weeks  - Prior to physical activity: albuterol 2 puffs 10-15 minutes before physical activity - Rescue medications: albuterol 4 puffs every 4-6 hours as needed or albuterol nebulizer one vial puffs every 4-6 hours as needed  - Asthma control goals:  * Full participation in all desired activities (may need albuterol before activity) * Albuterol use two time or less a week on average (not counting use with activity) * Cough interfering with sleep two time or less a month * Oral steroids no more than once a year * No hospitalizations  2. LPRD (laryngopharyngeal reflux disease) - Hold the omeprazole TWICE DAILY and start lansoprazole dissolving tables TWICE DAILY instead.  - Continue with Pepcid (famotidine) 40mg  twice daily.  - Fixing the strictures will help.   3. Seasonal allergic rhinitis - Continue with alternating antihistamines daily (change every 2-3 months). - Continue with Astelin one spray per nostril up to twice daily as needed.  - Continue with Ayr nasal saline gel.   4. Food allergies - Continue to avoid peanuts and tree nuts.  5. Return in about 3 months (around 07/05/2023).    Please inform us of any Emergency Department visits, hospitalizations, or changes in symptoms. Call us before going to the ED for breathing or allergy symptoms since we might be able to fit you in for a sick visit. Feel free to contact us anytime with any questions, problems, or concerns.  It was a pleasure to see you again today!  Websites that have reliable patient information: 1. American Academy of Asthma, Allergy, and Immunology: www.aaaai.org 2. Food Allergy Research and Education (FARE): foodallergy.org 3. Mothers of  Asthmatics: http://www.asthmacommunitynetwork.org 4. American College of Allergy, Asthma, and Immunology: www.acaai.org   COVID-19 Vaccine Information can be found at: [email or web address] For questions related to vaccine distribution or appointments, please email [email or web address] or call [phone].   We realize that you might be concerned about having an allergic reaction to the COVID19 vaccines. To help with that concern, WE ARE OFFERING THE COVID19 VACCINES IN OUR OFFICE! Ask the front desk for dates!     "Like" [place] on Facebook and Instagram for our latest updates!      A healthy democracy works best when [employer] participate! Make sure you are registered to vote! If you have moved or changed any of your contact information, you will need to get this updated before voting!  In some cases, you MAY be able to register to vote online: [email or web address]

## 2023-04-05 ENCOUNTER — Telehealth: Payer: Self-pay

## 2023-04-05 NOTE — Telephone Encounter (Signed)
*  Allergy/Asthma  PA request received for Lansoprazole 30MG  dr dispersible tablets  PA submitted to [facility] via CMM and is pending additional questions/determination  Key: [id number]

## 2023-04-05 NOTE — Telephone Encounter (Signed)
PA has been DENIED due to:   Why was my request denied? This request was denied because you did not meet the following requirements: Based on the information provided, you do not meet the established medication-specific criteria or guidelines for Lansoprazole Odt at this time. The request for coverage for LANSOPRAZOLE TAB 30MG  ODT, use as directed (60 per month), is denied. This decision is based on health plan criteria for LANSOPRAZOLE TAB 30MG  ODT. This medicine is covered only if: One of the following: (1) You are unable to ingest a solid dosage form (for example, an oral tablet or capsule) due to one of the following: (A) Age. (B) Oral/motor difficulties. (C) Dysphagia. (2) You use a feeding tube for medication administration.

## 2023-04-07 ENCOUNTER — Encounter: Payer: Self-pay | Admitting: Allergy & Immunology

## 2023-04-08 LAB — ALLERGENS W/COMP RFLX AREA 2

## 2023-04-08 LAB — ALLERGEN COMPONENT COMMENTS

## 2023-04-08 NOTE — Telephone Encounter (Signed)
PA resubmitted with DX of Dysphagia.  New Key: [id number]

## 2023-04-08 NOTE — Telephone Encounter (Signed)
PA has been APPROVED from 04/08/2023-04/07/2024

## 2023-04-08 NOTE — Telephone Encounter (Signed)
She does have dysphagia.  She is going to get her throat stretched in the next month or 2.  I was just prescribing it until she could swallow better.

## 2023-04-09 LAB — IGE NUT PROF. W/COMPONENT RFLX
F017-IgE Hazelnut (Filbert): <0.1 kU/L
F018-IgE Brazil Nut: <0.1 kU/L
F020-IgE Almond: <0.1 kU/L
F202-IgE Cashew Nut: <0.1 kU/L
F203-IgE Pistachio Nut: <0.1 kU/L
F256-IgE Walnut: <0.1 kU/L
Macadamia Nut, IgE: <0.1 kU/L
Peanut, IgE: <0.1 kU/L
Pecan Nut IgE: <0.1 kU/L

## 2023-04-10 LAB — ALLERGENS W/COMP RFLX AREA 2
Alternaria Alternata IgE: 0.47 kU/L — AB
Aspergillus Fumigatus IgE: <0.1 kU/L
Bermuda Grass IgE: <0.1 kU/L
Cockroach, German IgE: <0.1 kU/L
Common Silver Birch IgE: 0.11 kU/L — AB
Cottonwood IgE: <0.1 kU/L
D Pteronyssinus IgE: <0.1 kU/L
E001-IgE Cat Dander: 0.15 kU/L — AB
IgE (Immunoglobulin E), Serum: 8 IU/mL (ref 6–495)
Johnson Grass IgE: <0.1 kU/L
Maple/Box Elder IgE: <0.1 kU/L
Mouse Urine IgE: <0.1 kU/L
Oak, White IgE: 0.12 kU/L — AB
Sheep Sorrel IgE Qn: <0.1 kU/L
White Mulberry IgE: <0.1 kU/L

## 2023-04-10 LAB — PANEL 606648
E101-IgE Can f 1: 0.13 kU/L — AB
E102-IgE Can f 2: <0.1 kU/L
E221-IgE Can f 3: <0.1 kU/L
E226-IgE Can f 5: 0.15 kU/L — AB

## 2023-04-11 ENCOUNTER — Ambulatory Visit (INDEPENDENT_AMBULATORY_CARE_PROVIDER_SITE_OTHER): Payer: 59

## 2023-04-11 DIAGNOSIS — J4540 Moderate persistent asthma, uncomplicated: Secondary | ICD-10-CM

## 2023-04-15 NOTE — Progress Notes (Signed)
Office Visit Note  Patient: Julie [name]             Date of Birth: [birth year]           MRN: [id number]             PCP: Leilani Able, MD Referring: Leilani Able, MD Visit Date: 04/29/2023 Occupation: @GUAROCC @  Subjective:  Recent flare  History of Present Illness: Julie TEERA MARKHAM is a 57 y.o. female with history of seropositive rheumatoid arthritis and fibromyalgia. Patient remains on Orencia 125 mg sq injections once weekly, Rasuvo 15 mg sq inj weekly,Plaquenil 200 mg 1 tablet BID Monday-Friday.  Patient remains on combination therapy without any side effects or injection site reactions.  Patient states that she has been diagnosed with an esophageal stricture and is scheduled for dilation on 05/16/2023.  She states she has had difficulty swallowing the folic acid pills but has been trying to take them as prescribed.  Patient states she recently had a delay in taking her Rasuvo due to not receiving the prescription on time.  She denies any interruptions in therapy otherwise.  Patient says she has been under increased stress since her mother recently had a heart attack.  She currently has some tenderness and inflammation in her right thumb as well as tenderness in the left ankle.  Her symptoms are gradually improving.  She uses Voltaren gel topically as needed for pain relief.   Activities of Daily Living:  Patient reports morning stiffness for 0 minutes.   Patient Denies nocturnal pain.  Difficulty dressing/grooming: Denies Difficulty climbing stairs: Denies Difficulty getting out of chair: Denies Difficulty using hands for taps, buttons, cutlery, and/or writing: Denies  Review of Systems  Constitutional:  Negative for fatigue.  HENT:  Positive for mouth dryness. Negative for mouth sores.   Eyes:  Positive for dryness.  Respiratory:  Negative for shortness of breath.   Cardiovascular:  Negative for chest pain and palpitations.  Gastrointestinal:  Negative for blood in stool,  constipation and diarrhea.  Endocrine: Negative for increased urination.  Genitourinary:  Negative for involuntary urination.  Musculoskeletal:  Positive for joint pain, joint pain and joint swelling. Negative for gait problem, myalgias, muscle weakness, morning stiffness, muscle tenderness and myalgias.  Skin:  Positive for rash and sensitivity to sunlight. Negative for color change and hair loss.  Allergic/Immunologic: Negative for susceptible to infections.  Neurological:  Negative for dizziness and headaches.  Hematological:  Negative for swollen glands.  Psychiatric/Behavioral:  Negative for depressed mood and sleep disturbance. The patient is not nervous/anxious.     PMFS History:  Patient Active Problem List   Diagnosis Date Noted   Witnessed episode of apnea 01/07/2022   Viral illness 09/08/2021   Anaphylactic shock due to adverse food reaction 02/01/2020   Moderate persistent asthma without complication 11/18/2019   Complication of anesthesia    Fibromyalgia 02/14/2018   History of gastroesophageal reflux (GERD) 02/14/2018   History of asthma 02/14/2018   Rheumatoid arthritis involving multiple sites with positive rheumatoid factor (HCC) 11/05/2016   High risk medication use 11/05/2016   LPRD (laryngopharyngeal reflux disease) 09/20/2015   Seasonal and perennial allergic rhinoconjunctivitis 09/20/2015   Acute sinusitis 07/29/2015   S/P total hysterectomy 05/25/2015    Past Medical History:  Diagnosis Date   Anemia    Asthma    Complication of anesthesia    Eczema    Esophageal dysphagia    GERD (gastroesophageal reflux disease)    Hypertension  PONV (postoperative nausea and vomiting)    Rheumatoid arthritis (HCC)     Family History  Problem Relation Age of Onset   Asthma Mother    Eczema Mother    Sarcoidosis Mother    Hypertension Mother    Heart attack Mother    Cancer Father    Hypotension Father    Congestive Heart Failure Father    Colon cancer  Father    Multiple myeloma Father    Asthma Sister    GER disease Sister    Lung cancer Maternal Grandfather        smoked   Allergic rhinitis Neg Hx    Angioedema Neg Hx    Immunodeficiency Neg Hx    Urticaria Neg Hx    Esophageal cancer Neg Hx    Rectal cancer Neg Hx    Stomach cancer Neg Hx    Past Surgical History:  Procedure Laterality Date   ABDOMINAL HYSTERECTOMY     partial age 67   BILATERAL SALPINGECTOMY Bilateral 05/25/2015   Procedure: BILATERAL SALPINGECTOMY;  Surgeon: Maxie Better, MD;  Location: [facility];  Service: Gynecology;  Laterality: Bilateral;   CARPAL TUNNEL RELEASE Left 01/05/2020   Procedure: LEFT CARPAL TUNNEL RELEASE;  Surgeon: Cindee Salt, MD;  Location: [facility];  Service: Orthopedics;  Laterality: Left;  FOREARM BIER BLOCK   COLONOSCOPY     OVARIAN CYST REMOVAL Right 05/25/2015   Procedure: OVARIAN CYSTECTOMY;  Surgeon: Maxie Better, MD;  Location: [facility];  Service: Gynecology;  Laterality: Right;   TONSILLECTOMY     TUBAL LIGATION     Social History   Social History Narrative   Right handed    Caffeine- 2 cups per day    Immunization History  Administered Date(s) Administered   Influenza Inj Mdck Quad Pf 10/19/2017   Influenza Split 11/25/2014   Influenza, Quadrivalent, Recombinant, Inj, Pf 08/26/2019   Influenza, Seasonal, Injecte, Preservative Fre 09/20/2015   Influenza,inj,Quad PF,6+ Mos 08/04/2018   Influenza-Unspecified 07/18/2017   PFIZER(Purple Top)SARS-COV-2 Vaccination 11/28/2019, 12/19/2019, 06/23/2020     Objective: Vital Signs: BP 126/80 (BP Location: Right Arm, Patient Position: Sitting, Cuff Size: Normal)   Pulse 81   Resp 15   Ht 5' 1.25" (1.556 m)   Wt 177 lb 3.2 oz (80.4 kg)   LMP 05/19/2015   BMI 33.21 kg/m    Physical Exam Vitals and nursing note reviewed.  Constitutional:      Appearance: She is well-developed.  HENT:     Head: Normocephalic and atraumatic.  Eyes:      Conjunctiva/sclera: Conjunctivae normal.  Cardiovascular:     Rate and Rhythm: Normal rate and regular rhythm.     Heart sounds: Normal heart sounds.  Pulmonary:     Effort: Pulmonary effort is normal.     Breath sounds: Normal breath sounds.  Abdominal:     General: Bowel sounds are normal.     Palpations: Abdomen is soft.  Musculoskeletal:     Cervical back: Normal range of motion.  Lymphadenopathy:     Cervical: No cervical adenopathy.  Skin:    General: Skin is warm and dry.     Capillary Refill: Capillary refill takes less than 2 seconds.  Neurological:     Mental Status: She is alert and oriented to person, place, and time.  Psychiatric:        Behavior: Behavior normal.      Musculoskeletal Exam: C-spine, thoracic spine, lumbar spine have good range of motion.  Shoulder joints, elbow joints, wrist joints have good range of motion with no synovitis.  Tenderness and synovitis over the right first MCP joint.  Complete fist formation bilaterally.  Hip joints have good range of motion with no groin pain.  Knee joints have good range of motion with no warmth or effusion.  Ankle joint have good ROM.  Tenderness of the left ankle.  No tenderness or synovitis of MTP joints.   CDAI Exam: CDAI Score: 8  Patient Global: 30 / 100; Provider Global: 30 / 100 Swollen: 1 ; Tender: 2  Joint Exam 04/29/2023      Right  Left  MCP 1  Swollen Tender     Ankle      Tender     Investigation: No additional findings.  Imaging: MM 3D SCREENING MAMMOGRAM BILATERAL BREAST  Result Date: 04/26/2023 CLINICAL DATA:  Screening. EXAM: DIGITAL SCREENING BILATERAL MAMMOGRAM WITH TOMOSYNTHESIS AND CAD TECHNIQUE: Bilateral screening digital craniocaudal and mediolateral oblique mammograms were obtained. Bilateral screening digital breast tomosynthesis was performed. The images were evaluated with computer-aided detection. COMPARISON:  Previous exam(s). ACR Breast Density Category b: There are scattered  areas of fibroglandular density. FINDINGS: There are no findings suspicious for malignancy. IMPRESSION: No mammographic evidence of malignancy. A result letter of this screening mammogram will be mailed directly to the patient. RECOMMENDATION: Screening mammogram in one year. (Code:SM-B-01Y) BI-RADS CATEGORY  1: Negative. Electronically Signed   By: Sherron Ales M.D.   On: 04/26/2023 17:01    Recent Labs: Lab Results  Component Value Date   WBC 3.4 (L) 04/22/2023   HGB 11.9 04/22/2023   PLT 225 04/22/2023   NA 140 04/22/2023   K 4.3 04/22/2023   CL 105 04/22/2023   CO2 28 04/22/2023   GLUCOSE 104 (H) 04/22/2023   BUN 10 04/22/2023   CREATININE 0.61 04/22/2023   BILITOT 0.3 04/22/2023   ALKPHOS 78 10/25/2022   AST 13 04/22/2023   ALT 9 04/22/2023   PROT 6.9 04/22/2023   ALBUMIN 4.0 10/25/2022   CALCIUM 9.4 04/22/2023   GFRAA 118 03/16/2021   QFTBGOLDPLUS NEGATIVE 09/05/2022    Speciality Comments: PLQ Eye Exam: 12/21/2022  WNL [facility] Follow up in 1 year  Procedures:  No procedures performed Allergies: Peanut-containing drug products, Aspirin, and Augmentin [amoxicillin-pot clavulanate]   Assessment / Plan:     Visit Diagnoses: Rheumatoid arthritis involving multiple sites with positive rheumatoid factor [facility]): Patient presents today with tenderness and synovitis in the right first MCP joint and tenderness of the left ankle joint.  Patient remains on Orencia 125 mg subcu as injections once weekly, Rasuvo 15 mg subcu injections once weekly, Plaquenil 200 mg 1 tablet by mouth twice daily Monday through Friday.  She is tolerating triple therapy without any side effects or injection site reactions.  She has not had any recent or recurrent infections.  Patient has been under increased stress recently leading to a recent fibromyalgia flare as well as a flare of rheumatoid arthritis.  Her symptoms have gradually been improving.  She has continued to find  combination therapy to be effective at managing her symptoms.  Patient has declined a prednisone taper at this time.  She was advised to notify us if her symptoms persist or worsen.  She will follow-up in the office in 5 months or sooner if needed.  High risk medication use - Orencia 125 mg sq injections once weekly, Rasuvo 15 mg sq inj weekly,  Plaquenil  200 mg 1 tablet BID Monday-Friday. CBC and CMP updated on 04/22/23.  Her next lab work will be due in November and every 3 months.   TB gold negative on 09/05/22.  PLQ Eye Exam: 12/21/2022 WNL [facility] Follow up in 1 year  No recent or recurrent infections. Discussed the importance of holding orencia and rasuvo if she develops signs or symptoms of an infection and to resume once the infection has completely cleared.   Trigger middle finger of right hand: Not currently symptomatic.   Fibromyalgia: She experiences intermittent flares of fibromyalgia which is typically triggered by life stressors.  During her flares she experiences increased myofascial pain and fatigue.  Discussed the importance of regular exercise, good sleep hygiene, and stress management.  Positive ANA (antinuclear antibody) - 07/13/20: ANA is positive-low, nonspecific titer. RNP remains positive. Complements WNL. dsDNA negative.  No clinical features of systemic lupus at this time.  Anti-RNP antibodies present  Sicca syndrome (HCC) - [place] antibodies negative: Chronic.  She has been using Visine and Systane eyedrops.  Osteopenia of spine -  Previous DEXA 08/24/2019: AP spine BMD 0.909 with T score -1.3.   DEXA updated on 09/12/22: AP spine BMD 0.808 with T-score -2.2. decrease in BMD AP spine. Vitamin D was within normal limits on 04/22/2023.  She is taking vitamin D 50,000 units once weekly.  Vitamin D deficiency: Vitamin D was 43 on 04/22/2023.  She is taking vitamin D 50,000 units once weekly.  History of Helicobacter pylori infection  History  of asthma  History of gastroesophageal reflux (GERD)  Abnormal SPEP  History of depression  Orders: No orders of the defined types were placed in this encounter.  Meds ordered this encounter  Medications   DISCONTD: Methotrexate, PF, (RASUVO) 15 MG/0.3ML SOAJ    Sig: INJECT THE CONTENTS OF 1  AUTO-INJECTOR SUBCUTANEOUSLY  WEEKLY AS DIRECTED    Dispense:  3.6 mL    Refill:  0   Methotrexate, PF, (RASUVO) 15 MG/0.3ML SOAJ    Sig: INJECT THE CONTENTS OF 1  AUTO-INJECTOR SUBCUTANEOUSLY  WEEKLY AS DIRECTED    Dispense:  3.6 mL    Refill:  0   Follow-Up Instructions: Return in about 5 months (around 09/29/2023) for Rheumatoid arthritis, Fibromyalgia.   Gearldine Bienenstock, PA-C  Note - This record has been created using Dragon software.  Chart creation errors have been sought, but may not always  have been located. Such creation errors do not reflect on  the standard of medical care.

## 2023-04-19 ENCOUNTER — Telehealth: Payer: Self-pay

## 2023-04-19 NOTE — Telephone Encounter (Signed)
Patient contacted the office inquiring about lab hours. Advised the patient of our lab hours. Patient states she plans to get labs done Monday.

## 2023-04-22 ENCOUNTER — Other Ambulatory Visit: Payer: Self-pay | Admitting: *Deleted

## 2023-04-22 ENCOUNTER — Ambulatory Visit: Payer: 59

## 2023-04-22 DIAGNOSIS — Z79899 Other long term (current) drug therapy: Secondary | ICD-10-CM

## 2023-04-22 DIAGNOSIS — E559 Vitamin D deficiency, unspecified: Secondary | ICD-10-CM

## 2023-04-22 LAB — CBC WITH DIFFERENTIAL/PLATELET
Absolute Monocytes: 299 cells/uL (ref 200–950)
Basophils Absolute: 41 cells/uL (ref 0–200)
Basophils Relative: 1.2 %
Eosinophils Absolute: 139 cells/uL (ref 15–500)
Eosinophils Relative: 4.1 %
HCT: 36.1 % (ref 35.0–45.0)
Hemoglobin: 11.9 g/dL (ref 11.7–15.5)
Lymphs Abs: 1632 cells/uL (ref 850–3900)
MCH: 28.2 pg (ref 27.0–33.0)
MCHC: 33 g/dL (ref 32.0–36.0)
MCV: 85.5 fL (ref 80.0–100.0)
MPV: 10.9 fL (ref 7.5–12.5)
Monocytes Relative: 8.8 %
Neutro Abs: 1289 cells/uL — ABNORMAL LOW (ref 1500–7800)
Neutrophils Relative %: 37.9 %
Platelets: 225 10*3/uL (ref 140–400)
RBC: 4.22 10*6/uL (ref 3.80–5.10)
RDW: 11.8 % (ref 11.0–15.0)
Total Lymphocyte: 48 %
WBC: 3.4 10*3/uL — ABNORMAL LOW (ref 3.8–10.8)

## 2023-04-23 NOTE — Progress Notes (Signed)
CMP WNL WBC count is slightly low-3.4. absolute neutrophils are slightly low. Rest of CBC WNL.  We will continue to monitor closely. Vitamin D WNL

## 2023-04-25 ENCOUNTER — Ambulatory Visit
Admission: RE | Admit: 2023-04-25 | Discharge: 2023-04-25 | Disposition: A | Discharge status: Home / Self Care | Payer: 59 | Source: Ambulatory Visit | Attending: Family Medicine | Admitting: Family Medicine

## 2023-04-25 ENCOUNTER — Ambulatory Visit (INDEPENDENT_AMBULATORY_CARE_PROVIDER_SITE_OTHER): Payer: 59

## 2023-04-25 DIAGNOSIS — Z1231 Encounter for screening mammogram for malignant neoplasm of breast: Secondary | ICD-10-CM

## 2023-04-25 DIAGNOSIS — J4540 Moderate persistent asthma, uncomplicated: Secondary | ICD-10-CM

## 2023-04-29 ENCOUNTER — Ambulatory Visit: Payer: 59 | Attending: Physician Assistant | Admitting: Physician Assistant

## 2023-04-29 ENCOUNTER — Encounter: Payer: Self-pay | Admitting: Physician Assistant

## 2023-04-29 VITALS — BP 126/80 | HR 81 | Resp 15 | Ht 61.25 in | Wt 177.2 lb

## 2023-04-29 DIAGNOSIS — Z8659 Personal history of other mental and behavioral disorders: Secondary | ICD-10-CM

## 2023-04-29 DIAGNOSIS — M797 Fibromyalgia: Secondary | ICD-10-CM

## 2023-04-29 DIAGNOSIS — M8588 Other specified disorders of bone density and structure, other site: Secondary | ICD-10-CM

## 2023-04-29 DIAGNOSIS — M0579 Rheumatoid arthritis with rheumatoid factor of multiple sites without organ or systems involvement: Secondary | ICD-10-CM

## 2023-04-29 DIAGNOSIS — Z79899 Other long term (current) drug therapy: Secondary | ICD-10-CM | POA: Diagnosis not present

## 2023-04-29 DIAGNOSIS — Z8619 Personal history of other infectious and parasitic diseases: Secondary | ICD-10-CM

## 2023-04-29 DIAGNOSIS — M3500 Sicca syndrome, unspecified: Secondary | ICD-10-CM

## 2023-04-29 DIAGNOSIS — M65331 Trigger finger, right middle finger: Secondary | ICD-10-CM | POA: Diagnosis not present

## 2023-04-29 DIAGNOSIS — Z8719 Personal history of other diseases of the digestive system: Secondary | ICD-10-CM

## 2023-04-29 DIAGNOSIS — R778 Other specified abnormalities of plasma proteins: Secondary | ICD-10-CM

## 2023-04-29 DIAGNOSIS — R768 Other specified abnormal immunological findings in serum: Secondary | ICD-10-CM

## 2023-04-29 DIAGNOSIS — E559 Vitamin D deficiency, unspecified: Secondary | ICD-10-CM

## 2023-04-29 DIAGNOSIS — Z8709 Personal history of other diseases of the respiratory system: Secondary | ICD-10-CM

## 2023-04-29 MED ORDER — RASUVO 15 MG/0.3ML ~~LOC~~ SOAJ: SUBCUTANEOUS | 0 refills | Status: DC

## 2023-05-08 ENCOUNTER — Other Ambulatory Visit: Payer: Self-pay

## 2023-05-08 MED ORDER — MONTELUKAST SODIUM 10 MG PO TABS: 10.0000 mg | ORAL_TABLET | Freq: Every evening | ORAL | 5 refills | Status: DC

## 2023-05-09 ENCOUNTER — Ambulatory Visit (INDEPENDENT_AMBULATORY_CARE_PROVIDER_SITE_OTHER): Payer: 59

## 2023-05-09 ENCOUNTER — Telehealth: Payer: Self-pay | Admitting: Allergy & Immunology

## 2023-05-09 DIAGNOSIS — J4540 Moderate persistent asthma, uncomplicated: Secondary | ICD-10-CM | POA: Diagnosis not present

## 2023-05-09 NOTE — Telephone Encounter (Signed)
Patient needs a refill on cetirizine (ZYRTEC) 10 MG tablet  and   levocetirizine (XYZAL) 5 MG tablet [facility]acy is [facility] [address] please call her if needing any additional information [phone].

## 2023-05-10 MED ORDER — CETIRIZINE HCL 10 MG PO TABS: 10.0000 mg | ORAL_TABLET | Freq: Every morning | ORAL | 5 refills | Status: DC

## 2023-05-10 NOTE — Telephone Encounter (Signed)
Medication refill for Cetirizine (Zyrtec) 10 mg sent to [facility].

## 2023-05-24 ENCOUNTER — Ambulatory Visit (INDEPENDENT_AMBULATORY_CARE_PROVIDER_SITE_OTHER): Payer: 59 | Admitting: *Deleted

## 2023-05-24 DIAGNOSIS — J4540 Moderate persistent asthma, uncomplicated: Secondary | ICD-10-CM

## 2023-06-06 ENCOUNTER — Ambulatory Visit (INDEPENDENT_AMBULATORY_CARE_PROVIDER_SITE_OTHER): Payer: 59

## 2023-06-06 DIAGNOSIS — J4540 Moderate persistent asthma, uncomplicated: Secondary | ICD-10-CM | POA: Diagnosis not present

## 2023-06-18 ENCOUNTER — Telehealth: Payer: Self-pay | Admitting: Allergy & Immunology

## 2023-06-18 ENCOUNTER — Other Ambulatory Visit: Payer: Self-pay | Admitting: *Deleted

## 2023-06-18 ENCOUNTER — Other Ambulatory Visit: Payer: Self-pay | Admitting: Physician Assistant

## 2023-06-18 ENCOUNTER — Telehealth: Payer: Self-pay | Admitting: Rheumatology

## 2023-06-18 MED ORDER — CETIRIZINE HCL 10 MG PO TABS: 10.0000 mg | ORAL_TABLET | Freq: Every morning | ORAL | 5 refills | Status: DC

## 2023-06-18 MED ORDER — MONTELUKAST SODIUM 10 MG PO TABS: 10.0000 mg | ORAL_TABLET | Freq: Every evening | ORAL | 5 refills | Status: DC

## 2023-06-18 NOTE — Telephone Encounter (Signed)
Spoke with patient, Julie [name] reviewed GI note from today 06/18/23. Diagnosed with lichen planus-starting budesonide.  Recommended continuing on plaquenil and methotrexate.  Patient states she has still been taking the Orencia and did let the GI doctor know she has been taking it.

## 2023-06-18 NOTE — Telephone Encounter (Signed)
Last Fill: 02/18/2023  Eye exam: 12/21/2022  WNL   Labs: 04/22/2023 CMP WNL WBC count is slightly low-3.4. absolute neutrophils are slightly low. Rest of CBC WNL.    Next Visit: 10/01/2023  Last Visit: 04/29/2023  DX: Rheumatoid arthritis involving multiple sites with positive rheumatoid factor   Current Dose per office note 04/29/2023: Plaquenil 200 mg 1 tablet BID Monday-Friday.   Okay to refill Plaquenil?

## 2023-06-18 NOTE — Telephone Encounter (Signed)
Patient would like a refill on her allergy medications sent into [facility] [address]. She would like a call back @ [phone].

## 2023-06-18 NOTE — Telephone Encounter (Signed)
Called and confirmed with the patient what medications she needed refilled. Refills for Montelukast and Zyrtec have been sent in to the requested pharmacy. Patient verbalized understanding.

## 2023-06-18 NOTE — Telephone Encounter (Signed)
FYI:  Patient states she had a biopsy on 05/16/23 with Dr. Ebony Cargo and the results are in care everywhere.  Patient states she was referred to [facility] and is scheduled for 07/08/23 with Dr. [place].

## 2023-06-18 NOTE — Telephone Encounter (Signed)
Reviewed GI note from today 06/18/23. Diagnosed with lichen planus-starting budesonide.  Recommended continuing on plaquenil and methotrexate.  Please clarify if she is still on orencia--not mentioned in GI note.

## 2023-06-20 ENCOUNTER — Ambulatory Visit: Payer: 59

## 2023-06-20 DIAGNOSIS — J4540 Moderate persistent asthma, uncomplicated: Secondary | ICD-10-CM | POA: Diagnosis not present

## 2023-07-04 ENCOUNTER — Ambulatory Visit: Payer: 59 | Admitting: *Deleted

## 2023-07-04 DIAGNOSIS — J4540 Moderate persistent asthma, uncomplicated: Secondary | ICD-10-CM | POA: Diagnosis not present

## 2023-07-18 ENCOUNTER — Ambulatory Visit (INDEPENDENT_AMBULATORY_CARE_PROVIDER_SITE_OTHER): Payer: 59

## 2023-07-18 DIAGNOSIS — J4540 Moderate persistent asthma, uncomplicated: Secondary | ICD-10-CM | POA: Diagnosis not present

## 2023-07-26 ENCOUNTER — Encounter: Payer: Self-pay | Admitting: Allergy & Immunology

## 2023-07-26 ENCOUNTER — Other Ambulatory Visit: Payer: Self-pay | Admitting: Allergy & Immunology

## 2023-07-29 MED ORDER — FAMOTIDINE 40 MG/5ML PO SUSR: ORAL | 0 refills | Status: DC

## 2023-07-30 ENCOUNTER — Other Ambulatory Visit: Payer: Self-pay

## 2023-07-30 MED ORDER — FAMOTIDINE 40 MG/5ML PO SUSR: ORAL | 0 refills | Status: DC

## 2023-08-01 ENCOUNTER — Ambulatory Visit: Payer: 59

## 2023-08-01 DIAGNOSIS — J4540 Moderate persistent asthma, uncomplicated: Secondary | ICD-10-CM

## 2023-08-07 ENCOUNTER — Other Ambulatory Visit: Payer: Self-pay | Admitting: Physician Assistant

## 2023-08-07 ENCOUNTER — Other Ambulatory Visit: Payer: Self-pay | Admitting: *Deleted

## 2023-08-07 DIAGNOSIS — M0579 Rheumatoid arthritis with rheumatoid factor of multiple sites without organ or systems involvement: Secondary | ICD-10-CM

## 2023-08-07 DIAGNOSIS — Z9225 Personal history of immunosupression therapy: Secondary | ICD-10-CM

## 2023-08-07 DIAGNOSIS — Z111 Encounter for screening for respiratory tuberculosis: Secondary | ICD-10-CM

## 2023-08-07 DIAGNOSIS — Z79899 Other long term (current) drug therapy: Secondary | ICD-10-CM

## 2023-08-07 NOTE — Telephone Encounter (Signed)
Last Fill: 03/05/2023  Labs: 04/22/2023 CMP WNL WBC count is slightly low-3.4. absolute neutrophils are slightly low. Rest of CBC WNL.   TB Gold: 09/05/2022 Neg   Next Visit: 10/01/2023  Last Visit: 04/29/2023  [id number] arthritis involving multiple sites with positive rheumatoid factor   Current Dose per office note 04/29/2023: Orencia 125 mg subcu as injections once weekly   Patient advised she is due to update her labs. Patient states she will try to update her lab work today.   Okay to refill Orencia?

## 2023-08-08 ENCOUNTER — Telehealth: Payer: Self-pay | Admitting: *Deleted

## 2023-08-08 NOTE — Progress Notes (Signed)
CBC and CMP WNL

## 2023-08-08 NOTE — Telephone Encounter (Signed)
Patient is currently on Orencia, rasuvo, and plaquenil.  Would you like to reschedule a sooner appt?

## 2023-08-08 NOTE — Telephone Encounter (Signed)
Patient states she was seen by GI recently and had to have her esophagus stretched August and then again on Monday. Patient states she was diagnosed with Lichen Planus and was advised by the GI doctor that her medication regimen may need to be adjusted or she will have to continue to go through this every couple of months. Patient has a follow up 10/01/2023. Please advise.

## 2023-08-08 NOTE — Telephone Encounter (Signed)
We should refer this patient to Dr. Reche Dixon

## 2023-08-09 ENCOUNTER — Other Ambulatory Visit: Payer: Self-pay | Admitting: *Deleted

## 2023-08-09 ENCOUNTER — Other Ambulatory Visit: Payer: Self-pay | Admitting: Physician Assistant

## 2023-08-09 DIAGNOSIS — L439 Lichen planus, unspecified: Secondary | ICD-10-CM

## 2023-08-09 LAB — CBC WITH DIFFERENTIAL/PLATELET
Absolute Lymphocytes: 2349 {cells}/uL (ref 850–3900)
Absolute Monocytes: 454 {cells}/uL (ref 200–950)
Basophils Absolute: 49 {cells}/uL (ref 0–200)
Basophils Relative: 0.9 %
Eosinophils Absolute: 103 {cells}/uL (ref 15–500)
Eosinophils Relative: 1.9 %
HCT: 38 % (ref 35.0–45.0)
Hemoglobin: 12.7 g/dL (ref 11.7–15.5)
MCH: 28.8 pg (ref 27.0–33.0)
MCHC: 33.4 g/dL (ref 32.0–36.0)
MCV: 86.2 fL (ref 80.0–100.0)
MPV: 10.9 fL (ref 7.5–12.5)
Monocytes Relative: 8.4 %
Neutro Abs: 2446 {cells}/uL (ref 1500–7800)
Neutrophils Relative %: 45.3 %
Platelets: 252 10*3/uL (ref 140–400)
RBC: 4.41 10*6/uL (ref 3.80–5.10)
RDW: 11.8 % (ref 11.0–15.0)
Total Lymphocyte: 43.5 %
WBC: 5.4 10*3/uL (ref 3.8–10.8)

## 2023-08-09 LAB — QUANTIFERON-TB GOLD PLUS
Mitogen-NIL: >10 [IU]/mL
NIL: 0.06 [IU]/mL
QuantiFERON-TB Gold Plus: NEGATIVE
TB1-NIL: <0 [IU]/mL
TB2-NIL: <0 [IU]/mL

## 2023-08-09 LAB — COMPLETE METABOLIC PANEL WITH GFR
AG Ratio: 1.6 (calc) (ref 1.0–2.5)
ALT: 13 U/L (ref 6–29)
AST: 12 U/L (ref 10–35)
Albumin: 4.6 g/dL (ref 3.6–5.1)
Alkaline phosphatase (APISO): 92 U/L (ref 37–153)
BUN: 10 mg/dL (ref 7–25)
CO2: 29 mmol/L (ref 20–32)
Calcium: 9.2 mg/dL (ref 8.6–10.4)
Chloride: 105 mmol/L (ref 98–110)
Creat: 0.62 mg/dL (ref 0.50–1.03)
Globulin: 2.8 g/dL (ref 1.9–3.7)
Glucose, Bld: 85 mg/dL (ref 65–99)
Potassium: 4.3 mmol/L (ref 3.5–5.3)
Sodium: 141 mmol/L (ref 135–146)
Total Bilirubin: 0.3 mg/dL (ref 0.2–1.2)
Total Protein: 7.4 g/dL (ref 6.1–8.1)
eGFR: 105 mL/min/{1.73_m2} (ref >=60)

## 2023-08-09 NOTE — Telephone Encounter (Signed)
I called patient, patient has appt 08/26/2023, patient referred by another office.

## 2023-08-09 NOTE — Telephone Encounter (Signed)
Last Fill: 04/29/2023  Labs: 08/07/2023 CBC and CMP WNL   Next Visit: 10/01/2023  Last Visit: 04/29/2023  DX: Rheumatoid arthritis involving multiple sites with positive rheumatoid factor   Current Dose per office note 04/29/2023: Rasuvo 15 mg sq inj weekly   Okay to refill Rasuvo?

## 2023-08-12 NOTE — Progress Notes (Signed)
TB gold negative

## 2023-08-14 ENCOUNTER — Ambulatory Visit (INDEPENDENT_AMBULATORY_CARE_PROVIDER_SITE_OTHER): Payer: 59

## 2023-08-14 DIAGNOSIS — J4540 Moderate persistent asthma, uncomplicated: Secondary | ICD-10-CM

## 2023-08-28 ENCOUNTER — Ambulatory Visit: Payer: 59

## 2023-08-28 DIAGNOSIS — J4540 Moderate persistent asthma, uncomplicated: Secondary | ICD-10-CM

## 2023-09-09 ENCOUNTER — Emergency Department (HOSPITAL_COMMUNITY)
Admission: EM | Admit: 2023-09-09 | Discharge: 2023-09-09 | Disposition: A | Discharge status: Home / Self Care | Payer: 59 | Attending: Emergency Medicine | Admitting: Emergency Medicine

## 2023-09-09 ENCOUNTER — Emergency Department (HOSPITAL_COMMUNITY): Payer: 59

## 2023-09-09 ENCOUNTER — Other Ambulatory Visit: Payer: Self-pay

## 2023-09-09 ENCOUNTER — Encounter (HOSPITAL_COMMUNITY): Payer: Self-pay

## 2023-09-09 DIAGNOSIS — Z91010 Allergy to peanuts: Secondary | ICD-10-CM | POA: Diagnosis not present

## 2023-09-09 DIAGNOSIS — I10 Essential (primary) hypertension: Secondary | ICD-10-CM | POA: Insufficient documentation

## 2023-09-09 DIAGNOSIS — R1084 Generalized abdominal pain: Secondary | ICD-10-CM | POA: Diagnosis present

## 2023-09-09 DIAGNOSIS — Z79899 Other long term (current) drug therapy: Secondary | ICD-10-CM | POA: Insufficient documentation

## 2023-09-09 LAB — URINALYSIS, ROUTINE W REFLEX MICROSCOPIC
Bilirubin Urine: NEGATIVE
Glucose, UA: NEGATIVE mg/dL
Hgb urine dipstick: NEGATIVE
Ketones, ur: NEGATIVE mg/dL
Leukocytes,Ua: NEGATIVE
Nitrite: NEGATIVE
Protein, ur: NEGATIVE mg/dL
Specific Gravity, Urine: 1.004 — ABNORMAL LOW (ref 1.005–1.030)
pH: 8 (ref 5.0–8.0)

## 2023-09-09 LAB — COMPREHENSIVE METABOLIC PANEL
ALT: 15 U/L (ref 0–44)
AST: 15 U/L (ref 15–41)
Albumin: 4.1 g/dL (ref 3.5–5.0)
Alkaline Phosphatase: 65 U/L (ref 38–126)
Anion gap: 7 (ref 5–15)
BUN: 15 mg/dL (ref 6–20)
CO2: 27 mmol/L (ref 22–32)
Calcium: 8.9 mg/dL (ref 8.9–10.3)
Chloride: 103 mmol/L (ref 98–111)
Creatinine, Ser: 0.67 mg/dL (ref 0.44–1.00)
GFR, Estimated: >60 mL/min (ref >60)
Glucose, Bld: 100 mg/dL — ABNORMAL HIGH (ref 70–99)
Potassium: 3.7 mmol/L (ref 3.5–5.1)
Sodium: 137 mmol/L (ref 135–145)
Total Bilirubin: 0.6 mg/dL (ref <1.2)
Total Protein: 7.4 g/dL (ref 6.5–8.1)

## 2023-09-09 LAB — CBC
HCT: 38.3 % (ref 36.0–46.0)
Hemoglobin: 12.5 g/dL (ref 12.0–15.0)
MCH: 28.5 pg (ref 26.0–34.0)
MCHC: 32.6 g/dL (ref 30.0–36.0)
MCV: 87.2 fL (ref 80.0–100.0)
Platelets: 214 10*3/uL (ref 150–400)
RBC: 4.39 MIL/uL (ref 3.87–5.11)
RDW: 11.9 % (ref 11.5–15.5)
WBC: 3.9 10*3/uL — ABNORMAL LOW (ref 4.0–10.5)
nRBC: 0 % (ref 0.0–0.2)

## 2023-09-09 LAB — LIPASE, BLOOD: Lipase: 32 U/L (ref 11–51)

## 2023-09-09 MED ORDER — IOHEXOL 300 MG/ML  SOLN
100.0000 mL | Freq: Once | INTRAMUSCULAR | Status: AC | PRN
  Administered 2023-09-09: 100 mL via INTRAVENOUS

## 2023-09-09 NOTE — ED Provider Notes (Signed)
Julie [name] EMERGENCY DEPARTMENT AT [facility] Provider Note   CSN: [id number] Arrival date & time: 09/09/23  1055     History  Chief Complaint  Patient presents with   Abdominal Pain    Julie [name] is a 57 y.o. female.  The history is provided by the patient and medical records. No language interpreter was used.  Abdominal Pain    57 year old female significant history of GERD, rheumatoid arthritis, hypertension, anemia, esophageal dysphagia presenting for evaluation of abdominal pain.  Patient report a month ago she had an endoscopy done by her GI specialist at [facility].  It was done due to having narrowing of her esophagus.  She did have a dilation and is scheduled to have another procedure in January.  She mention after procedure she did notice some upper abdominal discomfort which was transient.  A week later she noticed the same sensation which she described as a gradual onset of pressure sensation to her mid abdomen lasting for 40 minutes and subsequently resolved.  She thought it may be trapped gas.  However today, the same pain returns.  Pain is gradual onset, tightness and uncomfortable across the mid abdomen and became very intense.  She denies any associate lightheadedness, dizziness, chest pain, shortness of breath, nausea, vomiting, diarrhea, dysuria.  Last bowel movement was this morning.  No fever or chills no postprandial pain.  Pain lasting for approximately 1 hour and seems to be improving but not fully resolved.  Home Medications Prior to Admission medications   Medication Sig Start Date End Date Taking? Authorizing Provider  Abatacept (ORENCIA CLICKJECT) 125 MG/ML SOAJ INJECT 1 PEN SUBCUTANEOUSLY  WEEKLY 08/07/23   Gearldine Bienenstock, PA-C  albuterol (PROVENTIL) (2.5 MG/3ML) 0.083% nebulizer solution Take 3 mLs (2.5 mg total) by nebulization every 4 (four) hours as needed for wheezing or shortness of breath. 06/12/22   Alfonse Spruce, MD  albuterol  (VENTOLIN HFA) 108 (90 Base) MCG/ACT inhaler Inhale 2 puffs into the lungs every 6 (six) hours as needed for wheezing or shortness of breath. 06/12/22   Alfonse Spruce, MD  amLODipine (NORVASC) 10 MG tablet Take 10 mg by mouth daily. 12/03/22   [provider]  azelastine (ASTELIN) 0.1 % nasal spray Place 1 spray into both nostrils 2 (two) times daily. Use in each nostril as directed 06/12/22   Alfonse Spruce, MD  B-D TB SYRINGE 1CC/27GX1/2" 27G X 1/2" 1 ML MISC Use to inject Methotrexate weekly. 01/30/21   Pollyann Savoy, MD  cetirizine (ZYRTEC) 10 MG tablet Take 1 tablet (10 mg total) by mouth in the morning. TAKE 1 TABLET EVERY DAY 06/18/23   Alfonse Spruce, MD  diclofenac Sodium (VOLTAREN) 1 % GEL Apply 2g to 4g to affected area up to 4 times daily as needed. 10/29/19   Gearldine Bienenstock, PA-C  dupilumab (DUPIXENT) 300 MG/2ML prefilled syringe INJECT 300MG  SUBCUTANEOUSLY EVERY OTHER WEEK 12/31/22   Kozlow, Alvira Philips, MD  EPINEPHrine 0.3 mg/0.3 mL IJ SOAJ injection Inject 0.3 mg into the muscle as needed for anaphylaxis. 06/12/22   Alfonse Spruce, MD  famotidine (PEPCID) 40 MG/5ML suspension GIVE 5 MLS BY MOUTH TWICE A DAY 07/30/23   Alfonse Spruce, MD  fluticasone [facility]) 50 MCG/ACT nasal spray Place 1 spray into both nostrils daily. Patient taking differently: Place 1 spray into both nostrils as needed. 06/12/22   Alfonse Spruce, MD  folic acid (FOLVITE) 1 MG tablet TAKE 1 TABLET BY MOUTH TWICE  A DAY 01/24/23   Gearldine Bienenstock, PA-C  hydroxychloroquine (PLAQUENIL) 200 MG tablet TAKE 1 TABLET TWICE A DAY MONDAY -FRIDAY 06/18/23   Gearldine Bienenstock, PA-C  lansoprazole (PREVACID SOLUTAB) 30 MG disintegrating tablet Take 1 tablet (30 mg total) by mouth in the morning and at bedtime. 04/04/23 05/04/23  Alfonse Spruce, MD  levocetirizine (XYZAL) 5 MG tablet Take 1 tablet (5 mg total) by mouth at bedtime. 06/12/22   Alfonse Spruce, MD  Lifitegrast [employer] Spice) 5  % SOLN Place 1 drop into both eyes 2 times daily. Patient not taking: Reported on 04/29/2023 04/20/22   [provider]  Methotrexate, PF, (RASUVO) 15 MG/0.3ML SOAJ INJECT THE CONTENTS OF ONE  AUTO-INJECTOR SUBCUTANEOUSLY  WEEKLY AS DIRECTED 08/09/23   Gearldine Bienenstock, PA-C  montelukast (SINGULAIR) 10 MG tablet Take 1 tablet (10 mg total) by mouth at bedtime. 06/18/23   Alfonse Spruce, MD  Multiple Vitamins-Minerals (DAILY MULTIVITAMIN PO) Take 1 tablet by mouth daily.     [provider]  mupirocin ointment (BACTROBAN) 2 % Apply 1 Application topically 2 (two) times daily. Patient not taking: Reported on 04/29/2023 06/12/22   Alfonse Spruce, MD  omeprazole (PRILOSEC) 40 MG capsule Take 1 capsule (40 mg total) by mouth 2 (two) times daily. 01/01/23   Alfonse Spruce, MD  Vitamin D, Ergocalciferol, (DRISDOL) 1.25 MG (50000 UNIT) CAPS capsule Take 1 capsule (50,000 Units total) by mouth every 7 (seven) days. 02/14/23   Gearldine Bienenstock, PA-C      Allergies    Peanut-containing drug products, Aspirin, and Augmentin [amoxicillin-pot clavulanate]    Review of Systems   Review of Systems  Gastrointestinal:  Positive for abdominal pain.  All other systems reviewed and are negative.   Physical Exam Updated Vital Signs BP (!) 130/92   Pulse 84   Temp 97.9 F (36.6 C) (Oral)   Resp 18   Ht 5' 1.25" (1.556 m)   Wt 74.8 kg   LMP 05/19/2015   SpO2 99%   BMI 30.92 kg/m  Physical Exam Vitals and nursing note reviewed.  Constitutional:      General: She is not in acute distress.    Appearance: She is well-developed.  HENT:     Head: Atraumatic.  Eyes:     Conjunctiva/sclera: Conjunctivae normal.  Cardiovascular:     Rate and Rhythm: Normal rate and regular rhythm.  Pulmonary:     Effort: Pulmonary effort is normal.  Abdominal:     General: Bowel sounds are normal.     Tenderness: There is generalized abdominal tenderness (Mild diffuse tenderness without  guarding or rebound tenderness.  Bowel sounds present.).     Hernia: No hernia is present.  Musculoskeletal:     Cervical back: Neck supple.  Skin:    Findings: No rash.  Neurological:     Mental Status: She is alert.  Psychiatric:        Mood and Affect: Mood normal.     ED Results / Procedures / Treatments   Labs (all labs ordered are listed, but only abnormal results are displayed) Labs Reviewed  COMPREHENSIVE METABOLIC PANEL - Abnormal; Notable for the following components:      Result Value   Glucose, Bld 100 (*)    All other components within normal limits  CBC - Abnormal; Notable for the following components:   WBC 3.9 (*)    All other components within normal limits  URINALYSIS, ROUTINE W REFLEX MICROSCOPIC -  Abnormal; Notable for the following components:   Color, Urine STRAW (*)    APPearance HAZY (*)    Specific Gravity, Urine 1.004 (*)    All other components within normal limits  LIPASE, BLOOD    EKG None  Radiology CT ABDOMEN PELVIS W CONTRAST Result Date: 09/09/2023 CLINICAL DATA:  Acute abdominal pain beginning this morning. EXAM: CT ABDOMEN AND PELVIS WITH CONTRAST TECHNIQUE: Multidetector CT imaging of the abdomen and pelvis was performed using the standard protocol following bolus administration of intravenous contrast. RADIATION DOSE REDUCTION: This exam was performed according to the departmental dose-optimization program which includes automated exposure control, adjustment of the mA and/or kV according to patient size and/or use of iterative reconstruction technique. CONTRAST:  OMNIPAQUE IOHEXOL 300 MG/ML  SOLN COMPARISON:  11/16/2008 FINDINGS: Lower Chest: No acute findings. Hepatobiliary: 1.3 cm cyst seen in inferior right hepatic lobe. No suspicious hepatic masses identified. Gallbladder is unremarkable. No evidence of biliary ductal dilatation. Pancreas:  No mass or inflammatory changes. Spleen: Within normal limits in size and appearance.  Adrenals/Urinary Tract: No suspicious masses identified. No evidence of ureteral calculi or hydronephrosis. Stomach/Bowel: No evidence of obstruction, inflammatory process or abnormal fluid collections. Normal appendix visualized. Mild diverticulosis is seen involving the descending colon, however, there are no signs of diverticulitis. No abnormal fluid collections seen. Vascular/Lymphatic: No pathologically enlarged lymph nodes. No acute vascular findings. Reproductive: Prior hysterectomy noted. Adnexal regions are unremarkable in appearance. Other:  None. Musculoskeletal:  No suspicious bone lesions identified. IMPRESSION: No acute findings. Mild left colonic diverticulosis, without radiographic evidence of diverticulitis. Electronically Signed   By: Danae Orleans M.D.   On: 09/09/2023 15:24    Procedures Procedures    Medications Ordered in ED Medications  iohexol (OMNIPAQUE) 300 MG/ML solution 100 mL (100 mLs Intravenous Contrast Given 09/09/23 1448)    ED Course/ Medical Decision Making/ A&P                                 Medical Decision Making Amount and/or Complexity of Data Reviewed Labs: ordered. Radiology: ordered.  Risk Prescription drug management.   BP (!) 130/92   Pulse 84   Temp 97.9 F (36.6 C) (Oral)   Resp 18   Ht 5' 1.25" (1.556 m)   Wt 74.8 kg   LMP 05/19/2015   SpO2 99%   BMI 30.92 kg/m   68:56 PM 57 year old female significant history of GERD, rheumatoid arthritis, hypertension, anemia, esophageal dysphagia presenting for evaluation of abdominal pain.  Patient report a month ago she had an endoscopy done by her GI specialist at [facility].  It was done due to having narrowing of her esophagus.  She did have a dilation and is scheduled to have another procedure in January.  She mention after procedure she did notice some upper abdominal discomfort which was transient.  A week later she noticed the same sensation which she described as a gradual onset of  pressure sensation to her mid abdomen lasting for 40 minutes and subsequently resolved.  She thought it may be trapped gas.  However today, the same pain returns.  Pain is gradual onset, tightness and uncomfortable across the mid abdomen and became very intense.  She denies any associate lightheadedness, dizziness, chest pain, shortness of breath, nausea, vomiting, diarrhea, dysuria.  Last bowel movement was this morning.  No fever or chills no postprandial pain.  Pain lasting for approximately 1 hour and  seems to be improving but not fully resolved.  Exam overall reassuring, patient is resting comfortably appears to be in no acute discomfort.  She does have some mild generalized tenderness without focal point tenderness.  Bowel sounds present.  -Labs ordered, independently viewed and interpreted by me.  Labs remarkable for reassuring value -The patient was maintained on a cardiac monitor.  I personally viewed and interpreted the cardiac monitored which showed an underlying rhythm of: NSR -Imaging independently viewed and interpreted by me and I agree with radiologist's interpretation.  Result remarkable for abd/pelvis CT without acute changes -This patient presents to the ED for concern of abd pain, this involves an extensive number of treatment options, and is a complaint that carries with it a high risk of complications and morbidity.  The differential diagnosis includes colitis, diverticulitis, pancreatitis, appendicitis, cholecystitis, GERD, gastritis, UTI -Co morbidities that complicate the patient evaluation includes esophageal dysphagia, GERD, HTN -Treatment includes monitoring -Reevaluation of the patient after these medicines showed that the patient improved -PCP office notes or outside notes reviewed -Escalation to admission/observation considered: patients feels much better, is comfortable with discharge, and will follow up with PCP -Prescription medication considered, patient comfortable  with OTC meds -Social Determinant of Health considered          Final Clinical Impression(s) / ED Diagnoses Final diagnoses:  Generalized abdominal pain    Rx / DC Orders ED Discharge Orders     None         Fayrene Helper, PA-C 09/09/23 1535    Pricilla Loveless, MD 09/12/23 0730

## 2023-09-09 NOTE — ED Triage Notes (Signed)
Patient has lower centralized dull abdominal pain since 10am. No vomiting or diarrhea.

## 2023-09-09 NOTE — Discharge Instructions (Signed)
You have been evaluated for your symptoms.  Fortunately your labs and CT scan did not show any concerning finding.  Please call and follow-up closely with your primary care doctor and with your GI specialist for outpatient managements of your condition.  Return if you have any concern.

## 2023-09-09 NOTE — ED Notes (Signed)
Patient transported to CT 

## 2023-09-17 ENCOUNTER — Telehealth: Payer: Self-pay | Admitting: *Deleted

## 2023-09-17 NOTE — Telephone Encounter (Signed)
 Received fax from [facility] stating they have been trying to reach patient to refill Orencia . They have been unsuccessful in reaching patient. Call back number is [phone]. Patient advised to contact the pharmacy to set up shipment.  Patient states she does not need to refill at this time because she has several doses on hand. Patient advised to reach out to [facility] to advise.

## 2023-09-17 NOTE — Progress Notes (Deleted)
 Office Visit Note  Patient: Julie [name]             Date of Birth: 1966/04/13           MRN: [id number]             PCP: Ilah Crigler, MD Referring: Ilah Crigler, MD Visit Date: 10/01/2023 Occupation: @GUAROCC @  Subjective:  No chief complaint on file.   History of Present Illness: Julie [name] is a 57 y.o. female ***     Activities of Daily Living:  Patient reports morning stiffness for *** {minute/hour:19697}.   Patient {ACTIONS;DENIES/REPORTS:21021675::Denies} nocturnal pain.  Difficulty dressing/grooming: {ACTIONS;DENIES/REPORTS:21021675::Denies} Difficulty climbing stairs: {ACTIONS;DENIES/REPORTS:21021675::Denies} Difficulty getting out of chair: {ACTIONS;DENIES/REPORTS:21021675::Denies} Difficulty using hands for taps, buttons, cutlery, and/or writing: {ACTIONS;DENIES/REPORTS:21021675::Denies}  No Rheumatology ROS completed.   PMFS History:  Patient Active Problem List   Diagnosis Date Noted   Witnessed episode of apnea 01/07/2022   Viral illness 09/08/2021   Anaphylactic shock due to adverse food reaction 02/01/2020   Moderate persistent asthma without complication 11/18/2019   Complication of anesthesia    Fibromyalgia 02/14/2018   History of gastroesophageal reflux (GERD) 02/14/2018   History of asthma 02/14/2018   Rheumatoid arthritis involving multiple sites with positive rheumatoid factor (HCC) 11/05/2016   High risk medication use 11/05/2016   LPRD (laryngopharyngeal reflux disease) 09/20/2015   Seasonal and perennial allergic rhinoconjunctivitis 09/20/2015   Acute sinusitis 07/29/2015   S/P total hysterectomy 05/25/2015    Past Medical History:  Diagnosis Date   Anemia    Asthma    Complication of anesthesia    Eczema    Esophageal dysphagia    GERD (gastroesophageal reflux disease)    Hypertension    PONV (postoperative nausea and vomiting)    Rheumatoid arthritis (HCC)     Family History  Problem Relation Age of Onset    Asthma Mother    Eczema Mother    Sarcoidosis Mother    Hypertension Mother    Heart attack Mother    Cancer Father    Hypotension Father    Congestive Heart Failure Father    Colon cancer Father    Multiple myeloma Father    Asthma Sister    GER disease Sister    Lung cancer Maternal Grandfather        smoked   Allergic rhinitis Neg Hx    Angioedema Neg Hx    Immunodeficiency Neg Hx    Urticaria Neg Hx    Esophageal cancer Neg Hx    Rectal cancer Neg Hx    Stomach cancer Neg Hx    Past Surgical History:  Procedure Laterality Date   ABDOMINAL HYSTERECTOMY     partial age 83   BILATERAL SALPINGECTOMY Bilateral 05/25/2015   Procedure: BILATERAL SALPINGECTOMY;  Surgeon: Dickie Carder, MD;  Location: [facility];  Service: Gynecology;  Laterality: Bilateral;   CARPAL TUNNEL RELEASE Left 01/05/2020   Procedure: LEFT CARPAL TUNNEL RELEASE;  Surgeon: Murrell Kuba, MD;  Location: [facility];  Service: Orthopedics;  Laterality: Left;  FOREARM BIER BLOCK   COLONOSCOPY     OVARIAN CYST REMOVAL Right 05/25/2015   Procedure: OVARIAN CYSTECTOMY;  Surgeon: Dickie Carder, MD;  Location: [facility];  Service: Gynecology;  Laterality: Right;   TONSILLECTOMY     TUBAL LIGATION     Social History   Social History Narrative   Right handed    Caffeine- 2 cups per day    Immunization History  Administered Date(s) Administered  Influenza Inj Mdck Quad Pf 10/19/2017   Influenza Split 11/25/2014   Influenza, Quadrivalent, Recombinant, Inj, Pf 08/26/2019   Influenza, Seasonal, Injecte, Preservative Fre 09/20/2015   Influenza,inj,Quad PF,6+ Mos 08/04/2018   Influenza-Unspecified 07/18/2017   PFIZER(Purple Top)SARS-COV-2 Vaccination 11/28/2019, 12/19/2019, 06/23/2020     Objective: Vital Signs: LMP 05/19/2015    Physical Exam   Musculoskeletal Exam: ***  CDAI Exam: CDAI Score: -- Patient Global: --; Provider Global: -- Swollen: --; Tender: -- Joint Exam 10/01/2023    No joint exam has been documented for this visit   There is currently no information documented on the homunculus. Go to the Rheumatology activity and complete the homunculus joint exam.  Investigation: No additional findings.  Imaging: CT ABDOMEN PELVIS W CONTRAST Result Date: 09/09/2023 CLINICAL DATA:  Acute abdominal pain beginning this morning. EXAM: CT ABDOMEN AND PELVIS WITH CONTRAST TECHNIQUE: Multidetector CT imaging of the abdomen and pelvis was performed using the standard protocol following bolus administration of intravenous contrast. RADIATION DOSE REDUCTION: This exam was performed according to the departmental dose-optimization program which includes automated exposure control, adjustment of the mA and/or kV according to patient size and/or use of iterative reconstruction technique. CONTRAST:  OMNIPAQUE  IOHEXOL  300 MG/ML  SOLN COMPARISON:  11/16/2008 FINDINGS: Lower Chest: No acute findings. Hepatobiliary: 1.3 cm cyst seen in inferior right hepatic lobe. No suspicious hepatic masses identified. Gallbladder is unremarkable. No evidence of biliary ductal dilatation. Pancreas:  No mass or inflammatory changes. Spleen: Within normal limits in size and appearance. Adrenals/Urinary Tract: No suspicious masses identified. No evidence of ureteral calculi or hydronephrosis. Stomach/Bowel: No evidence of obstruction, inflammatory process or abnormal fluid collections. Normal appendix visualized. Mild diverticulosis is seen involving the descending colon, however, there are no signs of diverticulitis. No abnormal fluid collections seen. Vascular/Lymphatic: No pathologically enlarged lymph nodes. No acute vascular findings. Reproductive: Prior hysterectomy noted. Adnexal regions are unremarkable in appearance. Other:  None. Musculoskeletal:  No suspicious bone lesions identified. IMPRESSION: No acute findings. Mild left colonic diverticulosis, without radiographic evidence of diverticulitis.  Electronically Signed   By: Norleen DELENA Kil M.D.   On: 09/09/2023 15:24    Recent Labs: Lab Results  Component Value Date   WBC 3.9 (L) 09/09/2023   HGB 12.5 09/09/2023   PLT 214 09/09/2023   NA 137 09/09/2023   K 3.7 09/09/2023   CL 103 09/09/2023   CO2 27 09/09/2023   GLUCOSE 100 (H) 09/09/2023   BUN 15 09/09/2023   CREATININE 0.67 09/09/2023   BILITOT 0.6 09/09/2023   ALKPHOS 65 09/09/2023   AST 15 09/09/2023   ALT 15 09/09/2023   PROT 7.4 09/09/2023   ALBUMIN 4.1 09/09/2023   CALCIUM  8.9 09/09/2023   GFRAA 118 03/16/2021   QFTBGOLDPLUS NEGATIVE 08/07/2023    Speciality Comments: PLQ Eye Exam: 12/21/2022  [facility] Follow up in 1 year  Procedures:  No procedures performed Allergies: Peanut-containing drug products, Aspirin, and Augmentin  [amoxicillin -pot clavulanate]   Assessment / Plan:     Visit Diagnoses: Rheumatoid arthritis involving multiple sites with positive rheumatoid factor (HCC)  High risk medication use  Anti-RNP antibodies present  Trigger middle finger of right hand  Fibromyalgia  Positive ANA (antinuclear antibody)  Sicca syndrome (HCC)  Osteopenia of spine  Vitamin D  deficiency  History of Helicobacter pylori infection  History of asthma  History of gastroesophageal reflux (GERD)  Lichen planus  History of depression  Abnormal SPEP  Orders: No orders of the defined  types were placed in this encounter.  No orders of the defined types were placed in this encounter.   Face-to-face time spent with patient was *** minutes. Greater than 50% of time was spent in counseling and coordination of care.  Follow-Up Instructions: No follow-ups on file.   Waddell CHRISTELLA Craze, PA-C  Note - This record has been created using Dragon software.  Chart creation errors have been sought, but may not always  have been located. Such creation errors do not reflect on  the standard of medical care.

## 2023-09-24 ENCOUNTER — Ambulatory Visit: Payer: 59 | Admitting: Allergy & Immunology

## 2023-09-24 ENCOUNTER — Encounter: Payer: Self-pay | Admitting: Allergy & Immunology

## 2023-09-24 ENCOUNTER — Other Ambulatory Visit: Payer: Self-pay

## 2023-09-24 ENCOUNTER — Ambulatory Visit: Payer: 59

## 2023-09-24 VITALS — BP 112/70 | HR 81 | Temp 98.5°F | Resp 18

## 2023-09-24 DIAGNOSIS — K219 Gastro-esophageal reflux disease without esophagitis: Secondary | ICD-10-CM

## 2023-09-24 DIAGNOSIS — R490 Dysphonia: Secondary | ICD-10-CM | POA: Diagnosis not present

## 2023-09-24 DIAGNOSIS — J3089 Other allergic rhinitis: Secondary | ICD-10-CM

## 2023-09-24 DIAGNOSIS — J302 Other seasonal allergic rhinitis: Secondary | ICD-10-CM

## 2023-09-24 DIAGNOSIS — H1010 Acute atopic conjunctivitis, unspecified eye: Secondary | ICD-10-CM

## 2023-09-24 DIAGNOSIS — Z23 Encounter for immunization: Secondary | ICD-10-CM

## 2023-09-24 DIAGNOSIS — J387 Other diseases of larynx: Secondary | ICD-10-CM

## 2023-09-24 DIAGNOSIS — U099 Post covid-19 condition, unspecified: Secondary | ICD-10-CM

## 2023-09-24 DIAGNOSIS — J4540 Moderate persistent asthma, uncomplicated: Secondary | ICD-10-CM | POA: Diagnosis not present

## 2023-09-24 MED ORDER — EPINEPHRINE 0.3 MG/0.3ML IJ SOAJ: 0.3000 mg | INTRAMUSCULAR | 2 refills | Status: DC | PRN

## 2023-09-24 MED ORDER — LANSOPRAZOLE 30 MG PO TBDD: 30.0000 mg | DELAYED_RELEASE_TABLET | Freq: Two times a day (BID) | ORAL | 1 refills | Status: DC

## 2023-09-24 MED ORDER — FAMOTIDINE 40 MG/5ML PO SUSR: ORAL | 0 refills | Status: DC

## 2023-09-24 MED ORDER — MONTELUKAST SODIUM 10 MG PO TABS: 10.0000 mg | ORAL_TABLET | Freq: Every evening | ORAL | 1 refills | Status: DC

## 2023-09-24 NOTE — Progress Notes (Signed)
 FOLLOW UP  Date of Service/Encounter:  09/24/23   Assessment:   Chronic rhinosinusitis (grasses, weeds, trees, indoor and outdoor molds, cat, dog, dust mite) - via blood work in March 2021  Peeling nose - now healed     Moderate persistent asthma - with worsening symptoms since contracting COVID19 earlier in the year   Rheumatoid arthritis - on MTX, folic acid , and Plaquenil    Difficulty using the Dupixent  autoinjector due to coexisting RA (receives injections in our office)   History of migraines   Xerostomia - with positive ENA RNP Ab   Long COVID syndrome   Monoclonal gammopathy of unknown significance - followed by Dr. Loretha  Newly diagnosed lichen planus    Plan/Recommendations:   1. Moderate persistent asthma - complicated with recent COVID19 infection - Lung testing looks awesome today. - Daily controller medication(s): Singulair  (montelukast ) 10mg  daily and Dupixent  300mg  every 2 weeks  - Prior to physical activity: albuterol  2 puffs 10-15 minutes before physical activity - Rescue medications: albuterol  4 puffs every 4-6 hours as needed or albuterol  nebulizer one vial puffs every 4-6 hours as needed  - Asthma control goals:  * Full participation in all desired activities (may need albuterol  before activity) * Albuterol  use two time or less a week on average (not counting use with activity) * Cough interfering with sleep two time or less a month * Oral steroids no more than once a year * No hospitalizations  2. LPRD (laryngopharyngeal reflux disease) - Hold the omeprazole  TWICE DAILY and start lansoprazole  dissolving tables TWICE DAILY instead.  - I sent the prior authorization info with the prescription.  - Continue with Pepcid  (famotidine ) 40mg  twice daily in liquid form. - I can't wait until this next endoscopy and what that shows. - I hope we can get you back to normal.   3. Seasonal allergic rhinitis - Continue with alternating antihistamines daily  (change every 2-3 months). - Continue to hold the nose sprays until you feel that you need them again.  - Continue with Ayr nasal saline gel.   4. Food allergies - Continue to avoid peanuts and tree nuts.  5. Return in about 6 months (around 03/23/2024). You can have the follow up appointment with Dr. Iva or a Nurse Practicioner (our Nurse Practitioners are excellent and always have Physician oversight!).   Subjective:   Julie [name] is a 58 y.o. female presenting today for follow up of  Chief Complaint  Patient presents with   Follow-up   Asthma    Julie [name] has a history of the following: Patient Active Problem List   Diagnosis Date Noted   Witnessed episode of apnea 01/07/2022   Viral illness 09/08/2021   Anaphylactic shock due to adverse food reaction 02/01/2020   Moderate persistent asthma without complication 11/18/2019   Complication of anesthesia    Fibromyalgia 02/14/2018   History of gastroesophageal reflux (GERD) 02/14/2018   History of asthma 02/14/2018   Rheumatoid arthritis involving multiple sites with positive rheumatoid factor (HCC) 11/05/2016   High risk medication use 11/05/2016   LPRD (laryngopharyngeal reflux disease) 09/20/2015   Seasonal and perennial allergic rhinoconjunctivitis 09/20/2015   Acute sinusitis 07/29/2015   S/P total hysterectomy 05/25/2015    History obtained from: chart review and patient.  Discussed the use of AI scribe software for clinical note transcription with the patient and/or guardian, who gave verbal consent to proceed.  Julie [name] is a 58 y.o. female presenting for a follow up visit.  She was last seen in July 2024.  At that time, we did not do lung testing.  We continue with albuterol  neb solution 1-2 times daily as well as Dupixent .  Her milligrams every 2 weeks.  Her breathing was complicated by vocal cord dysfunction.  For her reflux, we held the omeprazole  and started lansoprazole  twice a day instead.  She was  having trouble with having the omeprazole  get caught in her throat.  Will continue with the Pepcid  40 mg twice daily.  For her allergic rhinitis, she continue with her alternating antihistamines as well as Astelin .  Since the last visit, she has done fairly well from a breathing perspective.   The patient, with a history of long COVID, lichen planus, and autoimmune conditions, presents with ongoing issues related to her throat.   Asthma/Respiratory Symptom History: She feels that she has been able to decrease use of her albuterol .  She was using it twice a day every day the last time we saw her, but now she will go several days without using it.  She remains on the Dupixent  every 2 weeks which seems to be controlling her symptoms very effectively.  She has not been on prednisone  for her breathing since we saw her last time.  She seems to be doing fairly well from a breathing perspective.  Allergic Rhinitis Symptom History: The patient also reports recent changes in her nasal health, with cessation of previous dryness and bleeding. She attributes this improvement to the application of an antibiotic ointment.  She is not using her nasal sprays at all right now.  She stopped using them when she had irritation to her nasal cavity, but has never really restarted them.  She has not been on antibiotics since we saw her last time.  Overall, she seems to be doing fairly well.  She has not been to the emergency room or urgent care for her symptoms.  Food Allergy Symptom History: She continues to avoid peanuts and tree nuts.  Testing has been negative in the past, most recently July 2024.  GERD Symptom History: She remains on her reflux medications including liquid Pepcid  and the omeprazole  capsule.  She reports having undergone multiple throat stretchings since her last visit in July, with the most recent procedure scheduled for February. Despite these interventions, the patient's throat remains significantly  narrowed, causing difficulty swallowing and necessitating modifications to her medication administration. She has been prescribed a low dose of prednisone  daily to manage the lichen planus, in addition to her existing regimen of Orencia  and methotrexate  for her RA. She has been told that this is all related to her propensity to develop autoimmune diseases.   The patient's personal life has been marked by significant stressors, including a pending divorce and family health crises. Despite these challenges, her blood pressure has remained stable. She expresses a desire to eventually discontinue her amlodipine  medication, which she believes was initiated due to menopausal changes.   The patient's overall health appears to be slowly improving, with a decrease in the severity of long COVID symptoms and stabilization of her autoimmune conditions. However, the persistent throat issues and the financial burden of the necessary procedures continue to be significant sources of distress.   She remains on her RA medications including Orenicia as well as methotrexate  as well as hydroxychloroquine  twice a day. She is also on prednisone  2.5 mg daily to treat her newly diagnosed lichen planus. Her next stretching is February 13th. She is following with Dr. Jorrizo.  Otherwise, there have been no changes to her past medical history, surgical history, family history, or social history.    Review of systems otherwise negative other than that mentioned in the HPI.    Objective:   Blood pressure 112/70, pulse 81, temperature 98.5 F (36.9 C), temperature source Temporal, resp. rate 18, last menstrual period 05/19/2015, SpO2 99%. There is no height or weight on file to calculate BMI.    Physical Exam Vitals reviewed.  Constitutional:      Appearance: Normal appearance. She is well-developed.     Comments: Smiling. Seems more upbeat today.   HENT:     Head: Normocephalic and atraumatic.     Right Ear:  Tympanic membrane, ear canal and external ear normal.     Left Ear: Tympanic membrane, ear canal and external ear normal.     Nose: No nasal deformity, septal deviation, mucosal edema or rhinorrhea.     Right Turbinates: Enlarged, swollen and pale.     Left Turbinates: Enlarged, swollen and pale.     Right Sinus: No maxillary sinus tenderness or frontal sinus tenderness.     Left Sinus: No maxillary sinus tenderness or frontal sinus tenderness.     Comments: No nasal polyps noted.     Mouth/Throat:     Lips: Pink.     Mouth: Mucous membranes are moist. Mucous membranes are not pale and not dry.     Pharynx: Uvula midline. Posterior oropharyngeal erythema present. No pharyngeal swelling or oropharyngeal exudate.     Comments: Cobblestoning present in the posterior oropharynx.  Eyes:     General: Lids are normal. Allergic shiner present.        Right eye: No discharge.        Left eye: No discharge.     Conjunctiva/sclera: Conjunctivae normal.     Right eye: Right conjunctiva is not injected. No chemosis.    Left eye: Left conjunctiva is not injected. No chemosis.    Pupils: Pupils are equal, round, and reactive to light.  Cardiovascular:     Rate and Rhythm: Normal rate and regular rhythm.     Heart sounds: Normal heart sounds.  Pulmonary:     Effort: Pulmonary effort is normal. No tachypnea, accessory muscle usage or respiratory distress.     Breath sounds: Normal breath sounds. No wheezing, rhonchi or rales.     Comments: Decreased air movement in the bases. Hoarse voice.  Chest:     Chest wall: No tenderness.  Lymphadenopathy:     Cervical: No cervical adenopathy.  Skin:    Coloration: Skin is not pale.     Findings: No abrasion, erythema, petechiae or rash. Rash is not papular, urticarial or vesicular.  Neurological:     Mental Status: She is alert.  Psychiatric:        Behavior: Behavior is cooperative.      Diagnostic studies:    Spirometry: results normal (FEV1:  1.69/82%, FVC: 2.16/85%, FEV1/FVC: 78%).    Spirometry consistent with normal pattern.   Allergy Studies: none        Marty Shaggy, MD  [facility] 

## 2023-09-24 NOTE — Patient Instructions (Addendum)
 1. Moderate persistent asthma - complicated with recent COVID19 infection - Lung testing looks awesome today. - Daily controller medication(s): Singulair  (montelukast ) 10mg  daily and Dupixent  300mg  every 2 weeks  - Prior to physical activity: albuterol  2 puffs 10-15 minutes before physical activity - Rescue medications: albuterol  4 puffs every 4-6 hours as needed or albuterol  nebulizer one vial puffs every 4-6 hours as needed  - Asthma control goals:  * Full participation in all desired activities (may need albuterol  before activity) * Albuterol  use two time or less a week on average (not counting use with activity) * Cough interfering with sleep two time or less a month * Oral steroids no more than once a year * No hospitalizations  2. LPRD (laryngopharyngeal reflux disease) - Hold the omeprazole  TWICE DAILY and start lansoprazole  dissolving tables TWICE DAILY instead.  - I sent the prior authorization info with the prescription.  - Continue with Pepcid  (famotidine ) 40mg  twice daily in liquid form. - I can't wait until this next endoscopy and what that shows. - I hope we can get you back to normal.   3. Seasonal allergic rhinitis - Continue with alternating antihistamines daily (change every 2-3 months). - Continue to hold the nose sprays until you feel that you need them again.  - Continue with Ayr nasal saline gel.   4. Food allergies - Continue to avoid peanuts and tree nuts.  5. Return in about 6 months (around 03/23/2024). You can have the follow up appointment with Dr. Iva or a Nurse Practicioner (our Nurse Practitioners are excellent and always have Physician oversight!).    Please inform us  of any Emergency Department visits, hospitalizations, or changes in symptoms. Call us  before going to the ED for breathing or allergy symptoms since we might be able to fit you in for a sick visit. Feel free to contact us  anytime with any questions, problems, or concerns.  It was a  pleasure to see you and your family again today!  Websites that have reliable patient information: 1. American Academy of Asthma, Allergy, and Immunology: www.aaaai.org 2. Food Allergy Research and Education (FARE): foodallergy.org 3. Mothers of Asthmatics: http://www.asthmacommunitynetwork.org 4. American College of Allergy, Asthma, and Immunology: www.acaai.org      "Like" us  on Facebook and Instagram for our latest updates!      A healthy democracy works best when [employer] participate! Make sure you are registered to vote! If you have moved or changed any of your contact information, you will need to get this updated before voting! Scan the QR codes below to learn more!       Influenza (Flu) Vaccine (Inactivated or Recombinant): What You Need to Know Many vaccine information statements are available in Spanish and other languages. See promoage.com.br. 1. Why get vaccinated? Influenza vaccine can prevent influenza (flu). Flu is a contagious disease that spreads around the [place]  every year, usually between October and May. Anyone can get the flu, but it is more dangerous for some people. Infants and young children, people 67 years and older, pregnant people, and people with certain health conditions or a weakened immune system are at greatest risk of flu complications. Pneumonia, bronchitis, sinus infections, and ear infections are examples of flu-related complications. If you have a medical condition, such as heart disease, cancer, or diabetes, flu can make it worse. Flu can cause fever and chills, sore throat, muscle aches, fatigue, cough, headache, and runny or stuffy nose. Some people may have vomiting and diarrhea, though this  is more common in children than adults. In an average year, thousands of people in the [place]  die from flu, and many more are hospitalized. Flu vaccine prevents millions of illnesses and flu-related visits to the doctor each year. 2.  Influenza vaccines CDC recommends everyone 6 months and older get vaccinated every flu season. Children 6 months through 64 years of age may need 2 doses during a single flu season. Everyone else needs only 1 dose each flu season. It takes about 2 weeks for protection to develop after vaccination. There are many flu viruses, and they are always changing. Each year a new flu vaccine is made to protect against the influenza viruses believed to be likely to cause disease in the upcoming flu season. Even when the vaccine doesn't exactly match these viruses, it may still provide some protection. Influenza vaccine does not cause flu. Influenza vaccine may be given at the same time as other vaccines. 3. Talk with your health care provider Tell your vaccination provider if the person getting the vaccine: Has had an allergic reaction after a previous dose of influenza vaccine, or has any severe, life-threatening allergies Has ever had Guillain-Barr Syndrome (also called GBS) In some cases, your health care provider may decide to postpone influenza vaccination until a future visit. Influenza vaccine can be administered at any time during pregnancy. People who are or will be pregnant during influenza season should receive inactivated influenza vaccine. People with minor illnesses, such as a cold, may be vaccinated. People who are moderately or severely ill should usually wait until they recover before getting influenza vaccine. Your health care provider can give you more information. 4. Risks of a vaccine reaction Soreness, redness, and swelling where the shot is given, fever, muscle aches, and headache can happen after influenza vaccination. There may be a very small increased risk of Guillain-Barr Syndrome (GBS) after inactivated influenza vaccine (the flu shot). Young children who get the flu shot along with pneumococcal vaccine (PCV13) and/or DTaP vaccine at the same time might be slightly more likely  to have a seizure caused by fever. Tell your health care provider if a child who is getting flu vaccine has ever had a seizure. People sometimes faint after medical procedures, including vaccination. Tell your provider if you feel dizzy or have vision changes or ringing in the ears. As with any medicine, there is a very remote chance of a vaccine causing a severe allergic reaction, other serious injury, or death. 5. What if there is a serious problem? An allergic reaction could occur after the vaccinated person leaves the clinic. If you see signs of a severe allergic reaction (hives, swelling of the face and throat, difficulty breathing, a fast heartbeat, dizziness, or weakness), call 9-1-1 and get the person to the nearest hospital. For other signs that concern you, call your health care provider. Adverse reactions should be reported to the Vaccine Adverse Event Reporting System (VAERS). Your health care provider will usually file this report, or you can do it yourself. Visit the VAERS website at www.vaers.lagents.no or call 617-647-4818. VAERS is only for reporting reactions, and VAERS staff members do not give medical advice. 6. The National Vaccine Injury Compensation Program The [employer] Vaccine Injury Compensation Program (VICP) is a federal program that was created to compensate people who may have been injured by certain vaccines. Claims regarding alleged injury or death due to vaccination have a time limit for filing, which may be as short as two years. Visit the VICP  website at spiritualword.at or call (832) 005-2130 to learn about the program and about filing a claim. 7. How can I learn more? Ask your health care provider. Call your local or state health department. Visit the website of the Food and Drug Administration (FDA) for vaccine package inserts and additional information at finderlist.no. Contact the Centers for Disease Control and  Prevention (CDC): Call (684) 194-8605 (1-800-CDC-INFO) or Visit CDC's website at biotechroom.com.cy. Source: CDC Vaccine Information Statement Inactivated Influenza Vaccine (04/22/2020) This same material is available at footballexhibition.com.br for no charge. This information is not intended to replace advice given to you by your health care provider. Make sure you discuss any questions you have with your health care provider. Document Revised: 12/19/2022 Document Reviewed: 09/24/2022 Elsevier Patient Education  2024 [employer].

## 2023-10-01 ENCOUNTER — Other Ambulatory Visit (HOSPITAL_COMMUNITY): Payer: Self-pay

## 2023-10-01 ENCOUNTER — Ambulatory Visit: Payer: 59 | Admitting: Physician Assistant

## 2023-10-01 DIAGNOSIS — M0579 Rheumatoid arthritis with rheumatoid factor of multiple sites without organ or systems involvement: Secondary | ICD-10-CM

## 2023-10-01 DIAGNOSIS — Z8619 Personal history of other infectious and parasitic diseases: Secondary | ICD-10-CM

## 2023-10-01 DIAGNOSIS — Z8709 Personal history of other diseases of the respiratory system: Secondary | ICD-10-CM

## 2023-10-01 DIAGNOSIS — E559 Vitamin D deficiency, unspecified: Secondary | ICD-10-CM

## 2023-10-01 DIAGNOSIS — Z8659 Personal history of other mental and behavioral disorders: Secondary | ICD-10-CM

## 2023-10-01 DIAGNOSIS — M797 Fibromyalgia: Secondary | ICD-10-CM

## 2023-10-01 DIAGNOSIS — M8588 Other specified disorders of bone density and structure, other site: Secondary | ICD-10-CM

## 2023-10-01 DIAGNOSIS — R778 Other specified abnormalities of plasma proteins: Secondary | ICD-10-CM

## 2023-10-01 DIAGNOSIS — R768 Other specified abnormal immunological findings in serum: Secondary | ICD-10-CM

## 2023-10-01 DIAGNOSIS — M65331 Trigger finger, right middle finger: Secondary | ICD-10-CM

## 2023-10-01 DIAGNOSIS — Z79899 Other long term (current) drug therapy: Secondary | ICD-10-CM

## 2023-10-01 DIAGNOSIS — L439 Lichen planus, unspecified: Secondary | ICD-10-CM

## 2023-10-01 DIAGNOSIS — Z8719 Personal history of other diseases of the digestive system: Secondary | ICD-10-CM

## 2023-10-01 DIAGNOSIS — M3500 Sicca syndrome, unspecified: Secondary | ICD-10-CM

## 2023-10-01 MED ORDER — LANSOPRAZOLE 30 MG PO TBDD
30.0000 mg | DELAYED_RELEASE_TABLET | Freq: Two times a day (BID) | ORAL | 1 refills | Status: DC
  Filled 2023-10-01: qty 60, 30d supply, fill #0

## 2023-10-02 ENCOUNTER — Other Ambulatory Visit (HOSPITAL_COMMUNITY): Payer: Self-pay

## 2023-10-03 NOTE — Progress Notes (Unsigned)
Office Visit Note  Patient: Julie [name]             Date of Birth: 07-Nov-1965           MRN: [id number]             PCP: Leilani Able, MD Referring: Leilani Able, MD Visit Date: 10/09/2023 Occupation: @GUAROCC @  Subjective:  Medication monitoring  History of Present Illness: Julie JAELEE GEREN is a 58 y.o. female with history of seropositive rheumatoid arthritis and osteoarthritis.  Patient remains on Orencia 125 mg sq injections once weekly, Rasuvo 15 mg sq inj weekly, Plaquenil 200 mg 1 tablet BID Monday-Friday.  Patient denies any recent rheumatoid arthritis flares.  She has not been experiencing any morning stiffness, nocturnal pain, or difficulty with ADLs.  She denies any joint swelling at this time.  Patient has been under tremendous amount of stress caring for both of her parents and her husband who has had several recent surgeries.  Patient states she has had difficulty sleeping at night due to the stress she has been under. Patient is currently taking prednisone 2.5 mg daily for management of esophageal lichen planus.   She denies any other new medical conditions.  She denies any recent or recurrent infections.    Activities of Daily Living:  Patient reports morning stiffness for 0 minutes.   Patient Denies nocturnal pain.  Difficulty dressing/grooming: Denies Difficulty climbing stairs: Denies Difficulty getting out of chair: Denies Difficulty using hands for taps, buttons, cutlery, and/or writing: Denies  Review of Systems  Constitutional:  Negative for fatigue.  HENT:  Positive for mouth dryness and nose dryness. Negative for mouth sores.   Eyes:  Positive for dryness. Negative for pain.  Respiratory:  Negative for shortness of breath and difficulty breathing.   Cardiovascular:  Negative for chest pain and palpitations.  Gastrointestinal:  Negative for blood in stool, constipation and diarrhea.  Endocrine: Negative for increased urination.  Genitourinary:   Negative for involuntary urination.  Musculoskeletal:  Negative for joint pain, gait problem, joint pain, joint swelling, myalgias, muscle weakness, morning stiffness, muscle tenderness and myalgias.  Skin:  Negative for color change, rash, hair loss and sensitivity to sunlight.  Allergic/Immunologic: Negative for susceptible to infections.  Neurological:  Negative for dizziness, numbness and headaches.  Hematological:  Negative for swollen glands.  Psychiatric/Behavioral:  Positive for sleep disturbance. Negative for depressed mood. The patient is nervous/anxious.     PMFS History:  Patient Active Problem List   Diagnosis Date Noted   Witnessed episode of apnea 01/07/2022   Viral illness 09/08/2021   Anaphylactic shock due to adverse food reaction 02/01/2020   Moderate persistent asthma without complication 11/18/2019   Complication of anesthesia    Fibromyalgia 02/14/2018   History of gastroesophageal reflux (GERD) 02/14/2018   History of asthma 02/14/2018   Rheumatoid arthritis involving multiple sites with positive rheumatoid factor (HCC) 11/05/2016   High risk medication use 11/05/2016   LPRD (laryngopharyngeal reflux disease) 09/20/2015   Seasonal and perennial allergic rhinoconjunctivitis 09/20/2015   Acute sinusitis 07/29/2015   S/P total hysterectomy 05/25/2015    Past Medical History:  Diagnosis Date   Anemia    Asthma    Complication of anesthesia    Eczema    Esophageal dysphagia    GERD (gastroesophageal reflux disease)    Hypertension    Lichen planus 2024   esophageal   PONV (postoperative nausea and vomiting)    Rheumatoid arthritis (HCC)  Family History  Problem Relation Age of Onset   Asthma Mother    Eczema Mother    Sarcoidosis Mother    Hypertension Mother    Heart attack Mother    Cancer Father    Hypotension Father    Congestive Heart Failure Father    Colon cancer Father    Multiple myeloma Father    Asthma Sister    GER disease Sister     Lung cancer Maternal Grandfather        smoked   Allergic rhinitis Neg Hx    Angioedema Neg Hx    Immunodeficiency Neg Hx    Urticaria Neg Hx    Esophageal cancer Neg Hx    Rectal cancer Neg Hx    Stomach cancer Neg Hx    Past Surgical History:  Procedure Laterality Date   ABDOMINAL HYSTERECTOMY     partial age 62   BILATERAL SALPINGECTOMY Bilateral 05/25/2015   Procedure: BILATERAL SALPINGECTOMY;  Surgeon: Maxie Better, MD;  Location: [facility];  Service: Gynecology;  Laterality: Bilateral;   CARPAL TUNNEL RELEASE Left 01/05/2020   Procedure: LEFT CARPAL TUNNEL RELEASE;  Surgeon: Cindee Salt, MD;  Location: [facility];  Service: Orthopedics;  Laterality: Left;  FOREARM BIER BLOCK   COLONOSCOPY     ESOPHAGEAL DILATION     x3 in 2024   OVARIAN CYST REMOVAL Right 05/25/2015   Procedure: OVARIAN CYSTECTOMY;  Surgeon: Maxie Better, MD;  Location: [facility];  Service: Gynecology;  Laterality: Right;   TONSILLECTOMY     TUBAL LIGATION     Social History   Social History Narrative   Right handed    Caffeine- 2 cups per day    Immunization History  Administered Date(s) Administered   Influenza Inj Mdck Quad Pf 10/19/2017   Influenza Split 11/25/2014   Influenza, Quadrivalent, Recombinant, Inj, Pf 08/26/2019   Influenza, Seasonal, Injecte, Preservative Fre 09/20/2015, 09/24/2023   Influenza,inj,Quad PF,6+ Mos 08/04/2018   Influenza-Unspecified 07/18/2017   PFIZER(Purple Top)SARS-COV-2 Vaccination 11/28/2019, 12/19/2019, 06/23/2020     Objective: Vital Signs: BP 126/83 (BP Location: Left Arm, Patient Position: Sitting, Cuff Size: Normal)   Pulse 79   Resp 16   Ht 5' 1.25" (1.556 m)   Wt 172 lb 9.6 oz (78.3 kg)   LMP 05/19/2015   BMI 32.35 kg/m    Physical Exam Vitals and nursing note reviewed.  Constitutional:      Appearance: She is well-developed.  HENT:     Head: Normocephalic and atraumatic.  Eyes:     Conjunctiva/sclera: Conjunctivae  normal.  Cardiovascular:     Rate and Rhythm: Normal rate and regular rhythm.     Heart sounds: Normal heart sounds.  Pulmonary:     Effort: Pulmonary effort is normal.     Breath sounds: Normal breath sounds.  Abdominal:     General: Bowel sounds are normal.     Palpations: Abdomen is soft.  Musculoskeletal:     Cervical back: Normal range of motion.  Lymphadenopathy:     Cervical: No cervical adenopathy.  Skin:    General: Skin is warm and dry.     Capillary Refill: Capillary refill takes less than 2 seconds.  Neurological:     Mental Status: She is alert and oriented to person, place, and time.  Psychiatric:        Behavior: Behavior normal.      Musculoskeletal Exam: C-spine, thoracic spine, lumbar spine have good range of motion.  Shoulder joints, elbow  joints, wrist joints MCPs, PIPs, DIPs have good range of motion with no synovitis.  Complete fist formation bilaterally.  Hip joints have good range of motion with no groin pain.  Knee joints have good range of motion with no warmth or effusion.  Ankle joints have good range of motion with no tenderness or joint swelling.  CDAI Exam: CDAI Score: -- Patient Global: --; Provider Global: -- Swollen: --; Tender: -- Joint Exam 10/09/2023   No joint exam has been documented for this visit   There is currently no information documented on the homunculus. Go to the Rheumatology activity and complete the homunculus joint exam.  Investigation: No additional findings.  Imaging: CT ABDOMEN PELVIS W CONTRAST Result Date: 09/09/2023 CLINICAL DATA:  Acute abdominal pain beginning this morning. EXAM: CT ABDOMEN AND PELVIS WITH CONTRAST TECHNIQUE: Multidetector CT imaging of the abdomen and pelvis was performed using the standard protocol following bolus administration of intravenous contrast. RADIATION DOSE REDUCTION: This exam was performed according to the departmental dose-optimization program which includes automated exposure  control, adjustment of the mA and/or kV according to patient size and/or use of iterative reconstruction technique. CONTRAST:  OMNIPAQUE IOHEXOL 300 MG/ML  SOLN COMPARISON:  11/16/2008 FINDINGS: Lower Chest: No acute findings. Hepatobiliary: 1.3 cm cyst seen in inferior right hepatic lobe. No suspicious hepatic masses identified. Gallbladder is unremarkable. No evidence of biliary ductal dilatation. Pancreas:  No mass or inflammatory changes. Spleen: Within normal limits in size and appearance. Adrenals/Urinary Tract: No suspicious masses identified. No evidence of ureteral calculi or hydronephrosis. Stomach/Bowel: No evidence of obstruction, inflammatory process or abnormal fluid collections. Normal appendix visualized. Mild diverticulosis is seen involving the descending colon, however, there are no signs of diverticulitis. No abnormal fluid collections seen. Vascular/Lymphatic: No pathologically enlarged lymph nodes. No acute vascular findings. Reproductive: Prior hysterectomy noted. Adnexal regions are unremarkable in appearance. Other:  None. Musculoskeletal:  No suspicious bone lesions identified. IMPRESSION: No acute findings. Mild left colonic diverticulosis, without radiographic evidence of diverticulitis. Electronically Signed   By: Danae Orleans M.D.   On: 09/09/2023 15:24    Recent Labs: Lab Results  Component Value Date   WBC 3.9 (L) 09/09/2023   HGB 12.5 09/09/2023   PLT 214 09/09/2023   NA 137 09/09/2023   K 3.7 09/09/2023   CL 103 09/09/2023   CO2 27 09/09/2023   GLUCOSE 100 (H) 09/09/2023   BUN 15 09/09/2023   CREATININE 0.67 09/09/2023   BILITOT 0.6 09/09/2023   ALKPHOS 65 09/09/2023   AST 15 09/09/2023   ALT 15 09/09/2023   PROT 7.4 09/09/2023   ALBUMIN 4.1 09/09/2023   CALCIUM 8.9 09/09/2023   GFRAA 118 03/16/2021   QFTBGOLDPLUS NEGATIVE 08/07/2023    Speciality Comments: PLQ Eye Exam: 12/21/2022  [facility] Follow up in 1  year  Procedures:  No procedures performed Allergies: Peanut-containing drug products, Aspirin, and Augmentin [amoxicillin-pot clavulanate]   Assessment / Plan:     Visit Diagnoses: Rheumatoid arthritis involving multiple sites with positive rheumatoid factor (HCC) -She has no synovitis on examination today.  She has not had any signs or symptoms of a rheumatoid arthritis flare.  She has clinically been doing well on Orencia 125 mg sq injections once weekly, Plaquenil 200 mg 1 tablet by mouth twice daily Monday through Friday, Rasuvo 15 mg sq injections once weekly.  She has been tolerating triple therapy without any side effects or injection site reactions.  She has not  had any recent or recurrent infections.  She has not been experiencing any morning stiffness, nocturnal pain, or difficulty with ADLs.  No medication changes will be made at this time.  She was advised to notify us if she develops signs or symptoms of a flare.  She will follow-up in the office in 5 months or sooner if needed.  Plan: Lipid panel, HgB A1c  High risk medication use - Orencia 125 mg sq injections once weekly, Rasuvo 15 mg sq inj weekly,  Plaquenil 200 mg 1 tablet BID Monday-Friday. Patient is prescribed prednisone 2.5 mg daily by her dermatologist for management of esophageal lichen planus.  Plan to obtain hemoglobin A1c with her next lab work. PLQ Eye Exam: 12/21/2022 [facility] Follow up in 1 year  CBC and CMP updated on 09/09/23.  Her next lab work will be due in March and every 3 months to monitor for drug toxicity. TB gold negative on 08/07/23.  Order for lipid panel was placed today. No recent or recurrent infections.  Discussed the importance of holding Rasuvo and Orencia if she develops signs or symptoms of infection and to resume once infection has completely cleared.  Long term systemic steroid user -Patient has been started on prednisone 2.5 mg by her dermatologist for management  of esophageal lichen planus.  Patient is aware of the risks of long-term prednisone use.  Patient will be due to update her bone density in December 2025.  Plan on updating hemoglobin A1c with next lab work.  Plan: Lipid panel, HgB A1c  Trigger middle finger of right hand: Not currently symptomatic.   Fibromyalgia: She has been under increased stress recently which has been contributing to her quality of sleep.  Discussed the importance of regular exercise, good sleep hygiene, and stress management.  Patient plans on trying low-dose melatonin at bedtime to help with insomnia.  Positive ANA (antinuclear antibody) - 07/13/20: ANA is positive-low, nonspecific titer. RNP remains positive. Complements WNL. dsDNA negative.  No clinical features of systemic lupus at this time.  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   Sicca syndrome (HCC) - Ro and [place] antibodies negative: Chronic.  She has been using Visine and Systane eyedrops  Osteopenia of spine: Previous DEXA 08/24/2019: AP spine BMD 0.909 with T score -1.3.   DEXA updated on 09/12/22: AP spine BMD 0.808 with T-score -2.2. decrease in BMD AP spine. Patient has been started on prednisone 2.5 mg daily for management of esophageal lichen planus by dermatology.  We may need to discuss treatment options for management of osteopenia if she is going to remaining on prednisone long term.   Due to update DEXA in December 2025.  Other medical conditions are listed as follows:  Vitamin D deficiency  History of Helicobacter pylori infection  History of asthma  History of gastroesophageal reflux (GERD)  Abnormal SPEP  History of depression  Lichen planus: Esophageal--under the care of GI and dermatology.  She is currently taking prednisone 2.5 mg daily.   Orders: Orders Placed This Encounter  Procedures   Lipid panel   HgB A1c   Meds ordered this encounter  Medications   folic acid (FOLVITE) 1 MG tablet    Sig: Take 1 tablet (1 mg  total) by mouth 2 (two) times daily.    Dispense:  180 tablet    Refill:  3   hydroxychloroquine (PLAQUENIL) 200 MG tablet    Sig: TAKE 1 TABLET TWICE A DAY MONDAY -  FRIDAY    Dispense:  120 tablet    Refill:  0   Follow-Up Instructions: Return in about 5 months (around 03/08/2024) for Rheumatoid arthritis, Osteoarthritis.   Gearldine Bienenstock, PA-C  Note - This record has been created using Dragon software.  Chart creation errors have been sought, but may not always  have been located. Such creation errors do not reflect on  the standard of medical care.

## 2023-10-04 ENCOUNTER — Other Ambulatory Visit (HOSPITAL_COMMUNITY): Payer: Self-pay

## 2023-10-04 ENCOUNTER — Telehealth: Payer: Self-pay

## 2023-10-04 NOTE — Telephone Encounter (Signed)
Patient called in - DOB/Pharmacy verified - stated Lansoprazole 30 mg was too expensive and requested to go back on Omeprazole 40 mg.  Patient requested ALL prescriptions to now be sent to [facility].  Patient advised message would be forwarded to provider.  Patient verbalized understanding, no further questions.

## 2023-10-07 ENCOUNTER — Other Ambulatory Visit (HOSPITAL_COMMUNITY): Payer: Self-pay

## 2023-10-08 ENCOUNTER — Ambulatory Visit: Payer: 59 | Admitting: *Deleted

## 2023-10-08 ENCOUNTER — Other Ambulatory Visit: Payer: Self-pay | Admitting: Physician Assistant

## 2023-10-08 ENCOUNTER — Other Ambulatory Visit (HOSPITAL_COMMUNITY): Payer: Self-pay

## 2023-10-08 DIAGNOSIS — J4540 Moderate persistent asthma, uncomplicated: Secondary | ICD-10-CM | POA: Diagnosis not present

## 2023-10-08 DIAGNOSIS — M0579 Rheumatoid arthritis with rheumatoid factor of multiple sites without organ or systems involvement: Secondary | ICD-10-CM

## 2023-10-08 MED ORDER — FOLIC ACID 1 MG PO TABS
1.0000 mg | ORAL_TABLET | Freq: Two times a day (BID) | ORAL | 10 refills | Status: DC
  Filled 2023-10-08: qty 60, 30d supply, fill #0

## 2023-10-08 MED ORDER — BUDESONIDE 1 MG/2ML IN SUSP: RESPIRATORY_TRACT | 11 refills | Status: DC

## 2023-10-08 MED ORDER — PREDNISONE 2.5 MG PO TABS
2.5000 mg | ORAL_TABLET | Freq: Every day | ORAL | 3 refills | Status: DC
  Filled 2023-10-08: qty 30, 30d supply, fill #0
  Filled 2023-11-10: qty 30, 30d supply, fill #1
  Filled 2023-11-26 – 2023-12-03 (×2): qty 30, 30d supply, fill #2
  Filled 2023-12-31: qty 30, 30d supply, fill #3
  Filled 2024-02-10: qty 30, 30d supply, fill #4
  Filled 2024-03-21: qty 30, 30d supply, fill #5
  Filled 2024-04-30: qty 30, 30d supply, fill #6

## 2023-10-08 MED ORDER — FAMOTIDINE 40 MG/5ML PO SUSR
40.0000 mg | Freq: Two times a day (BID) | ORAL | 0 refills | Status: DC
  Filled 2023-10-17 – 2023-10-28 (×2): qty 300, 30d supply, fill #0

## 2023-10-08 MED ORDER — EPINEPHRINE 0.3 MG/0.3ML IJ SOAJ: INTRAMUSCULAR | 2 refills | Status: DC

## 2023-10-08 MED ORDER — OMEPRAZOLE 40 MG PO CPDR
40.0000 mg | DELAYED_RELEASE_CAPSULE | Freq: Two times a day (BID) | ORAL | 1 refills | Status: DC
  Filled 2023-10-08: qty 60, 30d supply, fill #0
  Filled 2023-11-10: qty 60, 30d supply, fill #1
  Filled 2023-12-08 – 2023-12-31 (×2): qty 60, 30d supply, fill #2
  Filled 2024-02-07: qty 60, 30d supply, fill #3
  Filled 2024-03-21: qty 60, 30d supply, fill #4
  Filled 2024-04-30: qty 60, 30d supply, fill #5

## 2023-10-08 MED ORDER — ALBUTEROL SULFATE HFA 108 (90 BASE) MCG/ACT IN AERS
2.0000 | INHALATION_SPRAY | Freq: Four times a day (QID) | RESPIRATORY_TRACT | 1 refills | Status: DC | PRN
  Filled 2023-10-08: qty 6.7, 25d supply, fill #0

## 2023-10-08 MED ORDER — CIPROFLOXACIN HCL 250 MG PO TABS: 250.0000 mg | ORAL_TABLET | Freq: Every day | ORAL | 1 refills | Status: DC

## 2023-10-08 MED ORDER — CETIRIZINE HCL 10 MG PO TABS
10.0000 mg | ORAL_TABLET | Freq: Every morning | ORAL | 5 refills | Status: AC
  Filled 2023-10-08: qty 30, 30d supply, fill #0
  Filled 2023-11-10: qty 30, 30d supply, fill #1
  Filled 2023-11-26 – 2023-12-03 (×2): qty 30, 30d supply, fill #2

## 2023-10-08 MED ORDER — ALBUTEROL SULFATE (2.5 MG/3ML) 0.083% IN NEBU
2.5000 mg | INHALATION_SOLUTION | RESPIRATORY_TRACT | 1 refills | Status: DC | PRN
  Filled 2023-10-08: qty 150, 11d supply, fill #0

## 2023-10-08 MED ORDER — AMLODIPINE BESYLATE 10 MG PO TABS
10.0000 mg | ORAL_TABLET | Freq: Every day | ORAL | 1 refills | Status: DC
Start: 2023-03-25 — End: 2024-01-21
  Filled 2023-10-08: qty 30, 30d supply, fill #0
  Filled 2023-11-10: qty 30, 30d supply, fill #1

## 2023-10-08 MED ORDER — TRIAMCINOLONE ACETONIDE 0.1 % EX CREA: 1.0000 | TOPICAL_CREAM | Freq: Two times a day (BID) | CUTANEOUS | 1 refills | Status: DC

## 2023-10-08 MED ORDER — VITAMIN D (ERGOCALCIFEROL) 1.25 MG (50000 UNIT) PO CAPS
50000.0000 [IU] | ORAL_CAPSULE | ORAL | 2 refills | Status: AC
Start: 2023-02-14 — End: ?
  Filled 2023-10-17 – 2023-10-28 (×2): qty 4, 28d supply, fill #0

## 2023-10-08 MED ORDER — MONTELUKAST SODIUM 10 MG PO TABS: 10.0000 mg | ORAL_TABLET | Freq: Every day | ORAL | 1 refills | Status: DC

## 2023-10-08 MED ORDER — CLONIDINE HCL 0.1 MG PO TABS
0.1000 mg | ORAL_TABLET | Freq: Two times a day (BID) | ORAL | 3 refills | Status: AC
  Filled 2023-10-08: qty 60, 30d supply, fill #0
  Filled 2023-11-10: qty 60, 30d supply, fill #1

## 2023-10-08 MED ORDER — SULFAMETHOXAZOLE-TRIMETHOPRIM 800-160 MG PO TABS: 1.0000 | ORAL_TABLET | Freq: Two times a day (BID) | ORAL | 1 refills | Status: DC

## 2023-10-08 NOTE — Addendum Note (Signed)
Addended by: Dub Mikes on: 10/08/2023 04:20 PM   Modules accepted: Orders

## 2023-10-08 NOTE — Addendum Note (Signed)
Addended by: Alfonse Spruce on: 10/08/2023 01:38 PM   Modules accepted: Orders

## 2023-10-08 NOTE — Telephone Encounter (Signed)
Spoke with patient, informed her Dr. Dellis Anes has sent in the Omeprazole 40 mg twice a day due to lansoprazole being too expensive and requested to go back on. Patient verbalized understanding. Patient is requesting all medications be sent to the [facility]. All medications have been sent to the requested pharmacy. Patient verbalized understanding.

## 2023-10-08 NOTE — Telephone Encounter (Signed)
Ok I sent in omeprazole BID.   Malachi Bonds, MD [facility]

## 2023-10-08 NOTE — Telephone Encounter (Signed)
Last Fill: 08/07/2023  Labs: 09/09/2023 Glucose 100, WBC 3.9  TB Gold: 08/07/2023 Neg    Next Visit: 10/09/2023  Last Visit: 04/29/2023  QM:[id number] arthritis involving multiple sites with positive rheumatoid factor   Current Dose per office note 04/29/2023: Orencia 125 mg subcu as injections once weekly   Okay to refill Orencia?

## 2023-10-09 ENCOUNTER — Ambulatory Visit: Payer: 59 | Attending: Physician Assistant | Admitting: Physician Assistant

## 2023-10-09 ENCOUNTER — Other Ambulatory Visit (HOSPITAL_COMMUNITY): Payer: Self-pay

## 2023-10-09 ENCOUNTER — Encounter: Payer: Self-pay | Admitting: Physician Assistant

## 2023-10-09 ENCOUNTER — Other Ambulatory Visit: Payer: Self-pay

## 2023-10-09 VITALS — BP 126/83 | HR 79 | Resp 16 | Ht 61.25 in | Wt 172.6 lb

## 2023-10-09 DIAGNOSIS — Z79899 Other long term (current) drug therapy: Secondary | ICD-10-CM | POA: Diagnosis not present

## 2023-10-09 DIAGNOSIS — Z8659 Personal history of other mental and behavioral disorders: Secondary | ICD-10-CM

## 2023-10-09 DIAGNOSIS — R778 Other specified abnormalities of plasma proteins: Secondary | ICD-10-CM

## 2023-10-09 DIAGNOSIS — R7689 Other specified abnormal immunological findings in serum: Secondary | ICD-10-CM

## 2023-10-09 DIAGNOSIS — Z8619 Personal history of other infectious and parasitic diseases: Secondary | ICD-10-CM

## 2023-10-09 DIAGNOSIS — M797 Fibromyalgia: Secondary | ICD-10-CM | POA: Diagnosis not present

## 2023-10-09 DIAGNOSIS — M3500 Sicca syndrome, unspecified: Secondary | ICD-10-CM

## 2023-10-09 DIAGNOSIS — E559 Vitamin D deficiency, unspecified: Secondary | ICD-10-CM

## 2023-10-09 DIAGNOSIS — Z7952 Long term (current) use of systemic steroids: Secondary | ICD-10-CM

## 2023-10-09 DIAGNOSIS — M0579 Rheumatoid arthritis with rheumatoid factor of multiple sites without organ or systems involvement: Secondary | ICD-10-CM

## 2023-10-09 DIAGNOSIS — M8588 Other specified disorders of bone density and structure, other site: Secondary | ICD-10-CM

## 2023-10-09 DIAGNOSIS — Z8719 Personal history of other diseases of the digestive system: Secondary | ICD-10-CM

## 2023-10-09 DIAGNOSIS — L439 Lichen planus, unspecified: Secondary | ICD-10-CM

## 2023-10-09 DIAGNOSIS — M65331 Trigger finger, right middle finger: Secondary | ICD-10-CM | POA: Diagnosis not present

## 2023-10-09 DIAGNOSIS — R768 Other specified abnormal immunological findings in serum: Secondary | ICD-10-CM

## 2023-10-09 DIAGNOSIS — Z8709 Personal history of other diseases of the respiratory system: Secondary | ICD-10-CM

## 2023-10-09 MED ORDER — HYDROXYCHLOROQUINE SULFATE 200 MG PO TABS
ORAL_TABLET | ORAL | 0 refills | Status: DC
  Filled 2023-10-09: qty 40, 28d supply, fill #0
  Filled 2023-11-10: qty 40, 28d supply, fill #1
  Filled 2023-11-26 – 2023-12-03 (×2): qty 40, 28d supply, fill #2

## 2023-10-09 MED ORDER — FOLIC ACID 1 MG PO TABS
1.0000 mg | ORAL_TABLET | Freq: Two times a day (BID) | ORAL | 3 refills | Status: DC
  Filled 2023-10-09: qty 60, 30d supply, fill #0
  Filled 2023-11-10: qty 60, 30d supply, fill #1
  Filled 2023-11-26 – 2023-12-03 (×2): qty 60, 30d supply, fill #2
  Filled 2024-02-10: qty 60, 30d supply, fill #3
  Filled 2024-03-21: qty 60, 30d supply, fill #4
  Filled 2024-04-30: qty 60, 30d supply, fill #5

## 2023-10-10 ENCOUNTER — Other Ambulatory Visit (HOSPITAL_COMMUNITY): Payer: Self-pay

## 2023-10-10 MED ORDER — CLONIDINE HCL 0.1 MG PO TABS
0.1000 mg | ORAL_TABLET | Freq: Two times a day (BID) | ORAL | 0 refills | Status: DC
  Filled 2023-10-10 – 2023-10-17 (×2): qty 60, 30d supply, fill #0

## 2023-10-10 MED ORDER — AMLODIPINE BESYLATE 10 MG PO TABS
10.0000 mg | ORAL_TABLET | Freq: Every day | ORAL | 0 refills | Status: DC
  Filled 2023-10-10: qty 90, 90d supply, fill #0

## 2023-10-11 ENCOUNTER — Telehealth: Payer: Self-pay | Admitting: Pharmacist

## 2023-10-11 NOTE — Telephone Encounter (Signed)
Received notification from [facility] regarding a prior authorization for ORENCIA SQ. Authorization has been APPROVED from 10/11/2023 to 10/10/2024. Approval letter sent to scan center.  Patient must fill through [facility]: [phone]   Authorization # [id number]  Chesley Mires, PharmD, MPH, BCPS, CPP Clinical Pharmacist (Rheumatology and Pulmonology)

## 2023-10-11 NOTE — Telephone Encounter (Signed)
Submitted a Prior Authorization RENEWAL request to [facility] for ORENCIA SQ via CoverMyMeds. Will update once we receive a response.  Key: [id number]

## 2023-10-14 ENCOUNTER — Other Ambulatory Visit (HOSPITAL_COMMUNITY): Payer: Self-pay

## 2023-10-17 ENCOUNTER — Other Ambulatory Visit (HOSPITAL_COMMUNITY): Payer: Self-pay

## 2023-10-18 ENCOUNTER — Other Ambulatory Visit (HOSPITAL_COMMUNITY): Payer: Self-pay

## 2023-10-23 ENCOUNTER — Ambulatory Visit: Payer: 59 | Admitting: *Deleted

## 2023-10-23 DIAGNOSIS — J4540 Moderate persistent asthma, uncomplicated: Secondary | ICD-10-CM

## 2023-10-25 ENCOUNTER — Telehealth: Payer: Self-pay

## 2023-10-25 NOTE — Telephone Encounter (Signed)
 Spoke with patient and confirmed appointment with Dr Arno Bibles on 10/28/23. Advised patient to arrive around 15 mins early for check in.  Patient confirmed and understood and also was advised of the location of our center.

## 2023-10-28 ENCOUNTER — Other Ambulatory Visit (HOSPITAL_COMMUNITY): Payer: Self-pay

## 2023-10-28 ENCOUNTER — Other Ambulatory Visit: Payer: Self-pay

## 2023-10-28 ENCOUNTER — Inpatient Hospital Stay: Payer: 59 | Attending: Hematology and Oncology | Admitting: Hematology and Oncology

## 2023-10-28 ENCOUNTER — Other Ambulatory Visit: Payer: Self-pay | Admitting: Allergy & Immunology

## 2023-10-28 ENCOUNTER — Other Ambulatory Visit (HOSPITAL_BASED_OUTPATIENT_CLINIC_OR_DEPARTMENT_OTHER): Payer: Self-pay

## 2023-10-28 ENCOUNTER — Inpatient Hospital Stay: Payer: 59

## 2023-10-28 VITALS — BP 124/82 | HR 79 | Temp 98.4°F | Resp 16 | Wt 171.4 lb

## 2023-10-28 DIAGNOSIS — D472 Monoclonal gammopathy: Secondary | ICD-10-CM

## 2023-10-28 DIAGNOSIS — M069 Rheumatoid arthritis, unspecified: Secondary | ICD-10-CM | POA: Insufficient documentation

## 2023-10-28 DIAGNOSIS — Z7952 Long term (current) use of systemic steroids: Secondary | ICD-10-CM | POA: Diagnosis not present

## 2023-10-28 DIAGNOSIS — D509 Iron deficiency anemia, unspecified: Secondary | ICD-10-CM | POA: Insufficient documentation

## 2023-10-28 DIAGNOSIS — L439 Lichen planus, unspecified: Secondary | ICD-10-CM | POA: Diagnosis not present

## 2023-10-28 DIAGNOSIS — R634 Abnormal weight loss: Secondary | ICD-10-CM | POA: Insufficient documentation

## 2023-10-28 DIAGNOSIS — Z800 Family history of malignant neoplasm of digestive organs: Secondary | ICD-10-CM | POA: Insufficient documentation

## 2023-10-28 DIAGNOSIS — Z807 Family history of other malignant neoplasms of lymphoid, hematopoietic and related tissues: Secondary | ICD-10-CM | POA: Insufficient documentation

## 2023-10-28 LAB — IRON AND IRON BINDING CAPACITY (CC-WL,HP ONLY)
Iron: 109 ug/dL (ref 28–170)
Saturation Ratios: 33 % — ABNORMAL HIGH (ref 10.4–31.8)
TIBC: 329 ug/dL (ref 250–450)
UIBC: 220 ug/dL (ref 148–442)

## 2023-10-28 LAB — CBC WITH DIFFERENTIAL/PLATELET
Abs Immature Granulocytes: 0.01 10*3/uL (ref 0.00–0.07)
Basophils Absolute: 0 10*3/uL (ref 0.0–0.1)
Basophils Relative: 1 %
Eosinophils Absolute: 0.1 10*3/uL (ref 0.0–0.5)
Eosinophils Relative: 3 %
HCT: 39.5 % (ref 36.0–46.0)
Hemoglobin: 13.2 g/dL (ref 12.0–15.0)
Immature Granulocytes: 0 %
Lymphocytes Relative: 39 %
Lymphs Abs: 1.4 10*3/uL (ref 0.7–4.0)
MCH: 28.3 pg (ref 26.0–34.0)
MCHC: 33.4 g/dL (ref 30.0–36.0)
MCV: 84.6 fL (ref 80.0–100.0)
Monocytes Absolute: 0.3 10*3/uL (ref 0.1–1.0)
Monocytes Relative: 7 %
Neutro Abs: 1.8 10*3/uL (ref 1.7–7.7)
Neutrophils Relative %: 50 %
Platelets: 224 10*3/uL (ref 150–400)
RBC: 4.67 MIL/uL (ref 3.87–5.11)
RDW: 11.7 % (ref 11.5–15.5)
WBC: 3.5 10*3/uL — ABNORMAL LOW (ref 4.0–10.5)
nRBC: 0 % (ref 0.0–0.2)

## 2023-10-28 LAB — CMP (CANCER CENTER ONLY)
ALT: 14 U/L (ref 0–44)
AST: 16 U/L (ref 15–41)
Albumin: 4.4 g/dL (ref 3.5–5.0)
Alkaline Phosphatase: 71 U/L (ref 38–126)
Anion gap: 5 (ref 5–15)
BUN: 15 mg/dL (ref 6–20)
CO2: 28 mmol/L (ref 22–32)
Calcium: 9.5 mg/dL (ref 8.9–10.3)
Chloride: 105 mmol/L (ref 98–111)
Creatinine: 0.72 mg/dL (ref 0.44–1.00)
GFR, Estimated: >60 mL/min (ref >60)
Glucose, Bld: 103 mg/dL — ABNORMAL HIGH (ref 70–99)
Potassium: 4.2 mmol/L (ref 3.5–5.1)
Sodium: 138 mmol/L (ref 135–145)
Total Bilirubin: 0.4 mg/dL (ref 0.0–1.2)
Total Protein: 7.6 g/dL (ref 6.5–8.1)

## 2023-10-28 LAB — FERRITIN: Ferritin: 31 ng/mL (ref 11–307)

## 2023-10-28 MED ORDER — EPINEPHRINE 0.3 MG/0.3ML IJ SOAJ
INTRAMUSCULAR | 2 refills | Status: DC
  Filled 2023-10-28: qty 2, 14d supply, fill #0

## 2023-10-28 MED FILL — Montelukast Sodium Tab 10 MG (Base Equiv): ORAL | 30 days supply | Qty: 30 | Fill #0 | Status: AC

## 2023-10-28 NOTE — Progress Notes (Signed)
 [facility] CONSULT NOTE  Patient Care Team: Julie Dunn, MD as PCP - General (Family Medicine)  CHIEF COMPLAINTS/PURPOSE OF CONSULTATION:  Abnormal SPEP  ASSESSMENT & PLAN:   This is a pleasant 58 year old female patient with a past medical history significant for rheumatoid arthritis, asthma, iron deficiency anemia referred to hematology for MGUS follow up.  Lichen Planus Diagnosed in the esophagus and skin. Esophageal involvement has caused difficulty swallowing and required multiple esophageal dilations. Currently on prednisone  with some improvement in swallowing. -Continue prednisone  as directed by specialist. -Continue monitoring and treatment under specialist care at [facility].  MGUS (Monoclonal Gammopathy of Undetermined Significance) History of MGUS with annual monitoring due to family history of multiple myeloma. -Order annual blood work to monitor MGUS.  Weight Loss Significant weight loss achieved through increased physical activity and dietary changes. -Encourage continuation of current lifestyle modifications.  Follow-up Annual follow-up for MGUS monitoring and to assess overall health status. -Schedule follow-up appointment in one year.  Julie Arms MD   HISTORY OF PRESENTING ILLNESS:   Julie [name] 58 y.o. female is here because of abnormal SPEP.  This is a very pleasant 58 year old female patient with past medical history significant for rheumatoid arthritis, asthma, iron deficiency anemia referred to hematology for evaluation of follow up on low risk MGUS.  Discussed the use of AI scribe software for clinical note transcription with the patient, who gave verbal consent to proceed.  History of Present Illness    Julie [name] is a 58 year old female with esophageal lichen planus who presents for follow up regarding MGUS.  She has ongoing issues with her esophagus, initially thought to require dilation, but further investigation  revealed esophageal lichen planus. She experiences difficulty swallowing, with pills getting stuck and requiring warm water  to help them go down. She has undergone esophageal dilation procedures three times, with limited success in increasing the esophageal diameter from less than eight millimeters to ten and eleven millimeters, respectively. She is currently on prednisone , which has slightly improved her swallowing ability. No pain when swallowing, but experiences pills getting stuck in her esophagus, requiring warm water  to help them go down. She denies taking iron supplements.  She also has lichen planus on her skin, which was identified before the esophageal involvement. Initially, a dermatologist prescribed a topical cream, but it was deemed insufficient as the esophageal condition progressed.  She has lost approximately thirty pounds, which she attributes to increased physical activity and dietary changes, including using a water  app and a step counter to maintain over six thousand steps a day. Despite being on prednisone , her weight has only slightly increased from 165 to 171 pounds over the past month.  Her family history includes her father, who had multiple myeloma, colon cancer, and congestive heart failure. She is aware of the MGUS protein that is being monitored annually due to her family history.  Rest of the pertinent 10 point ROS reviewed and negative.  MEDICAL HISTORY:  Past Medical History:  Diagnosis Date   Anemia    Asthma    Complication of anesthesia    Eczema    Esophageal dysphagia    GERD (gastroesophageal reflux disease)    Hypertension    Lichen planus 2024   esophageal   PONV (postoperative nausea and vomiting)    Rheumatoid arthritis (HCC)     SURGICAL HISTORY: Past Surgical History:  Procedure Laterality Date   ABDOMINAL HYSTERECTOMY     partial age 25   BILATERAL  SALPINGECTOMY Bilateral 05/25/2015   Procedure: BILATERAL SALPINGECTOMY;  Surgeon:  Ivery Marking, MD;  Location: [facility];  Service: Gynecology;  Laterality: Bilateral;   CARPAL TUNNEL RELEASE Left 01/05/2020   Procedure: LEFT CARPAL TUNNEL RELEASE;  Surgeon: Lyanne Sample, MD;  Location: [facility];  Service: Orthopedics;  Laterality: Left;  FOREARM BIER BLOCK   COLONOSCOPY     ESOPHAGEAL DILATION     x3 in 2024   OVARIAN CYST REMOVAL Right 05/25/2015   Procedure: OVARIAN CYSTECTOMY;  Surgeon: Ivery Marking, MD;  Location: [facility];  Service: Gynecology;  Laterality: Right;   TONSILLECTOMY     TUBAL LIGATION      SOCIAL HISTORY: Social History   Socioeconomic History   Marital status: Married    Spouse name: Not on file   Number of children: Not on file   Years of education: Not on file   Highest education level: Not on file  Occupational History   Not on file  Tobacco Use   Smoking status: Never    Passive exposure: Never   Smokeless tobacco: Never  Vaping Use   Vaping status: Never Used  Substance and Sexual Activity   Alcohol use: No   Drug use: No   Sexual activity: Yes  Other Topics Concern   Not on file  Social History Narrative   Right handed    Caffeine- 2 cups per day    Social Drivers of Health   Financial Resource Strain: Not on file  Food Insecurity: Not on file  Transportation Needs: Not on file  Physical Activity: Not on file  Stress: Not on file  Social Connections: Not on file  Intimate Partner Violence: Not on file    FAMILY HISTORY: Family History  Problem Relation Age of Onset   Asthma Mother    Eczema Mother    Sarcoidosis Mother    Hypertension Mother    Heart attack Mother    Cancer Father    Hypotension Father    Congestive Heart Failure Father    Colon cancer Father    Multiple myeloma Father    Asthma Sister    GER disease Sister    Lung cancer Maternal Grandfather        smoked   Allergic rhinitis Neg Hx    Angioedema Neg Hx    Immunodeficiency Neg Hx    Urticaria Neg Hx     Esophageal cancer Neg Hx    Rectal cancer Neg Hx    Stomach cancer Neg Hx     ALLERGIES:  is allergic to peanut-containing drug products, aspirin, and augmentin  [amoxicillin -pot clavulanate].  MEDICATIONS:  Current Outpatient Medications  Medication Sig Dispense Refill   albuterol  (PROVENTIL ) (2.5 MG/3ML) 0.083% nebulizer solution Inhale 3 mLs (2.5 mg total) by nebulization every 4 (four) hours as needed for wheezing or shortness of breath. 150 mL 1   albuterol  (VENTOLIN  HFA) 108 (90 Base) MCG/ACT inhaler Inhale 2 puffs into the lungs every 6 (six) hours as needed for wheezing or shortness of breath. 6.7 g 1   amLODipine  (NORVASC ) 10 MG tablet Take 1 tablet (10 mg total) by mouth daily. 90 tablet 1   cetirizine  (ZYRTEC ) 10 MG tablet Take 1 tablet (10 mg total) by mouth in the morning. TAKE 1 TABLET EVERY DAY (Patient not taking: Reported on 10/09/2023) 30 tablet 5   cetirizine  (ZYRTEC ) 10 MG tablet Take 1 tablet (10 mg total) by mouth every morning. 30 tablet 5   cloNIDine  (CATAPRES ) 0.1 MG  tablet Take 1 tablet (0.1 mg total) by mouth 2 (two) times daily. 60 tablet 3   cloNIDine  (CATAPRES ) 0.1 MG tablet Take 1 tablet (0.1 mg total) by mouth 2 (two) times daily. 60 tablet 0   diclofenac  Sodium (VOLTAREN ) 1 % GEL Apply 2g to 4g to affected area up to 4 times daily as needed. 400 g 4   dupilumab  (DUPIXENT ) 300 MG/2ML prefilled syringe INJECT 300MG  SUBCUTANEOUSLY EVERY OTHER WEEK 4 mL 11   EPINEPHrine  0.3 mg/0.3 mL IJ SOAJ injection Inject 0.3 mg into the muscle as needed for anaphylaxis. 2 each 2   folic acid  (FOLVITE ) 1 MG tablet Take 1 tablet (1 mg total) by mouth 2 (two) times daily. 180 tablet 3   hydroxychloroquine  (PLAQUENIL ) 200 MG tablet TAKE 1 TABLET TWICE A DAY MONDAY -FRIDAY 120 tablet 0   levocetirizine (XYZAL ) 5 MG tablet Take 1 tablet (5 mg total) by mouth at bedtime. 30 tablet 5   Methotrexate , PF, (RASUVO ) 15 MG/0.3ML SOAJ INJECT THE CONTENTS OF ONE  AUTO-INJECTOR SUBCUTANEOUSLY   WEEKLY AS DIRECTED 3.6 mL 0   montelukast  (SINGULAIR ) 10 MG tablet Take 1 tablet (10 mg total) by mouth at bedtime. 90 tablet 1   Multiple Vitamins-Minerals (DAILY MULTIVITAMIN PO) Take 1 tablet by mouth daily.      omeprazole  (PRILOSEC) 40 MG capsule Take 1 capsule (40 mg total) by mouth 2 (two) times daily. 180 capsule 1   ORENCIA  CLICKJECT 125 MG/ML SOAJ INJECT 1 PEN SUBCUTANEOUSLY  WEEKLY 4 mL 2   predniSONE  (DELTASONE ) 2.5 MG tablet Take 1 tablet (2.5 mg total) by mouth daily. 90 tablet 3   Current Facility-Administered Medications  Medication Dose Route Frequency Provider Last Rate Last Admin   Dupilumab  SOSY 300 mg  300 mg Subcutaneous Q14 Days Ardie Kras, FNP   300 mg at 10/23/23 1423    PHYSICAL EXAMINATION: ECOG PERFORMANCE STATUS: 0 - Asymptomatic  Vitals:   10/28/23 0850  BP: 124/82  Pulse: 79  Resp: 16  Temp: 98.4 F (36.9 C)  SpO2: 98%   Filed Weights   10/28/23 0850  Weight: 171 lb 6.4 oz (77.7 kg)   Physical Exam Constitutional:      Appearance: Normal appearance.  HENT:     Head: Normocephalic and atraumatic.  Cardiovascular:     Rate and Rhythm: Normal rate and regular rhythm.  Pulmonary:     Effort: Pulmonary effort is normal.     Breath sounds: Normal breath sounds.  Abdominal:     General: Abdomen is flat.     Palpations: Abdomen is soft.  Musculoskeletal:        General: No swelling or tenderness.  Skin:    General: Skin is warm and dry.  Neurological:     General: No focal deficit present.     Mental Status: She is alert.  Psychiatric:        Mood and Affect: Mood normal.        Behavior: Behavior normal.      LABORATORY DATA:  I have reviewed the data as listed Lab Results  Component Value Date   WBC 3.5 (L) 10/28/2023   HGB 13.2 10/28/2023   HCT 39.5 10/28/2023   MCV 84.6 10/28/2023   PLT 224 10/28/2023     Chemistry      Component Value Date/Time   NA 137 09/09/2023 1111   NA 140 10/02/2016 1602   K 3.7 09/09/2023 1111    CL 103 09/09/2023 1111  CO2 27 09/09/2023 1111   BUN 15 09/09/2023 1111   BUN 8 10/02/2016 1602   CREATININE 0.67 09/09/2023 1111   CREATININE 0.62 08/07/2023 1355      Component Value Date/Time   CALCIUM  8.9 09/09/2023 1111   ALKPHOS 65 09/09/2023 1111   AST 15 09/09/2023 1111   AST 14 (L) 04/24/2022 0901   ALT 15 09/09/2023 1111   ALT 13 04/24/2022 0901   BILITOT 0.6 09/09/2023 1111   BILITOT 0.4 04/24/2022 0901     Labs from February 2023 with an M spike of 0.6 g/dL normal CMP kappa lambda ratio of 2.56.  No evidence of immunoparesis Most recent CBC from June with no anemia, mild leukopenia and neutropenia noted. CMP with no evidence of AKI  Labs from today pending.  RADIOGRAPHIC STUDIES: I have personally reviewed the radiological images as listed and agreed with the findings in the report. No results found.    I spent 20 minutes in the care of this patient including history and physical, review of records, discussion about MGUS and surveillance recommendations.    Julie Arms, MD 10/28/2023 9:39 AM

## 2023-10-29 ENCOUNTER — Other Ambulatory Visit (HOSPITAL_COMMUNITY): Payer: Self-pay

## 2023-10-29 LAB — KAPPA/LAMBDA LIGHT CHAINS
Kappa free light chain: 21.5 mg/L — ABNORMAL HIGH (ref 3.3–19.4)
Kappa, lambda light chain ratio: 2.56 — ABNORMAL HIGH (ref 0.26–1.65)
Lambda free light chains: 8.4 mg/L (ref 5.7–26.3)

## 2023-10-30 LAB — IGG, IGA, IGM
IgA: 150 mg/dL (ref 87–352)
IgG (Immunoglobin G), Serum: 1408 mg/dL (ref 586–1602)
IgM (Immunoglobulin M), Srm: 107 mg/dL (ref 26–217)

## 2023-11-04 LAB — PROTEIN ELECTROPHORESIS, SERUM, WITH REFLEX
A/G Ratio: 1.2 (ref 0.7–1.7)
Albumin ELP: 4 g/dL (ref 2.9–4.4)
Alpha-1-Globulin: 0.3 g/dL (ref 0.0–0.4)
Alpha-2-Globulin: 0.7 g/dL (ref 0.4–1.0)
Beta Globulin: 1 g/dL (ref 0.7–1.3)
Gamma Globulin: 1.3 g/dL (ref 0.4–1.8)
Globulin, Total: 3.3 g/dL (ref 2.2–3.9)
M-Spike, %: 0.4 g/dL — ABNORMAL HIGH
SPEP Interpretation: 0
Total Protein ELP: 7.3 g/dL (ref 6.0–8.5)

## 2023-11-04 LAB — IMMUNOFIXATION REFLEX, SERUM
IgA: 177 mg/dL (ref 87–352)
IgG (Immunoglobin G), Serum: 1463 mg/dL (ref 586–1602)
IgM (Immunoglobulin M), Srm: 113 mg/dL (ref 26–217)

## 2023-11-06 ENCOUNTER — Ambulatory Visit: Payer: 59

## 2023-11-06 NOTE — Progress Notes (Signed)
 11/07/23- 57 yoF with OSA, complicated by HTN, SAR/ Asthma (followed by Allergist), LPR/GERD, Rheumatoid Arthritis, Food Allergy Anaphylaxis, MGUS,  Split Night Sleep Study 01/07/22- AHI 35.9/hr, desat to 73%, body weight 190 lbs, CPAP to 12 (5-15) - albuterol neb, pulmicort neb, albuterol, Dupixent      Methotrexate, Orencia, prednisone 2.5mg  daily/(esophageal lichen planus)  CPAP 12/ [facility]     ordered 03/02/22    >>today change to auto 5-15 Download compliance-67%, AHI 4/hr Body weight today-172 lbs Last seen by Dr Thora Lance 12/ 2023 Discussed the use of AI scribe software for clinical note transcription with the patient, who gave verbal consent to proceed. History of Present Illness   The patient, with a history of sleep apnea, presents with issues related to her CPAP mask fitting. She reports that since losing 30 pounds, she has had to adjust her mask throughout the night for proper fit. She has tried different masks from her collection, but the fit remains an issue. She also mentions that she was initially fitted with a full face mask due to mouth breathing during sleep. The patient confirms that the CPAP has been beneficial, particularly in managing symptoms of long COVID.  In addition to the sleep apnea, the patient has been diagnosed with lichen planus, which has caused issues with swallowing and required esophageal stretching. She is currently on prednisone for this condition, which has resulted in dryness of the eyes, nose, and throat. This dryness has caused discomfort when using the CPAP, leading to intermittent use for about a week. We discussed change to autopap, trial of nasal saline gel.     ROS-see HPI   + = positive Constitutional:    +weight loss, night sweats, fevers, chills, fatigue, lassitude. HEENT:    headaches, difficulty swallowing, tooth/dental problems, sore throat,       sneezing, itching, ear ache, nasal congestion, post nasal drip, snoring CV:    chest pain, orthopnea, PND,  swelling in lower extremities, anasarca,                                   \dizziness, palpitations Resp:   shortness of breath with exertion or at rest.                productive cough,   non-productive cough, coughing up of blood.              change in color of mucus.  wheezing.   Skin:    rash or lesions. GI:  No-   heartburn, indigestion, abdominal pain, nausea, vomiting, diarrhea,                 change in bowel habits, loss of appetite GU: dysuria, change in color of urine, no urgency or frequency.   flank pain. MS:   joint pain, stiffness, decreased range of motion, back pain. Neuro-     nothing unusual Psych:  change in mood or affect.  depression or anxiety.   memory loss.  OBJ- Physical Exam General- Alert, Oriented, Affect-appropriate, Distress- none acute, +obese Skin- rash-none, lesions- none, excoriation- none Lymphadenopathy- none Head- atraumatic            Eyes- Gross vision intact, PERRLA, conjunctivae and secretions clear            Ears- Hearing, canals-normal            Nose- Clear, no-Septal dev, mucus, polyps, erosion, perforation  Throat- Mallampati IV , mucosa clear , drainage- none, tonsils- atrophic, +teeth Neck- flexible , trachea midline, no stridor , thyroid nl, carotid no bruit Chest - symmetrical excursion , unlabored           Heart/CV- RRR , no murmur , no gallop  , no rub, nl s1 s2                           - JVD- none , edema- none, stasis changes- none, varices- none           Lung- clear to P&A, wheeze- none, cough- none , dullness-none, rub- none           Chest wall-  Abd-  Br/ Gen/ Rectal- Not done, not indicated Extrem- cyanosis- none, clubbing, none, atrophy- none, strength- nl Neuro- grossly intact to observation  Assessment and Plan:    Obstructive Sleep Apnea Patient reports weight loss and issues with CPAP mask fit. Discussed options for different mask types and potential for auto-adjusting CPAP. -Refer to home care for  mask refitting. -Change CPAP settings to auto-adjusting (5-15 cm H2O).  Dryness related to CPAP use Patient reports dryness and burning sensation, possibly exacerbated by concurrent prednisone use for lichen planus. -Recommend use of nasal saline gel for comfort. -Advise patient to adjust humidifier settings on CPAP machine for comfort.  Lichen Planus Patient reports diagnosis and ongoing prednisone treatment. -No changes to current treatment plan discussed.  Follow-up No specific follow-up time mentioned. -Continue current management and follow-up as needed.

## 2023-11-07 ENCOUNTER — Ambulatory Visit: Payer: 59 | Admitting: Internal Medicine

## 2023-11-07 ENCOUNTER — Encounter: Payer: Self-pay | Admitting: Internal Medicine

## 2023-11-07 VITALS — BP 106/60 | HR 91 | Temp 97.7°F | Ht 61.25 in | Wt 172.8 lb

## 2023-11-07 DIAGNOSIS — L439 Lichen planus, unspecified: Secondary | ICD-10-CM | POA: Diagnosis not present

## 2023-11-07 DIAGNOSIS — G4733 Obstructive sleep apnea (adult) (pediatric): Secondary | ICD-10-CM

## 2023-11-07 NOTE — Patient Instructions (Signed)
 Order- [facility]- please change CPAP to autopap 5-15, refit mask of choice after weight loss, continue humidifier, supplies, Airview/ card. Please teach to adjust humidifier for comfort.  Suggest you try otc nasal saline GEL- a little in each nostril at bedtime or as needed may help with dry/ burning

## 2023-11-08 ENCOUNTER — Other Ambulatory Visit (HOSPITAL_COMMUNITY): Payer: Self-pay

## 2023-11-11 ENCOUNTER — Other Ambulatory Visit (HOSPITAL_COMMUNITY): Payer: Self-pay

## 2023-11-11 MED ORDER — PREDNISONE 2.5 MG PO TABS: 2.5000 mg | ORAL_TABLET | Freq: Every day | ORAL | 0 refills | Status: DC

## 2023-11-13 ENCOUNTER — Ambulatory Visit (INDEPENDENT_AMBULATORY_CARE_PROVIDER_SITE_OTHER): Payer: 59 | Admitting: *Deleted

## 2023-11-13 DIAGNOSIS — J4540 Moderate persistent asthma, uncomplicated: Secondary | ICD-10-CM | POA: Diagnosis not present

## 2023-11-20 ENCOUNTER — Encounter: Payer: Self-pay | Admitting: Internal Medicine

## 2023-11-26 ENCOUNTER — Other Ambulatory Visit (HOSPITAL_COMMUNITY): Payer: Self-pay

## 2023-11-26 MED FILL — Montelukast Sodium Tab 10 MG (Base Equiv): ORAL | 30 days supply | Qty: 30 | Fill #1 | Status: CN

## 2023-11-27 ENCOUNTER — Ambulatory Visit: Payer: 59

## 2023-12-03 MED FILL — Montelukast Sodium Tab 10 MG (Base Equiv): ORAL | 30 days supply | Qty: 30 | Fill #1 | Status: AC

## 2023-12-04 ENCOUNTER — Ambulatory Visit: Admitting: *Deleted

## 2023-12-04 DIAGNOSIS — J4540 Moderate persistent asthma, uncomplicated: Secondary | ICD-10-CM | POA: Diagnosis not present

## 2023-12-08 ENCOUNTER — Other Ambulatory Visit (HOSPITAL_COMMUNITY): Payer: Self-pay

## 2023-12-09 ENCOUNTER — Other Ambulatory Visit: Payer: Self-pay

## 2023-12-09 ENCOUNTER — Other Ambulatory Visit: Payer: Self-pay | Admitting: Allergy and Immunology

## 2023-12-10 ENCOUNTER — Other Ambulatory Visit (HOSPITAL_COMMUNITY): Payer: Self-pay

## 2023-12-10 ENCOUNTER — Encounter (HOSPITAL_COMMUNITY): Payer: Self-pay

## 2023-12-18 ENCOUNTER — Ambulatory Visit: Admitting: *Deleted

## 2023-12-18 DIAGNOSIS — J4540 Moderate persistent asthma, uncomplicated: Secondary | ICD-10-CM

## 2023-12-19 ENCOUNTER — Other Ambulatory Visit (HOSPITAL_COMMUNITY): Payer: Self-pay

## 2023-12-31 ENCOUNTER — Encounter (HOSPITAL_COMMUNITY): Payer: Self-pay

## 2023-12-31 ENCOUNTER — Other Ambulatory Visit: Payer: Self-pay | Admitting: Allergy & Immunology

## 2023-12-31 ENCOUNTER — Encounter: Payer: Self-pay | Admitting: Allergy & Immunology

## 2023-12-31 ENCOUNTER — Other Ambulatory Visit (HOSPITAL_COMMUNITY): Payer: Self-pay

## 2023-12-31 ENCOUNTER — Other Ambulatory Visit: Payer: Self-pay

## 2023-12-31 MED ORDER — LEVOCETIRIZINE DIHYDROCHLORIDE 5 MG PO TABS
5.0000 mg | ORAL_TABLET | Freq: Every evening | ORAL | 5 refills | Status: AC
  Filled 2023-12-31: qty 30, 30d supply, fill #0
  Filled 2024-02-23: qty 30, 30d supply, fill #1
  Filled 2024-06-29: qty 30, 30d supply, fill #2
  Filled 2024-09-07: qty 30, 30d supply, fill #3
  Filled 2024-10-09: qty 30, 30d supply, fill #4

## 2023-12-31 MED ORDER — CETIRIZINE HCL 10 MG PO TABS
10.0000 mg | ORAL_TABLET | Freq: Every morning | ORAL | 5 refills | Status: DC
  Filled 2023-12-31: qty 30, 30d supply, fill #0

## 2023-12-31 MED FILL — Montelukast Sodium Tab 10 MG (Base Equiv): ORAL | 30 days supply | Qty: 30 | Fill #2 | Status: AC

## 2023-12-31 MED FILL — Cetirizine HCl Tab 10 MG: ORAL | 30 days supply | Qty: 30 | Fill #0 | Status: AC

## 2024-01-01 ENCOUNTER — Ambulatory Visit

## 2024-01-01 DIAGNOSIS — J4540 Moderate persistent asthma, uncomplicated: Secondary | ICD-10-CM | POA: Diagnosis not present

## 2024-01-03 ENCOUNTER — Other Ambulatory Visit (HOSPITAL_COMMUNITY): Payer: Self-pay

## 2024-01-15 ENCOUNTER — Ambulatory Visit (INDEPENDENT_AMBULATORY_CARE_PROVIDER_SITE_OTHER)

## 2024-01-15 DIAGNOSIS — J4540 Moderate persistent asthma, uncomplicated: Secondary | ICD-10-CM | POA: Diagnosis not present

## 2024-01-18 ENCOUNTER — Other Ambulatory Visit: Payer: Self-pay | Admitting: Physician Assistant

## 2024-01-18 DIAGNOSIS — Z9225 Personal history of immunosupression therapy: Secondary | ICD-10-CM

## 2024-01-18 DIAGNOSIS — Z79899 Other long term (current) drug therapy: Secondary | ICD-10-CM

## 2024-01-18 DIAGNOSIS — M0579 Rheumatoid arthritis with rheumatoid factor of multiple sites without organ or systems involvement: Secondary | ICD-10-CM

## 2024-01-18 DIAGNOSIS — Z111 Encounter for screening for respiratory tuberculosis: Secondary | ICD-10-CM

## 2024-01-20 ENCOUNTER — Other Ambulatory Visit (HOSPITAL_COMMUNITY): Payer: Self-pay

## 2024-01-20 ENCOUNTER — Other Ambulatory Visit: Payer: Self-pay | Admitting: Allergy & Immunology

## 2024-01-20 ENCOUNTER — Other Ambulatory Visit: Payer: Self-pay

## 2024-01-20 ENCOUNTER — Other Ambulatory Visit: Payer: Self-pay | Admitting: Physician Assistant

## 2024-01-20 ENCOUNTER — Other Ambulatory Visit: Payer: Self-pay | Admitting: *Deleted

## 2024-01-20 DIAGNOSIS — Z79899 Other long term (current) drug therapy: Secondary | ICD-10-CM

## 2024-01-20 MED ORDER — HYDROXYCHLOROQUINE SULFATE 200 MG PO TABS
200.0000 mg | ORAL_TABLET | Freq: Two times a day (BID) | ORAL | 0 refills | Status: DC
  Filled 2024-01-20: qty 40, 28d supply, fill #0
  Filled 2024-02-23: qty 40, 28d supply, fill #1
  Filled 2024-03-21: qty 40, 28d supply, fill #2

## 2024-01-20 MED ORDER — CALCIUM CARBONATE ANTACID 500 MG PO CHEW
500.0000 mg | CHEWABLE_TABLET | Freq: Every day | ORAL | 4 refills | Status: AC
  Filled 2024-02-07 – 2024-02-10 (×2): qty 150, 150d supply, fill #0

## 2024-01-20 MED FILL — Famotidine For Susp 40 MG/5ML: ORAL | 30 days supply | Qty: 300 | Fill #0 | Status: AC

## 2024-01-20 NOTE — Telephone Encounter (Signed)
 Last Fill: 10/09/2023  Eye exam: 12/21/2022  Labs: 10/28/2023: Glucose 103, WBC 3.5  Next Visit: 03/09/2024  Last Visit: 10/09/2023  DX: Rheumatoid arthritis involving multiple sites with positive rheumatoid factor   Current Dose per office note 10/09/2023: Plaquenil  200 mg 1 tablet by mouth twice daily Monday through Friday   Spoke with patient about eye exam, she is calling the eye doctor to follow up and will let us  know when her appointment is scheduled for.   Okay to refill Plaquenil ?

## 2024-01-20 NOTE — Telephone Encounter (Signed)
 Last Fill: 10/08/2023  Labs: 2/10/205 Glucose 103, WBC 3.5  TB Gold: 08/07/2023 Neg    Next Visit: 03/09/2024  Last Visit: 10/09/2023  DX: Rheumatoid arthritis involving multiple sites with positive rheumatoid factor   Current Dose per office note 10/09/2023: Orencia  125 mg sq injections once weekly   Patient advised she is due to update labs. Patient states she will come into the office today to update.   Okay to refill Orencia ?

## 2024-01-21 ENCOUNTER — Other Ambulatory Visit (HOSPITAL_COMMUNITY): Payer: Self-pay

## 2024-01-21 LAB — CBC WITH DIFFERENTIAL/PLATELET
Absolute Lymphocytes: 1357 {cells}/uL (ref 850–3900)
Absolute Monocytes: 277 {cells}/uL (ref 200–950)
Basophils Absolute: 40 {cells}/uL (ref 0–200)
Basophils Relative: 1.1 %
Eosinophils Absolute: 50 {cells}/uL (ref 15–500)
Eosinophils Relative: 1.4 %
HCT: 39.3 % (ref 35.0–45.0)
Hemoglobin: 13 g/dL (ref 11.7–15.5)
MCH: 28.4 pg (ref 27.0–33.0)
MCHC: 33.1 g/dL (ref 32.0–36.0)
MCV: 85.8 fL (ref 80.0–100.0)
MPV: 11 fL (ref 7.5–12.5)
Monocytes Relative: 7.7 %
Neutro Abs: 1876 {cells}/uL (ref 1500–7800)
Neutrophils Relative %: 52.1 %
Platelets: 231 10*3/uL (ref 140–400)
RBC: 4.58 10*6/uL (ref 3.80–5.10)
RDW: 12 % (ref 11.0–15.0)
Total Lymphocyte: 37.7 %
WBC: 3.6 10*3/uL — ABNORMAL LOW (ref 3.8–10.8)

## 2024-01-21 LAB — COMPREHENSIVE METABOLIC PANEL WITH GFR
AG Ratio: 1.6 (calc) (ref 1.0–2.5)
ALT: 14 U/L (ref 6–29)
AST: 15 U/L (ref 10–35)
Albumin: 4.7 g/dL (ref 3.6–5.1)
Alkaline phosphatase (APISO): 69 U/L (ref 37–153)
BUN: 16 mg/dL (ref 7–25)
CO2: 29 mmol/L (ref 20–32)
Calcium: 9.5 mg/dL (ref 8.6–10.4)
Chloride: 104 mmol/L (ref 98–110)
Creat: 0.83 mg/dL (ref 0.50–1.03)
Globulin: 2.9 g/dL (ref 1.9–3.7)
Glucose, Bld: 102 mg/dL — ABNORMAL HIGH (ref 65–99)
Potassium: 5.1 mmol/L (ref 3.5–5.3)
Sodium: 140 mmol/L (ref 135–146)
Total Bilirubin: 0.4 mg/dL (ref 0.2–1.2)
Total Protein: 7.6 g/dL (ref 6.1–8.1)
eGFR: 83 mL/min/{1.73_m2} (ref >=60)

## 2024-01-21 MED ORDER — CLONIDINE HCL 0.1 MG PO TABS
0.1000 mg | ORAL_TABLET | Freq: Two times a day (BID) | ORAL | 0 refills | Status: DC
  Filled 2024-01-21: qty 60, 30d supply, fill #0

## 2024-01-21 MED ORDER — AMLODIPINE BESYLATE 10 MG PO TABS
10.0000 mg | ORAL_TABLET | Freq: Every day | ORAL | 0 refills | Status: DC
  Filled 2024-01-21: qty 30, 30d supply, fill #0
  Filled 2024-02-07 – 2024-02-13 (×3): qty 30, 30d supply, fill #1
  Filled 2024-03-21: qty 30, 30d supply, fill #2

## 2024-01-21 NOTE — Progress Notes (Signed)
 White cell count is low due to immunosuppression.  CMP is normal.

## 2024-01-22 ENCOUNTER — Other Ambulatory Visit (HOSPITAL_COMMUNITY): Payer: Self-pay

## 2024-01-29 ENCOUNTER — Ambulatory Visit

## 2024-01-29 DIAGNOSIS — J4540 Moderate persistent asthma, uncomplicated: Secondary | ICD-10-CM

## 2024-02-03 NOTE — Progress Notes (Signed)
 11/07/23- 58 yoF with OSA, complicated by HTN, SAR/ Asthma (followed by Allergist), LPR/GERD, Rheumatoid Arthritis, Food Allergy Anaphylaxis, MGUS,  Split Night Sleep Study 01/07/22- AHI 35.9/hr, desat to 73%, body weight 190 lbs, CPAP to 12 (5-15) - albuterol  neb, pulmicort  neb, albuterol , Dupixent       Methotrexate , Orencia , prednisone  2.5mg  daily/(esophageal lichen planus)  CPAP 12/ [facility]     ordered 03/02/22    >>today change to auto 5-15 Download compliance-67%, AHI 4/hr Body weight today-172 lbs Last seen by Dr Jenny Mohs 12/ 2023 Discussed the use of AI scribe software for clinical note transcription with the patient, who gave verbal consent to proceed. History of Present Illness   The patient, with a history of sleep apnea, presents with issues related to her CPAP mask fitting. She reports that since losing 30 pounds, she has had to adjust her mask throughout the night for proper fit. She has tried different masks from her collection, but the fit remains an issue. She also mentions that she was initially fitted with a full face mask due to mouth breathing during sleep. The patient confirms that the CPAP has been beneficial, particularly in managing symptoms of long COVID.  In addition to the sleep apnea, the patient has been diagnosed with lichen planus, which has caused issues with swallowing and required esophageal stretching. She is currently on prednisone  for this condition, which has resulted in dryness of the eyes, nose, and throat. This dryness has caused discomfort when using the CPAP, leading to intermittent use for about a week. We discussed change to autopap, trial of nasal saline gel.    Assessment and Plan:    Obstructive Sleep Apnea Patient reports weight loss and issues with CPAP mask fit. Discussed options for different mask types and potential for auto-adjusting CPAP. -Refer to home care for mask refitting. -Change CPAP settings to auto-adjusting (5-15 cm H2O).  Dryness related  to CPAP use Patient reports dryness and burning sensation, possibly exacerbated by concurrent prednisone  use for lichen planus. -Recommend use of nasal saline gel for comfort. -Advise patient to adjust humidifier settings on CPAP machine for comfort.  Lichen Planus Patient reports diagnosis and ongoing prednisone  treatment. -No changes to current treatment plan discussed.  Follow-up No specific follow-up time mentioned. -Continue current management and follow-up as needed.     02/04/24- 58 yoF with OSA, complicated by HTN, SAR/ Asthma (followed by Allergist), LPR/GERD, Rheumatoid Arthritis, Food Allergy Anaphylaxis, MGUS,  Split Night Sleep Study 01/07/22- AHI 35.9/hr, desat to 73%, body weight 190 lbs, CPAP to 12 (5-15) - albuterol  neb, pulmicort  neb, albuterol , Dupixent       Methotrexate , Orencia , prednisone  2.5mg  daily/(esophageal lichen planus)  CPAP auto 5-15/ [facility]     ordered 03/02/22     Download compliance-73%, AHI 4/hr Body weight today-174 lbs Discussed the use of AI scribe software for clinical note transcription with the patient, who gave verbal consent to proceed.  History of Present Illness   Julie [name] is a 58 year old female with sleep apnea who presents for follow-up of her CPAP therapy.  Her CPAP machine is set to self-adjust between pressures of 5 and 15 cm H2O, with most usage around 13 cm H2O. She generally meets the target usage despite a few short nights and skip nights. Nasal congestion due to allergies occasionally prevents CPAP use, particularly when pollen levels are high. She continues treatment with an allergist, receiving Dupixent  injections biweekly, and uses a neti pot and saline spray for congestion management. Financial  concerns about CPAP supplies exist due to her husband's unemployment, leading to selective purchasing and use of surplus filters. We discussed on-line sources.     Assessment and Plan:    Obstructive Sleep Apnea Obstructive sleep  apnea is well-managed with CPAP set between 5-15 cm H2O, primarily at 13 cm H2O. Compliance is satisfactory. Financial constraints affect CPAP supply purchases. - Continue CPAP settings with pressure range between 5-15 cm H2O. - Advise purchasing CPAP supplies from online sources like [employer] to reduce costs.  Allergic Rhinitis Allergic rhinitis exacerbated by high pollen levels, causing nasal congestion affecting CPAP use. Managed with biweekly Dupixent  injections and additional measures. - Continue biweekly Dupixent  injections. - Continue using neti pot and saline rinses for nasal congestion. - Maintain follow-up with allergist.     ROS-see HPI   + = positive Constitutional:    +weight loss, night sweats, fevers, chills, fatigue, lassitude. HEENT:    headaches, difficulty swallowing, tooth/dental problems, sore throat,       sneezing, itching, ear ache, nasal congestion, post nasal drip, snoring CV:    chest pain, orthopnea, PND, swelling in lower extremities, anasarca,                                   \dizziness, palpitations Resp:   shortness of breath with exertion or at rest.                productive cough,   non-productive cough, coughing up of blood.              change in color of mucus.  wheezing.   Skin:    rash or lesions. GI:  No-   heartburn, indigestion, abdominal pain, nausea, vomiting, diarrhea,                 change in bowel habits, loss of appetite GU: dysuria, change in color of urine, no urgency or frequency.   flank pain. MS:   joint pain, stiffness, decreased range of motion, back pain. Neuro-     nothing unusual Psych:  change in mood or affect.  depression or anxiety.   memory loss.  OBJ- Physical Exam General- Alert, Oriented, Affect-appropriate, Distress- none acute, +obese Skin- rash-none, lesions- none, excoriation- none Lymphadenopathy- none Head- atraumatic            Eyes- Gross vision intact, PERRLA, conjunctivae and secretions clear             Ears- Hearing, canals-normal            Nose- Clear, no-Septal dev, mucus, polyps, erosion, perforation             Throat- Mallampati IV , mucosa clear , drainage- none, tonsils- atrophic, +teeth Neck- flexible , trachea midline, no stridor , thyroid nl, carotid no bruit Chest - symmetrical excursion , unlabored           Heart/CV- RRR , no murmur , no gallop  , no rub, nl s1 s2                           - JVD- none , edema- none, stasis changes- none, varices- none           Lung- clear to P&A, wheeze- none, cough- none , dullness-none, rub- none           Chest wall-  Abd-  Br/ Gen/  Rectal- Not done, not indicated Extrem- cyanosis- none, clubbing, none, atrophy- none, strength- nl Neuro- grossly intact to observation

## 2024-02-04 ENCOUNTER — Ambulatory Visit: Payer: 59 | Admitting: Internal Medicine

## 2024-02-04 ENCOUNTER — Encounter: Payer: Self-pay | Admitting: Internal Medicine

## 2024-02-04 VITALS — BP 124/83 | HR 82 | Temp 97.9°F | Resp 18 | Ht 61.0 in | Wt 174.2 lb

## 2024-02-04 DIAGNOSIS — G4733 Obstructive sleep apnea (adult) (pediatric): Secondary | ICD-10-CM

## 2024-02-04 DIAGNOSIS — L439 Lichen planus, unspecified: Secondary | ICD-10-CM

## 2024-02-04 DIAGNOSIS — Z9981 Dependence on supplemental oxygen: Secondary | ICD-10-CM

## 2024-02-04 NOTE — Patient Instructions (Signed)
 We can continue CPAP auto 5-15  You might look at [employer] for supplies

## 2024-02-07 ENCOUNTER — Other Ambulatory Visit (HOSPITAL_COMMUNITY): Payer: Self-pay

## 2024-02-07 ENCOUNTER — Other Ambulatory Visit: Payer: Self-pay

## 2024-02-07 MED FILL — Montelukast Sodium Tab 10 MG (Base Equiv): ORAL | 30 days supply | Qty: 30 | Fill #3 | Status: AC

## 2024-02-11 ENCOUNTER — Other Ambulatory Visit: Payer: Self-pay

## 2024-02-11 ENCOUNTER — Other Ambulatory Visit (HOSPITAL_COMMUNITY): Payer: Self-pay

## 2024-02-14 ENCOUNTER — Ambulatory Visit

## 2024-02-14 ENCOUNTER — Encounter: Payer: Self-pay | Admitting: Internal Medicine

## 2024-02-14 DIAGNOSIS — J4540 Moderate persistent asthma, uncomplicated: Secondary | ICD-10-CM | POA: Diagnosis not present

## 2024-02-23 ENCOUNTER — Other Ambulatory Visit: Payer: Self-pay | Admitting: Allergy & Immunology

## 2024-02-24 ENCOUNTER — Other Ambulatory Visit (HOSPITAL_COMMUNITY): Payer: Self-pay

## 2024-02-24 ENCOUNTER — Other Ambulatory Visit: Payer: Self-pay

## 2024-02-24 NOTE — Progress Notes (Unsigned)
 Office Visit Note  Patient: Julie [name]             Date of Birth: Sep 05, 1966           MRN: [id number]             PCP: Danella Dunn, MD Referring: Danella Dunn, MD Visit Date: 03/09/2024 Occupation: @GUAROCC @  Subjective:    History of Present Illness: Julie M Majid is a 58 y.o. female with history of seropositive rheumatoid arthritis.  Patient remains on Orencia  125 mg sq injections once weekly, Rasuvo  15 mg sq inj weekly,  and Plaquenil  200 mg 1 tablet BID Monday-Friday.   CBC and CMP updated on 01/20/24.  TB gold negative on 08/07/23  Lipid panel order released today.  Discussed the importance of holding orencia  and methotrexate  if she develops signs or symptoms of an infection and to resume once the infection has completely cleared.  PLQ Eye Exam: 12/21/2022 WNL [facility] Follow up in 1 year PLQ eye exam scheduled for 03/11/2024   Activities of Daily Living:  Patient reports morning stiffness for *** {minute/hour:19697}.   Patient {ACTIONS;DENIES/REPORTS:21021675::Denies} nocturnal pain.  Difficulty dressing/grooming: {ACTIONS;DENIES/REPORTS:21021675::Denies} Difficulty climbing stairs: {ACTIONS;DENIES/REPORTS:21021675::Denies} Difficulty getting out of chair: {ACTIONS;DENIES/REPORTS:21021675::Denies} Difficulty using hands for taps, buttons, cutlery, and/or writing: {ACTIONS;DENIES/REPORTS:21021675::Denies}  No Rheumatology ROS completed.   PMFS History:  Patient Active Problem List   Diagnosis Date Noted   Witnessed episode of apnea 01/07/2022   Viral illness 09/08/2021   Anaphylactic shock due to adverse food reaction 02/01/2020   Moderate persistent asthma without complication 11/18/2019   Complication of anesthesia    Fibromyalgia 02/14/2018   History of gastroesophageal reflux (GERD) 02/14/2018   History of asthma 02/14/2018   Rheumatoid arthritis involving multiple sites with positive rheumatoid factor (HCC)  11/05/2016   High risk medication use 11/05/2016   LPRD (laryngopharyngeal reflux disease) 09/20/2015   Seasonal and perennial allergic rhinoconjunctivitis 09/20/2015   Acute sinusitis 07/29/2015   S/P total hysterectomy 05/25/2015    Past Medical History:  Diagnosis Date   Anemia    Asthma    Complication of anesthesia    Eczema    Esophageal dysphagia    GERD (gastroesophageal reflux disease)    Hypertension    Lichen planus 2024   esophageal   PONV (postoperative nausea and vomiting)    Rheumatoid arthritis (HCC)     Family History  Problem Relation Age of Onset   Asthma Mother    Eczema Mother    Sarcoidosis Mother    Hypertension Mother    Heart attack Mother    Cancer Father    Hypotension Father    Congestive Heart Failure Father    Colon cancer Father    Multiple myeloma Father    Asthma Sister    GER disease Sister    Lung cancer Maternal Grandfather        smoked   Allergic rhinitis Neg Hx    Angioedema Neg Hx    Immunodeficiency Neg Hx    Urticaria Neg Hx    Esophageal cancer Neg Hx    Rectal cancer Neg Hx    Stomach cancer Neg Hx    Past Surgical History:  Procedure Laterality Date   ABDOMINAL HYSTERECTOMY     partial age 45   BILATERAL SALPINGECTOMY Bilateral 05/25/2015   Procedure: BILATERAL SALPINGECTOMY;  Surgeon: Ivery Marking, MD;  Location: [facility];  Service: Gynecology;  Laterality: Bilateral;   CARPAL TUNNEL RELEASE Left  01/05/2020   Procedure: LEFT CARPAL TUNNEL RELEASE;  Surgeon: Lyanne Sample, MD;  Location: [facility];  Service: Orthopedics;  Laterality: Left;  FOREARM BIER BLOCK   COLONOSCOPY     ESOPHAGEAL DILATION     x3 in 2024   OVARIAN CYST REMOVAL Right 05/25/2015   Procedure: OVARIAN CYSTECTOMY;  Surgeon: Ivery Marking, MD;  Location: [facility];  Service: Gynecology;  Laterality: Right;   TONSILLECTOMY     TUBAL LIGATION     Social History   Social History Narrative   Right handed    Caffeine- 2 cups  per day    Immunization History  Administered Date(s) Administered   Influenza Inj Mdck Quad Pf 10/19/2017   Influenza Split 11/25/2014   Influenza, Quadrivalent, Recombinant, Inj, Pf 08/26/2019   Influenza, Seasonal, Injecte, Preservative Fre 09/20/2015, 09/24/2023   Influenza,inj,Quad PF,6+ Mos 08/04/2018   Influenza-Unspecified 07/18/2017   PFIZER(Purple Top)SARS-COV-2 Vaccination 11/28/2019, 12/19/2019, 06/23/2020     Objective: Vital Signs: LMP 05/19/2015    Physical Exam Vitals and nursing note reviewed.  Constitutional:      Appearance: She is well-developed.  HENT:     Head: Normocephalic and atraumatic.   Eyes:     Conjunctiva/sclera: Conjunctivae normal.    Cardiovascular:     Rate and Rhythm: Normal rate and regular rhythm.     Heart sounds: Normal heart sounds.  Pulmonary:     Effort: Pulmonary effort is normal.     Breath sounds: Normal breath sounds.  Abdominal:     General: Bowel sounds are normal.     Palpations: Abdomen is soft.   Musculoskeletal:     Cervical back: Normal range of motion.  Lymphadenopathy:     Cervical: No cervical adenopathy.   Skin:    General: Skin is warm and dry.     Capillary Refill: Capillary refill takes less than 2 seconds.   Neurological:     Mental Status: She is alert and oriented to person, place, and time.   Psychiatric:        Behavior: Behavior normal.      Musculoskeletal Exam: ***  CDAI Exam: CDAI Score: -- Patient Global: --; Provider Global: -- Swollen: --; Tender: -- Joint Exam 03/09/2024   No joint exam has been documented for this visit   There is currently no information documented on the homunculus. Go to the Rheumatology activity and complete the homunculus joint exam.  Investigation: No additional findings.  Imaging: No results found.  Recent Labs: Lab Results  Component Value Date   WBC 3.6 (L) 01/20/2024   HGB 13.0 01/20/2024   PLT 231 01/20/2024   NA 140 01/20/2024   K 5.1  01/20/2024   CL 104 01/20/2024   CO2 29 01/20/2024   GLUCOSE 102 (H) 01/20/2024   BUN 16 01/20/2024   CREATININE 0.83 01/20/2024   BILITOT 0.4 01/20/2024   ALKPHOS 71 10/28/2023   AST 15 01/20/2024   ALT 14 01/20/2024   PROT 7.6 01/20/2024   ALBUMIN 4.4 10/28/2023   CALCIUM  9.5 01/20/2024   GFRAA 118 03/16/2021   QFTBGOLDPLUS NEGATIVE 08/07/2023    Speciality Comments: PLQ Eye Exam: 12/21/2022  [facility] Follow up in 1 year  -PLQ eye exam scheduled for 03/11/2024  Procedures:  No procedures performed Allergies: Peanut-containing drug products, Aspirin, and Augmentin  [amoxicillin -pot clavulanate]   Assessment / Plan:     Visit Diagnoses: Rheumatoid arthritis involving multiple sites with positive rheumatoid factor (HCC)  High risk medication use  Positive ANA (antinuclear antibody)  Anti-RNP antibodies present  Sicca syndrome (HCC)  Trigger middle finger of right hand  Fibromyalgia  Osteopenia of spine  Vitamin D  deficiency  History of Helicobacter pylori infection  History of gastroesophageal reflux (GERD)  History of asthma  Abnormal SPEP  History of depression  Lichen planus  Orders: No orders of the defined types were placed in this encounter.  No orders of the defined types were placed in this encounter.   Face-to-face time spent with patient was *** minutes. Greater than 50% of time was spent in counseling and coordination of care.  Follow-Up Instructions: No follow-ups on file.   Romayne Clubs, PA-C  Note - This record has been created using Dragon software.  Chart creation errors have been sought, but may not always  have been located. Such creation errors do not reflect on  the standard of medical care.

## 2024-02-25 ENCOUNTER — Other Ambulatory Visit (HOSPITAL_COMMUNITY): Payer: Self-pay

## 2024-02-25 ENCOUNTER — Other Ambulatory Visit: Payer: Self-pay

## 2024-02-25 MED ORDER — FAMOTIDINE 40 MG/5ML PO SUSR
40.0000 mg | Freq: Two times a day (BID) | ORAL | 0 refills | Status: DC
  Filled 2024-02-25: qty 100, 10d supply, fill #0
  Filled 2024-02-25: qty 200, 20d supply, fill #0

## 2024-02-28 ENCOUNTER — Ambulatory Visit (INDEPENDENT_AMBULATORY_CARE_PROVIDER_SITE_OTHER)

## 2024-02-28 ENCOUNTER — Other Ambulatory Visit (HOSPITAL_COMMUNITY): Payer: Self-pay

## 2024-02-28 DIAGNOSIS — J4540 Moderate persistent asthma, uncomplicated: Secondary | ICD-10-CM | POA: Diagnosis not present

## 2024-03-09 ENCOUNTER — Encounter: Payer: Self-pay | Admitting: Physician Assistant

## 2024-03-09 ENCOUNTER — Ambulatory Visit: Payer: 59 | Attending: Physician Assistant | Admitting: Physician Assistant

## 2024-03-09 VITALS — BP 117/76 | HR 80 | Resp 14 | Ht 61.25 in | Wt 174.0 lb

## 2024-03-09 DIAGNOSIS — M8588 Other specified disorders of bone density and structure, other site: Secondary | ICD-10-CM

## 2024-03-09 DIAGNOSIS — R768 Other specified abnormal immunological findings in serum: Secondary | ICD-10-CM

## 2024-03-09 DIAGNOSIS — Z8719 Personal history of other diseases of the digestive system: Secondary | ICD-10-CM

## 2024-03-09 DIAGNOSIS — Z8659 Personal history of other mental and behavioral disorders: Secondary | ICD-10-CM

## 2024-03-09 DIAGNOSIS — M797 Fibromyalgia: Secondary | ICD-10-CM

## 2024-03-09 DIAGNOSIS — M0579 Rheumatoid arthritis with rheumatoid factor of multiple sites without organ or systems involvement: Secondary | ICD-10-CM

## 2024-03-09 DIAGNOSIS — Z8619 Personal history of other infectious and parasitic diseases: Secondary | ICD-10-CM

## 2024-03-09 DIAGNOSIS — Z8709 Personal history of other diseases of the respiratory system: Secondary | ICD-10-CM

## 2024-03-09 DIAGNOSIS — M3500 Sicca syndrome, unspecified: Secondary | ICD-10-CM

## 2024-03-09 DIAGNOSIS — Z7952 Long term (current) use of systemic steroids: Secondary | ICD-10-CM

## 2024-03-09 DIAGNOSIS — E559 Vitamin D deficiency, unspecified: Secondary | ICD-10-CM

## 2024-03-09 DIAGNOSIS — Z79899 Other long term (current) drug therapy: Secondary | ICD-10-CM | POA: Diagnosis not present

## 2024-03-09 DIAGNOSIS — Z13220 Encounter for screening for lipoid disorders: Secondary | ICD-10-CM

## 2024-03-09 DIAGNOSIS — M65331 Trigger finger, right middle finger: Secondary | ICD-10-CM

## 2024-03-09 DIAGNOSIS — L439 Lichen planus, unspecified: Secondary | ICD-10-CM

## 2024-03-09 DIAGNOSIS — R778 Other specified abnormalities of plasma proteins: Secondary | ICD-10-CM

## 2024-03-09 NOTE — Patient Instructions (Signed)
 Standing Labs We placed an order today for your standing lab work.   Please have your standing labs drawn in August and every 3 months   Please have your labs drawn 2 weeks prior to your appointment so that the provider can discuss your lab results at your appointment, if possible.  Please note that you may see your imaging and lab results in MyChart before we have reviewed them. We will contact you once all results are reviewed. Please allow our office up to 72 hours to thoroughly review all of the results before contacting the office for clarification of your results.  WALK-IN LAB HOURS  Monday through Thursday from 8:00 am -12:30 pm and 1:00 pm-4:00 pm and Friday from 8:00 am-12:00 pm.  Patients with office visits requiring labs will be seen before walk-in labs.  You may encounter longer than normal wait times. Please allow additional time. Wait times may be shorter on  Monday and Thursday afternoons.  We do not book appointments for walk-in labs. We appreciate your patience and understanding with our staff.   Labs are drawn by [facility]. Please bring your co-pay at the time of your lab draw.  You may receive a bill from [facility] for your lab work.  Please note if you are on Hydroxychloroquine  and and an order has been placed for a Hydroxychloroquine  level,  you will need to have it drawn 4 hours or more after your last dose.  If you wish to have your labs drawn at another location, please call the office 24 hours in advance so we can fax the orders.  The office is located at [address], [address], [place], [place] [address]   If you have any questions regarding directions or hours of operation,  please call [phone].   As a reminder, please drink plenty of water prior to coming for your lab work. Thanks!

## 2024-03-12 ENCOUNTER — Ambulatory Visit (INDEPENDENT_AMBULATORY_CARE_PROVIDER_SITE_OTHER)

## 2024-03-12 ENCOUNTER — Encounter: Payer: Self-pay | Admitting: Allergy & Immunology

## 2024-03-12 DIAGNOSIS — J4540 Moderate persistent asthma, uncomplicated: Secondary | ICD-10-CM

## 2024-03-17 ENCOUNTER — Other Ambulatory Visit: Payer: Self-pay | Admitting: Physician Assistant

## 2024-03-17 ENCOUNTER — Other Ambulatory Visit (HOSPITAL_COMMUNITY): Payer: Self-pay

## 2024-03-17 MED ORDER — ESCITALOPRAM OXALATE 20 MG PO TABS
20.0000 mg | ORAL_TABLET | Freq: Every day | ORAL | 2 refills | Status: DC
  Filled 2024-03-17: qty 30, 30d supply, fill #0

## 2024-03-17 NOTE — Telephone Encounter (Signed)
 Last Fill: 08/09/2023  Labs: 01/20/2024 White cell count is low due to immunosuppression.  CMP is normal.   Next Visit: 08/10/2024  Last Visit: 03/09/2024  DX:  Rheumatoid arthritis involving multiple sites with positive rheumatoid factor   Current Dose per office note 03/09/2024: Rasuvo  15 mg sq inj weekly   Okay to refill Rasuvo ?

## 2024-03-21 ENCOUNTER — Other Ambulatory Visit: Payer: Self-pay | Admitting: Allergy & Immunology

## 2024-03-21 MED FILL — Montelukast Sodium Tab 10 MG (Base Equiv): ORAL | 30 days supply | Qty: 30 | Fill #4 | Status: AC

## 2024-03-23 ENCOUNTER — Other Ambulatory Visit (HOSPITAL_COMMUNITY): Payer: Self-pay

## 2024-03-23 ENCOUNTER — Other Ambulatory Visit: Payer: Self-pay

## 2024-03-25 ENCOUNTER — Other Ambulatory Visit (HOSPITAL_COMMUNITY): Payer: Self-pay

## 2024-03-25 MED ORDER — FAMOTIDINE 40 MG/5ML PO SUSR
40.0000 mg | Freq: Two times a day (BID) | ORAL | 0 refills | Status: DC
  Filled 2024-03-25 – 2024-04-30 (×2): qty 100, 10d supply, fill #0

## 2024-03-26 ENCOUNTER — Ambulatory Visit: Admitting: Allergy & Immunology

## 2024-03-26 ENCOUNTER — Ambulatory Visit

## 2024-03-26 DIAGNOSIS — J4550 Severe persistent asthma, uncomplicated: Secondary | ICD-10-CM

## 2024-04-03 ENCOUNTER — Other Ambulatory Visit (HOSPITAL_COMMUNITY): Payer: Self-pay

## 2024-04-08 NOTE — Patient Instructions (Incomplete)
 1. Moderate persistent asthma  - Daily controller medication(s): continue montelukast  10 mg once a day and Dupixent  300mg  every 2 weeks  - Prior to physical activity: albuterol  2 puffs 10-15 minutes before physical activity - Rescue medications: albuterol  4 puffs every 4-6 hours as needed or albuterol  nebulizer one vial puffs every 4-6 hours as needed  - Asthma control goals:  * Full participation in all desired activities (may need albuterol  before activity) * Albuterol  use two time or less a week on average (not counting use with activity) * Cough interfering with sleep two time or less a month * Oral steroids no more than once a year * No hospitalizations  2. LPRD (laryngopharyngeal reflux disease) - Continue dietary and lifestyle modifications as listed below - Continue omeprazole  daily - Continue with Pepcid  (famotidine ) 40mg  twice daily.   3. Seasonal allergic rhinitis Continue allergen avoidance measures directed toward grass pollen, weed pollen, tree pollen, mold, cat, dog, and dust mite as listed below - Continue with alternating antihistamines daily (change every 2-3 months). - Continue with Astelin  one spray per nostril up to twice daily as needed.  - Continue with Ayr nasal saline gel.   4. Food allergies - Continue to avoid peanuts and tree nuts.  Call the clinic if this treatment plan is not working well for you  Follow up in 6 months or sooner if needed.

## 2024-04-08 NOTE — Progress Notes (Unsigned)
   [address] [place] [address] Dept: [phone]  FOLLOW UP NOTE  Patient ID: Julie [name], female    DOB: 1966-03-22  Age: 58 y.o. MRN: [id number] Date of Office Visit: 04/09/2024  Assessment  Chief Complaint: No chief complaint on file.  HPI Julie M Reinhold   Discussed the use of AI scribe software for clinical note transcription with the patient, who gave verbal consent to proceed.  History of Present Illness Julie NELLI SWALLEY is a 58 year old female who presents with emotional distress and medication adjustment following the recent death of her stepfather.  She is currently taking Lexapro  20 mg, which she reports was prescribed because she has been going through a lot, including her stepfather's recent passing and her mother's ongoing health issues. She feels the dose is too strong, causing lightheadedness and grogginess. Her mother is experiencing involuntary movements and is on full-time oxygen, with attempts to wean her off some medications that may be causing these symptoms. She is concerned about her mother's health and the impact of her stepfather's passing, as he was a primary caregiver for her mother.  She mentions feeling winded and emotional, particularly when her Dupixent  is wearing off. She recently received a dose of Dupixent  and notes that she was a little winded a day ago. She uses albuterol  as needed, approximately twice in the past six months.  She is experiencing significant stress and sleep deprivation, working 14.5-hour days while managing family responsibilities. She reports drinking coffee and occasionally Dr. Nunzio to combat fatigue, but limits intake due to acid reflux. She has lost weight, currently weighing 165 pounds, down from 200 pounds.  She has a history of allergies, describing herself as 'allergic to the world,' with daily nasal congestion and sinus pressure. She uses Simply Saline or a neti pot regularly to prevent sinus infections. She  avoids Flonase  due to dryness and epistaxis, and uses a gel prescribed for nasal dryness.  She has a history of food allergies, including a recent episode with pecans that required her to use an EpiPen . She experiences symptoms such as clogged ears, nose, and throat when exposed to allergens.  She is scheduled for another endoscopy in August, which will be her fifth; she reports that previous procedures have not been able to achieve the desired stretching. She expresses concern about the financial burden of these procedures.     Drug Allergies:  Allergies  Allergen Reactions   Peanut-Containing Drug Products Anaphylaxis   Amoxicillin -Pot Clavulanate Other (See Comments)    G I upset  Augmentin    Aspirin Other (See Comments)    pt couldn't take while on methotrexate ,  Other Reaction(s): Not available  aspirin    Physical Exam: LMP 05/19/2015    Physical Exam  Diagnostics:    Assessment and Plan: No diagnosis found.  No orders of the defined types were placed in this encounter.   There are no Patient Instructions on file for this visit.  No follow-ups on file.    Thank you for the opportunity to care for this patient.  Please do not hesitate to contact me with questions.  Arlean Mutter, FNP [facility]

## 2024-04-09 ENCOUNTER — Other Ambulatory Visit: Payer: Self-pay

## 2024-04-09 ENCOUNTER — Ambulatory Visit

## 2024-04-09 ENCOUNTER — Ambulatory Visit: Admitting: Family Medicine

## 2024-04-09 ENCOUNTER — Encounter: Payer: Self-pay | Admitting: Family Medicine

## 2024-04-09 VITALS — BP 120/80 | HR 75 | Temp 97.1°F | Resp 18 | Ht 61.02 in | Wt 171.8 lb

## 2024-04-09 DIAGNOSIS — Z91018 Allergy to other foods: Secondary | ICD-10-CM

## 2024-04-09 DIAGNOSIS — K219 Gastro-esophageal reflux disease without esophagitis: Secondary | ICD-10-CM | POA: Diagnosis not present

## 2024-04-09 DIAGNOSIS — J4540 Moderate persistent asthma, uncomplicated: Secondary | ICD-10-CM | POA: Diagnosis not present

## 2024-04-09 DIAGNOSIS — J302 Other seasonal allergic rhinitis: Secondary | ICD-10-CM | POA: Diagnosis not present

## 2024-04-09 DIAGNOSIS — T7800XD Anaphylactic reaction due to unspecified food, subsequent encounter: Secondary | ICD-10-CM

## 2024-04-09 DIAGNOSIS — H1013 Acute atopic conjunctivitis, bilateral: Secondary | ICD-10-CM

## 2024-04-09 DIAGNOSIS — J3089 Other allergic rhinitis: Secondary | ICD-10-CM

## 2024-04-09 DIAGNOSIS — H1010 Acute atopic conjunctivitis, unspecified eye: Secondary | ICD-10-CM

## 2024-04-10 ENCOUNTER — Encounter: Payer: Self-pay | Admitting: Family Medicine

## 2024-04-10 ENCOUNTER — Other Ambulatory Visit (HOSPITAL_COMMUNITY): Payer: Self-pay

## 2024-04-10 MED ORDER — ESCITALOPRAM OXALATE 10 MG PO TABS
10.0000 mg | ORAL_TABLET | Freq: Every day | ORAL | 3 refills | Status: DC
  Filled 2024-04-10 – 2024-04-30 (×2): qty 30, 30d supply, fill #0

## 2024-04-21 ENCOUNTER — Other Ambulatory Visit (HOSPITAL_COMMUNITY): Payer: Self-pay

## 2024-04-24 ENCOUNTER — Ambulatory Visit

## 2024-04-24 DIAGNOSIS — J4540 Moderate persistent asthma, uncomplicated: Secondary | ICD-10-CM | POA: Diagnosis not present

## 2024-04-30 ENCOUNTER — Other Ambulatory Visit: Payer: Self-pay

## 2024-04-30 ENCOUNTER — Other Ambulatory Visit (HOSPITAL_COMMUNITY): Payer: Self-pay

## 2024-04-30 MED ORDER — HYDROXYCHLOROQUINE SULFATE 200 MG PO TABS
200.0000 mg | ORAL_TABLET | Freq: Two times a day (BID) | ORAL | 0 refills | Status: AC
  Filled 2024-04-30: qty 40, 28d supply, fill #0
  Filled 2024-06-29: qty 40, 28d supply, fill #1
  Filled 2024-07-24: qty 40, 28d supply, fill #2
  Filled 2024-09-07: qty 40, 28d supply, fill #3

## 2024-04-30 MED ORDER — FOLIC ACID 1 MG PO TABS
1.0000 mg | ORAL_TABLET | Freq: Two times a day (BID) | ORAL | 3 refills | Status: AC
  Filled 2024-04-30: qty 60, 30d supply, fill #0
  Filled 2024-04-30: qty 90, 45d supply, fill #0
  Filled 2024-06-29: qty 60, 30d supply, fill #1
  Filled 2024-07-24: qty 60, 30d supply, fill #2
  Filled 2024-09-07: qty 60, 30d supply, fill #3

## 2024-04-30 MED ORDER — AMLODIPINE BESYLATE 10 MG PO TABS
10.0000 mg | ORAL_TABLET | Freq: Every day | ORAL | 0 refills | Status: DC
  Filled 2024-04-30: qty 30, 30d supply, fill #0
  Filled 2024-06-29: qty 30, 30d supply, fill #1
  Filled 2024-07-24: qty 30, 30d supply, fill #2

## 2024-04-30 MED FILL — Cetirizine HCl Tab 10 MG: ORAL | 30 days supply | Qty: 30 | Fill #1 | Status: AC

## 2024-04-30 MED FILL — Montelukast Sodium Tab 10 MG (Base Equiv): ORAL | 30 days supply | Qty: 30 | Fill #5 | Status: AC

## 2024-04-30 NOTE — Telephone Encounter (Signed)
 Last Fill: 01/20/2024 (PLQ ) 10/09/2023 (folic acid)  Eye exam: 03/11/2024  WNL   Labs: 01/20/2024 White cell count is low due to immunosuppression.  CMP is normal.   Next Visit: 08/10/2024  Last Visit: 6/22025  IK:Myzlfjunpi arthritis involving multiple sites with positive rheumatoid factor   Current Dose per office note 03/09/2024: Plaquenil 200 mg 1 tablet BID Monday-Friday , folic acid not discussed  Okay to refill Plaquenil Folic acid?

## 2024-05-12 ENCOUNTER — Other Ambulatory Visit (HOSPITAL_COMMUNITY): Payer: Self-pay

## 2024-05-13 ENCOUNTER — Ambulatory Visit (INDEPENDENT_AMBULATORY_CARE_PROVIDER_SITE_OTHER)

## 2024-05-13 DIAGNOSIS — J4540 Moderate persistent asthma, uncomplicated: Secondary | ICD-10-CM | POA: Diagnosis not present

## 2024-05-18 ENCOUNTER — Encounter: Payer: Self-pay | Admitting: Allergy & Immunology

## 2024-05-19 ENCOUNTER — Other Ambulatory Visit: Payer: Self-pay

## 2024-05-19 ENCOUNTER — Other Ambulatory Visit (HOSPITAL_COMMUNITY): Payer: Self-pay

## 2024-05-19 MED ORDER — FAMOTIDINE 40 MG/5ML PO SUSR
40.0000 mg | Freq: Two times a day (BID) | ORAL | 5 refills | Status: AC
  Filled 2024-05-20: qty 300, 30d supply, fill #0
  Filled 2024-06-29: qty 300, 30d supply, fill #1
  Filled 2024-07-24: qty 300, 30d supply, fill #2
  Filled 2024-09-07: qty 300, 30d supply, fill #3
  Filled 2024-10-09: qty 150, 15d supply, fill #4
  Filled 2024-10-09: qty 300, 30d supply, fill #4
  Filled 2024-10-09: qty 150, 15d supply, fill #4
  Filled 2024-10-09: qty 300, 30d supply, fill #4

## 2024-05-19 MED ORDER — FAMOTIDINE 40 MG/5ML PO SUSR
40.0000 mg | Freq: Two times a day (BID) | ORAL | 5 refills | Status: DC
  Filled 2024-05-19: qty 100, 10d supply, fill #0

## 2024-05-20 ENCOUNTER — Other Ambulatory Visit (HOSPITAL_COMMUNITY): Payer: Self-pay

## 2024-05-21 ENCOUNTER — Other Ambulatory Visit (HOSPITAL_COMMUNITY): Payer: Self-pay

## 2024-05-27 ENCOUNTER — Ambulatory Visit

## 2024-05-27 DIAGNOSIS — J4540 Moderate persistent asthma, uncomplicated: Secondary | ICD-10-CM

## 2024-05-28 ENCOUNTER — Other Ambulatory Visit (HOSPITAL_COMMUNITY): Payer: Self-pay

## 2024-05-28 MED ORDER — PREDNISONE 2.5 MG PO TABS
5.0000 mg | ORAL_TABLET | Freq: Every day | ORAL | 3 refills | Status: AC
  Filled 2024-05-28: qty 60, 30d supply, fill #0
  Filled 2024-06-29: qty 60, 30d supply, fill #1
  Filled 2024-07-24: qty 60, 30d supply, fill #2
  Filled 2024-09-07: qty 60, 30d supply, fill #3
  Filled 2024-10-09 (×2): qty 60, 30d supply, fill #4

## 2024-06-03 ENCOUNTER — Other Ambulatory Visit: Payer: Self-pay | Admitting: Family Medicine

## 2024-06-03 DIAGNOSIS — Z1231 Encounter for screening mammogram for malignant neoplasm of breast: Secondary | ICD-10-CM

## 2024-06-10 ENCOUNTER — Ambulatory Visit

## 2024-06-11 ENCOUNTER — Ambulatory Visit (INDEPENDENT_AMBULATORY_CARE_PROVIDER_SITE_OTHER)

## 2024-06-11 DIAGNOSIS — J4540 Moderate persistent asthma, uncomplicated: Secondary | ICD-10-CM

## 2024-06-25 ENCOUNTER — Other Ambulatory Visit (HOSPITAL_COMMUNITY): Payer: Self-pay

## 2024-06-25 ENCOUNTER — Ambulatory Visit

## 2024-06-25 DIAGNOSIS — J4550 Severe persistent asthma, uncomplicated: Secondary | ICD-10-CM | POA: Diagnosis not present

## 2024-06-25 MED ORDER — MONTELUKAST SODIUM 10 MG PO TABS
10.0000 mg | ORAL_TABLET | Freq: Every evening | ORAL | 1 refills | Status: AC
  Filled 2024-06-25: qty 30, 30d supply, fill #0
  Filled 2024-07-24: qty 30, 30d supply, fill #1
  Filled 2024-09-07: qty 30, 30d supply, fill #2
  Filled 2024-10-09: qty 30, 30d supply, fill #3

## 2024-06-25 MED ORDER — OMEPRAZOLE 40 MG PO CPDR
40.0000 mg | DELAYED_RELEASE_CAPSULE | Freq: Two times a day (BID) | ORAL | 1 refills | Status: AC
  Filled 2024-06-25: qty 60, 30d supply, fill #0
  Filled 2024-07-24: qty 60, 30d supply, fill #1
  Filled 2024-09-07: qty 60, 30d supply, fill #2
  Filled 2024-10-09: qty 60, 30d supply, fill #3

## 2024-06-29 ENCOUNTER — Other Ambulatory Visit (HOSPITAL_COMMUNITY): Payer: Self-pay

## 2024-06-29 ENCOUNTER — Other Ambulatory Visit: Payer: Self-pay | Admitting: *Deleted

## 2024-06-29 DIAGNOSIS — Z9225 Personal history of immunosupression therapy: Secondary | ICD-10-CM

## 2024-06-29 DIAGNOSIS — Z13220 Encounter for screening for lipoid disorders: Secondary | ICD-10-CM

## 2024-06-29 DIAGNOSIS — Z7952 Long term (current) use of systemic steroids: Secondary | ICD-10-CM

## 2024-06-29 DIAGNOSIS — Z79899 Other long term (current) drug therapy: Secondary | ICD-10-CM

## 2024-06-29 DIAGNOSIS — M0579 Rheumatoid arthritis with rheumatoid factor of multiple sites without organ or systems involvement: Secondary | ICD-10-CM

## 2024-06-29 DIAGNOSIS — Z111 Encounter for screening for respiratory tuberculosis: Secondary | ICD-10-CM

## 2024-06-29 MED FILL — Cetirizine HCl Tab 10 MG: ORAL | 30 days supply | Qty: 30 | Fill #2 | Status: AC

## 2024-06-30 ENCOUNTER — Ambulatory Visit: Payer: Self-pay | Admitting: Physician Assistant

## 2024-06-30 ENCOUNTER — Ambulatory Visit: Payer: Self-pay | Admitting: Rheumatology

## 2024-06-30 ENCOUNTER — Other Ambulatory Visit (HOSPITAL_COMMUNITY): Payer: Self-pay

## 2024-06-30 NOTE — Progress Notes (Signed)
 Lipid panel WNL Hgb A1c is 6.0%-please notify the patient and forward results to PCP

## 2024-07-01 ENCOUNTER — Other Ambulatory Visit: Payer: Self-pay | Admitting: Rheumatology

## 2024-07-01 ENCOUNTER — Ambulatory Visit
Admission: RE | Admit: 2024-07-01 | Discharge: 2024-07-01 | Disposition: A | Discharge status: Home / Self Care | Source: Ambulatory Visit | Attending: Family Medicine | Admitting: Family Medicine

## 2024-07-01 DIAGNOSIS — Z1231 Encounter for screening mammogram for malignant neoplasm of breast: Secondary | ICD-10-CM

## 2024-07-01 LAB — QUANTIFERON-TB GOLD PLUS
Mitogen-NIL: 6.66 [IU]/mL
NIL: 0.02 [IU]/mL
QuantiFERON-TB Gold Plus: NEGATIVE
TB1-NIL: 0 [IU]/mL
TB2-NIL: 0 [IU]/mL

## 2024-07-01 LAB — CBC WITH DIFFERENTIAL/PLATELET
Absolute Lymphocytes: 2434 {cells}/uL (ref 850–3900)
Absolute Monocytes: 400 {cells}/uL (ref 200–950)
Basophils Absolute: 42 {cells}/uL (ref 0–200)
Basophils Relative: 0.8 %
Eosinophils Absolute: 109 {cells}/uL (ref 15–500)
Eosinophils Relative: 2.1 %
HCT: 39.1 % (ref 35.0–45.0)
Hemoglobin: 12.9 g/dL (ref 11.7–15.5)
MCH: 29.1 pg (ref 27.0–33.0)
MCHC: 33 g/dL (ref 32.0–36.0)
MCV: 88.3 fL (ref 80.0–100.0)
MPV: 10.5 fL (ref 7.5–12.5)
Monocytes Relative: 7.7 %
Neutro Abs: 2215 {cells}/uL (ref 1500–7800)
Neutrophils Relative %: 42.6 %
Platelets: 235 Thousand/uL (ref 140–400)
RBC: 4.43 Million/uL (ref 3.80–5.10)
RDW: 12.2 % (ref 11.0–15.0)
Total Lymphocyte: 46.8 %
WBC: 5.2 Thousand/uL (ref 3.8–10.8)

## 2024-07-01 LAB — COMPREHENSIVE METABOLIC PANEL WITH GFR
AG Ratio: 1.6 (calc) (ref 1.0–2.5)
ALT: 14 U/L (ref 6–29)
AST: 15 U/L (ref 10–35)
Albumin: 4.6 g/dL (ref 3.6–5.1)
Alkaline phosphatase (APISO): 66 U/L (ref 37–153)
BUN: 16 mg/dL (ref 7–25)
CO2: 31 mmol/L (ref 20–32)
Calcium: 9.6 mg/dL (ref 8.6–10.4)
Chloride: 103 mmol/L (ref 98–110)
Creat: 0.82 mg/dL (ref 0.50–1.03)
Globulin: 2.8 g/dL (ref 1.9–3.7)
Glucose, Bld: 79 mg/dL (ref 65–99)
Potassium: 4.3 mmol/L (ref 3.5–5.3)
Sodium: 140 mmol/L (ref 135–146)
Total Bilirubin: 0.3 mg/dL (ref 0.2–1.2)
Total Protein: 7.4 g/dL (ref 6.1–8.1)
eGFR: 84 mL/min/1.73m2 (ref >=60)

## 2024-07-01 LAB — HEMOGLOBIN A1C
Hgb A1c MFr Bld: 6 % — ABNORMAL HIGH (ref <5.7)
Mean Plasma Glucose: 126 mg/dL
eAG (mmol/L): 7 mmol/L

## 2024-07-01 LAB — LIPID PANEL
Cholesterol: 195 mg/dL (ref <200)
HDL: 104 mg/dL (ref >=50)
LDL Cholesterol (Calc): 71 mg/dL
Non-HDL Cholesterol (Calc): 91 mg/dL (ref <130)
Total CHOL/HDL Ratio: 1.9 (calc) (ref <5.0)
Triglycerides: 114 mg/dL (ref <150)

## 2024-07-01 NOTE — Progress Notes (Signed)
 TB gold negative

## 2024-07-01 NOTE — Progress Notes (Signed)
 TB Gold is negative.  Hemoglobin A1c elevated at 6.0, lipid panel normal.  Please forward results to his PCP.

## 2024-07-01 NOTE — Telephone Encounter (Signed)
 Last Fill: 03/17/2024  Labs: 06/29/2024 CBC/CMP WNL  Next Visit: 08/10/2024  Last Visit: 03/09/2024  DX: Rheumatoid arthritis involving multiple sites with positive rheumatoid factor   Current Dose per office note 03/09/2024: Rasuvo  15 mg sq inj weekly   Okay to refill Rasuvo ?

## 2024-07-08 ENCOUNTER — Ambulatory Visit (INDEPENDENT_AMBULATORY_CARE_PROVIDER_SITE_OTHER)

## 2024-07-08 DIAGNOSIS — J4540 Moderate persistent asthma, uncomplicated: Secondary | ICD-10-CM

## 2024-07-23 ENCOUNTER — Ambulatory Visit

## 2024-07-23 DIAGNOSIS — J4540 Moderate persistent asthma, uncomplicated: Secondary | ICD-10-CM

## 2024-07-24 ENCOUNTER — Other Ambulatory Visit (HOSPITAL_COMMUNITY): Payer: Self-pay

## 2024-07-25 ENCOUNTER — Other Ambulatory Visit: Payer: Self-pay | Admitting: Rheumatology

## 2024-07-25 DIAGNOSIS — M0579 Rheumatoid arthritis with rheumatoid factor of multiple sites without organ or systems involvement: Secondary | ICD-10-CM

## 2024-07-27 NOTE — Telephone Encounter (Signed)
 Last Fill: 01/20/2024  Labs: 06/29/2024 CBC & CMP WNL   TB Gold: 06/29/2024 negative    Next Visit: 08/10/2024  Last Visit: 03/09/2024  DX: Rheumatoid arthritis involving multiple sites with positive rheumatoid factor   Current Dose per office note on 03/09/2024: Orencia  125 mg sq injections once weekly   Okay to refill Orencia ?

## 2024-07-27 NOTE — Progress Notes (Unsigned)
 Office Visit Note  Patient: Julie [name]             Date of Birth: 12-Sep-1966           MRN: [id number]             PCP: Ilah Crigler, MD Referring: Ilah Crigler, MD Visit Date: 08/10/2024 Occupation: [employer]  Subjective:  Right IT band syndrome   History of Present Illness: Julie [name] is a 58 y.o. female with history of rheumatoid arthritis and fibromyalgia.  Patient remains on  Orencia  125 mg sq injections once weekly, Rasuvo  15 mg sq inj weekly and Plaquenil  200 mg 1 tablet BID Monday-Friday.  She is rating combination therapy without any side effects.  Patient has had occasional small gaps in therapy around the time of receiving vaccines or Dupixent .  She denies any signs or symptoms of a rheumatoid arthritis flare.  She denies any joint swelling at this time.  Patient states for the past 2 weeks she has been experiencing right-sided hip pain down the lateral aspect.  She denies injury or fall for the onset of symptoms.  She denies any nocturnal pain.  Patient states that she used to have flares of right hip pain and would go to massage therapy which would alleviate her symptoms.  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  She continues have occasional myalgias and muscle tenderness due to fibromyalgia. She is taking prednisone  5 mg daily for management of lichen planus. She states the dose of prednisone  was increased from 2.5 to 5 mg in Augsut 2025.     Activities of Daily Living:  Patient reports morning stiffness for 0 minute.   Patient Reports nocturnal pain.  Difficulty dressing/grooming: Denies Difficulty climbing stairs: Denies Difficulty getting out of chair: Denies Difficulty using hands for taps, buttons, cutlery, and/or writing: Denies  Review of Systems  Constitutional:  Negative for fatigue.  HENT:  Negative for mouth sores and mouth dryness.   Eyes:  Negative for dryness.  Respiratory:   Negative for shortness of breath.   Cardiovascular:  Negative for chest pain and palpitations.  Gastrointestinal:  Positive for constipation. Negative for blood in stool and diarrhea.  Endocrine: Negative for increased urination.  Genitourinary:  Negative for involuntary urination.  Musculoskeletal:  Positive for muscle tenderness. Negative for joint pain, gait problem, joint pain, joint swelling, myalgias, muscle weakness, morning stiffness and myalgias.  Skin:  Negative for color change, rash, hair loss and sensitivity to sunlight.  Allergic/Immunologic: Positive for susceptible to infections.  Neurological:  Positive for headaches. Negative for dizziness.  Hematological:  Negative for swollen glands.  Psychiatric/Behavioral:  Positive for sleep disturbance. Negative for depressed mood. The patient is nervous/anxious.     PMFS History:  Patient Active Problem List   Diagnosis Date Noted   Witnessed episode of apnea 01/07/2022   Viral illness 09/08/2021   Anaphylactic shock due to adverse food reaction 02/01/2020   Moderate persistent asthma without complication 11/18/2019   Complication of anesthesia    Fibromyalgia 02/14/2018   History of gastroesophageal reflux (GERD) 02/14/2018   History of asthma 02/14/2018   Rheumatoid arthritis involving multiple sites with positive rheumatoid factor (HCC) 11/05/2016   High risk medication use 11/05/2016   LPRD (laryngopharyngeal reflux disease) 09/20/2015   Seasonal and perennial allergic rhinoconjunctivitis 09/20/2015   Acute sinusitis 07/29/2015   S/P total hysterectomy 05/25/2015    Past Medical History:  Diagnosis Date   Anemia    Asthma    Complication of anesthesia    Eczema    Esophageal dysphagia    GERD (gastroesophageal reflux disease)    Hypertension    Lichen planus 2024   esophageal   PONV (postoperative nausea and vomiting)    Rheumatoid arthritis (HCC)     Family History  Problem Relation Age of Onset   Asthma  Mother    Eczema Mother    Sarcoidosis Mother    Hypertension Mother    Heart attack Mother    Cancer Father    Hypotension Father    Congestive Heart Failure Father    Colon cancer Father    Multiple myeloma Father    Asthma Sister    GER disease Sister    Lung cancer Maternal Grandfather        smoked   Allergic rhinitis Neg Hx    Angioedema Neg Hx    Immunodeficiency Neg Hx    Urticaria Neg Hx    Esophageal cancer Neg Hx    Rectal cancer Neg Hx    Stomach cancer Neg Hx    Past Surgical History:  Procedure Laterality Date   ABDOMINAL HYSTERECTOMY     partial age 4   BILATERAL SALPINGECTOMY Bilateral 05/25/2015   Procedure: BILATERAL SALPINGECTOMY;  Surgeon: Dickie Carder, MD;  Location: [facility];  Service: Gynecology;  Laterality: Bilateral;   CARPAL TUNNEL RELEASE Left 01/05/2020   Procedure: LEFT CARPAL TUNNEL RELEASE;  Surgeon: Murrell Kuba, MD;  Location: [facility];  Service: Orthopedics;  Laterality: Left;  FOREARM BIER BLOCK   COLONOSCOPY     ESOPHAGEAL DILATION     x3 in 2024   OVARIAN CYST REMOVAL Right 05/25/2015   Procedure: OVARIAN CYSTECTOMY;  Surgeon: Dickie Carder, MD;  Location: [facility];  Service: Gynecology;  Laterality: Right;   TONSILLECTOMY     TUBAL LIGATION     Social History   Tobacco Use   Smoking status: Never    Passive exposure: Never   Smokeless tobacco: Never  Vaping Use   Vaping status: Never Used  Substance Use Topics   Alcohol use: No   Drug use: No   Social History   Social History Narrative   Right handed    Caffeine- 2 cups per day      Immunization History  Administered Date(s) Administered   Influenza Inj Mdck Quad Pf 10/19/2017   Influenza Split 11/25/2014   Influenza, Quadrivalent, Recombinant, Inj, Pf 08/26/2019   Influenza, Seasonal, Injecte, Preservative Fre 09/20/2015, 09/24/2023   Influenza,inj,Quad PF,6+ Mos 08/04/2018   Influenza-Unspecified 07/18/2017   PFIZER(Purple Top)SARS-COV-2  Vaccination 11/28/2019, 12/19/2019, 06/23/2020     Objective: Vital Signs: BP 119/81   Pulse 80   Temp 97.9 F (36.6 C)   Resp 12   Ht 5' 1 (1.549 m)   Wt 173 lb 9.6 oz (78.7 kg)   LMP 05/19/2015   BMI 32.80 kg/m    Physical Exam Vitals and nursing note reviewed.  Constitutional:      Appearance: She is well-developed.  HENT:     Head: Normocephalic and atraumatic.  Eyes:     Conjunctiva/sclera: Conjunctivae normal.  Cardiovascular:     Rate and Rhythm: Normal rate and regular rhythm.     Heart sounds: Normal heart sounds.  Pulmonary:     Effort: Pulmonary effort is normal.     Breath sounds: Normal breath sounds.  Abdominal:     General: Bowel sounds  are normal.     Palpations: Abdomen is soft.  Musculoskeletal:     Cervical back: Normal range of motion.  Lymphadenopathy:     Cervical: No cervical adenopathy.  Skin:    General: Skin is warm and dry.     Capillary Refill: Capillary refill takes less than 2 seconds.  Neurological:     Mental Status: She is alert and oriented to person, place, and time.  Psychiatric:        Behavior: Behavior normal.      Musculoskeletal Exam: C-spine, thoracic spine, lumbar spine have good range of motion.  No midline spinal tenderness.  No SI joint tenderness.  Trapezius muscle tension and tenderness bilaterally.  Shoulder joints, elbow joints, wrist joints, MCPs, PIPs, DIPs have good range of motion with no synovitis.  Complete fist formation bilaterally.  Hip joints have good range of motion with no groin pain.  Knee joints have good range of motion no warmth or effusion.  Ankle joints have good range of motion no tenderness or joint swelling.  No evidence of Achilles tendinitis or plantar fasciitis. Tenderness over the trochanteric bursa of the right hip and along the IT band.   CDAI Exam: CDAI Score: -- Patient Global: --; Provider Global: -- Swollen: --; Tender: -- Joint Exam 08/10/2024   No joint exam has been documented  for this visit   There is currently no information documented on the homunculus. Go to the Rheumatology activity and complete the homunculus joint exam.  Investigation: No additional findings.  Imaging: No results found.   Recent Labs: Lab Results  Component Value Date   WBC 5.2 06/29/2024   HGB 12.9 06/29/2024   PLT 235 06/29/2024   NA 140 06/29/2024   K 4.3 06/29/2024   CL 103 06/29/2024   CO2 31 06/29/2024   GLUCOSE 79 06/29/2024   BUN 16 06/29/2024   CREATININE 0.82 06/29/2024   BILITOT 0.3 06/29/2024   ALKPHOS 71 10/28/2023   AST 15 06/29/2024   ALT 14 06/29/2024   PROT 7.4 06/29/2024   ALBUMIN 4.4 10/28/2023   CALCIUM  9.6 06/29/2024   GFRAA 118 03/16/2021   QFTBGOLDPLUS NEGATIVE 06/29/2024    Speciality Comments: PLQ Eye Exam: 03/11/2024  [facility] Follow up in 1 year  -PLQ eye exam scheduled for 03/11/2024  Procedures:  No procedures performed Allergies: Peanut-containing drug products, Amoxicillin -pot clavulanate, and Aspirin    Assessment / Plan:     Visit Diagnoses: Rheumatoid arthritis involving multiple sites with positive rheumatoid factor (HCC): She has no synovitis on examination today.  She has not had any signs or symptoms of a rheumatoid arthritis flare.  She is clinically been doing well on Orencia  125 mg subcu injections once weekly, Rasuvo  15 mg subcu injections once weekly, Plaquenil  200 mg 1 tablet by mouth twice daily Monday to Friday.  She is tolerating combination therapy without any side effects.  She is taking prednisone  5 mg daily which has been prescribed by dermatology for esophageal lichen planus. She has not been experiencing any morning stiffness and has no difficulty performing ADLs.  No medication changes will be made at this time.  She was advised to notify us  if she develops any signs or symptoms of a flare.  She will follow-up in the office in 5 months or sooner if needed.  High risk medication use - Orencia  125 mg sq  injections once weekly, Rasuvo  15 mg sq inj weekly,  Plaquenil  200 mg 1 tablet BID Monday-Friday.  Prednisone  5 mg daily-by dermatology. CBC and CMP updated on 06/29/24.  Her next lab work will be due in January and every 3 months.  TB gold negative on 06/29/24. Discussed the importance of holding orencia  and rasuvo  if she develops signs or symptoms of an infection and to resume once the infection has completely cleared.  PLQ Eye Exam: 03/11/2024 [facility] Follow up in 1 year   Long term systemic steroid user - Prednisone  5 mg by mouth daily--prescribed by dermatologist for management of esophageal lichen planus.  Patient is aware of the risks of long-term prednisone  use.  Positive ANA (antinuclear antibody) -07/13/20: ANA is positive-low, nonspecific titer. RNP remains positive. Complements WNL. dsDNA negative.  Anti-RNP antibodies present - RNP 2.8 on 06/30/21.  No signs of sclerodactyly.  Sicca syndrome: Sicca symptoms are currently well-controlled.  Fibromyalgia: She continues to experience intermittent myalgias and muscle tenderness due to fibromyalgia.  Referral to integrative therapies will be placed today.  Discussed importance of regular exercise and good sleep hygiene.  Trochanteric bursitis, right hip: Patient presents today with right sided hip pain for the past 2 weeks.  No injury or fall for the onset of symptoms.  She has tenderness over the right trochanter bursa and along the IT band.  She has tried home exercises.  She has benefited from massage therapy in the past.  Plan to place referral to and agreed of therapies.  Osteopenia of spine - Previous DEXA 08/24/2019: AP spine BMD 0.909 with T score -1.3.   DEXA updated on 09/12/22: AP spine BMD 0.808 with T-score -2.2. decrease in BMD AP spine. New order for DEXA placed today.    Vitamin D  deficiency  Other medical conditions are listed as follows:   History of Helicobacter pylori infection  History of  gastroesophageal reflux (GERD)  History of asthma  Abnormal SPEP  History of depression  Orders: Orders Placed This Encounter  Procedures   DG BONE DENSITY (DXA)   No orders of the defined types were placed in this encounter.   Follow-Up Instructions: Return in about 5 months (around 01/08/2025) for Rheumatoid arthritis.   Waddell CHRISTELLA Craze, PA-C  Note - This record has been created using Dragon software.  Chart creation errors have been sought, but may not always  have been located. Such creation errors do not reflect on  the standard of medical care.

## 2024-08-07 ENCOUNTER — Ambulatory Visit

## 2024-08-07 DIAGNOSIS — J4540 Moderate persistent asthma, uncomplicated: Secondary | ICD-10-CM

## 2024-08-10 ENCOUNTER — Other Ambulatory Visit: Payer: Self-pay

## 2024-08-10 ENCOUNTER — Encounter: Payer: Self-pay | Admitting: Physician Assistant

## 2024-08-10 ENCOUNTER — Ambulatory Visit: Attending: Physician Assistant | Admitting: Physician Assistant

## 2024-08-10 VITALS — BP 119/81 | HR 80 | Temp 97.9°F | Resp 12 | Ht 61.0 in | Wt 173.6 lb

## 2024-08-10 DIAGNOSIS — M8588 Other specified disorders of bone density and structure, other site: Secondary | ICD-10-CM

## 2024-08-10 DIAGNOSIS — Z8659 Personal history of other mental and behavioral disorders: Secondary | ICD-10-CM

## 2024-08-10 DIAGNOSIS — M0579 Rheumatoid arthritis with rheumatoid factor of multiple sites without organ or systems involvement: Secondary | ICD-10-CM

## 2024-08-10 DIAGNOSIS — M797 Fibromyalgia: Secondary | ICD-10-CM

## 2024-08-10 DIAGNOSIS — Z7952 Long term (current) use of systemic steroids: Secondary | ICD-10-CM

## 2024-08-10 DIAGNOSIS — M7061 Trochanteric bursitis, right hip: Secondary | ICD-10-CM

## 2024-08-10 DIAGNOSIS — M65331 Trigger finger, right middle finger: Secondary | ICD-10-CM

## 2024-08-10 DIAGNOSIS — R7689 Other specified abnormal immunological findings in serum: Secondary | ICD-10-CM | POA: Diagnosis not present

## 2024-08-10 DIAGNOSIS — R778 Other specified abnormalities of plasma proteins: Secondary | ICD-10-CM

## 2024-08-10 DIAGNOSIS — Z8709 Personal history of other diseases of the respiratory system: Secondary | ICD-10-CM

## 2024-08-10 DIAGNOSIS — Z8619 Personal history of other infectious and parasitic diseases: Secondary | ICD-10-CM

## 2024-08-10 DIAGNOSIS — E559 Vitamin D deficiency, unspecified: Secondary | ICD-10-CM

## 2024-08-10 DIAGNOSIS — Z79899 Other long term (current) drug therapy: Secondary | ICD-10-CM

## 2024-08-10 DIAGNOSIS — Z8719 Personal history of other diseases of the digestive system: Secondary | ICD-10-CM

## 2024-08-10 DIAGNOSIS — M3500 Sicca syndrome, unspecified: Secondary | ICD-10-CM

## 2024-08-10 NOTE — Patient Instructions (Signed)
 Standing Labs We placed an order today for your standing lab work.   Please have your standing labs drawn in Mid-January and every 3 months    Please have your labs drawn 2 weeks prior to your appointment so that the provider can discuss your lab results at your appointment, if possible.  Please note that you may see your imaging and lab results in MyChart before we have reviewed them. We will contact you once all results are reviewed. Please allow our office up to 72 hours to thoroughly review all of the results before contacting the office for clarification of your results.  WALK-IN LAB HOURS  Monday through Thursday from 8:00 am - 4:30 pm and Friday from 8:00 am-12:00 pm.  Patients with office visits requiring labs will be seen before walk-in labs.  You may encounter longer than normal wait times. Please allow additional time. Wait times may be shorter on  Monday and Thursday afternoons.  We do not book appointments for walk-in labs. We appreciate your patience and understanding with our staff.   Labs are drawn by [facility]. Please bring your co-pay at the time of your lab draw.  You may receive a bill from [facility] for your lab work.  Please note if you are on Hydroxychloroquine and and an order has been placed for a Hydroxychloroquine level,  you will need to have it drawn 4 hours or more after your last dose.  If you wish to have your labs drawn at another location, please call the office 24 hours in advance so we can fax the orders.  The office is located at [address], [place], [place] [address]   If you have any questions regarding directions or hours of operation,  please call [phone].   As a reminder, please drink plenty of water prior to coming for your lab work. Thanks!

## 2024-08-10 NOTE — Progress Notes (Unsigned)
 Per Waddell Craze, place referral to [facility].   Per Waddell Craze, call patient to advise DEXA order has been placed. Contacted the patient and advised order has been placed. Patient verbalized understanding.

## 2024-08-17 ENCOUNTER — Telehealth: Payer: Self-pay | Admitting: Rheumatology

## 2024-08-17 NOTE — Telephone Encounter (Signed)
 Patient called to let the office know that when she ordered her Orencia  medication she was told that a new prescription was needed.

## 2024-08-17 NOTE — Telephone Encounter (Signed)
 Called patient and advised that a new rx was sent to the pharmacy on 07/27/2024 (30 day supply with 2 refills). According to the dispense history, the rx was filled today. I advised patient and she verbalized understanding.

## 2024-08-27 ENCOUNTER — Ambulatory Visit (INDEPENDENT_AMBULATORY_CARE_PROVIDER_SITE_OTHER)

## 2024-08-27 DIAGNOSIS — J4540 Moderate persistent asthma, uncomplicated: Secondary | ICD-10-CM

## 2024-09-07 ENCOUNTER — Other Ambulatory Visit: Payer: Self-pay

## 2024-09-07 ENCOUNTER — Other Ambulatory Visit (HOSPITAL_COMMUNITY): Payer: Self-pay

## 2024-09-07 MED FILL — Cetirizine HCl Tab 10 MG: ORAL | 30 days supply | Qty: 30 | Fill #3 | Status: AC

## 2024-09-08 ENCOUNTER — Other Ambulatory Visit: Payer: Self-pay

## 2024-09-08 ENCOUNTER — Other Ambulatory Visit (HOSPITAL_COMMUNITY): Payer: Self-pay

## 2024-09-08 MED ORDER — AMLODIPINE BESYLATE 10 MG PO TABS
10.0000 mg | ORAL_TABLET | Freq: Every day | ORAL | 1 refills | Status: AC
  Filled 2024-09-08: qty 30, 30d supply, fill #0

## 2024-09-09 ENCOUNTER — Other Ambulatory Visit (HOSPITAL_COMMUNITY): Payer: Self-pay

## 2024-09-16 ENCOUNTER — Ambulatory Visit

## 2024-09-23 ENCOUNTER — Other Ambulatory Visit (HOSPITAL_COMMUNITY): Payer: Self-pay

## 2024-09-23 MED ORDER — AZITHROMYCIN 250 MG PO TABS
ORAL_TABLET | ORAL | 1 refills | Status: AC
  Filled 2024-09-23: qty 6, 5d supply, fill #0

## 2024-09-24 ENCOUNTER — Ambulatory Visit

## 2024-09-24 DIAGNOSIS — J4540 Moderate persistent asthma, uncomplicated: Secondary | ICD-10-CM

## 2024-09-29 ENCOUNTER — Other Ambulatory Visit: Payer: Self-pay | Admitting: Physician Assistant

## 2024-09-29 DIAGNOSIS — M0579 Rheumatoid arthritis with rheumatoid factor of multiple sites without organ or systems involvement: Secondary | ICD-10-CM

## 2024-10-09 ENCOUNTER — Other Ambulatory Visit (HOSPITAL_COMMUNITY): Payer: Self-pay

## 2024-10-09 ENCOUNTER — Telehealth: Payer: Self-pay

## 2024-10-09 MED FILL — Cetirizine HCl Tab 10 MG: ORAL | 30 days supply | Qty: 30 | Fill #4 | Status: AC

## 2024-10-09 NOTE — Telephone Encounter (Signed)
 Received DEXA results from [facility].  Date of Scan: 10/08/2024  Lowest T-score: -2.4  BMD: 0.782  Lowest site measured: Spine  DX: Osteopenia  Significant changes in BMD and site measured (5% and above): N/A  Current Regimen: Calcium , Vitamin D   Recommendation: Calcium , Vitamin D , and resistive exercises  Reviewed by: Waddell Craze, PA-C  Next Appointment:  01/07/2025  Patient advised Calcium , Vitamin D , and resistive exercises. Patient verbalized understanding.

## 2024-10-12 ENCOUNTER — Other Ambulatory Visit: Payer: Self-pay

## 2024-10-13 ENCOUNTER — Ambulatory Visit

## 2024-10-13 ENCOUNTER — Ambulatory Visit: Admitting: Allergy & Immunology

## 2024-10-15 ENCOUNTER — Other Ambulatory Visit (HOSPITAL_COMMUNITY): Payer: Self-pay

## 2024-10-15 ENCOUNTER — Encounter: Payer: Self-pay | Admitting: Allergy & Immunology

## 2024-10-15 ENCOUNTER — Ambulatory Visit: Admitting: Allergy & Immunology

## 2024-10-15 ENCOUNTER — Other Ambulatory Visit: Payer: Self-pay

## 2024-10-15 ENCOUNTER — Ambulatory Visit

## 2024-10-15 ENCOUNTER — Other Ambulatory Visit: Payer: Self-pay | Admitting: Allergy & Immunology

## 2024-10-15 VITALS — BP 104/70 | HR 80 | Temp 98.0°F | Resp 16 | Ht 62.0 in | Wt 176.8 lb

## 2024-10-15 DIAGNOSIS — J4540 Moderate persistent asthma, uncomplicated: Secondary | ICD-10-CM

## 2024-10-15 DIAGNOSIS — T7800XD Anaphylactic reaction due to unspecified food, subsequent encounter: Secondary | ICD-10-CM

## 2024-10-15 DIAGNOSIS — J3089 Other allergic rhinitis: Secondary | ICD-10-CM

## 2024-10-15 DIAGNOSIS — K219 Gastro-esophageal reflux disease without esophagitis: Secondary | ICD-10-CM

## 2024-10-15 DIAGNOSIS — J302 Other seasonal allergic rhinitis: Secondary | ICD-10-CM

## 2024-10-15 DIAGNOSIS — H1013 Acute atopic conjunctivitis, bilateral: Secondary | ICD-10-CM

## 2024-10-15 DIAGNOSIS — H1010 Acute atopic conjunctivitis, unspecified eye: Secondary | ICD-10-CM

## 2024-10-15 MED ORDER — EPINEPHRINE 0.3 MG/0.3ML IJ SOAJ
INTRAMUSCULAR | 2 refills | Status: AC
  Filled 2024-10-15: qty 2, 30d supply, fill #0

## 2024-10-15 MED ORDER — CEFDINIR 300 MG PO CAPS
300.0000 mg | ORAL_CAPSULE | Freq: Two times a day (BID) | ORAL | 0 refills | Status: AC
  Filled 2024-10-15: qty 20, 10d supply, fill #0

## 2024-10-15 MED ORDER — ALBUTEROL SULFATE HFA 108 (90 BASE) MCG/ACT IN AERS
2.0000 | INHALATION_SPRAY | Freq: Four times a day (QID) | RESPIRATORY_TRACT | 1 refills | Status: AC | PRN
  Filled 2024-10-15: qty 6.7, 25d supply, fill #0

## 2024-10-15 NOTE — Progress Notes (Signed)
 "  FOLLOW UP  Date of Service/Encounter:  10/15/24   Assessment:   Chronic rhinosinusitis (grasses, weeds, trees, indoor and outdoor molds, cat, dog, dust mite) - via blood work in March 2021   Lichen plaus of the esophagus    Moderate persistent asthma - with worsening symptoms since contracting COVID19 earlier in the year   Rheumatoid arthritis - on MTX, folic acid , and Plaquenil    Difficulty using the Dupixent  autoinjector due to coexisting RA (receives injections in our office)   History of migraines   Xerostomia - with positive ENA RNP Ab   Long COVID syndrome   Monoclonal gammopathy of unknown significance - followed by Dr. Loretha   Newly diagnosed lichen planus  Plan/Recommendations:   1. Moderate persistent asthma  - Lung testing looks amazing today.  - We are not going to make any changes today. - Daily controller medication(s): montelukast  10 mg once a day and Dupixent  300mg  every 2 weeks  - Prior to physical activity: albuterol  2 puffs 10-15 minutes before physical activity - Rescue medications: albuterol  4 puffs every 4-6 hours as needed or albuterol  nebulizer one vial puffs every 4-6 hours as needed  - Asthma control goals:  * Full participation in all desired activities (may need albuterol  before activity) * Albuterol  use two time or less a week on average (not counting use with activity) * Cough interfering with sleep two time or less a month * Oral steroids no more than once a year * No hospitalizations  2. LPRD (laryngopharyngeal reflux disease) - Continue dietary and lifestyle modifications as listed below - Continue omeprazole  daily - Continue with Pepcid  (famotidine ) 40mg  twice daily.   3. Seasonal and perennial allergic rhinitis (cats, dog, outdoor molds, trees) - with overlying chronic sinusitis - Continue with alternating antihistamines daily (change every 2-3 months). - Restart Atrovent  (ipratropium) one spray per nostril daily to help with  runny nose and mucous production.  - Continue with Ayr nasal saline gel.  - We are adding on cefdinir  300mg  twice daily for ten days to try to knock out this sinus infection.  - Call/MyChart with updates.   4. Food allergies - Continue to avoid peanuts and tree nuts. - Repeat labs sent today to see if we can introduce these.   5. 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 - Continue with the hydration. - Use lubricating eye drops as needed.  - Hopefully they can iprove the symptoms with the prednisone  and other immunosuppressants.   6. Return in about 6 months (around 04/14/2025). You can have the follow up appointment with Dr. Iva or a Nurse Practicioner (our Nurse Practitioners are excellent and always have Physician oversight!).    Subjective:   Halimah AILY TZENG is a 59 y.o. female presenting today for follow up of  Chief Complaint  Patient presents with   Follow-up   Asthma    Is doing fine    Chennel ALVITA FANA has a history of the following: Patient Active Problem List   Diagnosis Date Noted   Witnessed episode of apnea 01/07/2022   Viral illness 09/08/2021   Anaphylactic shock due to adverse food reaction 02/01/2020   Moderate persistent asthma without complication 11/18/2019   Complication of anesthesia    Fibromyalgia 02/14/2018   History of gastroesophageal reflux (GERD) 02/14/2018   History of asthma 02/14/2018   Rheumatoid arthritis involving multiple sites with positive rheumatoid factor (HCC) 11/05/2016   High risk medication use 11/05/2016  LPRD (laryngopharyngeal reflux disease) 09/20/2015   Seasonal and perennial allergic rhinoconjunctivitis 09/20/2015   Acute sinusitis 07/29/2015   S/P total hysterectomy 05/25/2015    History obtained from: chart review and patient.  Discussed the use of AI scribe software for clinical note transcription with the patient and/or guardian, who gave verbal consent to proceed.  Soriya is a 59 y.o.  female presenting for a follow up visit.  She was last seen in July 2025.  At that time, she was continued on montelukast  as well as Dupixent  every 2 weeks.  For her reflux, she continue with omeprazole  daily as well as famotidine .  Allergic rhinitis was under good control with alternating antihistamines as well as Astelin .  She continued to avoid peanuts and tree nuts.  Since last visit, she has done well.   Kierra MYREL RAPPLEYE is a 59 year old female with chronic sinus issues who presents with persistent drainage and cough. She experiences significant drainage affecting her ears and throat, leading to nighttime coughing. Despite completing a course of azithromycin  about a week ago for a previously diagnosed sinus infection, her symptoms persist. The drainage also affects her eyes and ears, with a sensation of fluid in her ears. She cannot use nasal sprays due to previous episodes of epistaxis and currently uses an air gel for nasal dryness.  She is on Dupixent  for breathing issues and has not used her rescue inhaler recently. She has a history of esophageal issues, having undergone two EGDs and a throat biopsy in December. She reports a lack of platelets and dryness in her throat, which she attributes to her current prednisone  use. She is on a prednisone  dose of 5 mg for inflammation related to her throat issues, increased from 2.5 mg. She experiences significant dryness in her mouth and eyes, and frequent urination, which she associates with her prednisone  use.  She has a history of allergies, including cats, dogs, molds, and trees, and has previously undergone allergy testing. She cannot tolerate Augmentin  due to gastrointestinal side effects and has used other antibiotics like cefdinir  in the past. She is not allergic to any antibiotics but finds Augmentin  too strong for her stomach.  She is caring for her mother and her husband, who is seeking disability due to an injury. This injury was 3 years ago.  Marriage was on the rocks, but this seems to be better today.     Otherwise, there have been no changes to her past medical history, surgical history, family history, or social history.    Review of systems otherwise negative other than that mentioned in the HPI.    Objective:   Blood pressure 104/70, pulse 80, temperature 98 F (36.7 C), temperature source Temporal, resp. rate 16, height 5' 2 (1.575 m), weight 176 lb 12.8 oz (80.2 kg), last menstrual period 05/19/2015, SpO2 98%. Body mass index is 32.34 kg/m.    Physical Exam Vitals reviewed.  Constitutional:      Appearance: Normal appearance. She is well-developed.     Comments: Smiling. Seems more upbeat today.   HENT:     Head: Normocephalic and atraumatic.     Right Ear: Tympanic membrane, ear canal and external ear normal.     Left Ear: Tympanic membrane, ear canal and external ear normal.     Nose: No nasal deformity, septal deviation, mucosal edema or rhinorrhea.     Right Turbinates: Enlarged, swollen and pale.     Left Turbinates: Enlarged, swollen and pale.  Right Sinus: No maxillary sinus tenderness or frontal sinus tenderness.     Left Sinus: No maxillary sinus tenderness or frontal sinus tenderness.     Comments: No nasal polyps noted.     Mouth/Throat:     Lips: Pink.     Mouth: Mucous membranes are moist. Mucous membranes are not pale and not dry.     Pharynx: Uvula midline. Posterior oropharyngeal erythema present. No pharyngeal swelling or oropharyngeal exudate.     Comments: Cobblestoning present in the posterior oropharynx.  Eyes:     General: Lids are normal. Allergic shiner present.        Right eye: No discharge.        Left eye: No discharge.     Conjunctiva/sclera: Conjunctivae normal.     Right eye: Right conjunctiva is not injected. No chemosis.    Left eye: Left conjunctiva is not injected. No chemosis.    Pupils: Pupils are equal, round, and reactive to light.  Cardiovascular:     Rate  and Rhythm: Normal rate and regular rhythm.     Heart sounds: Normal heart sounds.  Pulmonary:     Effort: Pulmonary effort is normal. No tachypnea, accessory muscle usage or respiratory distress.     Breath sounds: Normal breath sounds. No wheezing, rhonchi or rales.     Comments: Decreased air movement in the bases. Hoarse voice.  Chest:     Chest wall: No tenderness.  Lymphadenopathy:     Cervical: No cervical adenopathy.  Skin:    Coloration: Skin is not pale.     Findings: No abrasion, erythema, petechiae or rash. Rash is not papular, urticarial or vesicular.  Neurological:     Mental Status: She is alert.  Psychiatric:        Behavior: Behavior is cooperative.      Diagnostic studies:    Spirometry: results normal (FEV1: 1.73/86%, FVC: 2.15/85%, FEV1/FVC: 80%).    Spirometry consistent with normal pattern.    Allergy Studies: none       Marty Shaggy, MD  [facility]        "

## 2024-10-15 NOTE — Patient Instructions (Addendum)
 1. Moderate persistent asthma  - Lung testing looks amazing today.  - We are not going to make any changes today. - Daily controller medication(s): montelukast  10 mg once a day and Dupixent  300mg  every 2 weeks  - Prior to physical activity: albuterol  2 puffs 10-15 minutes before physical activity - Rescue medications: albuterol  4 puffs every 4-6 hours as needed or albuterol  nebulizer one vial puffs every 4-6 hours as needed  - Asthma control goals:  * Full participation in all desired activities (may need albuterol  before activity) * Albuterol  use two time or less a week on average (not counting use with activity) * Cough interfering with sleep two time or less a month * Oral steroids no more than once a year * No hospitalizations  2. LPRD (laryngopharyngeal reflux disease) - Continue dietary and lifestyle modifications as listed below - Continue omeprazole  daily - Continue with Pepcid  (famotidine ) 40mg  twice daily.   3. Seasonal and perennial allergic rhinitis (cats, dog, outdoor molds, trees) - with overlying chronic sinusitis - Continue with alternating antihistamines daily (change every 2-3 months). - Restart Atrovent  (ipratropium) one spray per nostril daily to help with runny nose and mucous production.  - Continue with Ayr nasal saline gel.  - We are adding on cefdinir  300mg  twice daily for ten days to try to knock out this sinus infection.  - Call/MyChart with updates.   4. Food allergies - Continue to avoid peanuts and tree nuts. - Repeat labs sent today to see if we can introduce these.   5. 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 - Continue with the hydration. - Use lubricating eye drops as needed.  - Hopefully they can iprove the symptoms with the prednisone  and other immunosuppressants.   6. Return in about 6 months (around 04/14/2025). You can have the follow up appointment with Dr. Iva or a Nurse Practicioner (our Nurse Practitioners are  excellent and always have Physician oversight!).    Please inform us  of any Emergency Department visits, hospitalizations, or changes in symptoms. Call us  before going to the ED for breathing or allergy symptoms since we might be able to fit you in for a sick visit. Feel free to contact us  anytime with any questions, problems, or concerns.  It was a pleasure to see you again today!  Websites that have reliable patient information: 1. American Academy of Asthma, Allergy, and Immunology: www.aaaai.org 2. Food Allergy Research and Education (FARE): foodallergy.org 3. Mothers of Asthmatics: http://www.asthmacommunitynetwork.org 4. American College of Allergy, Asthma, and Immunology: www.acaai.org      Like us  on [employer] and Instagram for our latest updates!      A healthy democracy works best when [employer] participate! Make sure you are registered to vote! If you have moved or changed any of your contact information, you will need to get this updated before voting! Scan the QR codes below to learn more!

## 2024-10-19 LAB — IGE NUT PROF. W/COMPONENT RFLX
F017-IgE Hazelnut (Filbert): <0.1 kU/L
F018-IgE Brazil Nut: <0.1 kU/L
F020-IgE Almond: <0.1 kU/L
F202-IgE Cashew Nut: <0.1 kU/L
F203-IgE Pistachio Nut: <0.1 kU/L
F256-IgE Walnut: <0.1 kU/L
Macadamia Nut, IgE: <0.1 kU/L
Peanut, IgE: <0.1 kU/L
Pecan Nut IgE: <0.1 kU/L

## 2024-10-20 ENCOUNTER — Ambulatory Visit: Payer: Self-pay | Admitting: Allergy & Immunology

## 2024-10-29 ENCOUNTER — Ambulatory Visit

## 2025-01-07 ENCOUNTER — Ambulatory Visit: Admitting: Physician Assistant

## 2025-04-15 ENCOUNTER — Ambulatory Visit: Admitting: Allergy & Immunology
